# Patient Record
Sex: Female | Born: 1965 | Hispanic: No | Marital: Married | Smoking: Current every day smoker
Health system: Southern US, Community
[De-identification: ages and names within clinical notes are randomized; demographics above are authoritative.]

## PROBLEM LIST (undated history)

## (undated) DIAGNOSIS — D649 Anemia, unspecified: Secondary | ICD-10-CM

## (undated) DIAGNOSIS — N289 Disorder of kidney and ureter, unspecified: Secondary | ICD-10-CM

## (undated) DIAGNOSIS — Z87442 Personal history of urinary calculi: Secondary | ICD-10-CM

## (undated) DIAGNOSIS — R519 Headache, unspecified: Secondary | ICD-10-CM

## (undated) DIAGNOSIS — M797 Fibromyalgia: Secondary | ICD-10-CM

## (undated) DIAGNOSIS — T148XXA Other injury of unspecified body region, initial encounter: Secondary | ICD-10-CM

## (undated) DIAGNOSIS — Z8489 Family history of other specified conditions: Secondary | ICD-10-CM

## (undated) DIAGNOSIS — Z9889 Other specified postprocedural states: Secondary | ICD-10-CM

## (undated) DIAGNOSIS — E785 Hyperlipidemia, unspecified: Secondary | ICD-10-CM

## (undated) DIAGNOSIS — Z87440 Personal history of urinary (tract) infections: Secondary | ICD-10-CM

## (undated) DIAGNOSIS — J329 Chronic sinusitis, unspecified: Secondary | ICD-10-CM

## (undated) DIAGNOSIS — J189 Pneumonia, unspecified organism: Secondary | ICD-10-CM

## (undated) DIAGNOSIS — R51 Headache: Secondary | ICD-10-CM

## (undated) DIAGNOSIS — D499 Neoplasm of unspecified behavior of unspecified site: Secondary | ICD-10-CM

## (undated) DIAGNOSIS — G8929 Other chronic pain: Secondary | ICD-10-CM

## (undated) DIAGNOSIS — M549 Dorsalgia, unspecified: Secondary | ICD-10-CM

## (undated) DIAGNOSIS — K219 Gastro-esophageal reflux disease without esophagitis: Secondary | ICD-10-CM

## (undated) DIAGNOSIS — J45909 Unspecified asthma, uncomplicated: Secondary | ICD-10-CM

## (undated) DIAGNOSIS — F419 Anxiety disorder, unspecified: Secondary | ICD-10-CM

## (undated) DIAGNOSIS — K429 Umbilical hernia without obstruction or gangrene: Secondary | ICD-10-CM

## (undated) DIAGNOSIS — M1990 Unspecified osteoarthritis, unspecified site: Secondary | ICD-10-CM

## (undated) DIAGNOSIS — K589 Irritable bowel syndrome without diarrhea: Secondary | ICD-10-CM

## (undated) DIAGNOSIS — R112 Nausea with vomiting, unspecified: Secondary | ICD-10-CM

## (undated) DIAGNOSIS — R0600 Dyspnea, unspecified: Secondary | ICD-10-CM

## (undated) DIAGNOSIS — I10 Essential (primary) hypertension: Secondary | ICD-10-CM

## (undated) HISTORY — PX: CARPAL TUNNEL RELEASE: SHX101

## (undated) HISTORY — PX: CYSTECTOMY: SUR359

## (undated) HISTORY — PX: ABDOMINAL HYSTERECTOMY: SHX81

## (undated) HISTORY — PX: MULTIPLE TOOTH EXTRACTIONS: SHX2053

## (undated) HISTORY — PX: CHOLECYSTECTOMY: SHX55

## (undated) HISTORY — PX: LITHOTRIPSY: SUR834

## (undated) HISTORY — DX: Umbilical hernia without obstruction or gangrene: K42.9

## (undated) HISTORY — DX: Dorsalgia, unspecified: M54.9

---

## 1997-11-12 ENCOUNTER — Ambulatory Visit (HOSPITAL_COMMUNITY): Admission: RE | Admit: 1997-11-12 | Discharge: 1997-11-12 | Payer: Self-pay | Admitting: Urology

## 1999-02-04 ENCOUNTER — Emergency Department (HOSPITAL_COMMUNITY): Admission: EM | Admit: 1999-02-04 | Discharge: 1999-02-04 | Payer: Self-pay | Admitting: Emergency Medicine

## 1999-06-02 ENCOUNTER — Encounter: Admission: RE | Admit: 1999-06-02 | Discharge: 1999-08-31 | Payer: Self-pay | Admitting: Orthopedic Surgery

## 1999-09-03 ENCOUNTER — Emergency Department (HOSPITAL_COMMUNITY): Admission: EM | Admit: 1999-09-03 | Discharge: 1999-09-03 | Payer: Self-pay | Admitting: Emergency Medicine

## 1999-09-13 ENCOUNTER — Emergency Department (HOSPITAL_COMMUNITY): Admission: EM | Admit: 1999-09-13 | Discharge: 1999-09-13 | Payer: Self-pay | Admitting: Emergency Medicine

## 1999-09-13 ENCOUNTER — Encounter: Payer: Self-pay | Admitting: Emergency Medicine

## 1999-12-30 ENCOUNTER — Emergency Department (HOSPITAL_COMMUNITY): Admission: EM | Admit: 1999-12-30 | Discharge: 1999-12-30 | Payer: Self-pay

## 2000-07-07 ENCOUNTER — Emergency Department (HOSPITAL_COMMUNITY): Admission: EM | Admit: 2000-07-07 | Discharge: 2000-07-07 | Payer: Self-pay

## 2000-09-30 ENCOUNTER — Emergency Department (HOSPITAL_COMMUNITY): Admission: EM | Admit: 2000-09-30 | Discharge: 2000-09-30 | Payer: Self-pay | Admitting: *Deleted

## 2000-09-30 ENCOUNTER — Encounter: Payer: Self-pay | Admitting: Emergency Medicine

## 2001-01-23 ENCOUNTER — Emergency Department (HOSPITAL_COMMUNITY): Admission: EM | Admit: 2001-01-23 | Discharge: 2001-01-23 | Payer: Self-pay

## 2001-02-01 ENCOUNTER — Encounter: Payer: Self-pay | Admitting: Emergency Medicine

## 2001-02-01 ENCOUNTER — Emergency Department (HOSPITAL_COMMUNITY): Admission: EM | Admit: 2001-02-01 | Discharge: 2001-02-01 | Payer: Self-pay | Admitting: Emergency Medicine

## 2002-01-26 ENCOUNTER — Emergency Department (HOSPITAL_COMMUNITY): Admission: EM | Admit: 2002-01-26 | Discharge: 2002-01-26 | Payer: Self-pay | Admitting: *Deleted

## 2002-01-31 ENCOUNTER — Encounter: Admission: RE | Admit: 2002-01-31 | Discharge: 2002-01-31 | Payer: Self-pay | Admitting: *Deleted

## 2002-02-01 ENCOUNTER — Ambulatory Visit (HOSPITAL_COMMUNITY): Admission: RE | Admit: 2002-02-01 | Discharge: 2002-02-01 | Payer: Self-pay | Admitting: *Deleted

## 2002-02-03 ENCOUNTER — Ambulatory Visit (HOSPITAL_COMMUNITY): Admission: RE | Admit: 2002-02-03 | Discharge: 2002-02-03 | Payer: Self-pay

## 2002-02-03 ENCOUNTER — Encounter: Payer: Self-pay | Admitting: *Deleted

## 2002-02-21 ENCOUNTER — Encounter: Admission: RE | Admit: 2002-02-21 | Discharge: 2002-02-21 | Payer: Self-pay | Admitting: Internal Medicine

## 2002-02-27 ENCOUNTER — Encounter: Admission: RE | Admit: 2002-02-27 | Discharge: 2002-05-28 | Payer: Self-pay | Admitting: *Deleted

## 2002-03-01 ENCOUNTER — Encounter: Admission: RE | Admit: 2002-03-01 | Discharge: 2002-03-01 | Payer: Self-pay | Admitting: Internal Medicine

## 2002-03-10 ENCOUNTER — Observation Stay (HOSPITAL_COMMUNITY): Admission: RE | Admit: 2002-03-10 | Discharge: 2002-03-11 | Payer: Self-pay

## 2002-03-10 ENCOUNTER — Encounter (INDEPENDENT_AMBULATORY_CARE_PROVIDER_SITE_OTHER): Payer: Self-pay | Admitting: Specialist

## 2002-03-15 ENCOUNTER — Ambulatory Visit (HOSPITAL_COMMUNITY): Admission: RE | Admit: 2002-03-15 | Discharge: 2002-03-15 | Payer: Self-pay | Admitting: Internal Medicine

## 2002-03-15 ENCOUNTER — Encounter: Admission: RE | Admit: 2002-03-15 | Discharge: 2002-03-15 | Payer: Self-pay | Admitting: Internal Medicine

## 2002-03-22 ENCOUNTER — Emergency Department (HOSPITAL_COMMUNITY): Admission: EM | Admit: 2002-03-22 | Discharge: 2002-03-23 | Payer: Self-pay | Admitting: Emergency Medicine

## 2002-03-22 ENCOUNTER — Encounter: Payer: Self-pay | Admitting: Emergency Medicine

## 2002-03-23 ENCOUNTER — Encounter: Admission: RE | Admit: 2002-03-23 | Discharge: 2002-03-23 | Payer: Self-pay | Admitting: Internal Medicine

## 2002-03-26 ENCOUNTER — Emergency Department (HOSPITAL_COMMUNITY): Admission: EM | Admit: 2002-03-26 | Discharge: 2002-03-27 | Payer: Self-pay | Admitting: Emergency Medicine

## 2002-03-27 ENCOUNTER — Encounter: Payer: Self-pay | Admitting: Emergency Medicine

## 2002-04-22 ENCOUNTER — Emergency Department (HOSPITAL_COMMUNITY): Admission: EM | Admit: 2002-04-22 | Discharge: 2002-04-23 | Payer: Self-pay | Admitting: Emergency Medicine

## 2002-04-23 ENCOUNTER — Encounter: Payer: Self-pay | Admitting: Emergency Medicine

## 2002-04-24 ENCOUNTER — Emergency Department (HOSPITAL_COMMUNITY): Admission: EM | Admit: 2002-04-24 | Discharge: 2002-04-25 | Payer: Self-pay | Admitting: Emergency Medicine

## 2002-04-25 ENCOUNTER — Encounter: Payer: Self-pay | Admitting: Emergency Medicine

## 2002-04-26 ENCOUNTER — Inpatient Hospital Stay (HOSPITAL_COMMUNITY): Admission: AD | Admit: 2002-04-26 | Discharge: 2002-04-30 | Payer: Self-pay | Admitting: Internal Medicine

## 2002-04-26 ENCOUNTER — Ambulatory Visit (HOSPITAL_COMMUNITY): Admission: RE | Admit: 2002-04-26 | Discharge: 2002-04-26 | Payer: Self-pay | Admitting: *Deleted

## 2002-04-26 ENCOUNTER — Encounter: Admission: RE | Admit: 2002-04-26 | Discharge: 2002-04-26 | Payer: Self-pay | Admitting: Internal Medicine

## 2002-04-26 ENCOUNTER — Encounter: Payer: Self-pay | Admitting: *Deleted

## 2002-05-03 ENCOUNTER — Inpatient Hospital Stay (HOSPITAL_COMMUNITY): Admission: EM | Admit: 2002-05-03 | Discharge: 2002-05-06 | Payer: Self-pay | Admitting: Emergency Medicine

## 2002-05-03 ENCOUNTER — Encounter: Payer: Self-pay | Admitting: Internal Medicine

## 2002-05-05 ENCOUNTER — Encounter: Payer: Self-pay | Admitting: Internal Medicine

## 2002-05-11 ENCOUNTER — Encounter: Admission: RE | Admit: 2002-05-11 | Discharge: 2002-05-11 | Payer: Self-pay | Admitting: Internal Medicine

## 2002-06-08 ENCOUNTER — Encounter: Admission: RE | Admit: 2002-06-08 | Discharge: 2002-06-08 | Payer: Self-pay | Admitting: Internal Medicine

## 2002-06-12 ENCOUNTER — Encounter: Admission: RE | Admit: 2002-06-12 | Discharge: 2002-06-12 | Payer: Self-pay | Admitting: Internal Medicine

## 2002-06-12 ENCOUNTER — Inpatient Hospital Stay (HOSPITAL_COMMUNITY): Admission: AD | Admit: 2002-06-12 | Discharge: 2002-06-13 | Payer: Self-pay | Admitting: Internal Medicine

## 2002-06-12 ENCOUNTER — Ambulatory Visit (HOSPITAL_COMMUNITY): Admission: RE | Admit: 2002-06-12 | Discharge: 2002-06-12 | Payer: Self-pay | Admitting: Internal Medicine

## 2002-06-12 ENCOUNTER — Encounter: Payer: Self-pay | Admitting: Internal Medicine

## 2002-06-15 ENCOUNTER — Inpatient Hospital Stay (HOSPITAL_COMMUNITY): Admission: RE | Admit: 2002-06-15 | Discharge: 2002-06-15 | Payer: Self-pay | Admitting: Internal Medicine

## 2002-06-15 ENCOUNTER — Encounter: Admission: RE | Admit: 2002-06-15 | Discharge: 2002-06-15 | Payer: Self-pay | Admitting: Internal Medicine

## 2002-06-15 ENCOUNTER — Encounter: Payer: Self-pay | Admitting: Internal Medicine

## 2002-07-15 ENCOUNTER — Emergency Department (HOSPITAL_COMMUNITY): Admission: EM | Admit: 2002-07-15 | Discharge: 2002-07-15 | Payer: Self-pay | Admitting: Emergency Medicine

## 2002-07-16 ENCOUNTER — Encounter: Payer: Self-pay | Admitting: Emergency Medicine

## 2002-07-17 ENCOUNTER — Encounter: Payer: Self-pay | Admitting: Urology

## 2002-07-17 ENCOUNTER — Inpatient Hospital Stay (HOSPITAL_COMMUNITY): Admission: EM | Admit: 2002-07-17 | Discharge: 2002-07-20 | Payer: Self-pay | Admitting: Urology

## 2002-07-21 ENCOUNTER — Ambulatory Visit (HOSPITAL_COMMUNITY): Admission: RE | Admit: 2002-07-21 | Discharge: 2002-07-21 | Payer: Self-pay | Admitting: Internal Medicine

## 2002-07-21 ENCOUNTER — Inpatient Hospital Stay (HOSPITAL_COMMUNITY): Admission: EM | Admit: 2002-07-21 | Discharge: 2002-07-29 | Payer: Self-pay | Admitting: Internal Medicine

## 2002-07-21 ENCOUNTER — Encounter: Admission: RE | Admit: 2002-07-21 | Discharge: 2002-07-21 | Payer: Self-pay | Admitting: Internal Medicine

## 2002-07-23 ENCOUNTER — Encounter: Payer: Self-pay | Admitting: Internal Medicine

## 2002-07-26 ENCOUNTER — Encounter: Payer: Self-pay | Admitting: Internal Medicine

## 2002-07-28 ENCOUNTER — Encounter: Payer: Self-pay | Admitting: Internal Medicine

## 2002-07-29 ENCOUNTER — Encounter: Payer: Self-pay | Admitting: Internal Medicine

## 2002-08-10 DIAGNOSIS — R112 Nausea with vomiting, unspecified: Secondary | ICD-10-CM

## 2002-08-10 DIAGNOSIS — Z9889 Other specified postprocedural states: Secondary | ICD-10-CM

## 2002-08-10 HISTORY — DX: Other specified postprocedural states: Z98.890

## 2002-08-10 HISTORY — DX: Nausea with vomiting, unspecified: R11.2

## 2002-08-12 ENCOUNTER — Emergency Department (HOSPITAL_COMMUNITY): Admission: EM | Admit: 2002-08-12 | Discharge: 2002-08-13 | Payer: Self-pay | Admitting: Emergency Medicine

## 2002-08-13 ENCOUNTER — Encounter: Payer: Self-pay | Admitting: Emergency Medicine

## 2002-08-17 ENCOUNTER — Inpatient Hospital Stay (HOSPITAL_COMMUNITY): Admission: EM | Admit: 2002-08-17 | Discharge: 2002-08-21 | Payer: Self-pay | Admitting: Emergency Medicine

## 2002-08-22 ENCOUNTER — Encounter: Payer: Self-pay | Admitting: Emergency Medicine

## 2002-08-22 ENCOUNTER — Emergency Department (HOSPITAL_COMMUNITY): Admission: EM | Admit: 2002-08-22 | Discharge: 2002-08-23 | Payer: Self-pay | Admitting: Emergency Medicine

## 2002-08-25 ENCOUNTER — Encounter: Payer: Self-pay | Admitting: Emergency Medicine

## 2002-08-25 ENCOUNTER — Emergency Department (HOSPITAL_COMMUNITY): Admission: EM | Admit: 2002-08-25 | Discharge: 2002-08-25 | Payer: Self-pay | Admitting: Emergency Medicine

## 2002-09-10 ENCOUNTER — Emergency Department (HOSPITAL_COMMUNITY): Admission: EM | Admit: 2002-09-10 | Discharge: 2002-09-10 | Payer: Self-pay | Admitting: Emergency Medicine

## 2002-09-10 ENCOUNTER — Encounter: Payer: Self-pay | Admitting: Emergency Medicine

## 2002-09-12 ENCOUNTER — Encounter: Admission: RE | Admit: 2002-09-12 | Discharge: 2002-09-12 | Payer: Self-pay | Admitting: Obstetrics and Gynecology

## 2002-09-12 ENCOUNTER — Emergency Department (HOSPITAL_COMMUNITY): Admission: EM | Admit: 2002-09-12 | Discharge: 2002-09-13 | Payer: Self-pay | Admitting: Emergency Medicine

## 2002-10-04 ENCOUNTER — Encounter: Payer: Self-pay | Admitting: Obstetrics & Gynecology

## 2002-10-04 ENCOUNTER — Ambulatory Visit (HOSPITAL_COMMUNITY): Admission: RE | Admit: 2002-10-04 | Discharge: 2002-10-04 | Payer: Self-pay | Admitting: Obstetrics & Gynecology

## 2002-12-02 ENCOUNTER — Emergency Department (HOSPITAL_COMMUNITY): Admission: EM | Admit: 2002-12-02 | Discharge: 2002-12-02 | Payer: Self-pay | Admitting: Emergency Medicine

## 2002-12-12 ENCOUNTER — Inpatient Hospital Stay (HOSPITAL_COMMUNITY): Admission: AD | Admit: 2002-12-12 | Discharge: 2002-12-12 | Payer: Self-pay | Admitting: Obstetrics & Gynecology

## 2002-12-12 ENCOUNTER — Encounter: Payer: Self-pay | Admitting: Obstetrics & Gynecology

## 2002-12-13 ENCOUNTER — Encounter: Payer: Self-pay | Admitting: Emergency Medicine

## 2002-12-13 ENCOUNTER — Emergency Department (HOSPITAL_COMMUNITY): Admission: EM | Admit: 2002-12-13 | Discharge: 2002-12-13 | Payer: Self-pay | Admitting: Emergency Medicine

## 2002-12-14 ENCOUNTER — Emergency Department (HOSPITAL_COMMUNITY): Admission: EM | Admit: 2002-12-14 | Discharge: 2002-12-14 | Payer: Self-pay | Admitting: Emergency Medicine

## 2003-04-01 ENCOUNTER — Emergency Department (HOSPITAL_COMMUNITY): Admission: EM | Admit: 2003-04-01 | Discharge: 2003-04-01 | Payer: Self-pay | Admitting: Emergency Medicine

## 2003-05-09 ENCOUNTER — Emergency Department (HOSPITAL_COMMUNITY): Admission: EM | Admit: 2003-05-09 | Discharge: 2003-05-09 | Payer: Self-pay | Admitting: Emergency Medicine

## 2003-05-09 ENCOUNTER — Encounter: Payer: Self-pay | Admitting: Emergency Medicine

## 2003-05-15 ENCOUNTER — Encounter: Payer: Self-pay | Admitting: Obstetrics

## 2003-05-15 ENCOUNTER — Inpatient Hospital Stay (HOSPITAL_COMMUNITY): Admission: AD | Admit: 2003-05-15 | Discharge: 2003-05-17 | Payer: Self-pay | Admitting: Obstetrics

## 2003-05-18 ENCOUNTER — Emergency Department (HOSPITAL_COMMUNITY): Admission: EM | Admit: 2003-05-18 | Discharge: 2003-05-18 | Payer: Self-pay | Admitting: Emergency Medicine

## 2003-05-29 ENCOUNTER — Emergency Department (HOSPITAL_COMMUNITY): Admission: EM | Admit: 2003-05-29 | Discharge: 2003-05-30 | Payer: Self-pay | Admitting: Emergency Medicine

## 2003-05-31 ENCOUNTER — Encounter: Payer: Self-pay | Admitting: Emergency Medicine

## 2003-05-31 ENCOUNTER — Emergency Department (HOSPITAL_COMMUNITY): Admission: EM | Admit: 2003-05-31 | Discharge: 2003-05-31 | Payer: Self-pay | Admitting: Emergency Medicine

## 2003-06-01 ENCOUNTER — Emergency Department (HOSPITAL_COMMUNITY): Admission: EM | Admit: 2003-06-01 | Discharge: 2003-06-01 | Payer: Self-pay | Admitting: Emergency Medicine

## 2003-06-01 ENCOUNTER — Encounter: Payer: Self-pay | Admitting: Emergency Medicine

## 2003-06-03 ENCOUNTER — Emergency Department (HOSPITAL_COMMUNITY): Admission: EM | Admit: 2003-06-03 | Discharge: 2003-06-03 | Payer: Self-pay | Admitting: Emergency Medicine

## 2003-06-03 ENCOUNTER — Encounter: Payer: Self-pay | Admitting: Emergency Medicine

## 2003-06-05 ENCOUNTER — Inpatient Hospital Stay (HOSPITAL_COMMUNITY): Admission: EM | Admit: 2003-06-05 | Discharge: 2003-06-09 | Payer: Self-pay | Admitting: Emergency Medicine

## 2003-07-28 ENCOUNTER — Emergency Department (HOSPITAL_COMMUNITY): Admission: EM | Admit: 2003-07-28 | Discharge: 2003-07-28 | Payer: Self-pay | Admitting: Emergency Medicine

## 2003-09-15 ENCOUNTER — Emergency Department (HOSPITAL_COMMUNITY): Admission: EM | Admit: 2003-09-15 | Discharge: 2003-09-15 | Payer: Self-pay | Admitting: Emergency Medicine

## 2003-10-09 ENCOUNTER — Emergency Department (HOSPITAL_COMMUNITY): Admission: EM | Admit: 2003-10-09 | Discharge: 2003-10-09 | Payer: Self-pay | Admitting: Emergency Medicine

## 2003-10-10 ENCOUNTER — Emergency Department (HOSPITAL_COMMUNITY): Admission: EM | Admit: 2003-10-10 | Discharge: 2003-10-10 | Payer: Self-pay | Admitting: Emergency Medicine

## 2003-10-11 ENCOUNTER — Emergency Department (HOSPITAL_COMMUNITY): Admission: EM | Admit: 2003-10-11 | Discharge: 2003-10-11 | Payer: Self-pay | Admitting: Emergency Medicine

## 2003-10-12 ENCOUNTER — Emergency Department (HOSPITAL_COMMUNITY): Admission: EM | Admit: 2003-10-12 | Discharge: 2003-10-13 | Payer: Self-pay | Admitting: *Deleted

## 2003-10-13 ENCOUNTER — Emergency Department (HOSPITAL_COMMUNITY): Admission: EM | Admit: 2003-10-13 | Discharge: 2003-10-13 | Payer: Self-pay | Admitting: Emergency Medicine

## 2003-10-14 ENCOUNTER — Inpatient Hospital Stay (HOSPITAL_COMMUNITY): Admission: EM | Admit: 2003-10-14 | Discharge: 2003-10-18 | Payer: Self-pay | Admitting: Emergency Medicine

## 2003-10-23 ENCOUNTER — Emergency Department (HOSPITAL_COMMUNITY): Admission: EM | Admit: 2003-10-23 | Discharge: 2003-10-23 | Payer: Self-pay | Admitting: Emergency Medicine

## 2003-10-24 ENCOUNTER — Emergency Department (HOSPITAL_COMMUNITY): Admission: EM | Admit: 2003-10-24 | Discharge: 2003-10-25 | Payer: Self-pay | Admitting: Emergency Medicine

## 2003-11-24 ENCOUNTER — Emergency Department (HOSPITAL_COMMUNITY): Admission: EM | Admit: 2003-11-24 | Discharge: 2003-11-24 | Payer: Self-pay | Admitting: Emergency Medicine

## 2003-11-27 ENCOUNTER — Inpatient Hospital Stay (HOSPITAL_COMMUNITY): Admission: AD | Admit: 2003-11-27 | Discharge: 2003-11-27 | Payer: Self-pay | Admitting: Obstetrics & Gynecology

## 2003-12-17 ENCOUNTER — Encounter: Admission: RE | Admit: 2003-12-17 | Discharge: 2003-12-17 | Payer: Self-pay | Admitting: Nephrology

## 2004-04-28 ENCOUNTER — Emergency Department (HOSPITAL_COMMUNITY): Admission: EM | Admit: 2004-04-28 | Discharge: 2004-04-29 | Payer: Self-pay | Admitting: Emergency Medicine

## 2004-07-09 ENCOUNTER — Ambulatory Visit (HOSPITAL_COMMUNITY): Admission: RE | Admit: 2004-07-09 | Discharge: 2004-07-09 | Payer: Self-pay | Admitting: Obstetrics & Gynecology

## 2004-08-14 ENCOUNTER — Ambulatory Visit (HOSPITAL_COMMUNITY): Admission: RE | Admit: 2004-08-14 | Discharge: 2004-08-14 | Payer: Self-pay | Admitting: Obstetrics & Gynecology

## 2004-09-13 ENCOUNTER — Emergency Department (HOSPITAL_COMMUNITY): Admission: EM | Admit: 2004-09-13 | Discharge: 2004-09-13 | Payer: Self-pay | Admitting: Emergency Medicine

## 2004-10-20 ENCOUNTER — Emergency Department (HOSPITAL_COMMUNITY): Admission: EM | Admit: 2004-10-20 | Discharge: 2004-10-20 | Payer: Self-pay | Admitting: Emergency Medicine

## 2004-10-30 ENCOUNTER — Inpatient Hospital Stay (HOSPITAL_COMMUNITY): Admission: AD | Admit: 2004-10-30 | Discharge: 2004-11-03 | Payer: Self-pay | Admitting: Obstetrics & Gynecology

## 2004-10-30 ENCOUNTER — Encounter (INDEPENDENT_AMBULATORY_CARE_PROVIDER_SITE_OTHER): Payer: Self-pay | Admitting: *Deleted

## 2004-11-05 ENCOUNTER — Inpatient Hospital Stay (HOSPITAL_COMMUNITY): Admission: AD | Admit: 2004-11-05 | Discharge: 2004-11-20 | Payer: Self-pay | Admitting: Obstetrics & Gynecology

## 2004-11-25 ENCOUNTER — Inpatient Hospital Stay (HOSPITAL_COMMUNITY): Admission: AD | Admit: 2004-11-25 | Discharge: 2004-11-30 | Payer: Self-pay | Admitting: Obstetrics

## 2005-03-14 ENCOUNTER — Emergency Department (HOSPITAL_COMMUNITY): Admission: EM | Admit: 2005-03-14 | Discharge: 2005-03-14 | Payer: Self-pay | Admitting: Emergency Medicine

## 2005-03-15 ENCOUNTER — Emergency Department (HOSPITAL_COMMUNITY): Admission: EM | Admit: 2005-03-15 | Discharge: 2005-03-15 | Payer: Self-pay | Admitting: Emergency Medicine

## 2005-03-16 ENCOUNTER — Inpatient Hospital Stay (HOSPITAL_COMMUNITY): Admission: AD | Admit: 2005-03-16 | Discharge: 2005-03-23 | Payer: Self-pay | Admitting: Internal Medicine

## 2005-05-31 ENCOUNTER — Emergency Department (HOSPITAL_COMMUNITY): Admission: EM | Admit: 2005-05-31 | Discharge: 2005-05-31 | Payer: Self-pay | Admitting: Family Medicine

## 2005-10-03 ENCOUNTER — Emergency Department (HOSPITAL_COMMUNITY): Admission: EM | Admit: 2005-10-03 | Discharge: 2005-10-04 | Payer: Self-pay | Admitting: Emergency Medicine

## 2006-01-07 ENCOUNTER — Encounter: Admission: RE | Admit: 2006-01-07 | Discharge: 2006-01-07 | Payer: Self-pay | Admitting: Sports Medicine

## 2006-02-23 ENCOUNTER — Emergency Department (HOSPITAL_COMMUNITY): Admission: EM | Admit: 2006-02-23 | Discharge: 2006-02-23 | Payer: Self-pay | Admitting: Emergency Medicine

## 2006-05-27 ENCOUNTER — Emergency Department (HOSPITAL_COMMUNITY): Admission: EM | Admit: 2006-05-27 | Discharge: 2006-05-27 | Payer: Self-pay | Admitting: Emergency Medicine

## 2006-10-09 ENCOUNTER — Emergency Department (HOSPITAL_COMMUNITY): Admission: EM | Admit: 2006-10-09 | Discharge: 2006-10-09 | Payer: Self-pay | Admitting: Emergency Medicine

## 2006-10-12 ENCOUNTER — Inpatient Hospital Stay (HOSPITAL_COMMUNITY): Admission: EM | Admit: 2006-10-12 | Discharge: 2006-10-19 | Payer: Self-pay | Admitting: Emergency Medicine

## 2006-10-18 ENCOUNTER — Ambulatory Visit: Payer: Self-pay | Admitting: Vascular Surgery

## 2006-10-18 ENCOUNTER — Ambulatory Visit: Payer: Self-pay | Admitting: Infectious Diseases

## 2007-05-26 ENCOUNTER — Emergency Department (HOSPITAL_COMMUNITY): Admission: EM | Admit: 2007-05-26 | Discharge: 2007-05-26 | Payer: Self-pay | Admitting: Emergency Medicine

## 2007-08-11 ENCOUNTER — Emergency Department (HOSPITAL_COMMUNITY): Admission: EM | Admit: 2007-08-11 | Discharge: 2007-08-11 | Payer: Self-pay | Admitting: Emergency Medicine

## 2007-09-24 ENCOUNTER — Emergency Department (HOSPITAL_COMMUNITY): Admission: EM | Admit: 2007-09-24 | Discharge: 2007-09-24 | Payer: Self-pay | Admitting: Emergency Medicine

## 2008-01-04 ENCOUNTER — Inpatient Hospital Stay (HOSPITAL_COMMUNITY): Admission: EM | Admit: 2008-01-04 | Discharge: 2008-01-07 | Payer: Self-pay | Admitting: Emergency Medicine

## 2008-05-09 ENCOUNTER — Emergency Department (HOSPITAL_COMMUNITY): Admission: EM | Admit: 2008-05-09 | Discharge: 2008-05-09 | Payer: Self-pay | Admitting: Emergency Medicine

## 2008-05-21 ENCOUNTER — Emergency Department (HOSPITAL_COMMUNITY): Admission: EM | Admit: 2008-05-21 | Discharge: 2008-05-21 | Payer: Self-pay | Admitting: Emergency Medicine

## 2008-07-03 ENCOUNTER — Ambulatory Visit (HOSPITAL_COMMUNITY): Admission: RE | Admit: 2008-07-03 | Discharge: 2008-07-03 | Payer: Self-pay | Admitting: Specialist

## 2008-07-10 ENCOUNTER — Emergency Department (HOSPITAL_COMMUNITY): Admission: EM | Admit: 2008-07-10 | Discharge: 2008-07-10 | Payer: Self-pay | Admitting: Emergency Medicine

## 2008-09-20 ENCOUNTER — Ambulatory Visit (HOSPITAL_COMMUNITY): Admission: RE | Admit: 2008-09-20 | Discharge: 2008-09-20 | Payer: Self-pay | Admitting: Specialist

## 2009-04-08 ENCOUNTER — Encounter: Admission: RE | Admit: 2009-04-08 | Discharge: 2009-04-08 | Payer: Self-pay | Admitting: Specialist

## 2009-12-21 ENCOUNTER — Emergency Department (HOSPITAL_COMMUNITY): Admission: EM | Admit: 2009-12-21 | Discharge: 2009-12-21 | Payer: Self-pay | Admitting: Emergency Medicine

## 2010-08-31 ENCOUNTER — Encounter: Payer: Self-pay | Admitting: Specialist

## 2010-09-01 ENCOUNTER — Encounter: Payer: Self-pay | Admitting: Specialist

## 2010-10-28 LAB — DIFFERENTIAL
Basophils Relative: 1 % (ref 0–1)
Eosinophils Absolute: 1 10*3/uL — ABNORMAL HIGH (ref 0.0–0.7)
Lymphs Abs: 1.9 10*3/uL (ref 0.7–4.0)
Monocytes Relative: 4 % (ref 3–12)
Neutro Abs: 3.8 10*3/uL (ref 1.7–7.7)
Neutrophils Relative %: 54 % (ref 43–77)

## 2010-10-28 LAB — CBC
MCHC: 33 g/dL (ref 30.0–36.0)
MCV: 86.4 fL (ref 78.0–100.0)
Platelets: 272 10*3/uL (ref 150–400)
RBC: 3.73 MIL/uL — ABNORMAL LOW (ref 3.87–5.11)
WBC: 7.1 10*3/uL (ref 4.0–10.5)

## 2010-10-28 LAB — URINE MICROSCOPIC-ADD ON

## 2010-10-28 LAB — COMPREHENSIVE METABOLIC PANEL
ALT: 20 U/L (ref 0–35)
AST: 20 U/L (ref 0–37)
Albumin: 3 g/dL — ABNORMAL LOW (ref 3.5–5.2)
Alkaline Phosphatase: 70 U/L (ref 39–117)
Calcium: 8.4 mg/dL (ref 8.4–10.5)
GFR calc Af Amer: >60 mL/min (ref >60)
Glucose, Bld: 103 mg/dL — ABNORMAL HIGH (ref 70–99)
Potassium: 4.4 mEq/L (ref 3.5–5.1)
Sodium: 138 mEq/L (ref 135–145)
Total Protein: 6.2 g/dL (ref 6.0–8.3)

## 2010-10-28 LAB — URINALYSIS, ROUTINE W REFLEX MICROSCOPIC
Glucose, UA: NEGATIVE mg/dL
Hgb urine dipstick: NEGATIVE
Specific Gravity, Urine: 1.019 (ref 1.005–1.030)

## 2010-11-20 ENCOUNTER — Emergency Department (HOSPITAL_COMMUNITY): Payer: Medicare Other

## 2010-11-20 ENCOUNTER — Emergency Department (HOSPITAL_COMMUNITY)
Admission: EM | Admit: 2010-11-20 | Discharge: 2010-11-20 | Disposition: A | Discharge status: Home / Self Care | Payer: Medicare Other | Attending: Emergency Medicine | Admitting: Emergency Medicine

## 2010-11-20 DIAGNOSIS — Z87442 Personal history of urinary calculi: Secondary | ICD-10-CM | POA: Insufficient documentation

## 2010-11-20 DIAGNOSIS — R109 Unspecified abdominal pain: Secondary | ICD-10-CM | POA: Insufficient documentation

## 2010-11-20 DIAGNOSIS — R3129 Other microscopic hematuria: Secondary | ICD-10-CM | POA: Insufficient documentation

## 2010-11-20 DIAGNOSIS — K219 Gastro-esophageal reflux disease without esophagitis: Secondary | ICD-10-CM | POA: Insufficient documentation

## 2010-11-20 DIAGNOSIS — E119 Type 2 diabetes mellitus without complications: Secondary | ICD-10-CM | POA: Insufficient documentation

## 2010-11-20 DIAGNOSIS — I10 Essential (primary) hypertension: Secondary | ICD-10-CM | POA: Insufficient documentation

## 2010-11-20 DIAGNOSIS — N390 Urinary tract infection, site not specified: Secondary | ICD-10-CM | POA: Insufficient documentation

## 2010-11-20 LAB — URINALYSIS, ROUTINE W REFLEX MICROSCOPIC
Bilirubin Urine: NEGATIVE
Glucose, UA: NEGATIVE mg/dL
Ketones, ur: NEGATIVE mg/dL
Nitrite: NEGATIVE
Specific Gravity, Urine: 1.007 (ref 1.005–1.030)
pH: 6 (ref 5.0–8.0)

## 2010-11-20 LAB — BASIC METABOLIC PANEL
Chloride: 103 mEq/L (ref 96–112)
GFR calc non Af Amer: 57 mL/min — ABNORMAL LOW (ref >60)
Glucose, Bld: 120 mg/dL — ABNORMAL HIGH (ref 70–99)
Potassium: 4.2 mEq/L (ref 3.5–5.1)
Sodium: 139 mEq/L (ref 135–145)

## 2010-11-20 LAB — DIFFERENTIAL
Basophils Absolute: 0 10*3/uL (ref 0.0–0.1)
Eosinophils Relative: 12 % — ABNORMAL HIGH (ref 0–5)
Lymphocytes Relative: 13 % (ref 12–46)
Lymphs Abs: 0.7 10*3/uL (ref 0.7–4.0)
Neutro Abs: 3.9 10*3/uL (ref 1.7–7.7)
Neutrophils Relative %: 71 % (ref 43–77)

## 2010-11-20 LAB — URINE MICROSCOPIC-ADD ON

## 2010-11-20 LAB — CBC
HCT: 33.1 % — ABNORMAL LOW (ref 36.0–46.0)
MCV: 85.8 fL (ref 78.0–100.0)
RBC: 3.86 MIL/uL — ABNORMAL LOW (ref 3.87–5.11)
RDW: 14.8 % (ref 11.5–15.5)
WBC: 5.5 10*3/uL (ref 4.0–10.5)

## 2010-11-21 LAB — URINE CULTURE
Colony Count: 35000
Culture  Setup Time: 201204121414

## 2010-12-05 ENCOUNTER — Other Ambulatory Visit: Payer: Self-pay | Admitting: Orthopaedic Surgery

## 2010-12-05 DIAGNOSIS — M545 Low back pain, unspecified: Secondary | ICD-10-CM

## 2010-12-07 ENCOUNTER — Other Ambulatory Visit: Payer: Medicare Other

## 2010-12-19 ENCOUNTER — Emergency Department (HOSPITAL_COMMUNITY)
Admission: EM | Admit: 2010-12-19 | Discharge: 2010-12-19 | Disposition: A | Discharge status: Home / Self Care | Payer: Medicare Other | Attending: Emergency Medicine | Admitting: Emergency Medicine

## 2010-12-19 ENCOUNTER — Emergency Department (HOSPITAL_COMMUNITY): Payer: Medicare Other

## 2010-12-19 DIAGNOSIS — M545 Low back pain, unspecified: Secondary | ICD-10-CM | POA: Insufficient documentation

## 2010-12-19 DIAGNOSIS — M542 Cervicalgia: Secondary | ICD-10-CM | POA: Insufficient documentation

## 2010-12-19 DIAGNOSIS — Z87442 Personal history of urinary calculi: Secondary | ICD-10-CM | POA: Insufficient documentation

## 2010-12-19 DIAGNOSIS — E119 Type 2 diabetes mellitus without complications: Secondary | ICD-10-CM | POA: Insufficient documentation

## 2010-12-19 DIAGNOSIS — I10 Essential (primary) hypertension: Secondary | ICD-10-CM | POA: Insufficient documentation

## 2010-12-19 DIAGNOSIS — K219 Gastro-esophageal reflux disease without esophagitis: Secondary | ICD-10-CM | POA: Insufficient documentation

## 2010-12-19 DIAGNOSIS — Y9289 Other specified places as the place of occurrence of the external cause: Secondary | ICD-10-CM | POA: Insufficient documentation

## 2010-12-23 NOTE — H&P (Signed)
NAMEKYNDEL, EGGER NO.:  [id number]   MEDICAL RECORD NO.:  [id number]          PATIENT TYPE:  INP   LOCATION:  1519                         FACILITY:  [facility]   PHYSICIAN:  Lonia Blood, M.D.       DATE OF BIRTH:  1965/08/27   DATE OF ADMISSION:  01/04/2008  DATE OF DISCHARGE:                              HISTORY & PHYSICAL   PRIMARY CARE PHYSICIAN:  Dr. Ethlyn Gallery [facility].   CHIEF COMPLAINT:  Abdominal pain.   HISTORY OF PRESENT ILLNESS:  Mrs. Bubb is a 45 year old woman with  multiple medical problems, who came into the emergency room today after  about 48 hours of sharp left side abdominal pain and also some  suprapubic pain.  She reports that the pain is shooting at times.  She  says she has been having nausea, but no vomiting.  She denies any  diarrhea.  She denies any fever or chills.  She has had history of  nephrolithiasis, which has been quite recurrent and persistent, and also  she has had a history MRSA cellulitis of her eye.   PAST MEDICAL HISTORY:  1. Diabetes.  2. Asthma.  3. Gastroesophageal reflux disease.  4. Hyperlipidemia.  5. Hypertension.  6. Kidney stones.  7. Morbid obesity.  8. Hysterectomy.  9. MRSA septal cellulitis.  10.Cholecystectomy.   HOME MEDICATIONS:  1,  Actos 50 mg daily.  1. Lipitor 40 mg daily.  2. Lisinopril 40 mg daily.  3. Lyrica 75 mg at bedtime.  4. Metformin 1000 mg twice a day.  5. Lantus 30 units daily.   ALLERGIES:  1. DARVOCET.  2. PENICILLIN.   SOCIAL HISTORY:  The patient smokes half-pack cigarettes a day.  Lives  with spouse.  Does not drink alcohol.  Does not use any illicit drugs.  She is on disability second to her chronic back pain.   FAMILY HISTORY:  Positive for alcoholism.  Positive for coronary artery  disease, hypertension and CHF.   REVIEW OF SYSTEMS:  As per HPI.  All other systems reviewed and  negative.   PHYSICAL EXAMINATION:  VITAL SIGNS:   Upon admission, indicates a  temperature of 98.2, blood pressure 168/81, pulse 87, respirations 20,  saturation 98% on room air.  GENERAL APPEARANCE:  A morbidly obese woman in no acute distress,  sitting on a stretcher.  Alert and oriented to place, person and time.  HEENT:  Head normocephalic, atraumatic.  Eyes; pupils equal, round and  reactive to light and accommodation.  Extraocular movements intact.  Throat clear.  NECK:  Supple.  No JVD.  CHEST:  Clear to auscultation without wheezes, rhonchi or crackles.  HEART:  Regular rate and rhythm without murmurs, rubs or gallop.  ABDOMEN:  The patient's abdomen is obese.  There is some minimal  tenderness to the left flank.  There is also redness and erythema  surrounding the suprapubic area.  There is no fluid collection and no  clear pus draining.  LOWER EXTREMITIES:  Without edema.  NEUROLOGICAL:  Cranial nerves III through XII intact.  Strength 5/5 in  all four extremities.   LABORATORY DATA:  On admission, white blood cell count is 6.9,  hemoglobin is 11.7, platelet count 331, sodium 137, potassium 4.4,  chloride 102, bicarbonate 25, BUN 14, creatinine 0.7, albumin 3.4, AST  33, ALT 26, lipase 21.  Urinalysis is positive for leukocyte esterase,  red blood cells, a few epithelial cells.  CT scan of the abdomen and  pelvis indicates the presence of nephrolithiasis on the left,  nonobstructive, as well as subcutaneous inflammatory changes, consistent  with positive panniculitis.   ASSESSMENT/PLAN:  1. Cellulitis of the abdominal wall, which seems to be mild in nature,      but nevertheless, given this patient's history of methicillin      resistant Staphylococcus aureus, will proceed with admitting her to      the hospital and place her on intravenous vancomycin.  Further plan      of care based on the way she evolves.  She is currently not septic      and she should have a quick and good recovery and be retransitioned      to oral  antibiotics.  2. Urinary tract infection.  According to the patient, she has been      battling this for about a week now.  I will get a urine culture and      treat empirically with Rocephin for now.  3. Diabetes mellitus.  Will get a hemoglobin A1c to look for chronic      control and treat the patient with Lantus 30 units at bedtime and      sliding scale insulin.  I will hold her metformin because she got      intravenous contrast for the CT scan of the abdomen and pelvis.  4. Chronic back pain.  I am unsure how much this is the source of her      problem through radiating pain.  Her body habitus makes an MRI      quite difficult, so for now we will try to proceed with symptomatic      treatment to see if she gets better.  If she continues to have      significant pain, we will consider an open MRI of her lumbosacral      spine.  5. Deep venous thrombosis prophylaxis will be done using heparin      subcutaneously every 8 hours.      Lonia Blood, M.D.  Electronically Signed     SL/MEDQ  D:  01/04/2008  T:  01/04/2008  Job:  [id number]

## 2010-12-23 NOTE — Consult Note (Signed)
Gina [name], Gina [name] NO.:  [id number]   MEDICAL RECORD NO.:  [id number]          PATIENT TYPE:  EMS   LOCATION:  ED                           FACILITY:  [facility]   PHYSICIAN:  Peter M. [place]il[place], M.D.  DATE OF BIRTH:  10/29/1965   DATE OF CONSULTATION:  07/10/2008  DATE OF DISCHARGE:  07/10/2008                                 CONSULTATION   HISTORY OF PRESENT ILLNESS:  Gina [name] is a 45 year old white female I  was requested to see by Dr. Nicanor Alcon in the [facility]  for evaluation of chest pain.  The patient has a history of morbid  obesity, diabetes mellitus, hypercholesterolemia, hypertension.  She  presented to the emergency department today for evaluation of chest  pain.  She states she has had no prior history of chest pain or cardiac  disease.  Three days ago, she began experiencing chest pain localized to  her left anterior chest radiating through to her left shoulder blade.  She had no associated nausea, vomiting, diaphoresis or shortness of  breath.  Her pain is described as sharp, stabbing quality and  intermittent.  It is worse with certain positions, movement or bending.  She also noticed some increased pain with taking a deep breath.  She has  achieved some relief with Lortabs and Motrin at home.  She states she  got an acute pain when she rolled over in bed last night and so came to  the emergency room for evaluation.   PAST MEDICAL HISTORY:  1. The patient has a history of recurrent cellulitis with MRSA      involving her chin in 2006 and again of orbital cellulitis in March      2008.  2. She has a history of diabetes mellitus type 2, insulin requiring.  3. Hypertension.  4. Gastroesophageal reflux disease.  5. Hypercholesterolemia.  6. Nephrolithiasis.  7. Recurrent urinary tract infections.  8. History of ureteral stents for kidney stones.  9. History of lithotripsy.  10.Status post surgery for carpal tunnel syndrome.  11.Irritable bowel syndrome.  12.Previous cholecystectomy.  13.Previous C. section x2.  14.Previous hysterectomy.  15.Removal of a cyst on her left foot.  16.Chronic pelvic pain syndrome with history of bowel and omental      adhesions.   CURRENT MEDICATIONS:  1. Lisinopril 40 mg daily.  2. Actos 20 mg daily.  3. Lantus 40 units at bedtime.  4. Lipitor 40 mg per day.  5. Lyrica 100 mg twice a day.  6. Metformin 1000 mg twice a day.  7. Prilosec 20 mg daily.  8. Albuterol 2 puffs as needed.  9. Flexeril 10 mg p.r.n.  10.Klonopin 2 tablets daily.  11.Lortab 10/650 p.r.n.  12.The patient also reports she was treated with antibiotics for      urinary tract infection just 2 weeks ago.   ALLERGIES:  1. PENICILLIN.  2. DARVOCET.   SOCIAL HISTORY:  The patient is a smoker since age of 65, half pack per  day.  She denies any alcohol use.   FAMILY HISTORY:  Both parents were  alcoholics.  Father had a history of  heart problems and hypertension.  Mother had a history of cancer.   REVIEW OF SYSTEMS:  The patient reports being treated for an urinary  tract infection recently.  She also has undergone a CT scan of her  abdomen at [facility] for evaluation of an ovarian cyst.  She has  chronic abdominal and pelvic pain.  She reports a 100 pound weight loss  over the past year, currently weighing approximately 320 pounds.  She  has had no nausea or vomiting.  She denies any shortness of breath or  cough.  She has had no orthopnea, PND or increased edema.  There is no  history of TIA or stroke.  No history of bleeding disorder.  Bowel  movements have been normal.  All other systems were reviewed and are  negative.   PHYSICAL EXAMINATION:  GENERAL:  The patient is a morbidly obese white  female in no apparent distress.  VITAL SIGNS:  Blood pressure is 160/80, pulse is 90 and regular, sats  are 97% on room air, temperature is 97.7.  HEENT:  She is normocephalic, atraumatic.  Pupils  are equal, round and  reactive.  Sclerae are clear.  Oropharynx is clear.  NECK:  Without JVD, adenopathy, thyromegaly or bruits.  LUNGS:  Clear.  CARDIAC:  Reveals a regular rate and rhythm.  Normal S1-S2 without  gallop, murmur, rub or click.  Examination of the chest wall does  demonstrate tenderness to palpation  above the left pectoral region  which reproduces her pain.  ABDOMEN:  Morbidly obese, soft without acute tenderness.  Bowel sounds  are positive.  EXTREMITIES:  Without edema.  Pulses were palpable.  NEUROLOGIC:  The patient is alert and oriented x4.  Cranial nerves III-  XII are intact.  No gross motor or sensory deficit.   DIAGNOSTICS:  1. ECG shows normal sinus rhythm with normal ECG.  This is unchanged      from January 2009.  2. Her chest x-ray shows no active disease.   LABORATORY DATA:  White count 9000, hemoglobin 10.8, hematocrit 32.5,  platelets 230,000, sodium is 136, potassium 4.4, chloride 101, CO2 25,  BUN 17, creatinine 0.8, glucose 134.  LFTs and lipase are normal.  Urinalysis shows many epithelial cells and bacteria with 0-2 red cells  and 3-6 white cells per high-powered field.  D-dimer is 0.31, CK-MB was  7.5, troponin less than 0.05.   IMPRESSION:  1. Acute musculoskeletal chest pain confirmed by history and exam.  2. Diabetes mellitus type 2, insulin requiring.  3. Hypertension.  4. Morbid obesity.  5. Mild anemia.  6. History of dyslipidemia.   PLAN:  Point of care cardiac enzymes are pending.  If her second enzymes  are negative, would  recommend discharge to home and treatment with anti-  inflammatories such as Motrin for pain and rest.  The patient will  follow up with her regular MD.  I do not feel that this warrants further  cardiac evaluation at this time.  If she were to have refractory chest  pain after 2 weeks, then I could consider follow up stress testing as an  outpatient.           ______________________________  Peter M.  [place], M.D.     PMJ/MEDQ  D:  07/10/2008  T:  07/11/2008  Job:  [id number]   cc:   Mayford Knife, MD

## 2010-12-23 NOTE — Discharge Summary (Signed)
Gina [name], Gina [name] NO.:  [id number]   MEDICAL RECORD NO.:  [id number]          PATIENT TYPE:  INP   LOCATION:  1508                         FACILITY:  [facility]   PHYSICIAN:  Lonia Blood, M.D.       DATE OF BIRTH:  10-Feb-1966   DATE OF ADMISSION:  01/04/2008  DATE OF DISCHARGE:  01/07/2008                               DISCHARGE SUMMARY   PRIMARY CARE PHYSICIAN:  Ethlyn Gallery, M.D. with the [facility], phone number [phone].   DISCHARGE DIAGNOSES:  1. Abdominal wall cellulitis.  2. Candida intertrigo.  3. Urinary tract infection with low chronic left nephrolithiasis.  4. Morbid obesity.  5. Asthma.  6. Gastroesophageal reflux disease.  7. Hyperlipidemia.  8. Hypertension.  9. Diabetes.  10.Status post cholecystectomy.   DISCHARGE MEDICATIONS:  1. Actos 15 mg daily.  2. Lipitor 40 mg daily.  3. Lisinopril 40 mg daily.  4. Lyrica 75 mg at bedtime.  5. Metformin 1000 mg twice a day.  6. Lantus 30 units daily.  7. Septra double strength 1 tablet twice a day for 4 days.  8. Diflucan 100 mg daily for 5 days.  9. Percocet as needed for pain.   CONDITION ON DISCHARGE:  [name] was discharged in good condition.  She was instructed to follow up with her primary care physician within 1  week.   PROCEDURES DURING THIS ADMISSION:  On Jan 04, 2008 the patient underwent  a CT scan of abdomen and pelvis which was negative for acute  appendicitis, positive for a left nonobstructive staghorn type calculus  in the lower pole of the kidney.   CONSULTATIONS THIS ADMISSION:  No consultations obtained.   HISTORY AND PHYSICAL:  For the admission history and physical, refer to  the dictated H&P which was done by Dr. Lavera Guise on Jan 04, 2008.   HOSPITAL COURSE:  1. Acute cellulitis:  [name] was admitted with some abdominal wall      pain which was felt to be secondary to an episode of acute      cellulitis.  Upon treating this with intravenous  vancomycin and      intravenous fluconazole, the patient's cellulitis did improve      significantly.  She was transitioned to oral fluconazole and oral      Septra at the time of discharge and she is to complete 1 week of      antibiotics.  The patient was instructed to follow up with her      primary care physician for further treatments in regards to this      abdominal wall cellulitis.  [name] underwent a CT scan of her      abdomen which did not indicate the presence of any abscess in the      abdominal wall.  2. 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  Unfortunately, the urine      culture ordered upon admission was never collected.  [name]  received empiric Rocephin for 3 days and she was switched to 4 more      days of Septra at the time of discharge to treat this urinary tract      infection.  3. Diabetes mellitus:  We have measured hemoglobin A1c on [name]      and it came back at 8.1.  I have encouraged the patient to continue      her Lantus and metformin and Actos and observe a good low      carbohydrate diet.  4. Chronic pain:  [name] had been continued on Lyrica at bedtime      and we have also added Percocet as needed until the patient sees      her primary care physician back.      Lonia Blood, M.D.  Electronically Signed     SL/MEDQ  D:  01/07/2008  T:  01/07/2008  Job:  [id number]   cc:   Quentin Mulling, MD  Fax: [phone]

## 2010-12-26 NOTE — Discharge Summary (Signed)
NAME:  DEREON, WILLIAMSEN                        ACCOUNT NO.:  [id number]   MEDICAL RECORD NO.:  [id number]                   PATIENT TYPE:  INP   LOCATION:  5738                                 FACILITY:  [facility]   PHYSICIAN:  Talmage Coin, M.D.                  DATE OF BIRTH:  [birth year]   DATE OF ADMISSION:  04/26/2002  DATE OF DISCHARGE:  04/30/2002                                 DISCHARGE SUMMARY   CONSULTATIONS:  1. Sandria Bales. Ezzard Standing, M.D., general surgeon  2. Zigmund Daniel, M.D., surgeon.  3. Wilhemina Bonito. Marina Goodell, M.D., gastroenterologist.   DISCHARGE DIAGNOSES:  1. Nausea, vomiting, abdominal pain likely secondary to gastritis or peptic     ulcer disease.  2. Nephrolithiasis.  3. Diabetes.  4. Hypertension.  5. Hyperlipidemia.  6. History of migraine.  7. History of tobacco abuse.  8. Anemia likely secondary to gastritis.   DISCHARGE MEDICATIONS:  1. Glucophage 500 mg 1 p.o. q.a.m., 500 mg 2 p.o. q.h.s.  2. Lipitor 10 mg 1 p.o. q.d.  3. Lisinopril 10 mg 1 p.o. q.d.  4. Phenergan 25 mg 1 p.o. q.4-6h. p.r.n. nausea.  5. Helidac 1 p.o. q.i.d. x 2 weeks.  6. Protonix 40 mg 1 p.o. b.i.d.  7. Vicodin 5/500 one p.o. q.4-6h. p.r.n.  8. Reglan 10 mg 1 p.o. q.d.  9. Colace 100 mg 1 p.o. b.i.d.   DISPOSITION AND FOLLOW UP:  We will make a followup appointment for the  patient within the next two weeks with Dr. Viviann Spare and will call her with  that information.  If the patient continues to have symptoms at that time,  she will be scheduled for EGD, or if she has symptoms that continue to prior  to that appointment,she is to call, and will investigate with an EGD.   HISTORY OF PRESENT ILLNESS:  The patient is a 45 year old female status post  cholecystectomy 03/02/2002 who since that time has had multiple visits to the  outpatient clinic and the ER with nausea, vomiting, and abdominal pain.  Workup including CT most recently on 04/25/2002 have been negative.  The  patient says that  she gets something for the nausea and vomiting and  something for the pain and then gets sent home.  She usually ends up feeling  poorly again in one to two days.  Recently she has not been able to keep  anything down for the last several days.  She was unable to keep her  followup appointment with the surgeon, Dr. Orson Slick, due to attending to her  father at [facility]  She came in to the outpatient clinic on 04/26/2002.  There she threw up green bile and was unable to get comfortable.  She  reports low-grade fevers, no diarrhea, and has had good bowel movements.  The abdominal pain is epigastric and radiates down into the stomach and  around to the back.  PHYSICAL EXAMINATION:  Significant only for diffusely tender abdominal exam,  particularly in the epigastric region.   ADMISSION LABORATORY DATA:  White blood count 9.0, hemoglobin 11.7,  hematocrit 34.3, platelets 254.  Sodium134, potassium 3.7, chloride 101,  bicarb 24, BUN 7, creatinine 0.6. Calcium 8.8, bilirubin 0.5, alkaline  phosphatase 71, SGOT 23, SGPT 34, protein 6.7, albumin 3.4, lipase 15.   HOSPITAL COURSE:  1. NAUSEA, VOMITING, ABDOMINAL PAIN:  Over the past month, the patient has     undergone three CTs negative for any acute disease.  Ultrasound performed     on 04/26/2002 revealed only fatty liver and a normal common bile duct.     Lipase was within normal limits.  We consulted with gastroenterology.     They felt, due to her use of BC powders and NSAIDs, either gastritis or     peptic ulcer disease would most likely explain her symptoms.  They     further felt that treatment course would be the same regardless of     whether or not EGD was obtained and advised we treat her empirically.  If     symptoms continue, then obtain EGD.  She did have a Helicobacter pylori     antibody test which came back positive.  The patient was, therefore,     discharged on triple therapy Phenergan and Protonix and told to follow up      after the course of Helidac in order to see if her symptoms have     resolved.   1. ANEMIA:  The patient has a normocytic anemia that has been progressive     since her surgery for cholecystectomy on 03/10/2002.  Gastroenterology felt     this could also be explained by the diagnosis of gastritis or peptic     ulcer disease and can evaluate post treatments.   1. DIABETES:  Diagnosis 02/21/2002.  On admission, she had a hemoglobin A1C     of 10, known to have microalbuminuria. Cholesterol 348.  Her CBGs ranged     from 104 to 135 while hospitalized.  She will be discharged on     Glucophage.   1. HYPERTENSION:  The patient's blood pressure remained within normal limits     during hospital stay.  She was discharged on lisinopril.   1. HYPERLIPIDEMIA:  The patient was discharged on Lipitor.  Her last     cholesterol panel on 03/01/2002 was elevated with cholesterol 348,     triglycerides 663, and we did not investigate this any further.   1. PROTEINURIA.  The patient had a 24-hour urine protein of 722.  She     reports this having had this in the past and told this was due to the     chronic kidney stones that she has on the left side.   1. SMOKING:  The patient reports that she, in the past 10 months, started     smoking again due to the various stresses in her life.  She had quit     before that.  Will discuss smoking cessation on her followup appointment.   DISCHARGE LABORATORY DATA:  White blood count 6.5, hemoglobin 10.2,  hematocrit 30.3, platelets 205. Sodium 138, potassium 3.8, chloride 107,  bicarb 26, BUN 5, creatinine 0.6, glucose 116, calcium 8.1.  She also had a  TSH on 04/27/2002 of 1.308.  Her stool guaiacs were negative.      Glenice Bow, MS4  Talmage Coin, M.D.    KG/MEDQ  D:  04/30/2002  T:  05/03/2002  Job:  [id number]   cc:   Dr. Godfrey Pick, M.D. [address] [place], [place] [address]  Fax: [phone]   Sandria Bales. Ezzard Standing, M.D.   Fax: [phone]   Skeet Simmer., M.D.  Fax: [phone]   Wilhemina Bonito. Eda Keys., M.D. [facility]

## 2010-12-26 NOTE — H&P (Signed)
NAMERAJANAE, MANTIA NO.:  [id number]   MEDICAL RECORD NO.:  [id number]          PATIENT TYPE:  EMS   LOCATION:  ED                           FACILITY:  [facility]   PHYSICIAN:  Michaelyn Barter, M.D. DATE OF BIRTH:  September 10, 1965   DATE OF ADMISSION:  10/11/2006  DATE OF DISCHARGE:                              HISTORY & PHYSICAL   PRIMARY CARE PHYSICIAN:  Unassigned.  She sees Dr. Esperanza Richters at the  [facility] on [facility].   CHIEF COMPLAINT:  Right eye pain.   HISTORY OF PRESENT ILLNESS:  Ms. Stenberg is a 45 year old female with a  past medical history of hypertension, diabetes mellitus, and  hyperlipidemia who states that she developed a small bump near the  corner of her right lower eye lid near the nasal area.  This occurred  Thursday, which is 5 days ago.  The very next day, Friday, the bump  enlarged and by 2:30 a.m. the next day she was awakened with a severe  amount of pain coming from her right eye.  She came to [facility] ER to be evaluated.  A CAT scan was completed at that  time.  She was started on IV antibiotics, provided with pain  medications, and subsequently discharged home after treatment in the ER.   DISCHARGE MEDICATIONS:  1. Percocet.  2. Doxycycline.  3. Avelox.   Since leaving the ER, her eye has become even more swollen and painful.  The pain travels from her eye toward the frontoparietal region of her  head and down the maxillary region of her face to involve the lower  region of her face/jaw.  She had at least six episodes of emesis  yesterday.  She has also had some subjective fevers accompanied by  chills and nausea.  She denies having any prior eye trauma.  She stated  that she has never had anything like this before.  She denies having any  trauma to her face prior to this.   ALLERGIES:  PENICILLIN produces hives and DARVOCET produces hives.   PAST MEDICAL HISTORY:  1. Cellulitis  secondary to MRSA involving her chin in 2006.  2. Diabetes mellitus type 2.  3. Hypertension.  4. Reflux.  5. Hypercholesterolemia.  6. Nephrolithiasis.  7. Urinary tract infection secondary to Escherichia coli.  8. Adenomyosis.  9. Urology stents placed in the past secondary to kidney stones.  10.Lithotripsy.  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  12.Irritable bowel syndrome.   PAST SURGICAL HISTORY:  1. Cholecystectomy.  2. Cesarean sections x2.  3. Hysterectomy.  The patient still has her left ovary present.  This      was secondary to heavy menstrual periods and pain.  4. Left foot cyst removal.  5. Carpal tunnel syndrome surgery.  6. Cystoscopy and bilateral retrograde pyelograms with interpretation      completed by Maretta Bees. Vonita Moss, M.D. on July 19, 2002.  7. Chronic pelvic pain syndrome, bilateral hydrosalpinges, bowel and      omental adhesions.  The patient underwent a total abdominal  hysterectomy and bilateral salpingectomies with lysis of adhesions      done by Roseanna Rainbow, M.D. on October 20, 2004.   CURRENT MEDICATIONS:  1. Lipitor 80 mg daily.  2. Lisinopril 40 mg daily.  3. Lantus Insulin 100 units q.h.s.  4. Protonix 40 mg daily.  5. Actos, the patient does not remember the dose.   FAMILY HISTORY:  Mother and father were both alcoholics.  Mother had a  history of cancer.  There is also a family history of heart attacks and  strokes.  Father had history of hypertension, heart problems,  Legionaries' disease.   SOCIAL HISTORY:  Cigarettes; positive.  The patient started smoking at  the age of 21.  She currently smokes 1/2 pack a day.  Alcohol; the  patient denies.   REVIEW OF SYSTEMS:  No BM changes, no shortness of breath.   PHYSICAL EXAMINATION:  GENERAL:  The patient is awake, she is  cooperative.  VITAL SIGNS:  Temperature is 98.6, heart rate 108, blood pressure  133/76, respirations 20, and O2 saturation is 96% on  room air.  HEENT:  The right eye is significantly swollen.  This involves both the  upper and the lower eye lid.  Her eye appears to be very erythematous in  color.  Normocephalic and atraumatic.  Right conjunctivae is very  erythematous.  The entire right side of the patient's face is slightly  erythematous in color.  There is a small macular area below the right  eye with a small amount of discharge.  The oral mucosa is pink, no  thrush, no exudates.  NECK:  Supple.  Some anterior cervical/submandibular tenderness to the  right side of the patient's neck.  HEART:  S1 and S2 present, regular rate and rhythm.  LUNGS:  Clear.  ABDOMEN:  Soft, nontender, nondistended.  Positive bowel sounds.  No  masses are palpated.  EXTREMITIES:  Trace bilateral pitting edema.  NEUROLOGY:  The patient is alert and oriented x3.  MUSCULOSKELETAL:  5/5 upper and lower extremity strength.   LABORATORY DATA:  White blood cell count is 14.3, hemoglobin 11.5,  hematocrit 34.2, platelets 368.  Sodium 135, potassium 3.6, chloride 99,  CO2 27, BUN 9, creatinine 0.70, glucose 228, calcium 9.0.   The patient had a CAT scan completed on March 1, of her right orbit.  It  revealed preseptal right orbital cellulitis.  The intraconal fat  appeared to be spared.   ASSESSMENT:  1. Preseptal right orbital cellulitis.  The patient has failed      outpatient therapy.  Therefore we will initiate empiric IV      antibiotics, we will provide p.r.n. pain medications, we will      consider an ophthalmology consult and we will also attempt to      culture any discharge from the patient's eye.  2. Leukocytosis.  This is most likely associated with #1.  3. Hypertension.      Michaelyn Barter, M.D.  Electronically Signed     OR/MEDQ  D:  10/11/2006  T:  10/12/2006  Job:  [id number]

## 2010-12-26 NOTE — Discharge Summary (Signed)
NAME:  Gina [name], Gina [name]                        ACCOUNT NO.:  [id number]   MEDICAL RECORD NO.:  [id number]                   PATIENT TYPE:  INP   LOCATION:  0466                                 FACILITY:  [facility]   PHYSICIAN:  Fleet Contras, M.D.                 DATE OF BIRTH:  1966-05-07   DATE OF ADMISSION:  10/14/2003  DATE OF DISCHARGE:  10/18/2003                                 DISCHARGE SUMMARY   ADMITTING PHYSICIAN:  Fleet Contras, M.D.   PRESENTATION:  Ms. [name] is a 45 year old Caucasian lady admitted  via the emergency room at [facility] by Dr. Jeri Cos who  was on call for my service. She presented with five to six days history of  progressively worsening nausea, vomiting, diarrhea, and abdominal pain. She  has had multiple episodes of similar pain in the past requiring multiple  hospitalizations. Due to her persistent symptoms and inability to keep her  food and drink down, she was therefore hospitalized for further evaluation  and treatment.   HOSPITAL COURSE:  On admission the patient was started on intravenous  fluids, intravenous analgesics for some relief. She also received Phenergan  intravenously for nausea and vomiting. Her symptoms improved rapidly. Her  blood pressure was poorly controlled initially as well as her diabetes. She  received sliding scale Novolog for __________ protocol. She also had  episodes of low potassium which were repleted both intravenously and orally.  She was eventually able to tolerate oral intake. At the time of discharge  she had no further nausea, vomiting, or abdominal pain.   DISCHARGE DIAGNOSES:  1. Hypopotassemia due to persistent vomiting.  2. Chronic abdominal pain most likely due to irritable bowel syndrome.  3. Uncontrolled type 2 diabetes mellitus.  4. Uncontrolled hypertension.  5. Morbid obesity.   DISCHARGE MEDICATIONS:  1. Phenergan 25 mg one q.6h. p.r.n.  2. Vicodin 5/500 one q.6h. p.r.n.  3. Protonix 40 mg once daily.  4. Lisinopril 10 mg daily.  5. Amaryl 4 mg daily.  6. Lantus insulin 10 units q.h.s.  7. She was advised to stop using Glucophage which may cause her abdominal     cramping and diarrhea.   DISPOSITION:  Home.   She is to continue a 2-gm sodium, 1800 kilocalorie, lactose-free diet.   She is to follow up with Dr. Fleet Contras in two weeks and Dr. Juanda Chance,  gastroenterologist, on October 23, 2003.                                               Fleet Contras, M.D.    EA/MEDQ  D:  10/27/2003  T:  10/28/2003  Job:  [id number]

## 2010-12-26 NOTE — Discharge Summary (Signed)
NAME:  Gina [name], Gina [name]                        ACCOUNT NO.:  [id number]   MEDICAL RECORD NO.:  [id number]                   PATIENT TYPE:  INP   LOCATION:  3034                                 FACILITY:  [facility]   PHYSICIAN:  Gina Coin, MD                    DATE OF BIRTH:  [birth year]   DATE OF ADMISSION:  05/03/2002  DATE OF DISCHARGE:  05/06/2002                                 DISCHARGE SUMMARY   DISCHARGE DIAGNOSES:  1. Gastroesophageal reflux disease.  2. Nephrolithiasis.  3. Diabetes type 2.  4. Hypertension.  5. Hyperlipidemia.  6. History of migraines.  7. Tobacco abuse.  8. Anemia, likely secondary to gastritis.   DISCHARGE MEDICATIONS:  1. Protonix 40 mg p.o. q.d.  2. Lipitor 10 mg p.o. q.d.  3. Lisinopril 10 mg p.o. q.d.  4. Glipizide 5 mg p.o. q.d.  5. Vicodin 5/500 one to two tablets p.o. q.6h. p.r.n. pain.   DISPOSITION/FOLLOWUP:  1. The patient is to see Dr. Oretha [name] on 10/2 at 2:45 p.m. at [facility] outpatient clinic for followup. At that point, we will address how     she is going to keep getting her Protonix.  2. The use of Glipizide instead of Glucophage for the control of her     diabetes mellitus.  3. Beginning antidepressant medication.   CONSULTANTS:  Dr. Juanda Chance of [facility].   PROCEDURE:  1. Upper endoscopy 05/05/02 showed normal mucosa. No hiatal hernia or reflex     esophagitis. Normal gastric and duodenal outlet.  2. Gastric emptying study on 05/05/02.   HOSPITAL COURSE:  The patient is a 45 year old female who was recently  discharged from the hospital for a month history of nausea and vomiting. She  was sent home with Helidac for positive h. pylori and on Protonix; however,  she was unable to afford her medications and returned to the hospital on  Wednesday, 9/24, with nausea and vomiting and doubled over in pain. She was  readmitted to the hospital. She underwent a normal gastric emptying study.  She also underwent a negative  EGD as mentioned above. She was discharged  home in stable condition with Protonix samples.   Problem 1. Nausea and vomiting and abdominal pain. There was still no clear  etiology for this. Perhaps it is GERD. We are going to treat her with  Protonix 40 mg p.o. q.d. I will discuss with GI whether we will still treat  her positive h. pylori infection even though she has no observable ulcer  disease, in which case we will begin her on the Helidac when she follows up  in the clinic on Thursday. There is also a possibility that this is largely  psychogenic. We are going to discuss the benefits of medications on her next  clinic visit. There is also the possibility that this might be some more  isopteric etiology such as heavy metal intoxication. If her symptoms have  not been controlled with Protonix between now and the clinic visit, we will  continue her workup with a 24-hour urine screen looking for heavy metals. Of  note, her Glucophage was also discontinued because of the known emetic  affect of this medicine at times and because of its temporal relation.   Problem 2. Diabetes. She was started on Glipizide 5 mg p.o. q.d. Glucophage  has been discontinued. We will continue to manage her diabetes in the  outpatient setting.   Problem 3. The patient has anemia and iron labs consistent with anemia of  chronic disease. She has no observable GI source for her bleeding. This  might be related to her diabetes. We will continue to work this up as an  outpatient.   Problem 4. Hyperlipidemia. The patient is to continue on her Lipitor.   Problem 5. 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 We will continue lisinopril 10  mg p.o. q.d.   Problem 6. Chronic nephrolithiasis. The patient has been followed for her  nephrolithiasis for greater than 15 years now. It is unclear whether she has  had an actual workup of the stone composition. We will treat this in the  outpatient setting with the hope of  relieving her condition. This will  include a workup of PTH, hypercalciuria, normal calcaemia syndrome, and  other causes of nephrolithiasis.   Problem 7. Smoking. The patient has been encouraged to seize smoking as it  might aggravate her medication conditions, and we will continue to do so in  the outpatient setting.   DISCHARGE LABORATORY DATA:  Of note, discharge laboratories hemoglobin 10.6,  hematocrit 31.7, platelets 220, white blood cells 5.8. Sodium 139, potassium  3.5, chloride 106, bicarb 26, glucose 152, BUN 5, creatinine 0.7, calcium  8.4.   CONDITION ON DISCHARGE:  She was discharged home in stable condition and  will follow up with Dr. Viviann Spare in [facility] outpatient clinic 10/2.     Gina [name], M.D.                      Gina Coin, MD    BT/MEDQ  D:  05/06/2002  T:  05/09/2002  Job:  [id number]   cc:   Outpatient clinic   Hedwig Morton. Juanda Chance, M.D. [facility]

## 2010-12-26 NOTE — Op Note (Signed)
Gina [name], SWARTZENTRUBER                       ACCOUNT NO.:  [id number]   MEDICAL RECORD NO.:  [id number]                   PATIENT TYPE:  OBV   LOCATION:  0472                                 FACILITY:  [facility]   PHYSICIAN:  Skeet Simmer., M.D.         DATE OF BIRTH:  03-01-1966   DATE OF PROCEDURE:  03/10/2002  DATE OF DISCHARGE:  03/11/2002                                 OPERATIVE REPORT   PREOPERATIVE DIAGNOSIS:  Symptomatic gallstones.   POSTOPERATIVE DIAGNOSIS:  Symptomatic gallstones.   PROCEDURE:  Laparoscopic cholecystectomy.   SURGEON:  Zigmund Daniel, M.D.   ASSISTANT:  Currie Paris, M.D.   ANESTHESIA:  General.   DESCRIPTION OF PROCEDURE:  After the patient was monitored and anesthetized  and had routine preparation and draping of the abdomen, I liberally infused  a local anesthetic just below the umbilicus, made about a 3 cm transverse  incision, dissected down to the midline fascia, and opened it longitudinally  for about 1.5 cm.  I then bluntly entered the abdominal cavity with my  finger.  I swept it around to eliminate adhesions in the region.  After  placement of a 0 Vicryl pursestring suture and securing a Hasson cannula, I  inflated the abdomen with CO2 and put in the scope.  Visualization of the  gallbladder was somewhat difficult because of a large amount of fat in the  omentum and mesentery.  I therefore obtained an angled viewing scope, and  that produced good visualization of the gallbladder at the infundibulum  area. I then anesthetized a spot in the epigastrium for a 10 mm port and two  spots in the right midabdomen for 5 mm ports and placed them under direct  vision.  I grasped the fundus of the gallbladder and elevated it toward the  right shoulder and then pulled the infundibulum laterally.  With the patient  head-up, foot-down, and tilted to the left, I dissected the hepatoduodenal  ligament, identifying the cystic duct and the  cystic artery.  I clipped the  cystic duct with one clip.  Because it was quite large and I was not  absolutely sure of the anatomy, I made a small hole in it, put in a  cholangiogram catheter, and performed a fluoroscopic cholangiogram showing  normal anatomy and showed that the clip was on the cystic duct and with  common hepatic duct and common bile duct clearly separate.  I then clipped  the distal cystic duct with three clips after removing the cholangiogram  catheter, and I divided it.  I similarly clipped and divided the cystic  artery and another small arterial branch.  I dissected the gallbladder from  the liver utilizing the hook and spatula cautery devices and gained  hemostasis with the cautery.  I copiously irrigated the gallbladder fossa  and removed the irrigant, being sure I had cauterized all the vessels.  After detaching the gallbladder from the  liver, I removed it through the  umbilical incision and tied the pursestring suture, then again checked for  hemostasis and found it to be good.  I removed the lateral ports under  direct vision, then allowed the CO2 to escape and removed the epigastric  port.  I then closed all skin incisions with intracuticular 4-0 Vicryl and  Steri-Strips.  The patient was stable through the procedure.                                                Skeet Simmer., M.D.   Elvis Coil  D:  03/10/2002  T:  03/16/2002  Job:  [id number]

## 2010-12-26 NOTE — H&P (Signed)
NAME:  Gina [name], Gina [name]                        ACCOUNT NO.:  [id number]   MEDICAL RECORD NO.:  [id number]                   PATIENT TYPE:  INP   LOCATION:  0378                                 FACILITY:  [facility]   PHYSICIAN:  Maretta Bees. Vonita Moss, M.D.             DATE OF BIRTH:  1966/03/08   DATE OF ADMISSION:  07/17/2002  DATE OF DISCHARGE:                                HISTORY & PHYSICAL   HISTORY:  This 45 year old white female presented herself to the office  today with severe left flank pain associated with nausea and vomiting. She  required Marcaine, Demerol and Phenergan and still she did not have good  relief of pain. She was seen in the emergency room on July 15, 2002 at  [facility] and her CT scan showed no change in her two large left renal  calculi and no change in the dilated calix associated with one of them.  There was some bilateral renal scarring but no hydronephrosis and no  ureteral stones. No other abnormalities on the CT scan were noted. She had  no gross hematuria or fever.   She has a past history of bouts of flank pain due to stones and/or  pyelonephritis in the past. She is admitted now for further diagnostic and  therapeutic measures. A cath urinalysis will be obtained for culture and lab  work will be performed.   PAST MEDICAL HISTORY:  1. Diabetes.  2. Hypertension.  3. Hypercholesterolemia.   PAST SURGICAL HISTORY:  1. Right oophorectomy.  2. Two cesarean sections.  3. Cholecystectomy earlier this year.  4. Carpal tunnel in the right wrist.  5. Endoscopic stone removal over the last several years several times.   MEDICATIONS:  1. Glucophage 500 mg b.i.d.  2. Protonix 40 mg q.d.  3. Lipitor 10 mg q.d.  4. Lisinopril 10 mg q.d.   ALLERGIES:  PENICILLIN and DARVOCET. She gets hives from the penicillin and  GI symptoms from the Darvocet.   SOCIAL HISTORY:  She does not drink alcohol. She does smoke a 1/2 pack of  cigarettes a day.   FAMILY  HISTORY:  Noncontributory.   REVIEW OF SYMPTOMS:  Done on health history form.   PHYSICAL EXAMINATION:  VITAL SIGNS:  Reveal blood pressure 132/80, pulse 84,  temperature 97.2.  GENERAL:  She is a very obese, white female in acute distress but she is  alert and oriented.  SKIN:  Warm and dry.  NECK:  Supple.  CHEST:  Clear.  ABDOMEN:  Very obese but no significant guarding or tenderness.  EXTREMITIES:  No edema.   IMPRESSION:  1. Left flank pain of uncertain etiology and rule out pyelonephritis.  2. Left renal calculi.  3. Diabetes.  4. Hypertension.  5. Hypercholesterolemia.  6. Obesity.   PLAN:  Obtain urine for culture by cath specimen and start Levaquin  intravenously along with parenteral analgesics and plan for cystoscopy and  further workup during this hospitalization.                                               Maretta Bees. Vonita Moss, M.D.    LJP/MEDQ  D:  07/18/2002  T:  07/18/2002  Job:  [id number]

## 2010-12-26 NOTE — Discharge Summary (Signed)
NAME:  Gina [name], Gina [name]                        ACCOUNT NO.:  [id number]   MEDICAL RECORD NO.:  [id number]                   PATIENT TYPE:  INP   LOCATION:  0378                                 FACILITY:  [facility]   PHYSICIAN:  Maretta Bees. Vonita Moss, M.D.             DATE OF BIRTH:  [birth year]   DATE OF ADMISSION:  07/17/2002  DATE OF DISCHARGE:  07/20/2002                                 DISCHARGE SUMMARY   FINAL DIAGNOSES:  1. Left flank pain of uncertain etiology.  2. History of pyelonephritis.  3. Left renal calculi.  4. Diabetes.  5. Hypertension.  6. Hypercholesterolemia.  7. Obesity.   PROCEDURES:  Cystoscopy and bilateral retrograde pyelograms with  interpretation on July 19, 2002.   HISTORY OF PRESENT ILLNESS:  This 45 year old white female with a long  history of recurrent stone disease and UTIs was admitted with severe left  flank pain associated with nausea and vomiting.  She had been seen in the  emergency room on July 15, 2002, at [facility], and a CT scan showed no  change in her two large left renal calculi, and no change in the dilated  calix.  No other abnormalities were noted on the CT scan.  Because of her  severe pain and discomfort she was admitted to the hospital for further  treatment and evaluation.   She is a diabetic and has hypertension and hypercholesterolemia.   PHYSICAL EXAMINATION:  GENERAL:  Obese white female in acute distress.  Alert and oriented.  VITAL SIGNS:  Afebrile, normal vital signs.  ABDOMEN:  No abdominal tenderness.  The rest of the exam was noncontributory.   LABORATORY DATA:  A CBC was obtained which showed a white count of 9100 and  a hemoglobin of 11.7.  Her complete metabolic panel just showed a low  albumin borderline at 3.4, and serum creatinine was normal, as were the  other values.  A catheterized urinalysis was negative for blood, and she had  0-2 white cells per high-power field.  Unfortunately, a urine culture  ordered was not completed.  But, in view of her negative urine and afebrile  condition, it was felt that infection was not a high risk at this point.   HOSPITAL COURSE:  After admission she underwent therapy with IV fluids and  morphine PCA pump and Phenergan and IV Levaquin.  Her pain did decrease day  by day.  She remained afebrile.  No stone was ever passed.  Cystoscopy and  retrograde pyelograms were obtained on July 19, 2002, and no obstruction  was seen.   On the morning of July 20, 2002, she was having much-diminished pain,  feeling better, afebrile, and ready for discharge.   DISCHARGE MEDICATIONS:  She will go home on her usual medications of:  1. Glucophage 500 mg b.i.d.  2. Protonix 40 mg q.d.  3. Lipitor 10 mg q.d.  4. Lisinopril 10 mg q.d.  5. She also is going home on:     A. Septra DS b.i.d.     B. Tylox for pain.     C. Phenergan 25 tablets for nausea.   FOLLOW-UP:  She will return to the office in follow-up pending her clinical  course.   CONDITION ON DISCHARGE:  She was sent home in improved condition.   DIET:  She will resume a diabetic diet.                                               Maretta Bees. Vonita Moss, M.D.    LJP/MEDQ  D:  07/20/2002  T:  07/20/2002  Job:  [id number]

## 2010-12-26 NOTE — Consult Note (Signed)
NAME:  Gina [name], PANOZZO                        ACCOUNT NO.:  [id number]   MEDICAL RECORD NO.:  [id number]                   PATIENT TYPE:  INP   LOCATION:  5733                                 FACILITY:  [facility]   PHYSICIAN:  Sandria Bales. Ezzard Standing, M.D.               DATE OF BIRTH:  1966/07/02   DATE OF CONSULTATION:  04/26/2002  DATE OF DISCHARGE:                                   CONSULTATION   REASON FOR CONSULTATION:  Persistent nausea and vomiting status post  laparoscopic cholecystectomy.   HISTORY OF PRESENT ILLNESS:  This is a 45 year old, morbidly obese white  female who is a patient of medicine teaching service who was admitted today  for persistent nausea, vomiting, and abdominal pain.  She had a laparoscopic  cholecystectomy by Dr. Marcy Panning, the chart says 03/02/2002; the patient  says it was 03/10/2002.  She actually was having abdominal pain for a month or  two before the laparoscopic cholecystectomy, had nausea and vomiting since  before then also.  Her right upper quadrant pain is better, but the  remainder of her symptoms are pretty much unchanged. She apparently has  shown up in the emergency room at least seven or eight times in the  intervening six weeks since her surgery, but she has not seen Dr. Orson Slick  back in our office for followup care.   There is at least three CT scans in the computer.  The first was 03/15/2002;  the second was 03/27/2002, and the most recent is 04/25/2002, all of which  were basically negative for any acute surgical problem.   ALLERGIES:  She is allergic to PENICILLIN which leads to hives.  DARVOCET  makes her vomit.   REVIEW OF SYSTEMS:  1. PULMONARY: She smokes about one-half pack of cigarettes a day.  2. CARDIAC:  She is on Lipitor for hypercholesterolemia and Lisinopril for     blood pressure.  3. GASTROINTESTINAL:  She has had this persistent nausea, vomiting, and     abdominal pain has been fairly diffuse and nonspecific.  She has had  no     documented fever that I can find in the chart.  She thinks she has lost     about 10 or 15 pounds, though her weight is still at 278 pounds.  4. UROLOGIC:  The is some question of whether or not she has kidney stones,     but never documented by CT scan.  She has two living children and are actually trying to adopt a child right  now.  1. ENDOCRINE:  She again has an elevated cholesterol, last one documented     was 348.  She is diabetic.  She has know this for about three or four     months.  Hemoglobin A1C is 10.   PHYSICAL EXAMINATION:  VITAL SIGNS:  Temperature 99, pulse 80, blood  pressure 182/83.  GENERAL:  She  is a morbidly obese white female.  Again, her weight is 276.  Her height is 5 feet 4 inches which makes her BMI about 47.  LUNGS:  Clear to auscultation.  HEART:  Regular rate and rhythm without murmur or rub.  ABDOMEN:  Soft.  She has a little bit of localized tenderness or guarding.  All her wounds look good.  She is so big, I really cannot feel any abdominal  mass or lesion.  EXTREMITIES:  Good strength in all four extremities.  NEUROLOGIC:  Grossly intact.   LABORATORY DATA:  Labs that I have access to show a white blood count of  9000, hemoglobin 11.7, hematocrit 34.3.  Sodium was 137 two days ago,  potassium 3.7, chloride 104, CO2 26, glucose 152.  Liver functions are all  normal except that her albumin is mildly depressed at 3.3, and her lipase  was 16.  Urinalysis was unremarkable.   CT scans done multiple times have been negative.   IMPRESSION:  1. Persistent nausea and vomiting without clear etiology.  Current plan is     anti-nausea medicines and proton pump inhibitor.  Will consider GI     consult with upper endoscopy.  There certainly is no clear surgical cause     for this nausea and vomiting.  2. Poorly controlled diabetes with elevated hemoglobin A1C.  3. Hypertensive.  4. Hyperlipidemia.  5. Morbidly obese.                                                Sandria Bales. Ezzard Standing, M.D.    DHN/MEDQ  D:  04/26/2002  T:  04/26/2002  Job:  [id number]   cc:   Talmage Coin, M.D.  [address]  [place], [place] [address]
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
## 2010-12-26 NOTE — Discharge Summary (Signed)
Gina [name], Gina [name]              ACCOUNT NO.:  [id number]   MEDICAL RECORD NO.:  [id number]          PATIENT TYPE:  INP   LOCATION:  9309                          [facility]   PHYSICIAN:  Charles A. Clearance Coots, M.D.DATE OF BIRTH:  22-May-1966   DATE OF ADMISSION:  11/25/2004  DATE OF DISCHARGE:  11/30/2004                                 DISCHARGE SUMMARY   ADMISSION DIAGNOSIS:  Postoperative nausea and vomiting.   DISCHARGE DIAGNOSIS:  Postoperative nausea and vomiting.  Resolved after  supportive management.  Discharged home in good condition.   REASON FOR ADMISSION:  A 45 year old white female who presents with nausea  and vomiting, status post total abdominal hysterectomy on October 30, 2004.  The patient has a history of gastroesophageal reflux disease and irritable  bowel syndrome along with diabetes and hypertension.  She developed a  postoperative ileus after surgery which required further management.  She  was discharged home on November 20, 2004.  She was seen in the office on November 24, 2004, and appeared to be doing well.  On the day of admission, she noted  the onset of severe nausea and vomiting and abdominal pain.  The patient  states that she has had bowel gas and bowel movements at home.   PAST SURGICAL HISTORY:  Total abdominal hysterectomy, cesarean section x2,  kidney stones.   PAST MEDICAL HISTORY:  Diabetes, irritable bowel syndrome, gastroesophageal  reflux disease, hypertension.   MEDICATIONS:  1.  Insulin.  2.  Lipitor.  3.  Blood pressure medicine.  4.  Protonix.  5.  P.o. medication for diabetes.   ALLERGIES:  PENICILLIN causes hives and nausea and vomiting.  She has a side  effect of nausea and vomiting with Darvocet-N 100.   PHYSICAL EXAMINATION:  GENERAL:  Obese female in no acute distress.  VITAL SIGNS:  Temperature 99.3, pulse 90, respiratory rate 24, blood  pressure 179/70.  LUNGS:  Clear to auscultation bilaterally.  HEART:  Regular rate and  rhythm.  ABDOMEN:  Soft, nontender.  Incision healing well.  Positive bowel sounds.  PELVIC:  Omitted.   IMPRESSION:  Postoperative nausea and vomiting.   PLAN:  Admit for supportive management.   LABORATORY DATA:  Hemoglobin 12.6, hematocrit 38.3, white blood cell count  7000, platelets 365,000.  Potassium 3.3, glucose 124, sodium 139, BUN 5,  creatinine 0.8.   Flat plate and upright of the abdomen showed no evidence of obstruction.  There were some mildly dilated loops of small bowel right mid abdomen.   HOSPITAL COURSE:  The patient was admitted and started on supportive  management.  She was much improved by hospital day #3.  By hospital day #4,  she was started on regular diet and tolerated that quite well and was  therefore discharged home on hospital day #5 in good condition able to  tolerate diet without any nausea and vomiting.  She received GI consultation  while during this admission by Petra Kuba, M.D. and was cleared for  discharge from that standpoint and was given instructions for follow-up.   DISCHARGE MEDICATIONS:  Continue  medications taken at home.  1.  Protonix 40 mg p.o. daily.  2.  Reglan 10 mg p.o. three times a day.   DISCHARGE INSTRUCTIONS:  The patient was given instructions for follow-up  with Dr. Ewing Schlein and with Roseanna Rainbow, M.D.      CAH/MEDQ  D:  11/30/2004  T:  11/30/2004  Job:  [id number]   cc:   Petra Kuba, M.D.  [address], [address]  [place]  [place] [address]  Fax: [phone]

## 2010-12-26 NOTE — H&P (Signed)
NAME:  Gina [name], Gina [name]              ACCOUNT NO.:  [id number]   MEDICAL RECORD NO.:  [id number]          PATIENT TYPE:  INP   LOCATION:  NA                            [facility]   PHYSICIAN:  Roseanna Rainbow, M.D.DATE OF BIRTH:  23-Feb-1966   DATE OF ADMISSION:  DATE OF DISCHARGE:                                HISTORY & PHYSICAL   CHIEF COMPLAINT:  The patient is a 45 year old Caucasian female with a long  history of chronic pelvic pain syndrome who presents for a total abdominal  hysterectomy and possible left salpingo-oophorectomy.   HISTORY OF PRESENT ILLNESS:  The patient has a long history of bilateral dull and sharp pain across her  lower abdomen. She also has associated nausea and vomiting. Workup to date  has included multiple CTs of her abdomen and pelvis. She has also had  multiple pelvic ultrasounds. On pelvic ultrasound the uterus is essentially  normal. There are bilateral dilated tubular structures in the adnexa  consistent with bilateral hydrosalpinges. Her CTs of her abdomen have been  remarkable for nephrolithiasis for which she is followed by urology. She has  had episodes of abnormal uterine bleeding with endometrial biopsies  demonstrating benign proliferative endometrium. She has been treated in the  past medically for irritable bowel syndrome. She has also been treated for  interstitial cystitis. She has a history of ovarian cysts and she is status  post a right oophorectomy in 1997. She is also status post bilateral tubal  ligation in 1986 and two previous cesarean deliveries. She was also  evaluated at the [facility] chronic pelvic pain clinic. She has failed attempts at  medical management with Depo-Provera and a mini pill. Most recently attempts  were made to manage her pelvic pain with a Mirena IUD. She has also seen a  physical therapist.   PAST OBSTETRICAL AND GYNECOLOGICAL HISTORY:  Please see the above. She  denies any history of sexually-transmitted  diseases.   PAST MEDICAL HISTORY:  Please see the above. Also remarkable for diabetes  mellitus, hypertension, hypercholesterolemia, gastroesophageal reflux  disease.   PAST SURGICAL HISTORY:  Please see the above. She is also status post  cholecystectomy.   ALLERGIES:  DARVOCET and PENICILLIN.   MEDICATIONS:  Include:  1.  Protonix 40 mg one p.o. daily.  2.  Albuterol MDI two puffs q.i.d. p.r.n.  3.  Lipitor 40 mg one p.o. daily.  4.  Lisinopril 20 mg one p.o. daily.  5.  Amaryl 4 mg one p.o. daily.  6.  Lantus insulin 30 units q.h.s.  7.  Vicodin.  8.  Amitriptyline.  9.  Phenergan.   FAMILY HISTORY:  Ovarian cancer, hypertension, diabetes mellitus.   SOCIAL HISTORY:  One-half pack per day tobacco use. She denies any alcohol  or drug use.   REVIEW OF SYSTEMS:  GU:  Please see history of present illness. GI:  Please  see history of present illness.   PHYSICAL EXAMINATION:  VITAL SIGNS:  Temperature 97.9, pulse 96, blood  pressure 162/79, weight 288 pounds, height 5 feet 4 inches.  GENERAL:  Obese Caucasian female in  no apparent distress.  LUNGS:  Clear to auscultation bilaterally.  HEART:  Regular rate and rhythm.  ABDOMEN:  Soft, nontender, no organomegaly, obese.  PELVIC:  The external female genitalia are atrophic appearing. With speculum  exam the vagina is clean. The bimanual exam is limited by the patient's body  habitus. However, the uterus is anteverted in position. The adnexa are  nonpalpable, nontender bilaterally.   ASSESSMENT:  Chronic pelvic pain syndrome refractory to multiple attempts at  medical management and other interventions requiring low-potency narcotics  for pain control in a patient with multiple medical problems. The patient  has been evaluated preoperatively by her primary care physician, Dr. Fleet Contras. Please see his preoperative clearance note. The risks, benefits,  and alternative forms of management were reviewed with the patient  and  informed consent was obtained. She was counseled that her pain may persist  after the surgery if the female organs are not the source of her pain.   PLAN:  The planned procedure is a total abdominal hysterectomy, possible  left salpingo-oophorectomy.      LAJ/MEDQ  D:  10/28/2004  T:  10/28/2004  Job:  [id number]

## 2010-12-26 NOTE — Discharge Summary (Signed)
NAME:  Gina [name], Gina [name]                        ACCOUNT NO.:  [id number]   MEDICAL RECORD NO.:  [id number]                   PATIENT TYPE:  INP   LOCATION:  5735                                 FACILITY:  [facility]   PHYSICIAN:  Laurell Roof, M.D.                DATE OF BIRTH:  04/01/1966   DATE OF ADMISSION:  07/22/2002  DATE OF DISCHARGE:  07/29/2002                                 DISCHARGE SUMMARY   DIAGNOSES:  1. Abdominal pain secondary to adhesions.  2. Hypertension.  3. Diabetes.   DISCHARGE MEDICATIONS:  1. Phenergan 25 mg p.o. q.4h. p.r.n. nausea and vomiting.  2. Tramadol 50 mg p.o. every day p.r.n. abdominal pain.  3. Librax 10 mg p.o. every day.  4. Glucophage 500 mg p.o. b.i.d.  5. Lisinopril 10 mg p.o. every day.  6. Lipitor 10 mg p.o. q.h.s.  7. Protonix 40 mg p.o. every day.  8. Colace 100 mg p.o. every day.  9. Senokot 1-2 pills every day as needed for constipation.   FOLLOWUP APPOINTMENTS:  The patient has an appointment with Dr. Janee Morn to  be scheduled for a few weeks at [phone] or sooner if there is no  improvement in her abdominal pain and nausea.  At that time we need to check  a BMP.   BRIEF HISTORY AND PHYSICAL:  This is a female patient of Dr. Janee Morn, who  presented to the outpatient clinic with a history of three months of nausea,  vomiting, abdominal pain, which was intermittent with multiple admissions to  both [facility] and [facility].  The patient had been on the regimen of  Glucophage, Protonix, Lipitor and lisinopril and she again developed  abdominal pain, nausea and vomiting and went to [facility] on 07/18/02 where  she had a cystoscopy as mentioned and she had stable renal calculi.  A CT  scan of the abdomen was negative for any disease.  The patient was  discharged on the following day.  The patient mentioned that she continued  to have nausea and vomiting and she had emesis of bile.  However, she  mentioned she was able to tolerate  p.o.   LABORATORY DATA:  On admission the patient had the following labs.  UA was  negative.  She had an ESR of 81.  She had hemoglobin 13.0, white blood count  9.6, platelets 272.  LFTs were within normal limits.  Her sodium was 136,  potassium 3.5, iron 102, bicarbonate 25, BUN 5, creatinine 0.6 and her sugar  at that point, glucose 174.  The patient continued to complain.   CONSULTATIONS:  Dr. Ewing Schlein, gastroenterology.   ADMISSION STUDIES:  1. Barium enema which was negative.  2. She also had a small bowel follow through study which questioned     gastroparesis.  3. Patient had a 24 hour urine collection for heavy metals which was     negative.  She also had four _____ which was negative.   HOSPITAL COURSE BY PROBLEMS:  1. Abdominal pain, nausea and vomiting.  Although the patient continued to     complain of nausea and vomiting, she was able to tolerate p.o., regular     diet and the patient only had two witnessed episodes of emesis.  One of     which she just excreted bile.  Second one was liquid, clear secretions.     The abdominal pain was controlled with morphine injection.  The patient     continued to request Phenergan.  Finally, she was placed on a colamine     patch which mentioned controlled her vomiting.  However, not her nausea.     The patient in the past year had six negative CTs for this same     complaint.  A GI consultation was requested.  Dr. Ewing Schlein suggested that     the patients abdominal pain may be due to spasm secondary to adhesions,     since the patient has a history of multiple surgeries including     gallbladder removal, C-section and apparently, she had an appendectomy.     The patient was started with Librax to see if that would help with     resolution of her symptoms.  The patient did have an ESR elevated of 81,     on repeat, it came down to 40 which led [place] to believe that she may have     possible area of inflammatory bowel disease.  However, a  colonoscopy was     not to be done as an inpatient.  It is to be scheduled as an outpatient,     but a barium enema and a small bowel follow through were both negative.     Because this had been going on now for such a long period of time, heavy     metal was looked at as one possibility; however, the urine was negative.  The patient also has depression in knowing that, first for perforins which  were all within normal limits.  At this point, it was felt that the patient  had an irritable bowel syndrome.  However, she has been on antidepressants  in the past and she mentioned she achieved no resolution of her symptoms in  the past either.  The patient was started on Librax as mentioned previously  to see if it helps with her complaints.  1. Hypertension.  The patient's hypertension remained stable during this     hospitalization.  2. Diabetes.  The patient had diabetes, well-controlled with Glucophage and     diet only.   DISCHARGE CONDITION:  On the day of discharge, the patient had improved  subjectively as mentioned.  She was always able to tolerate p.o.  She was  consulted on her symptoms and she was recommended to follow up with her  primary care physician, Dr. Janee Morn.   On the day of discharge, her labs were:  Sodium 134, potassium 3.4, chloride  98, glucose 106, BUN less than 2 and creatinine 0.6.  ANA was negative.  P-  ANCA was negative.  Urine culture was positive for blood, however, remember  the patient had just undergone a cystoscopy on 12/9.  Laurell Roof, M.D.    LC/MEDQ  D:  08/06/2002  T:  08/06/2002  Job:  [id number]   cc:   Oretha Ellis, M.D.  [address] [place], [place] [address]  Fax: [phone]
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
## 2010-12-26 NOTE — Discharge Summary (Signed)
NAME:  Gina [name], Gina [name]                        ACCOUNT NO.:  [id number]   MEDICAL RECORD NO.:  [id number]                   PATIENT TYPE:  INP   LOCATION:  9303                                 FACILITY:  [facility]   PHYSICIAN:  Roseanna Rainbow, M.D.         DATE OF BIRTH:  [birth year]   DATE OF ADMISSION:  05/15/2003  DATE OF DISCHARGE:  05/17/2003                                 DISCHARGE SUMMARY   CHIEF COMPLAINT:  The patient is a 45 year old gravida 3, para 2,  complaining of abdominal pain and vomiting.   HISTORY OF PRESENT ILLNESS:  The patient reports cramping for 3-4 days with  vaginal spotting with vomiting, starting one day prior to presentation.  She  also complains of bilateral lower abdominal pain and back pain.   MEDICATIONS:  Glucophage, Lipitor, Protonix.   ALLERGIES:  1. PENICILLIN.  2. DARVOCET.   PAST OBSTETRICAL AND GYNECOLOGICAL HISTORY:  She is status post two cesarean  deliveries.   PAST MEDICAL HISTORY:  1. Irritable bowel syndrome.  2. Asthma.  3. Diabetes mellitus.  4. Hypercholesterolemia.  5. GERD.  6. Nephrolithiasis.   SURGERY:  1. Laparoscopic cholecystectomy.  2. See above.  3. Carpal tunnel.  4. Right oophorectomy.   SOCIAL HISTORY:  One half pack per day tobacco use.  Denies ethanol or  substance abuse.   PHYSICAL EXAMINATION:  VITAL SIGNS:  Temperature 98.2, pulse 102,  respiratory rate 20, blood pressure 174/89.  GENERAL APPEARANCE:  Obese white female.  ABDOMEN:  Obese, soft.  PELVIC:  Deferred.   OTHER WORKUP:  Ultrasound:  Tubular fluid filled structure on the left  consistent with a hydrosalpinx calculus in the lower pole of the left kidney  with mild dilatation of the renal pelvis.   CBG 230.   ASSESSMENT:  Abdominal pain, back pain with nausea and vomiting rule out  gastroparesis, gastritis.   PLAN:  Admission, bowel rest and antacids.   HOSPITAL COURSE:  The patient was admitted, given a parenteral antiemetic,  narcotic analgesics, Carafate, Reglan and Protonix.  Her CBG remained in  range.  Her diet was slowly advanced, and she was discharged to home on  hospital day #2 with marked improvement in her symptoms and tolerating a  regular diet.   DISCHARGE DIAGNOSIS:  Gastritis, possible gastroparesis.   CONDITION:  Stable.   DIET:  Regular.   ACTIVITY:  Ad lib.   MEDICATIONS:  Resume home medications.   DISPOSITION:  The patient was to follow up with Dr. Lina Sar and to keep  her previous appointment scheduled at the [facility].                                               Roseanna Rainbow, M.D.    Judee Clara  D:  06/20/2003  T:  06/20/2003  Job:  [id number]

## 2010-12-26 NOTE — Discharge Summary (Signed)
[name], [name]              ACCOUNT NO.:  [id number]   MEDICAL RECORD NO.:  [id number]          PATIENT TYPE:  INP   LOCATION:                                FACILITY:  [facility]   PHYSICIAN:  Roseanna Rainbow, M.D.DATE OF BIRTH:  May 28, 1966   DATE OF ADMISSION:  10/30/2004  DATE OF DISCHARGE:  11/03/2004                                 DISCHARGE SUMMARY   CHIEF COMPLAINT:  The patient is a 45 year old Caucasian female with a long  history of chronic pelvic pain syndrome who presents for a total abdominal  hysterectomy and possible left salpingo-oophorectomy.  Please see dictated  history and physical for further details.   HOSPITAL COURSE:  The patient was admitted and underwent a total abdominal  hysterectomy with bilateral salpingo-oophorectomy and lysis of adhesions.  Please see the dictated operative summary for further details.  Her  postoperative course was remarkable for a slow return of bowel function;  however, she was discharged to home on postoperative day #4, tolerating a  regular diet.  She was also followed by Dr. Concepcion Elk and she remained  euglycemic and was given Lasix.   DISCHARGE DIAGNOSES:  1.  Adenomyosis.  2.  Adhesions.   PROCEDURE:  Total abdominal hysterectomy, bilateral salpingo-oophorectomy,  and lysis of adhesions.   CONDITION ON DISCHARGE:  Stable.   DIET:  ADA diet, regular.   MEDICATIONS:  Resume home medications.  Percocet 1-2 tablets every six hours  as needed.   ACTIVITY:  No strenuous activity.  Pelvic rest.   DISPOSITION:  The patient was to followup in the office in one week.      LAJ/MEDQ  D:  01/01/2005  T:  01/01/2005  Job:  [id number]

## 2010-12-26 NOTE — Op Note (Signed)
   NAME:  Gina [name], Gina [name]                        ACCOUNT NO.:  [id number]   MEDICAL RECORD NO.:  [id number]                   PATIENT TYPE:  INP   LOCATION:  0378                                 FACILITY:  [facility]   PHYSICIAN:  Maretta Bees. Vonita Moss, M.D.             DATE OF BIRTH:  1966/02/04   DATE OF PROCEDURE:  07/19/2002  DATE OF DISCHARGE:                                 OPERATIVE REPORT   PREOPERATIVE DIAGNOSIS:  Bilateral flank pain, rule out obstruction with  stones.   POSTOPERATIVE DIAGNOSIS:  Bilateral flank pain, rule out obstruction with  stones.   PROCEDURE:  Cystoscopy and bilateral retrograde pyelograms with  interpretation.   SURGEON:  Maretta Bees. Vonita Moss, M.D.   ASSISTANT:  Melvyn Novas, M.D.   ANESTHESIA:  General.   INDICATIONS:  This 45 year old white female has had a past history of stones  and UTI's and was admitted because of severe left flank pain with an element  of right flank pain.  She has been afebrile.  Urine cultures pending.  She  is on antibiotics.  A CT scan last week and when she presented to the  emergency room showed no ureteral stones or hydronephrosis or ureteral  obstruction, but she did have a stable stone in the lower pole of the left  kidney.  There was unexplained pain.  She is brought to the OR today for  further evaluation, for cystoscopy, retrograde pyelograms, and possibly  ureteroscopy.   DESCRIPTION OF PROCEDURE:  The patient was brought to operating room and  placed in lithotomy position.  The external genitalia were prepped and  draped in the usual fashion.  She was cystoscoped, and the bladder was  unremarkable.  Bilateral retrograde pyelograms were obtained, and both  ureters were delicate and drained completely.  The right pyelocalyceal  system was very delicate and unobstructed-appearing.  The left pyelocalyceal  system was a little bit larger in size but had no evidence of acute  obstruction.  The infundibulum was narrow,  draining the lower pole where her  two stones are located, and I do not believe they will very likely move out  of the lower pole.  We saw no lesions or reason to perform ureteroscopy and  therefore, at this point, the bladder was emptied and the scope removed and  the patient sent to the recovery room in good condition.                                               Maretta Bees. Vonita Moss, M.D.    LJP/MEDQ  D:  07/19/2002  T:  07/19/2002  Job:  [id number]

## 2010-12-26 NOTE — Consult Note (Signed)
Gina [name], Gina [name] NO.:  [id number]   MEDICAL RECORD NO.:  [id number]          PATIENT TYPE:  INP   LOCATION:  9309                          FACILITY:  [facility]   PHYSICIAN:  Graylin Shiver, M.D.   DATE OF BIRTH:  09-Oct-1965   DATE OF CONSULTATION:  11/28/2004  DATE OF DISCHARGE:                                   CONSULTATION   REASON FOR CONSULTATION:  The patient is a 45 year old white female status  post total abdominal hysterectomy on October 30, 2004.  She was discharged  several days after the hysterectomy but was readmitted a few days later with  nausea and vomiting.  She was diagnosed with ileus.  The patient was treated  in the hospital for a couple of weeks and improved.  She went home and was  seen in follow-up in the office and apparently was doing well at that time;  however, she was readmitted on the day of admission with recurrence of  abdominal pains, which were generalized nausea and vomiting.  She denied  hematemesis.   White blood cell count was 7000 yesterday, H&H 12.6 and 38.3.  Electrolytes:  Sodium 139, potassium 3.2, chloride 95, CO2 29.  Abdominal x-ray on April 18  showed small amount of dilated bowel loops that were persistent; however, it  was improved from previous studies in which she was diagnosed with the  ileus.  CT of the abdomen on November 12, 2004, showed an ileus and a right  renal calculus.   PAST HISTORY:  1.  Diabetes mellitus, on insulin.  2.  Irritable bowel syndrome.  3.  Interstitial cystitis.  4.  Chronic pelvic pain.   PRIOR SURGERIES:  1.  Cholecystectomy.  2.  Total abdominal hysterectomy with lysis of adhesions.   ALLERGIES:  DARVOCET and PENICILLIN.   REVIEW OF SYSTEMS:  No chest pain, shortness of breath, cough or sputum  production.   PHYSICAL EXAMINATION:  VITAL SIGNS:  Stable.  GENERAL:  She does not appear in any acute distress.  She is nonicteric.  CARDIAC:  Regular rhythm, no murmurs.  CHEST:  Lungs  are clear.  ABDOMEN:  Bowel sounds are diminished.  It is soft.  It is not overly  distended.  There is mild discomfort to palpation diffusely but nothing  serious.   IMPRESSION:  1.  Ileus.  2.  Hypokalemia.  3.  Multiple medical problems as mentioned above.   PLAN:  I would recommend putting this patient on IV Reglan 10 mg q.6h. and  also supplementing her potassium.  It is possible that the ileus is being  aggravated by the hypokalemia.  Hopefully, this will improve with a  combination of IV Reglan and potassium supplementation.  I will repeat labs  tomorrow and also a follow-up abdominal x-ray.      SFG/MEDQ  D:  11/28/2004  T:  11/28/2004  Job:  [id number]   cc:   Bing Neighbors. Clearance Coots, M.D.   Petra Kuba, M.D.  [address]  [place]  [place] [address]  Fax: [phone]

## 2010-12-26 NOTE — Discharge Summary (Signed)
[name], [name]              ACCOUNT NO.:  [id number]   MEDICAL RECORD NO.:  [id number]          PATIENT TYPE:  INP   LOCATION:  1519                         [facility]AC[facility]   PHYSICIAN:  Hettie Holstein, D.O.    DATE OF BIRTH:  [birth year]   DATE OF ADMISSION:  10/11/2006  DATE OF DISCHARGE:  10/19/2006                               DISCHARGE SUMMARY   PRIMARY CARE PHYSICIAN:  Unassigned.   FINAL DIAGNOSIS:  Preseptal cellulitis.   ADDITIONAL DIAGNOSES:  1. History of Methicillin resistant Staphylococcus aureus soft tissue      infection in the past.  2. History of diabetes mellitus poorly controlled.  3. History of morbid obesity.  4. History of penicillin allergy.  5. Intertrigo.  6. Poorly controlled diabetes mellitus as evidence by hemoglobin A1c      of 8.9 on presentation.   MEDICATIONS ON DISCHARGE:  1. Doxycycline 100 mg p.o. twice daily.  She was dispensed #42.  2. Lantus 68 units subcu q.h.s.  3. Ciloxan ophthalmic ointment as directed by Dr. Kathyrn Sheriff.  4. Nystatin powder as directed.  5. Dilaudid 2 mg 1 to 2 every 4 hours as needed for pain dispensed      #30.  6. Lisinopril 40 mg daily.  7. Lipitor 80 mg daily.  8. Actos 15 mg daily.  9. Glimepiride 4 mg daily.   DISPOSITION:  [name] was felt to be stable and improved condition.  She was discharge for followup with Dr. Maris Berger.  She is to  follow directions and an appointment on Friday at 10:30 a.m. on  10/22/2006.  She was instructed to followup with her primary care  physician  as well.  She was ordered compresses to her affected eye by  Dr. Charlotte Sanes.   CONSULTATIONS THIS ADMISSION:  Infectious disease with Dr. Orvan Falconer and  ophthalmologist described above by Dr. Charlotte Sanes.   PROCEDURES:  Mrs. Dignan had undergone several bedside expression with  drainage by Dr. Charlotte Sanes.  She initially was placed on vancomycin during  her course as well as continuing her home medications.  She had received  Rocephin as well and she was evaluated by Dr. Charlotte Sanes.  Her wound was  cultured and she underwent blood cultures as well.  This wound however  did not reveal growth.  She was seen by Dr. Orvan Falconer of infectious  disease and she was ultimately transitioned to oral doxycycline which  she tolerated quite well.  She had noticed some right lower extremity  swelling and Doppler studies were performed and verbally reported as  negative from ultrasound lab.  She  had some erosions that appeared like impetigo occurring on her nasal  bridge as well as her thumb.  These appeared to be clearing however  towards the end of the hospitalization.  She was felt to be medically  stable for followup with her primary physician as described.      Hettie Holstein, D.O.  Electronically Signed     ESS/MEDQ  D:  11/18/2006  T:  11/18/2006  Job:  [id number]

## 2010-12-26 NOTE — H&P (Signed)
NAMEKAMEELA, LEIPOLD              ACCOUNT NO.:  [id number]   MEDICAL RECORD NO.:  [id number]          PATIENT TYPE:  INP   LOCATION:                               FACILITY:  [facility]   PHYSICIAN:  Fleet Contras, M.D.    DATE OF BIRTH:  [birth year]   DATE OF ADMISSION:  03/16/2005  DATE OF DISCHARGE:                                HISTORY & PHYSICAL   PRESENTING COMPLAINT:  Pain and swelling of the chin.   HISTORY OF PRESENTING COMPLAINT:  Ms. Minshall is a 45 year old Caucasian lady  with past medical history significant for type 2 diabetes mellitus.  She  presented to the office [facility] this morning with few days of  progressive swelling and pain in the chin.  She had attended the emergency  room three days ago and again last night for this problem.  This started off  like a pimple and progressively gotten bigger.  She had an incision and  drainage performed at the emergency room last night but she continued to  have pain and the swelling has not decreased in size.  She has had no fever  or chills.  Patient has a headache.  She has no nausea, vomiting.  On  evaluation in the office she was noted to being acute, painful distress.  The chin was swollen, erythematous, and very tender.  Due to the progressive  nature of her symptoms, I decided to admit her to the hospital for  intravenous antibiotics and analgesia.   PAST MEDICAL HISTORY:  1.  Type 2 diabetes mellitus.  2.  Hypertension.  3.  Dyslipidemia.  4.  Gastroesophageal reflux disease.  5.  History of endometriosis status post hysterectomy.  6.  History of chronic low back pain.   MEDICATIONS:  1.  Protonix 40 mg once a day.  2.  Albuterol MDI two puffs q.i.d.  3.  Lipitor 40 mg once a day.  4.  Lisinopril 20 mg once a day.  5.  Amaryl 4 mg once a day.  6.  Lantus insulin 30 units q.h.s.  7.  Amitriptyline 25 mg q.h.s.   ALLERGIES:  PENICILLIN gives her hives and DARVOCET causes nausea and  vomiting.   PAST  SURGICAL HISTORY:  She has had multiple cesarean sections, lithotripsy,  laparoscopic cholecystectomy, right-sided carpal tunnel release, and  recently total abdominal hysterectomy.   FAMILY HISTORY AND SOCIAL HISTORY:  She is married.  Lives with her husband.  Both of her parents are deceased.  She has a brother in good health.  Two  children.  One of them has hypertension and they both have asthma.  She  smokes about 10 cigarettes a day.  She denies any use of alcohol or NSAID  drugs.   REVIEW OF SYSTEMS:  Noncontributory.   PHYSICAL EXAMINATION:  GENERAL:  She is sitting on the examination table in  acute, painful distress.  She is alert and oriented x3.  She is morbidly  obese.  VITAL SIGNS:  Blood pressure 160/90, heart rate of 88, temperature 98.8,  respiratory rate 16.  HEENT:  She is  not icteric or cyanotic.  She is not pale.  The chin mainly  to the left side shows a large edematous, erythematous, and tender swelling.  There is an incision on the lower end with gauze applied and this is  draining bloody discharge.  The oral cavity looks essentially normal.  There  is no cervical lymphadenopathy.  CHEST:  Clear to auscultation.  HEART:  1 and 2 are heard with no murmur.  No S3 gallop.  ABDOMEN:  Obese, soft.  Laparotomy scar is healed.  EXTREMITIES:  No edema.  No calf tenderness.  CNS:  Alert and oriented x3 with no focal neurological deficits.   LABORATORY DATA:  CBG is 244 in the office.   ASSESSMENT:  Ms. Kepple is a 45 year old Caucasian lady with cellulitis  involving the chin which is not responding to oral antibiotics in the  outpatient.  She is therefore being hospitalized for intravenous antibiotics  and analgesia management.   ADMITTING DIAGNOSES:  1.  Cellulitis of the chin.  2.  Type 2 diabetes mellitus, uncontrolled.   PLAN OF CARE:  She will be admitted to a medical bed.  She will be placed on  a 2 g sodium, carbohydrate modified diet.  CBGs will be  checked q.a.c. and  h.s.  Further laboratory data will include CBC, CMET, and urinalysis.  She  will be started on intravenous Avelox 400 mg once a day, intravenous half  normal saline at 25 mL/hour, intravenous morphine 2-4 mg q.4h. p.r.n., p.o.  Percocet 5/325 one to two tablets q.6h. p.r.n., percutaneous Lovenox for DVT  prophylaxis.  Her home medication will be continued as above.  This plan of  care has been explained to her and all her questions have been answered.      Fleet Contras, M.D.  Electronically Signed     EA/MEDQ  D:  03/16/2005  T:  03/16/2005  Job:  [id number]

## 2010-12-26 NOTE — Discharge Summary (Signed)
   NAME:  JAYLAH, GOODLOW                        ACCOUNT NO.:  [id number]   MEDICAL RECORD NO.:  [id number]                   PATIENT TYPE:  INP   LOCATION:  5735                                 FACILITY:  [facility]   PHYSICIAN:  Madaline Guthrie, M.D.                 DATE OF BIRTH:  1966/05/28   DATE OF ADMISSION:  07/21/2002  DATE OF DISCHARGE:  07/29/2002                                 DISCHARGE SUMMARY   DISCHARGE DIAGNOSES:  1. Abdominal pain secondary to adhesions.  2. Diabetes.  3. Hypertension.   DISCHARGE MEDICATIONS:  1. Phenergan 1 p.o. every four hours as needed for pain.  2. Tramadol 50 mg p.o. q.d. for abdominal pain.  3. Librax 2 pills each morning.   Dictation ended at this point.     Laurell Roof, M.D.                      Madaline Guthrie, M.D.    LC/MEDQ  D:  08/06/2002  T:  08/06/2002  Job:  [id number]   cc:   Oretha Ellis, M.D.  [address] [place], [place] [address]  Fax: [phone]

## 2010-12-26 NOTE — Op Note (Signed)
NAMEDEEDRA, PRO              ACCOUNT NO.:  [id number]   MEDICAL RECORD NO.:  [id number]          PATIENT TYPE:  INP   LOCATION:  9313                          [facility]   PHYSICIAN:  Roseanna Rainbow, M.D.DATE OF BIRTH:  12/13/1965   DATE OF PROCEDURE:  10/30/2004  DATE OF DISCHARGE:  11/03/2004                                 OPERATIVE REPORT   ADDENDUM:   PREOPERATIVE DIAGNOSIS:  Morbid obesity with the BMI of greater than 50.      LAJ/MEDQ  D:  11/04/2004  T:  11/04/2004  Job:  [id number]

## 2010-12-26 NOTE — Op Note (Signed)
Gina [name], Gina [name]              ACCOUNT NO.:  [id number]   MEDICAL RECORD NO.:  [id number]          PATIENT TYPE:  INP   LOCATION:  9373                          [facility]ILITY[facility]   PHYSICIAN:  Roseanna Rainbow, M.D.DATE OF BIRTH:  07/06/1966   DATE OF PROCEDURE:  10/30/2004  DATE OF DISCHARGE:                                 OPERATIVE REPORT   PREOPERATIVE DIAGNOSIS:  Chronic pelvic pain syndrome, bilateral  hydrosalpinges.   POSTOPERATIVE DIAGNOSIS:  Chronic pelvic pain syndrome, bilateral  hydrosalpinges. Bowel and omental adhesions.   PROCEDURE:  Total abdominal hysterectomy and bilateral salpingectomies with  lysis of adhesions.   SURGEON:  Anne Fu, M.D.   ANESTHESIA:  General tracheal.   COMPLICATIONS:  None.   ESTIMATED BLOOD LOSS:  200 mL.   FLUIDS AND URINE OUTPUT:  As per anesthesiology.   OPERATIVE FINDINGS:  There were filmy adhesions involving the sigmoid colon  to the posterior uterine wall. There also adhesions involving the omentum to  the parietoperitoneum anteriorly. The uterus appeared normal. There were  some adhesions involving the anterior cul-de-sac. The right ovary was  surgically absent. There was a dilated portion of fallopian tube on the  left. There was also a dilated distal portion of the fallopian tube on the  right.  The left ovary appeared normal.   DESCRIPTION OF PROCEDURE:  The risks, benefits, indications and alternatives  of the procedure were reviewed with the patient and informed consent was  obtained. The patient was taken to the operating room with an IV running.  The patient was placed in the dorsal lithotomy position, given general  anesthesia and prepped and draped in usual sterile fashion. The midline  incision was then made with the scalpel and extended sharply to the rectus  fascia. The fascia was then incised along the length of the incision with  the scalpel. The muscles of the anterior abdominal wall  were separated in  the midline. The parietoperitoneum was tented up and entered sharply. This  incision was then extended superiorly and inferiorly. The pelvis was  examined with the findings noted above. The adhesions noted above were  divided. An attempt was made to place the O'Connor-O'Sullivan retractor into  the incision and the bowel was packed away. However, exposure was limited.  An attempt was made to setup the Bookwalter retractor;  however, these  attempts were aborted. Ultimately a Balfour retractor was placed into the  incision with an extender apparatus placed. Again the bowel was packed away  with moistened laparotomy sponges. Two long Kelly clamps were placed on the  cornu and used for retraction. The round ligaments on both sides were  divided with cautery. The anterior leaf of the broad ligaments were incised  along the bladder reflection to the midline from both sides. The bladder was  dissected off the lower uterine segment and cervix sharply. The distal  portion of the fallopian tube on the right was clamped and excised. Two free  ligatures of #0 Vicryl then placed for hemostasis. The mesosalpinx on the  left was clamped with Kelly clamps and excised. The  mesosalpinx was then  oversewn with a running suture of 2-0 Vicryl. The uteroovarian ligament was  then doubly clamped, transected and both a free ligature and a suture  ligature was placed using #0 Vicryl. Hemostasis was visualized. The uterine  arteries were skeletonized bilaterally, clamped with parametrial clamps,  transected and suture ligated with #0 Vicryl. Again hemostasis was assured.  The uterosacral ligaments were clamped on both sides, transected and suture  ligated in a similar fashion. The cervix and uterus were amputated with  scissors. The vaginal cuff angles were closed with figure-of-eight sutures  of #0 Vicryl. The remainder of the vaginal cuff was closed with a series of  interrupted #0Vicryl  figure-of-eight sutures. Hemostasis was assured. The  pelvis was then copiously irrigated with warm normal saline.  All laparotomy  sponges and instruments were removed from the abdomen. The fascia was closed  using a Smead-Jones technique using #1 PDS. The subcuticular layer was  reapproximated with #0 plain in a running fashion. The skin was closed with  staples. Sponge, lap, needle and instrument counts were correct x2. The  patient was taken to the PACU awake and in stable condition.      LAJ/MEDQ  D:  10/30/2004  T:  10/30/2004  Job:  [id number]

## 2010-12-26 NOTE — Discharge Summary (Signed)
Gina [name], GEISEL              ACCOUNT NO.:  [id number]   MEDICAL RECORD NO.:  [id number]          PATIENT TYPE:  INP   LOCATION:  5032                         FACILITY:  [facility]   PHYSICIAN:  Fleet Contras, M.D.    DATE OF BIRTH:  [birth year]   DATE OF ADMISSION:  03/16/2005  DATE OF DISCHARGE:  03/23/2005                                 DISCHARGE SUMMARY   PRESENTATION:  Please see admission H&P for full details of presentation.   Ms. Asman is a 45 year old Caucasian lady with a past medical history  significant for type 2 diabetes mellitus, hypertension, gastroesophageal  reflux disease.  She was admitted from the office with complaints of a few  days of progressive swelling and pain of the chin.  She had been seen in the  emergency room and treated with oral antibiotics, but the swelling continued  to progress and did get bigger, more painful.  She even had an incision and  drainage performed in the emergency room the night before presentation, but  she continued to have pain and swelling, which have not resolved.  She did  not have any fevers or chills.  She did have a headache but no nausea or  vomiting.  In the office, she was noted to be in acute painful distress.  Her chin was swollen, erythematous, and very tender.  She was therefore  admitted to the hospital for IV antibiotics and suspicion of possible MRSA  infection.   HOSPITAL COURSE:  The patient was placed in a medical bed.  She was started  on intravenous antibiotics of Avelox 400 mg once daily, as well as  intravenous fluids.  She did have an elevated glucose of 244 and was  therefore on sliding scale with NovoLog insulin.  Pain management was with  intravenous morphine 2-4 mg q.4 hours and Percocet 5/325 one to two tablets  q.6 hours.  Culture of the chin did grow MRSA.  Also a urine culture grew  Escherichia coli.  She was therefore started on intravenous vancomycin and  Avelox was discontinued.  A PICC line was  placed for home vancomycin  therapy.  The patient's symptoms improved.  CT scan of the chin was  performed.  This did not show any evidence of abscess collection.  Her  symptoms improved significantly and pain was better controlled.  Swelling  had subsided significantly.  On March 23, 2005, she was feeling much better  and was ready to go home.  Her vital signs were stable.   DISCHARGE DIAGNOSES:  1.  Methicillin-resistant Staphylococcus aureus cellulitis of the chin.  2.  Urinary tract infection due to Escherichia coli.  3.  Type 2 diabetes mellitus, poorly controlled.   CONDITION ON DISCHARGE:  Stable.   DISPOSITION:  Home.   DISCHARGE MEDICATIONS:  1.  Intravenous vancomycin 1.75 g q.12 hours for 14 days.  2.  Lipitor 40 mg once a day.  3.  Lisinopril 20 mg once a day.  4.  Amaryl 4 mg once a day.  5.  Amitriptyline 25 mg nightly.  6.  Lantus Insulin 50 units  nightly.  7.  Albuterol MDI two puffs q.6 hours.  8.  Percocet 5/325 one p.o. q.6 hours.  9.  Protonix 40 mg once a day.   FOLLOWUP:  With Fleet Contras, M.D. in 1 week.  She is to have vancomycin  level monitoring by the home health agency.      Fleet Contras, M.D.  Electronically Signed     EA/MEDQ  D:  06/11/2005  T:  06/11/2005  Job:  [id number]

## 2010-12-26 NOTE — Discharge Summary (Signed)
NAMEEDNA, REDE              ACCOUNT NO.:  [id number]   MEDICAL RECORD NO.:  [id number]          PATIENT TYPE:  INP   LOCATION:  9317                          [facility]   PHYSICIAN:  Roseanna Rainbow, M.D.DATE OF BIRTH:  [birth year]   DATE OF ADMISSION:  11/05/2004  DATE OF DISCHARGE:  11/20/2004                                 DISCHARGE SUMMARY   CHIEF COMPLAINT:  The patient is a 45 year old para 2 status post a total  abdominal hysterectomy on October 30, 2004 complaining of nausea and vomiting  and abdominal pain.   HISTORY OF PRESENT ILLNESS:  Please see the above. The patient over the past  12 hours had noted nausea and vomiting and abdominal pain.  Her last bowel  movement was 1 day prior to presentation and was normal.   MEDICATIONS:  Protonix, albuterol, Lipitor, lisinopril, Lantus, and  amitriptyline.   ALLERGIES:  PENICILLIN, DARVOCET.   PAST OBSTETRICAL HISTORY:  She is status post two cesarean deliveries.   PAST GYNECOLOGICAL HISTORY:  Remarkable for dysfunctional uterine bleeding,  chronic pelvic pain syndrome. She has had a bilateral tubal ligation and a  right oophorectomy.   PAST MEDICAL HISTORY:  Diabetes mellitus, hypertension,  hypercholesterolemia, GERD.   SURGERIES:  Please see the above.   SOCIAL HISTORY:  One-half pack per day tobacco use. She denies any ethanol  use.   PHYSICAL EXAMINATION:  VITAL SIGNS:  Temperature 98.3, pulse 96, respiratory  rate 22, blood pressure 158/81.  GENERAL APPEARANCE:  Uncomfortable.  HEENT:  Normocephalic, atraumatic.  ABDOMEN:  Obese. Tender at the incision site. Normal active bowel sounds in  all four quadrants. Incision clean, dry, and intact.  BACK:  No costovertebral angle tenderness.  EXTREMITIES:  No clubbing, cyanosis, or edema.   ASSESSMENT:  A 45 year old Caucasian female status post total abdominal  hysterectomy with abdominal pain, nausea, and vomiting.   PLAN:  Admission, flat plate and  upright abdominal x-ray, CBC and a complete  metabolic profile, IV hydration, glycemic control.   HOSPITAL COURSE:  The patient was admitted. The abdominal x-ray was  consistent with an ileus versus an early partial small-bowel obstruction.  She also complained of increased serous drainage from the incision. She  remained euglycemic. Her initial electrolytes were normal. Her diet was then  gradually advanced. On April 3, the staples were removed and there was a  superficial wound separation noted. The wound was packed with gauze. On  April 4, she reported increasing abdominal pain. A repeat abdominal x-ray  was without change. An NG tube was placed. The CT scan was consistent with  an ileus and there was no evidence of any abscess. The NG tube was then  clamped. She reported a bowel movement. She was started on TPN because of  her poor nutritional status. The NG tube was discontinued on April 10. Her  nutritional status was improving. Her diet was slowly advanced. She was  discharged to home on April 13 tolerating a regular diet.   DISCHARGE DIAGNOSES:  1. Postoperative ileus.  2. Superficial wound separation.     CONDITION:  Stable.   DIET:  ADA diet.   MEDICATIONS:  Resume home medications.   DISPOSITION:  An [facility] referral was made for b.i.d. dressing  changes. The patient was to follow up in the office in several days.   ACTIVITY:  No strenuous activity.      LAJ/MEDQ  D:  01/01/2005  T:  01/01/2005  Job:  [id number]

## 2010-12-26 NOTE — Discharge Summary (Signed)
NAME:  Gina [name], Gina [name]                        ACCOUNT NO.:  [id number]   MEDICAL RECORD NO.:  [id number]                   PATIENT TYPE:  INP   LOCATION:  1610                                 FACILITY:  [facility]   PHYSICIAN:  Fleet Contras, M.D.                 DATE OF BIRTH:  [birth year]   DATE OF ADMISSION:  06/05/2003  DATE OF DISCHARGE:  06/09/2003                                 DISCHARGE SUMMARY   PRESENTING COMPLAINTS:  Severe abdominal pain, nausea, vomiting of a four  week duration.  There was no hematemesis, melena, nocturia.   ADMITTING LABORATORY:  Evaluation was essentially within normal limits.   ADMITTING DIAGNOSIS:  Nonspecific abdominal pain.   HOSPITAL COURSE:  She was started on Protonix with ________ and antiemetics.  A GI consult was requested and patient was seen by Jonita Albee, M.D.  The  patient was thought to have possible esophagitis/gastritis.  A pelvic  ultrasound scan was performed.  This was negative for any acute lesions.  The patient was to be seen in the outpatient department of  Lina Sar,  M.D. [facility] for possible repeat endoscopy.  On June 09, 2003 patient had no  further nausea, vomiting, abdominal pain.  Vital signs were stable.  Abdomen  was soft, nontender.  She was therefore considered stable for discharge.   DISPOSITION:  Is going home.  Follow-up is to be with Fleet Contras, M.D.  one week as well as Lina Sar, M.D. [facility].   DISCHARGE MEDICATIONS:  1. Lisinopril 10 mg daily.  2. Protonix 20 mg b.i.d.  3. Reglan 5 mg t.i.d.  4. Percocet 5/325 q.6h.  5. Albuterol 5 q.h.s.  6. Glipizide 5 mg daily.  7. Fentanyl 25 q.6h. p.r.n.                                               Fleet Contras, M.D.    EA/MEDQ  D:  07/09/2003  T:  07/09/2003  Job:  [id number]

## 2010-12-26 NOTE — Consult Note (Signed)
NAME:  Gina [name], Gina [name]                        ACCOUNT NO.:  [id number]   MEDICAL RECORD NO.:  [id number]                   PATIENT TYPE:  INP   LOCATION:  5735                                 FACILITY:  [facility]   PHYSICIAN:  Petra Kuba, M.D.                 DATE OF BIRTH:  03/05/1966   DATE OF CONSULTATION:  07/24/2002  DATE OF DISCHARGE:                                   CONSULTATION   REASON FOR CONSULTATION:  The patient is seen at the request of Dr. Wyonia Hough  for persistent nausea, vomiting, and abdominal pain.  She had been evaluated  by Dr. Juanda Chance in the past, and supposedly had a non-diagnostic endoscopy.  I  do not have that report.  She has also been in the ER multiple times and  admitted multiple times with multiple CT scans, ultrasounds, even a brain  workup for central causes.  She did have a cholecystectomy in the fall by  Dr. Remus Loffler for gallstones.  Based on the report, I believe an intraoperative  cholangiogram was done which was negative.  However, there was no x-ray  report on the computer.  Supposedly, she has not had previous GI problems  since these episodes started this summer.  She denies any lower abdominal  complaints.  Cannot say what causes the pain, sometimes it was in the right  upper quadrant mid-epigastric, and it seemed to be lower now that she has  had her surgery.  Food sometimes makes it worse.  Moving her bowels might  sometimes help.  Currently, she is denying any urinary complaints, although  has been evaluated by Dr. Vonita Moss, has had kidney stones in the past.   PAST MEDICAL HISTORY:  1. Type 2 diabetes.  2. Hypertension.  3. Increased cholesterol.  4. Kidney stones.  5. Right ovary removed.  6. Cholecystectomy.   FAMILY HISTORY:  Negative for any GI problems.   SOCIAL HISTORY:  Smokes, but does not drink, and denies drug.  Minimizes  over-the-counter medication use.   REVIEW OF SYMPTOMS:  Pertinent for maybe a 15 pound weight loss since  this  summer.   PHYSICAL EXAMINATION:  GENERAL:  No acute distress, laying comfortably in  the bed.  Currently doing well.  VITAL SIGNS:  Stable, afebrile.  HEENT:  Sclerae not icteric.  ABDOMEN:  Soft, nontender, good bowel movement.  EXTREMITIES:  Good peripheral pulses, no pedal edema.   LABORATORY DATA:  Pertinent for a normal CBC on admission with normal lipase  and liver tests.  Albumin 3.3.  Multiple CT scans, ultrasounds, brain workup  have been negative.  Ferratin was okay with a slight decrease percent  saturation.  Sed rate is 71.   ASSESSMENT:  Abdominal pain, nausea and vomiting, questionable etiology, in  a patient status post cholecystectomy.   PLAN:  Would probably complete her GI workup with a small-bowel follow-  through to rule  out adhesions, and then a barium enema just to be complete  and make sure there is no colon pathology.  I do not think she has any  indication for a colonoscopy.  She might also need an MRCP just to rule out  residual common bile duct stones, although it looks like the IAC is negative  based on the operative note.  If the problem is coming from adhesions, might  want to try an anti-spasmodic, particularly if you felt it was due to  anxiety, might was to chose Librax.  Let me know if I can help, and  otherwise await on above mentioned workup.                                               Petra Kuba, M.D.    MEM/MEDQ  D:  07/24/2002  T:  07/25/2002  Job:  [id number]   cc:   Madaline Guthrie, M.D.  [address] [place]  [place] [address]  Fax: [phone]   Maretta Bees. Vonita Moss, M.D.  [address], [address]  [place]  [place] [address]  Fax: [phone]

## 2011-02-16 ENCOUNTER — Emergency Department (HOSPITAL_COMMUNITY)
Admission: EM | Admit: 2011-02-16 | Discharge: 2011-02-16 | Disposition: A | Discharge status: Home / Self Care | Payer: Medicare Other | Attending: Emergency Medicine | Admitting: Emergency Medicine

## 2011-02-16 ENCOUNTER — Emergency Department (HOSPITAL_COMMUNITY): Payer: Medicare Other

## 2011-02-16 DIAGNOSIS — K219 Gastro-esophageal reflux disease without esophagitis: Secondary | ICD-10-CM | POA: Insufficient documentation

## 2011-02-16 DIAGNOSIS — E119 Type 2 diabetes mellitus without complications: Secondary | ICD-10-CM | POA: Insufficient documentation

## 2011-02-16 DIAGNOSIS — N390 Urinary tract infection, site not specified: Secondary | ICD-10-CM | POA: Insufficient documentation

## 2011-02-16 DIAGNOSIS — H53149 Visual discomfort, unspecified: Secondary | ICD-10-CM | POA: Insufficient documentation

## 2011-02-16 DIAGNOSIS — R112 Nausea with vomiting, unspecified: Secondary | ICD-10-CM | POA: Insufficient documentation

## 2011-02-16 DIAGNOSIS — I10 Essential (primary) hypertension: Secondary | ICD-10-CM | POA: Insufficient documentation

## 2011-02-16 DIAGNOSIS — R51 Headache: Secondary | ICD-10-CM | POA: Insufficient documentation

## 2011-02-16 DIAGNOSIS — R109 Unspecified abdominal pain: Secondary | ICD-10-CM | POA: Insufficient documentation

## 2011-02-16 LAB — URINE MICROSCOPIC-ADD ON

## 2011-02-16 LAB — CBC
MCH: 25.4 pg — ABNORMAL LOW (ref 26.0–34.0)
MCHC: 31.3 g/dL (ref 30.0–36.0)
Platelets: 232 10*3/uL (ref 150–400)

## 2011-02-16 LAB — DIFFERENTIAL
Basophils Absolute: 0 10*3/uL (ref 0.0–0.1)
Basophils Relative: 0 % (ref 0–1)
Eosinophils Absolute: 1.7 10*3/uL — ABNORMAL HIGH (ref 0.0–0.7)
Monocytes Absolute: 0.3 10*3/uL (ref 0.1–1.0)
Monocytes Relative: 3 % (ref 3–12)
Neutrophils Relative %: 53 % (ref 43–77)

## 2011-02-16 LAB — COMPREHENSIVE METABOLIC PANEL
ALT: 16 U/L (ref 0–35)
AST: 20 U/L (ref 0–37)
Albumin: 3.1 g/dL — ABNORMAL LOW (ref 3.5–5.2)
Calcium: 9.6 mg/dL (ref 8.4–10.5)
GFR calc Af Amer: >60 mL/min (ref >60)
Sodium: 137 mEq/L (ref 135–145)
Total Protein: 6.5 g/dL (ref 6.0–8.3)

## 2011-02-16 LAB — URINALYSIS, ROUTINE W REFLEX MICROSCOPIC
Glucose, UA: NEGATIVE mg/dL
Hgb urine dipstick: NEGATIVE
Specific Gravity, Urine: 1.011 (ref 1.005–1.030)
Urobilinogen, UA: 0.2 mg/dL (ref 0.0–1.0)
pH: 5.5 (ref 5.0–8.0)

## 2011-04-30 LAB — BASIC METABOLIC PANEL
Calcium: 9.8
Creatinine, Ser: 0.67
GFR calc Af Amer: >60
GFR calc non Af Amer: >60
Sodium: 136

## 2011-04-30 LAB — URINALYSIS, ROUTINE W REFLEX MICROSCOPIC
Protein, ur: 100 — AB
Urobilinogen, UA: 0.2

## 2011-04-30 LAB — URINE MICROSCOPIC-ADD ON

## 2011-05-01 LAB — URINALYSIS, ROUTINE W REFLEX MICROSCOPIC
Bilirubin Urine: NEGATIVE
Ketones, ur: NEGATIVE
Specific Gravity, Urine: 1.026

## 2011-05-01 LAB — URINE CULTURE: Colony Count: 60000

## 2011-05-01 LAB — URINE MICROSCOPIC-ADD ON

## 2011-05-06 LAB — LIPASE, BLOOD: Lipase: 21

## 2011-05-06 LAB — URINE CULTURE
Colony Count: NO GROWTH
Culture: NO GROWTH
Special Requests: NEGATIVE

## 2011-05-06 LAB — URINALYSIS, ROUTINE W REFLEX MICROSCOPIC
Glucose, UA: NEGATIVE
Glucose, UA: NEGATIVE
Ketones, ur: NEGATIVE
Ketones, ur: NEGATIVE
Protein, ur: 100 — AB
Protein, ur: 100 — AB
Urobilinogen, UA: 0.2
pH: 6

## 2011-05-06 LAB — CBC
HCT: 31 — ABNORMAL LOW
Hemoglobin: 10.7 — ABNORMAL LOW
Hemoglobin: 11.7 — ABNORMAL LOW
MCHC: 34.6
Platelets: 214
RBC: 3.96
RDW: 14.9
WBC: 6.7
WBC: 6.9

## 2011-05-06 LAB — BASIC METABOLIC PANEL
Calcium: 9
GFR calc non Af Amer: >60
Glucose, Bld: 154 — ABNORMAL HIGH
Potassium: 4.2
Sodium: 136

## 2011-05-06 LAB — GC/CHLAMYDIA PROBE AMP, GENITAL: GC Probe Amp, Genital: NEGATIVE

## 2011-05-06 LAB — COMPREHENSIVE METABOLIC PANEL
ALT: 26
AST: 33
Alkaline Phosphatase: 78
CO2: 25
Calcium: 9.6
Chloride: 102
GFR calc non Af Amer: >60
Glucose, Bld: 184 — ABNORMAL HIGH
Potassium: 4.4
Sodium: 137
Total Bilirubin: 0.6

## 2011-05-06 LAB — URINE MICROSCOPIC-ADD ON

## 2011-05-06 LAB — DIFFERENTIAL
Basophils Absolute: 0
Basophils Relative: 0
Eosinophils Absolute: 0.3
Eosinophils Relative: 5
Neutrophils Relative %: 61

## 2011-05-06 LAB — WET PREP, GENITAL: WBC, Wet Prep HPF POC: NONE SEEN

## 2011-05-06 LAB — HEMOGLOBIN A1C
Hgb A1c MFr Bld: 8.1 — ABNORMAL HIGH
Mean Plasma Glucose: 211

## 2011-05-11 LAB — URINE MICROSCOPIC-ADD ON

## 2011-05-11 LAB — URINALYSIS, ROUTINE W REFLEX MICROSCOPIC
Glucose, UA: NEGATIVE
Leukocytes, UA: NEGATIVE
pH: 6

## 2011-05-11 LAB — DIFFERENTIAL
Basophils Relative: 1
Eosinophils Relative: 9 — ABNORMAL HIGH
Lymphocytes Relative: 27
Monocytes Absolute: 0.4
Monocytes Relative: 5
Neutrophils Relative %: 58

## 2011-05-11 LAB — CBC
Hemoglobin: 12.2
MCHC: 33.2
RBC: 4.17
WBC: 8.9

## 2011-05-11 LAB — POCT I-STAT, CHEM 8
BUN: 22
Potassium: 4.4
Sodium: 134 — ABNORMAL LOW
TCO2: 26

## 2011-05-15 LAB — POCT CARDIAC MARKERS
CKMB, poc: 7.5 ng/mL (ref 1.0–8.0)
Troponin i, poc: <0.05 ng/mL (ref 0.00–0.09)

## 2011-05-15 LAB — DIFFERENTIAL
Eosinophils Relative: 10 % — ABNORMAL HIGH (ref 0–5)
Lymphocytes Relative: 23 % (ref 12–46)
Lymphs Abs: 2.1 10*3/uL (ref 0.7–4.0)
Monocytes Absolute: 0.3 10*3/uL (ref 0.1–1.0)

## 2011-05-15 LAB — CBC
MCHC: 33.2 g/dL (ref 30.0–36.0)
MCV: 87.7 fL (ref 78.0–100.0)
Platelets: 230 10*3/uL (ref 150–400)

## 2011-05-15 LAB — COMPREHENSIVE METABOLIC PANEL
AST: 32 U/L (ref 0–37)
Albumin: 3.1 g/dL — ABNORMAL LOW (ref 3.5–5.2)
Calcium: 8.8 mg/dL (ref 8.4–10.5)
Creatinine, Ser: 0.8 mg/dL (ref 0.4–1.2)
GFR calc Af Amer: >60 mL/min (ref >60)
Total Protein: 6.1 g/dL (ref 6.0–8.3)

## 2011-05-15 LAB — GLUCOSE, CAPILLARY: Glucose-Capillary: 149 mg/dL — ABNORMAL HIGH (ref 70–99)

## 2011-05-15 LAB — URINALYSIS, ROUTINE W REFLEX MICROSCOPIC
Bilirubin Urine: NEGATIVE
Glucose, UA: NEGATIVE mg/dL
Nitrite: POSITIVE — AB
Specific Gravity, Urine: 1.013 (ref 1.005–1.030)
pH: 6 (ref 5.0–8.0)

## 2011-05-15 LAB — URINE MICROSCOPIC-ADD ON

## 2011-05-15 LAB — LIPASE, BLOOD: Lipase: 14 U/L (ref 11–59)

## 2011-05-15 LAB — TROPONIN I: Troponin I: 0.01 ng/mL (ref 0.00–0.06)

## 2011-05-20 LAB — URINE MICROSCOPIC-ADD ON

## 2011-05-20 LAB — COMPREHENSIVE METABOLIC PANEL WITH GFR
BUN: 12
CO2: 24
Calcium: 8.8
Chloride: 100
Creatinine, Ser: 0.61
GFR calc non Af Amer: >60
Glucose, Bld: 175 — ABNORMAL HIGH
Total Bilirubin: 0.8

## 2011-05-20 LAB — DIFFERENTIAL
Basophils Absolute: 0
Basophils Relative: 1
Eosinophils Absolute: 0.1
Eosinophils Relative: 2
Lymphocytes Relative: 26
Lymphs Abs: 1.9
Monocytes Absolute: 0.2
Monocytes Relative: 3
Neutro Abs: 4.9
Neutrophils Relative %: 68

## 2011-05-20 LAB — CBC
HCT: 35 — ABNORMAL LOW
Hemoglobin: 12.2
MCHC: 34.9
MCV: 85.5
Platelets: 227
RBC: 4.09
RDW: 14.9 — ABNORMAL HIGH
WBC: 7.1

## 2011-05-20 LAB — COMPREHENSIVE METABOLIC PANEL
ALT: 26
AST: 22
Albumin: 2.8 — ABNORMAL LOW
Alkaline Phosphatase: 71
GFR calc Af Amer: >60
Potassium: 3.9
Sodium: 134 — ABNORMAL LOW
Total Protein: 5.7 — ABNORMAL LOW

## 2011-05-20 LAB — URINALYSIS, ROUTINE W REFLEX MICROSCOPIC
Bilirubin Urine: NEGATIVE
Glucose, UA: NEGATIVE
Hgb urine dipstick: NEGATIVE
Ketones, ur: NEGATIVE
Leukocytes, UA: NEGATIVE
Nitrite: NEGATIVE
Protein, ur: 100 — AB
Specific Gravity, Urine: 1.023
Urobilinogen, UA: 1
pH: 6

## 2011-05-20 LAB — LIPASE, BLOOD: Lipase: 12

## 2012-01-13 ENCOUNTER — Emergency Department (HOSPITAL_COMMUNITY): Payer: Medicare Other

## 2012-01-13 ENCOUNTER — Emergency Department (HOSPITAL_COMMUNITY)
Admission: EM | Admit: 2012-01-13 | Discharge: 2012-01-14 | Disposition: A | Discharge status: Home / Self Care | Payer: Medicare Other | Attending: Emergency Medicine | Admitting: Emergency Medicine

## 2012-01-13 ENCOUNTER — Encounter (HOSPITAL_COMMUNITY): Payer: Self-pay

## 2012-01-13 DIAGNOSIS — N200 Calculus of kidney: Secondary | ICD-10-CM | POA: Insufficient documentation

## 2012-01-13 DIAGNOSIS — R509 Fever, unspecified: Secondary | ICD-10-CM | POA: Insufficient documentation

## 2012-01-13 DIAGNOSIS — R599 Enlarged lymph nodes, unspecified: Secondary | ICD-10-CM | POA: Insufficient documentation

## 2012-01-13 DIAGNOSIS — E119 Type 2 diabetes mellitus without complications: Secondary | ICD-10-CM | POA: Insufficient documentation

## 2012-01-13 DIAGNOSIS — Z90710 Acquired absence of both cervix and uterus: Secondary | ICD-10-CM | POA: Insufficient documentation

## 2012-01-13 DIAGNOSIS — L03311 Cellulitis of abdominal wall: Secondary | ICD-10-CM

## 2012-01-13 DIAGNOSIS — I10 Essential (primary) hypertension: Secondary | ICD-10-CM | POA: Insufficient documentation

## 2012-01-13 DIAGNOSIS — J020 Streptococcal pharyngitis: Secondary | ICD-10-CM | POA: Insufficient documentation

## 2012-01-13 DIAGNOSIS — L02219 Cutaneous abscess of trunk, unspecified: Secondary | ICD-10-CM | POA: Insufficient documentation

## 2012-01-13 DIAGNOSIS — Z9089 Acquired absence of other organs: Secondary | ICD-10-CM | POA: Insufficient documentation

## 2012-01-13 DIAGNOSIS — R1031 Right lower quadrant pain: Secondary | ICD-10-CM | POA: Insufficient documentation

## 2012-01-13 DIAGNOSIS — Z794 Long term (current) use of insulin: Secondary | ICD-10-CM | POA: Insufficient documentation

## 2012-01-13 DIAGNOSIS — J0300 Acute streptococcal tonsillitis, unspecified: Secondary | ICD-10-CM

## 2012-01-13 HISTORY — DX: Unspecified osteoarthritis, unspecified site: M19.90

## 2012-01-13 HISTORY — DX: Chronic sinusitis, unspecified: J32.9

## 2012-01-13 HISTORY — DX: Essential (primary) hypertension: I10

## 2012-01-13 HISTORY — DX: Unspecified asthma, uncomplicated: J45.909

## 2012-01-13 HISTORY — DX: Morbid (severe) obesity due to excess calories: E66.01

## 2012-01-13 HISTORY — DX: Irritable bowel syndrome, unspecified: K58.9

## 2012-01-13 HISTORY — DX: Hyperlipidemia, unspecified: E78.5

## 2012-01-13 HISTORY — DX: Fibromyalgia: M79.7

## 2012-01-13 HISTORY — DX: Other chronic pain: G89.29

## 2012-01-13 LAB — DIFFERENTIAL
Basophils Absolute: 0 10*3/uL (ref 0.0–0.1)
Eosinophils Absolute: 0.2 10*3/uL (ref 0.0–0.7)
Eosinophils Relative: 1 % (ref 0–5)
Lymphs Abs: 1.1 10*3/uL (ref 0.7–4.0)
Monocytes Absolute: 0.7 10*3/uL (ref 0.1–1.0)

## 2012-01-13 LAB — COMPREHENSIVE METABOLIC PANEL
ALT: 21 U/L (ref 0–35)
CO2: 26 mEq/L (ref 19–32)
Calcium: 9.2 mg/dL (ref 8.4–10.5)
Creatinine, Ser: 0.85 mg/dL (ref 0.50–1.10)
GFR calc Af Amer: >90 mL/min (ref >90)
GFR calc non Af Amer: 80 mL/min — ABNORMAL LOW (ref >90)
Glucose, Bld: 155 mg/dL — ABNORMAL HIGH (ref 70–99)
Sodium: 136 mEq/L (ref 135–145)
Total Protein: 6.6 g/dL (ref 6.0–8.3)

## 2012-01-13 LAB — URINALYSIS, ROUTINE W REFLEX MICROSCOPIC
Hgb urine dipstick: NEGATIVE
Nitrite: NEGATIVE
Protein, ur: NEGATIVE mg/dL
Specific Gravity, Urine: 1.013 (ref 1.005–1.030)
Urobilinogen, UA: 1 mg/dL (ref 0.0–1.0)

## 2012-01-13 LAB — URINE MICROSCOPIC-ADD ON

## 2012-01-13 LAB — CBC
HCT: 31.7 % — ABNORMAL LOW (ref 36.0–46.0)
MCH: 28.2 pg (ref 26.0–34.0)
MCV: 90.3 fL (ref 78.0–100.0)
Platelets: 196 10*3/uL (ref 150–400)
RDW: 14.6 % (ref 11.5–15.5)

## 2012-01-13 MED ORDER — MORPHINE SULFATE 4 MG/ML IJ SOLN
4.0000 mg | Freq: Once | INTRAMUSCULAR | Status: AC
  Administered 2012-01-13: 4 mg via INTRAVENOUS
  Filled 2012-01-13: qty 1

## 2012-01-13 MED ORDER — SODIUM CHLORIDE 0.9 % IV BOLUS (SEPSIS)
1000.0000 mL | Freq: Once | INTRAVENOUS | Status: AC
  Administered 2012-01-13: 1000 mL via INTRAVENOUS

## 2012-01-13 MED ORDER — IOHEXOL 300 MG/ML  SOLN
100.0000 mL | Freq: Once | INTRAMUSCULAR | Status: AC | PRN
  Administered 2012-01-13: 100 mL via INTRAVENOUS

## 2012-01-13 MED ORDER — OXYCODONE-ACETAMINOPHEN 5-325 MG PO TABS: 1.0000 | ORAL_TABLET | ORAL | Status: AC | PRN

## 2012-01-13 MED ORDER — HYDROMORPHONE HCL PF 1 MG/ML IJ SOLN
1.0000 mg | Freq: Once | INTRAMUSCULAR | Status: AC
  Administered 2012-01-13: 1 mg via INTRAVENOUS
  Filled 2012-01-13: qty 1

## 2012-01-13 MED ORDER — CLINDAMYCIN HCL 300 MG PO CAPS: 300.0000 mg | ORAL_CAPSULE | Freq: Four times a day (QID) | ORAL | Status: AC

## 2012-01-13 MED ORDER — VANCOMYCIN HCL IN DEXTROSE 1-5 GM/200ML-% IV SOLN
1000.0000 mg | Freq: Once | INTRAVENOUS | Status: AC
  Administered 2012-01-13: 1000 mg via INTRAVENOUS
  Filled 2012-01-13: qty 200

## 2012-01-13 MED ORDER — ONDANSETRON HCL 4 MG/2ML IJ SOLN
4.0000 mg | Freq: Once | INTRAMUSCULAR | Status: AC
Start: 2012-01-13 — End: 2012-01-13
  Administered 2012-01-13: 4 mg via INTRAVENOUS
  Filled 2012-01-13: qty 2

## 2012-01-13 NOTE — ED Provider Notes (Signed)
History     CSN: [id number]  Arrival date & time 01/13/12  1541   First MD Initiated Contact with Patient 01/13/12 1634      Chief Complaint  Patient presents with  . Abdominal Pain  . Emesis    (Consider location/radiation/quality/duration/timing/severity/associated sxs/prior treatment) HPI Pt reports 2 days of RLQ and periumbilical pain associated with nausea and vomiting. Decreased urination. No diarrhea. Pt has has multiple prev renal stones but states that this pain feels differently. Pt woke this morning with sore throat and fever and anterior cervical lymphadenopathy. Son with recent diagnosis of strep throat.  Past Medical History  Diagnosis Date  . Diabetes mellitus   . Kidney stone   . Hypertension   . Chronic pain   . Fibromyalgia   . Arthritis   . IBS (irritable bowel syndrome)   . Hyperlipidemia   . Obesities, morbid   . Sinusitis   . Asthma     Past Surgical History  Procedure Date  . Abdominal hysterectomy   . Cholecystectomy   . Lithotripsy   . Carpal tunnel release   . Cesarean section   . Cystectomy     Family History  Problem Relation Age of Onset  . Cancer Mother   . Heart failure Father   . Diabetes Father   . Hypertension Father     History  Substance Use Topics  . Smoking status: Former Games developer  . Smokeless tobacco: Never Used  . Alcohol Use: No    OB History    Grav Para Term Preterm Abortions TAB SAB Ect Mult Living                  Review of Systems  Constitutional: Positive for fever and chills.  HENT: Positive for sore throat. Negative for drooling, trouble swallowing and neck stiffness.   Respiratory: Negative for shortness of breath.   Cardiovascular: Negative for chest pain.  Gastrointestinal: Positive for nausea, vomiting and abdominal pain. Negative for diarrhea and constipation.  Genitourinary: Positive for difficulty urinating. Negative for dysuria, hematuria and flank pain.  Neurological: Negative for dizziness,  weakness, numbness and headaches.    Allergies  Darvocet and Penicillins  Home Medications   Current Outpatient Rx  Name Route Sig Dispense Refill  . BUTALBITAL-APAP-CAFFEINE 50-500-40 MG PO CAPS Oral Take 1 tablet by mouth 4 (four) times daily as needed. PAIN    . ALBUTEROL SULFATE HFA 108 (90 BASE) MCG/ACT IN AERS Inhalation Inhale 2 puffs into the lungs every 6 (six) hours as needed. SOB    . ALPRAZOLAM 1 MG PO TABS Oral Take 1 mg by mouth at bedtime as needed.    . ATORVASTATIN CALCIUM 20 MG PO TABS Oral Take 20 mg by mouth daily.    . FEBUXOSTAT 40 MG PO TABS Oral Take 80 mg by mouth daily.    Marland Kitchen HYDROCODONE-ACETAMINOPHEN 10-650 MG PO TABS Oral Take 1 tablet by mouth every 6 (six) hours as needed. Pain    . INSULIN REGULAR HUMAN 100 UNIT/ML IJ SOLN Subcutaneous Inject 200 Units into the skin 2 (two) times daily before a meal.    . LISDEXAMFETAMINE DIMESYLATE 20 MG PO CAPS Oral Take 20 mg by mouth every morning.    Marland Kitchen METFORMIN HCL 500 MG PO TABS Oral Take 500 mg by mouth 2 (two) times daily with a meal.    . OMEPRAZOLE 20 MG PO CPDR Oral Take 20 mg by mouth daily.    Marland Kitchen PREGABALIN 200 MG PO CAPS  Oral Take 200 mg by mouth 2 (two) times daily.      BP 151/90  Pulse 130  Temp(Src) 99.9 F (37.7 C) (Oral)  Resp 20  SpO2 94%  Physical Exam  Nursing note and vitals reviewed. Constitutional: She is oriented to person, place, and time. She appears well-developed and well-nourished. No distress.  HENT:  Head: Normocephalic and atraumatic.  Mouth/Throat: Oropharyngeal exudate (no masses or uvular deviation) present.  Eyes: EOM are normal. Pupils are equal, round, and reactive to light.  Neck: Normal range of motion. Neck supple.  Cardiovascular: Normal rate and regular rhythm.   Pulmonary/Chest: Effort normal and breath sounds normal. No respiratory distress. She has no wheezes. She has no rales.  Abdominal: Soft. Bowel sounds are normal. There is tenderness (TTP RLQ and near  umbilicus, No rebound or guarding). There is no rebound and no guarding.  Musculoskeletal: Normal range of motion. She exhibits no edema and no tenderness.  Lymphadenopathy:    She has cervical adenopathy.  Neurological: She is alert and oriented to person, place, and time.  Skin: Skin is warm and dry. No rash noted. No erythema.  Psychiatric: She has a normal mood and affect. Her behavior is normal.    ED Course  Procedures (including critical care time)  Labs Reviewed  CBC - Abnormal; Notable for the following:    WBC 11.9 (*)    RBC 3.51 (*)    Hemoglobin 9.9 (*)    HCT 31.7 (*)    All other components within normal limits  DIFFERENTIAL - Abnormal; Notable for the following:    Neutrophils Relative 83 (*)    Neutro Abs 9.9 (*)    Lymphocytes Relative 10 (*)    All other components within normal limits  COMPREHENSIVE METABOLIC PANEL - Abnormal; Notable for the following:    Glucose, Bld 155 (*)    Albumin 3.0 (*)    GFR calc non Af Amer 80 (*)    All other components within normal limits  URINALYSIS, ROUTINE W REFLEX MICROSCOPIC - Abnormal; Notable for the following:    Leukocytes, UA TRACE (*)    All other components within normal limits  RAPID STREP SCREEN - Abnormal; Notable for the following:    Streptococcus, Group A Screen (Direct) POSITIVE (*)    All other components within normal limits  URINE MICROSCOPIC-ADD ON - Abnormal; Notable for the following:    Squamous Epithelial / LPF FEW (*)    Bacteria, UA FEW (*)    All other components within normal limits  LIPASE, BLOOD   Ct Abdomen Pelvis W Contrast  01/13/2012  *RADIOLOGY REPORT*  Clinical Data: Right lower quadrant pain.  Fever.  CT ABDOMEN AND PELVIS WITH CONTRAST  Technique:  Multidetector CT imaging of the abdomen and pelvis was performed following the standard protocol during bolus administration of intravenous contrast.  Contrast: OMNIPAQUE IOHEXOL 300 MG/ML  SOLN  Comparison: CT of abdomen and pelvis  11/20/2010.  Findings:  Lung Bases: There is a small hiatal hernia.  Abdomen/Pelvis:  The patient is status post cholecystectomy.  There is diffusely decreased attenuation throughout the hepatic parenchyma, suggestive of hepatic steatosis.  No definite focal cystic or solid hepatic lesions are identified.  The enhanced appearance of the pancreas, spleen, and bilateral adrenal glands is unremarkable.  There is some parenchymal thinning in the upper pole of the right kidney, likely represent post infectious scarring.  In addition, there are large nonobstructive calculi in the lower pole collecting system  of the left kidney, the largest of which measures 13 mm in diameter.  No definite calculi are noted along the course of either ureter or within the lumen of the urinary bladder, and there is no evidence of hydroureteronephrosis or perinephric stranding at this time to suggest urinary tract obstruction.  No ascites or pneumoperitoneum and no pathologic distension of bowel.  No definite pathologic lymphadenopathy identified within the abdomen or pelvis.  The appendix is normal.  The patient is status post total abdominal hysterectomy and bilateral salpingo- oophorectomy.  The patient has a large pannus and there is diffuse laxity of the inferior anterior abdominal wall musculature.  A small umbilical hernia is noted, containing only omental fat.  Musculoskeletal: There are no aggressive appearing lytic or blastic lesions noted in the visualized portions of the skeleton.  The patient has a very large pannus, and there is diffuse subcutaneous stranding throughout the inferior aspect of the subcutaneous tissues of the pannus, which could suggest cellulitis and/or edema.  IMPRESSION: 1.  No acute findings in the abdomen or pelvis to account for the patient's symptoms.  Specifically, the appendix is normal in appearance. 2.  However, however, there is stranding throughout the fat of the inferior anterior abdominal wall,  which could suggest edema and/or cellulitis.  Clinical correlation is recommended. 3.  Diffuse lower anterior abdominal wall laxity with a small umbilical hernia containing only omental fat. 4.  Status post cholecystectomy, total abdominal hysterectomy and bilateral salpingo-oophorectomy. 5.  Nonobstructive calculi in the lower pole collecting system of the left kidney, largest which measures 13 mm. 6.  Scarring in the upper pole of the right kidney, likely post infectious. 7.  Probable mild hepatic steatosis.  Original Report Authenticated By: Florencia Reasons, M.D.     1. Strep tonsillitis   2. Abdominal wall cellulitis       MDM   Pt well appearing. D/c home with clindamycin for Strep throat and questionable abdominal wall cellulitis. F/u with PMD and return for worsening of symptoms or any concerns       Loren Racer, MD 01/13/12 2315

## 2012-01-13 NOTE — ED Notes (Signed)
Patient reports that she has had sharp shooting pain from umbilical area to right side. Patient also c/o fever(104.0), N/v, sore throat, headache, and low back pain. Patient reports that her son was positive for strep throat 3 days ago.

## 2012-01-13 NOTE — Discharge Instructions (Signed)
Strep Throat Strep throat is an infection of the throat caused by a bacteria named Streptococcus pyogenes. Your caregiver may call the infection streptococcal "tonsillitis" or "pharyngitis" depending on whether there are signs of inflammation in the tonsils or back of the throat. Strep throat is most common in children from 86 to 48 years old during the cold months of the year, but it can occur in people of any age during any season. This infection is spread from person to person (contagious) through coughing, sneezing, or other close contact. SYMPTOMS   Fever or chills.   Painful, swollen, red tonsils or throat.   Pain or difficulty when swallowing.   White or yellow spots on the tonsils or throat.   Swollen, tender lymph nodes or "glands" of the neck or under the jaw.   Red rash all over the body (rare).  DIAGNOSIS  Many different infections can cause the same symptoms. A test must be done to confirm the diagnosis so the right treatment can be given. A "rapid strep test" can help your caregiver make the diagnosis in a few minutes. If this test is not available, a light swab of the infected area can be used for a throat culture test. If a throat culture test is done, results are usually available in a day or two. TREATMENT  Strep throat is treated with antibiotic medicine. HOME CARE INSTRUCTIONS   Gargle with 1 tsp of salt in 1 cup of warm water, 3 to 4 times per day or as needed for comfort.   Family members who also have a sore throat or fever should be tested for strep throat and treated with antibiotics if they have the strep infection.   Make sure everyone in your household washes their hands well.   Do not share food, drinking cups, or personal items that could cause the infection to spread to others.   You may need to eat a soft food diet until your sore throat gets better.   Drink enough water and fluids to keep your urine clear or pale yellow. This will help prevent  dehydration.   Get plenty of rest.   Stay home from school, daycare, or work until you have been on antibiotics for 24 hours.   Only take over-the-counter or prescription medicines for pain, discomfort, or fever as directed by your caregiver.   If antibiotics are prescribed, take them as directed. Finish them even if you start to feel better.  SEEK MEDICAL CARE IF:   The glands in your neck continue to enlarge.   You develop a rash, cough, or earache.   You cough up green, yellow-brown, or bloody sputum.   You have pain or discomfort not controlled by medicines.   Your problems seem to be getting worse rather than better.  SEEK IMMEDIATE MEDICAL CARE IF:   You develop any new symptoms such as vomiting, severe headache, stiff or painful neck, chest pain, shortness of breath, or trouble swallowing.   You develop severe throat pain, drooling, or changes in your voice.   You develop swelling of the neck, or the skin on the neck becomes red and tender.   You have a fever.   You develop signs of dehydration, such as fatigue, dry mouth, and decreased urination.   You become increasingly sleepy, or you cannot wake up completely.  Document Released: 07/24/2000 Document Revised: 07/16/2011 Document Reviewed: 09/25/2010 [facility] Patient Information 2012 [place], [place].  Cellulitis Cellulitis is an infection of the skin and the  tissue beneath it. The area is typically red and tender. It is caused by germs (bacteria) (usually staph or strep) that enter the body through cuts or sores. Cellulitis most commonly occurs in the arms or lower legs.  HOME CARE INSTRUCTIONS   If you are given a prescription for medications which kill germs (antibiotics), take as directed until finished.   If the infection is on the arm or leg, keep the limb elevated as able.   Use a warm cloth several times per day to relieve pain and encourage healing.   See your caregiver for recheck of the infected site  as directed if problems arise.   Only take over-the-counter or prescription medicines for pain, discomfort, or fever as directed by your caregiver.  SEEK MEDICAL CARE IF:   The area of redness (inflammation) is spreading, there are red streaks coming from the infected site, or if a part of the infection begins to turn dark in color.   The joint or bone underneath the infected skin becomes painful after the skin has healed.   The infection returns in the same or another area after it seems to have gone away.   A boil or bump swells up. This may be an abscess.   New, unexplained problems such as pain or fever develop.  SEEK IMMEDIATE MEDICAL CARE IF:   You have a fever.   You or your child feels drowsy or lethargic.   There is vomiting, diarrhea, or lasting discomfort or feeling ill (malaise) with muscle aches and pains.  MAKE SURE YOU:   Understand these instructions.   Will watch your condition.   Will get help right away if you are not doing well or get worse.  Document Released: 05/06/2005 Document Revised: 07/16/2011 Document Reviewed: 03/14/2008 [facility] Patient Information 2012 [place], [place].

## 2012-01-14 NOTE — ED Notes (Signed)
Patient discharge via ambulatory with a steady gait. Respirations equal and unlabored. Skin warm and dry. No acute distress noted. 

## 2012-07-22 ENCOUNTER — Other Ambulatory Visit (HOSPITAL_COMMUNITY): Payer: Self-pay | Admitting: Internal Medicine

## 2012-07-22 DIAGNOSIS — E119 Type 2 diabetes mellitus without complications: Secondary | ICD-10-CM

## 2012-07-22 DIAGNOSIS — Z01810 Encounter for preprocedural cardiovascular examination: Secondary | ICD-10-CM

## 2012-08-04 ENCOUNTER — Encounter (HOSPITAL_COMMUNITY): Payer: Medicare Other

## 2012-08-05 ENCOUNTER — Inpatient Hospital Stay (HOSPITAL_COMMUNITY): Admission: RE | Admit: 2012-08-05 | Payer: Medicare Other | Source: Ambulatory Visit

## 2012-08-24 ENCOUNTER — Ambulatory Visit (HOSPITAL_COMMUNITY)
Admission: RE | Admit: 2012-08-24 | Discharge: 2012-08-24 | Disposition: A | Discharge status: Home / Self Care | Payer: Medicare Other | Source: Ambulatory Visit | Attending: Internal Medicine | Admitting: Internal Medicine

## 2012-08-24 DIAGNOSIS — I10 Essential (primary) hypertension: Secondary | ICD-10-CM | POA: Insufficient documentation

## 2012-08-24 DIAGNOSIS — R0602 Shortness of breath: Secondary | ICD-10-CM | POA: Insufficient documentation

## 2012-08-24 DIAGNOSIS — R51 Headache: Secondary | ICD-10-CM | POA: Insufficient documentation

## 2012-08-24 DIAGNOSIS — R42 Dizziness and giddiness: Secondary | ICD-10-CM | POA: Insufficient documentation

## 2012-08-24 DIAGNOSIS — Z01810 Encounter for preprocedural cardiovascular examination: Secondary | ICD-10-CM

## 2012-08-24 DIAGNOSIS — M542 Cervicalgia: Secondary | ICD-10-CM | POA: Insufficient documentation

## 2012-08-24 DIAGNOSIS — Z01818 Encounter for other preprocedural examination: Secondary | ICD-10-CM | POA: Insufficient documentation

## 2012-08-24 DIAGNOSIS — E119 Type 2 diabetes mellitus without complications: Secondary | ICD-10-CM | POA: Insufficient documentation

## 2012-08-24 MED ORDER — REGADENOSON 0.4 MG/5ML IV SOLN
0.4000 mg | Freq: Once | INTRAVENOUS | Status: AC
  Administered 2012-08-24: 0.4 mg via INTRAVENOUS

## 2012-08-24 MED ORDER — TECHNETIUM TC 99M SESTAMIBI GENERIC - CARDIOLITE
32.0000 | Freq: Once | INTRAVENOUS | Status: AC | PRN
  Administered 2012-08-24: 32 via INTRAVENOUS

## 2012-08-24 MED ORDER — AMINOPHYLLINE 25 MG/ML IV SOLN
125.0000 mg | Freq: Once | INTRAVENOUS | Status: AC
  Administered 2012-08-24: 125 mg via INTRAVENOUS

## 2012-08-24 NOTE — Procedures (Addendum)
[facility] [address] [place] [address] [phone]  Cardiology Nuclear Med Study  Gina [name] is a 47 y.o. female     MRN : [id number]     DOB: 14-Apr-1966  Procedure Date: 08/24/2012  Nuclear Med Background Indication for Stress Test:  Surgical Clearance History:  np prior cardiac history Cardiac Risk Factors: Family History - CAD, History of Smoking, Hypertension, IDDM Type 2, Lipids and Obesity  Symptoms:  Dizziness and DOE   Nuclear Pre-Procedure Caffeine/Decaff Intake:  7:00pm NPO After: 5:00am   IV Site: R Antecubital  IV 0.9% NS with Angio Cath:  22g  Chest Size (in):  n/a IV Started by: Koren Shiver, CNMT  Height: 5\' 4"  (1.626 m)  Cup Size: C  BMI:  Body mass index is 55.44 kg/(m^2). Weight:  323 lb (146.512 kg)   Tech Comments:  n/a    Nuclear Med Study 1 or 2 day study: 2 day  Stress Test Type:  Lexiscan  Order Authorizing Provider:  Zoila Shutter, MD   Resting Radionuclide: Technetium 56m Sestamibi  Resting Radionuclide Dose: 31.6 mCi   Stress Radionuclide:  Technetium 4m Sestamibi  Stress Radionuclide Dose: 32.0 mCi           Stress Protocol Rest HR: 78 Stress HR: 87  Rest BP: 139/68 Stress BP: 148/68  Exercise Time (min): n/a METS: n/a   Predicted Max HR: 172 bpm % Max HR: 45.35 bpm Rate Pressure Product: 52841   Dose of Adenosine (mg):  n/a Dose of Lexiscan: 0.4 mg  Dose of Atropine (mg): n/a Dose of Dobutamine: n/a mcg/kg/min (at max HR)  Stress Test Technologist: Esperanza Sheets, CCT Nuclear Technologist: Koren Shiver, CNMT   Rest Procedure:  Myocardial perfusion imaging was performed at rest 45 minutes following the intravenous administration of Technetium 21m Sestamibi. Stress Procedure:  The patient received IV Lexiscan 0.4 mg over 15-seconds.  Technetium 61m Sestamibi injected at 30-seconds.  The patient experienced SOB, dizziness,h ead and neck pain; 75 mg of IV  Aminophylline was administered with resolutions of symptoms.   There were no significant changes with Lexiscan.  Quantitative spect images were obtained after a 45 minute delay.  Transient Ischemic Dilatation (Normal <1.22):  0.88 Lung/Heart Ratio (Normal <0.45):  0.24 QGS EDV:  136 ml QGS ESV:  67 ml LV Ejection Fraction: 51%     Rest ECG: NSR, minor IVCD  Stress ECG: No significant change from baseline ECG  QPS Raw Data Images:  Normal; no motion artifact; normal heart/lung ratio. Stress Images:  Normal homogeneous uptake in all areas of the myocardium. Rest Images:  Normal homogeneous uptake in all areas of the myocardium. Subtraction (SDS):  No evidence of ischemia.  Impression Exercise Capacity:  Lexiscan with no exercise. BP Response:  Normal blood pressure response. Clinical Symptoms:  No significant symptoms noted. ECG Impression:  No significant ST segment change suggestive of ischemia. Comparison with Prior Nuclear Study: No previous nuclear study performed  Overall Impression:  Normal stress nuclear study.  LV Wall Motion:  NL LV Function; NL Wall Motion   Donie Moulton, MD  08/25/2012 1:35 PM

## 2012-08-25 ENCOUNTER — Ambulatory Visit (HOSPITAL_COMMUNITY)
Admission: RE | Admit: 2012-08-25 | Discharge: 2012-08-25 | Disposition: A | Discharge status: Home / Self Care | Payer: Medicare Other | Source: Ambulatory Visit | Attending: Internal Medicine | Admitting: Internal Medicine

## 2012-08-25 DIAGNOSIS — Z01810 Encounter for preprocedural cardiovascular examination: Secondary | ICD-10-CM

## 2012-08-25 DIAGNOSIS — E119 Type 2 diabetes mellitus without complications: Secondary | ICD-10-CM | POA: Insufficient documentation

## 2012-08-25 MED ORDER — TECHNETIUM TC 99M SESTAMIBI GENERIC - CARDIOLITE
31.6000 | Freq: Once | INTRAVENOUS | Status: AC | PRN
  Administered 2012-08-25: 32 via INTRAVENOUS

## 2012-12-12 ENCOUNTER — Encounter: Payer: Self-pay | Admitting: Internal Medicine

## 2014-04-11 ENCOUNTER — Telehealth: Payer: Self-pay | Admitting: Internal Medicine

## 2014-04-11 NOTE — Telephone Encounter (Signed)
C/D 04/11/14 for appt. 04/23/14

## 2014-04-11 NOTE — Telephone Encounter (Signed)
S/W PATIENT AND GAVE NP APPT FOR 09/14 @ 11 W/DR. MOHAMED.  [place]

## 2014-04-21 ENCOUNTER — Other Ambulatory Visit: Payer: Self-pay | Admitting: Internal Medicine

## 2014-04-21 DIAGNOSIS — D721 Eosinophilia: Principal | ICD-10-CM

## 2014-04-21 DIAGNOSIS — D72110 Idiopathic hypereosinophilic syndrome (ihes): Secondary | ICD-10-CM | POA: Insufficient documentation

## 2014-04-23 ENCOUNTER — Encounter (INDEPENDENT_AMBULATORY_CARE_PROVIDER_SITE_OTHER): Payer: Self-pay

## 2014-04-23 ENCOUNTER — Ambulatory Visit (HOSPITAL_BASED_OUTPATIENT_CLINIC_OR_DEPARTMENT_OTHER): Payer: Medicare Other | Admitting: Internal Medicine

## 2014-04-23 ENCOUNTER — Ambulatory Visit: Payer: Medicare Other

## 2014-04-23 ENCOUNTER — Encounter: Payer: Self-pay | Admitting: Internal Medicine

## 2014-04-23 ENCOUNTER — Other Ambulatory Visit (HOSPITAL_BASED_OUTPATIENT_CLINIC_OR_DEPARTMENT_OTHER): Payer: Medicare Other

## 2014-04-23 VITALS — BP 117/55 | HR 69 | Temp 98.0°F | Resp 20 | Ht 64.0 in | Wt 298.1 lb

## 2014-04-23 DIAGNOSIS — D721 Eosinophilia, unspecified: Secondary | ICD-10-CM

## 2014-04-23 DIAGNOSIS — D72110 Idiopathic hypereosinophilic syndrome (ihes): Secondary | ICD-10-CM

## 2014-04-23 HISTORY — DX: Eosinophilia, unspecified: D72.10

## 2014-04-23 LAB — CBC WITH DIFFERENTIAL/PLATELET
BASO%: 0.5 % (ref 0.0–2.0)
Basophils Absolute: 0 10*3/uL (ref 0.0–0.1)
EOS%: 7 % (ref 0.0–7.0)
Eosinophils Absolute: 0.5 10*3/uL (ref 0.0–0.5)
HEMATOCRIT: 38.8 % (ref 34.8–46.6)
HGB: 12.5 g/dL (ref 11.6–15.9)
LYMPH#: 2.2 10*3/uL (ref 0.9–3.3)
LYMPH%: 30.2 % (ref 14.0–49.7)
MCH: 29.3 pg (ref 25.1–34.0)
MCHC: 32.2 g/dL (ref 31.5–36.0)
MCV: 91.2 fL (ref 79.5–101.0)
MONO#: 0.3 10*3/uL (ref 0.1–0.9)
MONO%: 4.1 % (ref 0.0–14.0)
NEUT#: 4.3 10*3/uL (ref 1.5–6.5)
NEUT%: 58.2 % (ref 38.4–76.8)
Platelets: 205 10*3/uL (ref 145–400)
RBC: 4.26 10*6/uL (ref 3.70–5.45)
RDW: 16.5 % — ABNORMAL HIGH (ref 11.2–14.5)
WBC: 7.3 10*3/uL (ref 3.9–10.3)

## 2014-04-23 LAB — COMPREHENSIVE METABOLIC PANEL (CC13)
ALT: 21 U/L (ref 0–55)
AST: 20 U/L (ref 5–34)
Albumin: 3.5 g/dL (ref 3.5–5.0)
Alkaline Phosphatase: 89 U/L (ref 40–150)
Anion Gap: 9 mEq/L (ref 3–11)
BILIRUBIN TOTAL: 0.29 mg/dL (ref 0.20–1.20)
BUN: 15.8 mg/dL (ref 7.0–26.0)
CHLORIDE: 106 meq/L (ref 98–109)
CO2: 26 mEq/L (ref 22–29)
CREATININE: 1 mg/dL (ref 0.6–1.1)
Calcium: 9.7 mg/dL (ref 8.4–10.4)
Glucose: 73 mg/dl (ref 70–140)
Potassium: 4.3 mEq/L (ref 3.5–5.1)
SODIUM: 141 meq/L (ref 136–145)
TOTAL PROTEIN: 7.1 g/dL (ref 6.4–8.3)

## 2014-04-23 LAB — LACTATE DEHYDROGENASE (CC13): LDH: 249 U/L — ABNORMAL HIGH (ref 125–245)

## 2014-04-23 NOTE — Progress Notes (Signed)
Checked in new pt with no financial concerns at this time.  Pt has 2 insurance so she shouldn't need any financial assistance but I gave her Raquel's card for any questions or concerns.

## 2014-04-23 NOTE — Progress Notes (Signed)
[place] [phone]) [phone]   [phone]  CONSULT NOTE  REFERRING PHYSICIAN: Dr. York Ram.  REASON FOR CONSULTATION:  48 years old white female with eosinophilia.  HPI ECE Gina [name] is a 48 y.o. female was past medical history significant for multiple medical problems including history of diabetes mellitus, chronic pain syndrome, moderate obesity, fibromyalgia, degenerative disc disease, history of kidney stone, irritable bowel syndrome as well as hypertension and migraine headache. The patient was seen recently by her primary care physician Dr. Jimmye Norman for evaluation of persistent fatigue and weakness for the last 6 months. She was also complaining of mild nausea and chills but no significant fever. She was recently treated for sinus infection which is very recurrent. The patient was also recently treated for iron deficiency anemia with over-the-counter iron tablets once daily. She is also vitamin B12 injection monthly. She received a course of antibiotics for urinary tract infection last month. During her evaluation and the patient had CBC performed on 02/22/2014 and showed normal white blood count was normal hemoglobin and hematocrit as well as platelets count but she had elevated absolute eosinophil count of 1200. She was referred to me today for evaluation of this condition. Similar finding of elevated eosinophil count in the past but this was not done on followup bloodwork. The patient denied having any recent travel history outside the country. She denied having any diarrhea or rash. She has allergies to cats and pollen in addition to some medications including penicillin, Darvocet and morphine. She denied having any significant weight loss or night sweats. She has no chest pain, shortness breath, cough or hemoptysis. Family history significant for a mother with history of liver cirrhosis secondary to alcohol as well as female genital. Her father died  secondary to pneumonia.  The patient is married and has 2 children. She is currently disabled and used to work as a Secretary/administrator. She has a history of smoking one pack per day for around 3 years and unfortunately she continues to smoke. No history of alcohol or drug abuse. HPI  Past Medical History  Diagnosis Date  . Diabetes mellitus   . Kidney stone   . Hypertension   . Chronic pain   . Fibromyalgia   . Arthritis   . IBS (irritable bowel syndrome)   . Hyperlipidemia   . Obesities, morbid   . Sinusitis   . Asthma     Past Surgical History  Procedure Laterality Date  . Abdominal hysterectomy    . Cholecystectomy    . Lithotripsy    . Carpal tunnel release    . Cesarean section    . Cystectomy      Family History  Problem Relation Age of Onset  . Cancer Mother   . Heart failure Father   . Diabetes Father   . Hypertension Father     Social History History  Substance Use Topics  . Smoking status: Former Research scientist (life sciences)  . Smokeless tobacco: Never Used  . Alcohol Use: No    Allergies  Allergen Reactions  . Darvocet [Propoxyphene N-Acetaminophen] Nausea And Vomiting  . Penicillins Hives    Current Outpatient Prescriptions  Medication Sig Dispense Refill  . ACETAMINOPHEN-CAFF-BUTALBITAL 50-500-40 MG CAPS Take 1 tablet by mouth 4 (four) times daily as needed. PAIN      . albuterol (PROVENTIL HFA;VENTOLIN HFA) 108 (90 BASE) MCG/ACT inhaler Inhale 2 puffs into the lungs every 6 (six) hours as needed. SOB      . allopurinol (  ZYLOPRIM) 300 MG tablet Take 300 mg by mouth daily.      Marland Kitchen ALPRAZolam (XANAX) 1 MG tablet Take 1 mg by mouth 3 (three) times daily as needed.       Marland Kitchen atorvastatin (LIPITOR) 20 MG tablet Take 20 mg by mouth daily.      . ferrous sulfate 325 (65 FE) MG tablet Take 325 mg by mouth daily with breakfast.      . HYDROcodone-acetaminophen (NORCO) 10-325 MG per tablet Take 1 tablet by mouth 2 (two) times daily as needed.      . insulin glargine (LANTUS) 100  UNIT/ML injection Inject 200 Units into the skin 2 (two) times daily.      . insulin regular (NOVOLIN R,HUMULIN R) 100 units/mL injection Inject 200 Units into the skin 2 (two) times daily before a meal.      . metFORMIN (GLUCOPHAGE) 500 MG tablet Take 500 mg by mouth 2 (two) times daily with a meal.      . omeprazole (PRILOSEC) 20 MG capsule Take 20 mg by mouth daily.      . OXYCONTIN 30 MG T12A Take 30 mg by mouth 2 (two) times daily.      . pregabalin (LYRICA) 200 MG capsule Take 200 mg by mouth 2 (two) times daily.      Marland Kitchen triamcinolone (NASACORT) 55 MCG/ACT AERO nasal inhaler Place 1 spray into the nose daily.        No current facility-administered medications for this visit.    Review of Systems  Constitutional: positive for fatigue Eyes: negative Ears, nose, mouth, throat, and face: negative Respiratory: negative Cardiovascular: negative Gastrointestinal: negative Genitourinary:negative Integument/breast: negative Hematologic/lymphatic: negative Musculoskeletal:positive for back pain Neurological: negative Behavioral/Psych: negative Endocrine: negative Allergic/Immunologic: negative  Physical Exam  XFG:HWEXH, healthy, no distress, well nourished and well developed SKIN: skin color, texture, turgor are normal, no rashes or significant lesions HEAD: Normocephalic, No masses, lesions, tenderness or abnormalities EYES: normal, PERRLA EARS: External ears normal, Canals clear OROPHARYNX:no exudate, no erythema and lips, buccal mucosa, and tongue normal  NECK: supple, no adenopathy, no JVD LYMPH:  no palpable lymphadenopathy, no hepatosplenomegaly BREAST:not examined LUNGS: clear to auscultation , and palpation HEART: regular rate & rhythm, no murmurs and no gallops ABDOMEN:abdomen soft, non-tender, obese, normal bowel sounds and no masses or organomegaly BACK: Back symmetric, no curvature., No CVA tenderness EXTREMITIES:no joint deformities, effusion, or inflammation, no  edema, no skin discoloration, no clubbing  NEURO: alert & oriented x 3 with fluent speech, no focal motor/sensory deficits  PERFORMANCE STATUS: ECOG 1  LABORATORY DATA: Lab Results  Component Value Date   WBC 7.3 04/23/2014   HGB 12.5 04/23/2014   HCT 38.8 04/23/2014   MCV 91.2 04/23/2014   PLT 205 04/23/2014      Chemistry      Component Value Date/Time   NA 141 04/23/2014 1104   NA 136 01/13/2012 1646   K 4.3 04/23/2014 1104   K 4.2 01/13/2012 1646   CL 100 01/13/2012 1646   CO2 26 04/23/2014 1104   CO2 26 01/13/2012 1646   BUN 15.8 04/23/2014 1104   BUN 10 01/13/2012 1646   CREATININE 1.0 04/23/2014 1104   CREATININE 0.85 01/13/2012 1646      Component Value Date/Time   CALCIUM 9.7 04/23/2014 1104   CALCIUM 9.2 01/13/2012 1646   ALKPHOS 89 04/23/2014 1104   ALKPHOS 93 01/13/2012 1646   AST 20 04/23/2014 1104   AST 20 01/13/2012 1646   ALT  21 04/23/2014 1104   ALT 21 01/13/2012 1646   BILITOT 0.29 04/23/2014 1104   BILITOT 0.5 01/13/2012 1646       RADIOGRAPHIC STUDIES: No results found.  ASSESSMENT: This is a very pleasant 49 years old white female with multiple medical condition presented for evaluation of eosinophilia that was seen recently by her primary care physician. This is most likely reactive in nature these are secondary to chronic sinus infection as well as allergy. It is completely resolved at this point. This is unlikely to be malignant in nature.   PLAN: I had a lengthy discussion with the patient and her husband today about her current condition. Repeat CBC today showed no evidence for eosinophilia. I did not see a need for further investigation of her condition. I recommended for her to continue on observation with routine followup visit with her primary care physician. I would be happy to see her in the future if she has any other concerning findings. The patient voices understanding of current disease status and treatment options and is in agreement with the current care  plan.  All questions were answered. The patient knows to call the clinic with any problems, questions or concerns. We can certainly see the patient much sooner if necessary.  Thank you so much for allowing me to participate in the care of Gina [name]. I will continue to follow up the patient with you and assist in her care.  I spent 35 minutes counseling the patient face to face. The total time spent in the appointment was 50 minutes.  Disclaimer: This note was dictated with voice recognition software. Similar sounding words can inadvertently be transcribed and may not be corrected upon review.   Iolanda Folson K. 04/23/2014, 11:55 AM

## 2014-08-15 ENCOUNTER — Encounter: Payer: Self-pay | Admitting: Internal Medicine

## 2015-03-06 ENCOUNTER — Encounter: Payer: Self-pay | Admitting: *Deleted

## 2015-04-08 ENCOUNTER — Encounter: Payer: Self-pay | Admitting: Internal Medicine

## 2016-06-10 DIAGNOSIS — J189 Pneumonia, unspecified organism: Secondary | ICD-10-CM

## 2016-06-10 HISTORY — DX: Pneumonia, unspecified organism: J18.9

## 2016-06-25 ENCOUNTER — Encounter (HOSPITAL_COMMUNITY): Payer: Self-pay | Admitting: Emergency Medicine

## 2016-06-25 ENCOUNTER — Emergency Department (HOSPITAL_COMMUNITY): Payer: Medicare Other

## 2016-06-25 ENCOUNTER — Inpatient Hospital Stay (HOSPITAL_COMMUNITY)
Admission: EM | Admit: 2016-06-25 | Discharge: 2016-06-29 | DRG: 196 | Disposition: A | Discharge status: Home / Self Care | Payer: Medicare Other | Attending: Internal Medicine | Admitting: Internal Medicine

## 2016-06-25 DIAGNOSIS — E118 Type 2 diabetes mellitus with unspecified complications: Secondary | ICD-10-CM | POA: Diagnosis not present

## 2016-06-25 DIAGNOSIS — G43909 Migraine, unspecified, not intractable, without status migrainosus: Secondary | ICD-10-CM | POA: Diagnosis present

## 2016-06-25 DIAGNOSIS — J189 Pneumonia, unspecified organism: Secondary | ICD-10-CM | POA: Diagnosis present

## 2016-06-25 DIAGNOSIS — Z833 Family history of diabetes mellitus: Secondary | ICD-10-CM

## 2016-06-25 DIAGNOSIS — E1140 Type 2 diabetes mellitus with diabetic neuropathy, unspecified: Secondary | ICD-10-CM

## 2016-06-25 DIAGNOSIS — R0602 Shortness of breath: Secondary | ICD-10-CM | POA: Diagnosis not present

## 2016-06-25 DIAGNOSIS — Z794 Long term (current) use of insulin: Secondary | ICD-10-CM

## 2016-06-25 DIAGNOSIS — J849 Interstitial pulmonary disease, unspecified: Secondary | ICD-10-CM | POA: Diagnosis not present

## 2016-06-25 DIAGNOSIS — Z87891 Personal history of nicotine dependence: Secondary | ICD-10-CM

## 2016-06-25 DIAGNOSIS — Z79899 Other long term (current) drug therapy: Secondary | ICD-10-CM

## 2016-06-25 DIAGNOSIS — E1165 Type 2 diabetes mellitus with hyperglycemia: Secondary | ICD-10-CM | POA: Diagnosis present

## 2016-06-25 DIAGNOSIS — J9601 Acute respiratory failure with hypoxia: Secondary | ICD-10-CM | POA: Diagnosis present

## 2016-06-25 DIAGNOSIS — Z23 Encounter for immunization: Secondary | ICD-10-CM

## 2016-06-25 DIAGNOSIS — Z79891 Long term (current) use of opiate analgesic: Secondary | ICD-10-CM

## 2016-06-25 DIAGNOSIS — J4521 Mild intermittent asthma with (acute) exacerbation: Secondary | ICD-10-CM

## 2016-06-25 DIAGNOSIS — I10 Essential (primary) hypertension: Secondary | ICD-10-CM | POA: Diagnosis present

## 2016-06-25 DIAGNOSIS — Z8249 Family history of ischemic heart disease and other diseases of the circulatory system: Secondary | ICD-10-CM

## 2016-06-25 DIAGNOSIS — E662 Morbid (severe) obesity with alveolar hypoventilation: Secondary | ICD-10-CM | POA: Diagnosis present

## 2016-06-25 DIAGNOSIS — Z87440 Personal history of urinary (tract) infections: Secondary | ICD-10-CM | POA: Diagnosis present

## 2016-06-25 DIAGNOSIS — N3000 Acute cystitis without hematuria: Secondary | ICD-10-CM

## 2016-06-25 DIAGNOSIS — M797 Fibromyalgia: Secondary | ICD-10-CM | POA: Diagnosis present

## 2016-06-25 DIAGNOSIS — R319 Hematuria, unspecified: Secondary | ICD-10-CM

## 2016-06-25 DIAGNOSIS — J45909 Unspecified asthma, uncomplicated: Secondary | ICD-10-CM | POA: Diagnosis present

## 2016-06-25 DIAGNOSIS — K589 Irritable bowel syndrome without diarrhea: Secondary | ICD-10-CM | POA: Diagnosis present

## 2016-06-25 DIAGNOSIS — B9620 Unspecified Escherichia coli [E. coli] as the cause of diseases classified elsewhere: Secondary | ICD-10-CM | POA: Diagnosis present

## 2016-06-25 DIAGNOSIS — J45901 Unspecified asthma with (acute) exacerbation: Secondary | ICD-10-CM | POA: Diagnosis present

## 2016-06-25 DIAGNOSIS — Z6841 Body Mass Index (BMI) 40.0 and over, adult: Secondary | ICD-10-CM

## 2016-06-25 DIAGNOSIS — N390 Urinary tract infection, site not specified: Secondary | ICD-10-CM | POA: Diagnosis present

## 2016-06-25 DIAGNOSIS — E785 Hyperlipidemia, unspecified: Secondary | ICD-10-CM | POA: Diagnosis present

## 2016-06-25 DIAGNOSIS — Z87442 Personal history of urinary calculi: Secondary | ICD-10-CM

## 2016-06-25 LAB — URINE MICROSCOPIC-ADD ON

## 2016-06-25 LAB — CBC WITH DIFFERENTIAL/PLATELET
BASOS PCT: 0 %
Basophils Absolute: 0 10*3/uL (ref 0.0–0.1)
EOS ABS: 0.2 10*3/uL (ref 0.0–0.7)
EOS PCT: 4 %
HCT: 39.8 % (ref 36.0–46.0)
HEMOGLOBIN: 12.6 g/dL (ref 12.0–15.0)
LYMPHS ABS: 1.6 10*3/uL (ref 0.7–4.0)
Lymphocytes Relative: 33 %
MCH: 28.8 pg (ref 26.0–34.0)
MCHC: 31.7 g/dL (ref 30.0–36.0)
MCV: 90.9 fL (ref 78.0–100.0)
MONO ABS: 0.3 10*3/uL (ref 0.1–1.0)
MONOS PCT: 5 %
Neutro Abs: 2.8 10*3/uL (ref 1.7–7.7)
Neutrophils Relative %: 58 %
PLATELETS: 177 10*3/uL (ref 150–400)
RBC: 4.38 MIL/uL (ref 3.87–5.11)
RDW: 15.8 % — AB (ref 11.5–15.5)
WBC: 4.8 10*3/uL (ref 4.0–10.5)

## 2016-06-25 LAB — MRSA PCR SCREENING: MRSA BY PCR: NEGATIVE

## 2016-06-25 LAB — URINALYSIS, ROUTINE W REFLEX MICROSCOPIC
BILIRUBIN URINE: NEGATIVE
Glucose, UA: NEGATIVE mg/dL
KETONES UR: NEGATIVE mg/dL
NITRITE: POSITIVE — AB
Protein, ur: 100 mg/dL — AB
SPECIFIC GRAVITY, URINE: 1.01 (ref 1.005–1.030)
pH: 6 (ref 5.0–8.0)

## 2016-06-25 LAB — BASIC METABOLIC PANEL
Anion gap: 9 (ref 5–15)
BUN: 15 mg/dL (ref 6–20)
CALCIUM: 9.5 mg/dL (ref 8.9–10.3)
CHLORIDE: 106 mmol/L (ref 101–111)
CO2: 28 mmol/L (ref 22–32)
CREATININE: 0.77 mg/dL (ref 0.44–1.00)
GFR calc Af Amer: >60 mL/min (ref >60)
GFR calc non Af Amer: >60 mL/min (ref >60)
Glucose, Bld: 69 mg/dL (ref 65–99)
Potassium: 4.1 mmol/L (ref 3.5–5.1)
Sodium: 143 mmol/L (ref 135–145)

## 2016-06-25 LAB — GLUCOSE, CAPILLARY
Glucose-Capillary: 173 mg/dL — ABNORMAL HIGH (ref 65–99)
Glucose-Capillary: 246 mg/dL — ABNORMAL HIGH (ref 65–99)

## 2016-06-25 MED ORDER — INSULIN ASPART 100 UNIT/ML ~~LOC~~ SOLN
0.0000 [IU] | Freq: Every day | SUBCUTANEOUS | Status: DC
  Administered 2016-06-25 – 2016-06-26 (×2): 2 [IU] via SUBCUTANEOUS
  Administered 2016-06-27: 4 [IU] via SUBCUTANEOUS

## 2016-06-25 MED ORDER — INFLUENZA VAC SPLIT QUAD 0.5 ML IM SUSY
0.5000 mL | PREFILLED_SYRINGE | INTRAMUSCULAR | Status: AC
  Administered 2016-06-29: 0.5 mL via INTRAMUSCULAR
  Filled 2016-06-25 (×2): qty 0.5

## 2016-06-25 MED ORDER — INSULIN GLARGINE 100 UNIT/ML ~~LOC~~ SOLN
20.0000 [IU] | Freq: Every day | SUBCUTANEOUS | Status: DC
  Administered 2016-06-25 – 2016-06-28 (×4): 20 [IU] via SUBCUTANEOUS
  Filled 2016-06-25 (×5): qty 0.2

## 2016-06-25 MED ORDER — METHYLPREDNISOLONE SODIUM SUCC 125 MG IJ SOLR
125.0000 mg | Freq: Once | INTRAMUSCULAR | Status: AC
  Administered 2016-06-25: 125 mg via INTRAVENOUS
  Filled 2016-06-25: qty 2

## 2016-06-25 MED ORDER — LISINOPRIL 20 MG PO TABS
20.0000 mg | ORAL_TABLET | Freq: Every day | ORAL | Status: DC
  Administered 2016-06-26 – 2016-06-29 (×4): 20 mg via ORAL
  Filled 2016-06-25 (×4): qty 1

## 2016-06-25 MED ORDER — DEXTROSE 5 % IV SOLN
500.0000 mg | INTRAVENOUS | Status: DC
  Administered 2016-06-26: 500 mg via INTRAVENOUS
  Filled 2016-06-25 (×2): qty 500

## 2016-06-25 MED ORDER — ALBUTEROL SULFATE (2.5 MG/3ML) 0.083% IN NEBU
2.5000 mg | INHALATION_SOLUTION | RESPIRATORY_TRACT | Status: DC | PRN
  Administered 2016-06-26: 2.5 mg via RESPIRATORY_TRACT
  Filled 2016-06-25: qty 3

## 2016-06-25 MED ORDER — LISINOPRIL-HYDROCHLOROTHIAZIDE 20-25 MG PO TABS: 1.0000 | ORAL_TABLET | Freq: Every day | ORAL | Status: DC

## 2016-06-25 MED ORDER — SODIUM CHLORIDE 0.9 % IV SOLN: 250.0000 mL | INTRAVENOUS | Status: DC | PRN

## 2016-06-25 MED ORDER — DEXTROSE 5 % IV SOLN
500.0000 mg | Freq: Once | INTRAVENOUS | Status: AC
  Administered 2016-06-25: 500 mg via INTRAVENOUS
  Filled 2016-06-25: qty 500

## 2016-06-25 MED ORDER — IPRATROPIUM-ALBUTEROL 0.5-2.5 (3) MG/3ML IN SOLN
3.0000 mL | Freq: Four times a day (QID) | RESPIRATORY_TRACT | Status: DC
  Administered 2016-06-25 – 2016-06-29 (×17): 3 mL via RESPIRATORY_TRACT
  Filled 2016-06-25 (×16): qty 3

## 2016-06-25 MED ORDER — INSULIN ASPART PROT & ASPART (70-30 MIX) 100 UNIT/ML ~~LOC~~ SUSP
30.0000 [IU] | Freq: Three times a day (TID) | SUBCUTANEOUS | Status: DC
  Administered 2016-06-25 – 2016-06-29 (×12): 30 [IU] via SUBCUTANEOUS
  Filled 2016-06-25: qty 10

## 2016-06-25 MED ORDER — ATORVASTATIN CALCIUM 10 MG PO TABS
20.0000 mg | ORAL_TABLET | Freq: Every day | ORAL | Status: DC
  Administered 2016-06-26 – 2016-06-29 (×4): 20 mg via ORAL
  Filled 2016-06-25 (×4): qty 2

## 2016-06-25 MED ORDER — FERROUS SULFATE 325 (65 FE) MG PO TABS
325.0000 mg | ORAL_TABLET | Freq: Every day | ORAL | Status: DC
  Administered 2016-06-26 – 2016-06-29 (×4): 325 mg via ORAL
  Filled 2016-06-25 (×4): qty 1

## 2016-06-25 MED ORDER — ALLOPURINOL 300 MG PO TABS
300.0000 mg | ORAL_TABLET | Freq: Every day | ORAL | Status: DC
  Administered 2016-06-25 – 2016-06-29 (×5): 300 mg via ORAL
  Filled 2016-06-25 (×5): qty 1

## 2016-06-25 MED ORDER — OXYCODONE-ACETAMINOPHEN 10-325 MG PO TABS: 1.0000 | ORAL_TABLET | Freq: Three times a day (TID) | ORAL | Status: DC | PRN

## 2016-06-25 MED ORDER — PANTOPRAZOLE SODIUM 40 MG PO TBEC
40.0000 mg | DELAYED_RELEASE_TABLET | Freq: Every day | ORAL | Status: DC
  Administered 2016-06-26 – 2016-06-29 (×4): 40 mg via ORAL
  Filled 2016-06-25 (×4): qty 1

## 2016-06-25 MED ORDER — INSULIN ASPART 100 UNIT/ML ~~LOC~~ SOLN
0.0000 [IU] | Freq: Three times a day (TID) | SUBCUTANEOUS | Status: DC
  Administered 2016-06-25: 3 [IU] via SUBCUTANEOUS
  Administered 2016-06-26: 8 [IU] via SUBCUTANEOUS
  Administered 2016-06-26: 5 [IU] via SUBCUTANEOUS

## 2016-06-25 MED ORDER — KETOROLAC TROMETHAMINE 30 MG/ML IJ SOLN
30.0000 mg | Freq: Once | INTRAMUSCULAR | Status: DC
  Filled 2016-06-25: qty 1

## 2016-06-25 MED ORDER — SODIUM CHLORIDE 0.9% FLUSH: 3.0000 mL | INTRAVENOUS | Status: DC | PRN

## 2016-06-25 MED ORDER — ADULT MULTIVITAMIN W/MINERALS CH
1.0000 | ORAL_TABLET | Freq: Every day | ORAL | Status: DC
  Administered 2016-06-26 – 2016-06-29 (×4): 1 via ORAL
  Filled 2016-06-25 (×4): qty 1

## 2016-06-25 MED ORDER — PNEUMOCOCCAL VAC POLYVALENT 25 MCG/0.5ML IJ INJ
0.5000 mL | INJECTION | INTRAMUSCULAR | Status: AC
  Administered 2016-06-29: 0.5 mL via INTRAMUSCULAR
  Filled 2016-06-25 (×2): qty 0.5

## 2016-06-25 MED ORDER — METFORMIN HCL 500 MG PO TABS
500.0000 mg | ORAL_TABLET | Freq: Two times a day (BID) | ORAL | Status: DC
  Administered 2016-06-25 – 2016-06-26 (×2): 500 mg via ORAL
  Filled 2016-06-25 (×2): qty 1

## 2016-06-25 MED ORDER — ALBUTEROL (5 MG/ML) CONTINUOUS INHALATION SOLN
10.0000 mg/h | INHALATION_SOLUTION | Freq: Once | RESPIRATORY_TRACT | Status: AC
  Administered 2016-06-25: 10 mg/h via RESPIRATORY_TRACT
  Filled 2016-06-25: qty 20

## 2016-06-25 MED ORDER — OXYCODONE-ACETAMINOPHEN 5-325 MG PO TABS
1.0000 | ORAL_TABLET | Freq: Three times a day (TID) | ORAL | Status: DC | PRN
Start: 2016-06-25 — End: 2016-06-27
  Administered 2016-06-25 – 2016-06-27 (×5): 1 via ORAL
  Filled 2016-06-25 (×5): qty 1

## 2016-06-25 MED ORDER — SODIUM CHLORIDE 0.9% FLUSH
3.0000 mL | Freq: Two times a day (BID) | INTRAVENOUS | Status: DC
  Administered 2016-06-25 – 2016-06-28 (×7): 3 mL via INTRAVENOUS

## 2016-06-25 MED ORDER — DEXTROSE 5 % IV SOLN
1.0000 g | Freq: Once | INTRAVENOUS | Status: AC
  Administered 2016-06-25: 1 g via INTRAVENOUS
  Filled 2016-06-25: qty 10

## 2016-06-25 MED ORDER — DEXTROSE 5 % IV SOLN
1.0000 g | INTRAVENOUS | Status: DC
  Administered 2016-06-26 – 2016-06-29 (×4): 1 g via INTRAVENOUS
  Filled 2016-06-25 (×4): qty 10

## 2016-06-25 MED ORDER — OXYCODONE HCL 5 MG PO TABS
5.0000 mg | ORAL_TABLET | Freq: Three times a day (TID) | ORAL | Status: DC | PRN
  Administered 2016-06-25 – 2016-06-27 (×5): 5 mg via ORAL
  Filled 2016-06-25 (×5): qty 1

## 2016-06-25 MED ORDER — SODIUM CHLORIDE 0.9 % IV BOLUS (SEPSIS)
1000.0000 mL | Freq: Once | INTRAVENOUS | Status: AC
  Administered 2016-06-25: 1000 mL via INTRAVENOUS

## 2016-06-25 MED ORDER — FLUTICASONE PROPIONATE 50 MCG/ACT NA SUSP
1.0000 | Freq: Every day | NASAL | Status: DC
  Administered 2016-06-26 – 2016-06-29 (×4): 1 via NASAL
  Filled 2016-06-25: qty 16

## 2016-06-25 MED ORDER — HYDROCHLOROTHIAZIDE 25 MG PO TABS
25.0000 mg | ORAL_TABLET | Freq: Every day | ORAL | Status: DC
  Administered 2016-06-26 – 2016-06-29 (×4): 25 mg via ORAL
  Filled 2016-06-25 (×4): qty 1

## 2016-06-25 MED ORDER — METHYLPREDNISOLONE SODIUM SUCC 125 MG IJ SOLR
60.0000 mg | Freq: Two times a day (BID) | INTRAMUSCULAR | Status: DC
  Administered 2016-06-25 – 2016-06-28 (×6): 60 mg via INTRAVENOUS
  Filled 2016-06-25 (×6): qty 2

## 2016-06-25 MED ORDER — PREGABALIN 75 MG PO CAPS
200.0000 mg | ORAL_CAPSULE | Freq: Two times a day (BID) | ORAL | Status: DC
  Administered 2016-06-25 – 2016-06-29 (×8): 200 mg via ORAL
  Filled 2016-06-25 (×8): qty 2

## 2016-06-25 MED ORDER — IBUPROFEN 200 MG PO TABS
600.0000 mg | ORAL_TABLET | Freq: Four times a day (QID) | ORAL | Status: DC | PRN
  Administered 2016-06-25 – 2016-06-29 (×4): 600 mg via ORAL
  Filled 2016-06-25 (×4): qty 3

## 2016-06-25 MED ORDER — ALPRAZOLAM 1 MG PO TABS: 1.0000 mg | ORAL_TABLET | Freq: Two times a day (BID) | ORAL | Status: DC | PRN

## 2016-06-25 MED ORDER — ONDANSETRON HCL 4 MG/2ML IJ SOLN
4.0000 mg | Freq: Four times a day (QID) | INTRAMUSCULAR | Status: DC | PRN
  Administered 2016-06-25 – 2016-06-29 (×7): 4 mg via INTRAVENOUS
  Filled 2016-06-25 (×7): qty 2

## 2016-06-25 MED ORDER — METOCLOPRAMIDE HCL 5 MG/ML IJ SOLN
10.0000 mg | Freq: Once | INTRAMUSCULAR | Status: AC
  Administered 2016-06-25: 10 mg via INTRAVENOUS
  Filled 2016-06-25: qty 2

## 2016-06-25 MED ORDER — ENOXAPARIN SODIUM 40 MG/0.4ML ~~LOC~~ SOLN
40.0000 mg | SUBCUTANEOUS | Status: DC
  Administered 2016-06-25 – 2016-06-28 (×4): 40 mg via SUBCUTANEOUS
  Filled 2016-06-25 (×4): qty 0.4

## 2016-06-25 MED ORDER — BUTALBITAL-APAP-CAFFEINE 50-325-40 MG PO TABS
1.0000 | ORAL_TABLET | Freq: Four times a day (QID) | ORAL | Status: DC | PRN
  Administered 2016-06-25 – 2016-06-29 (×8): 1 via ORAL
  Filled 2016-06-25 (×8): qty 1

## 2016-06-25 NOTE — Progress Notes (Signed)
Entered in d/c instructions Please use the resources provided to you in emergency room by case manager to assist you're your choice of doctor for follow up     As a Medicaid client you MUST contact DSS/SSI each time you change address, move to another [place] or another state to keep your address updated  [place] to Dr appts if you are have full Medicaid: [phone], [phone]   Next Steps: Schedule an appointment as soon as possible for a visit  Instructions: [place]: [id number] [place], [place] [address] [email or web address] Use this website to assist with finding a Medicaid doctor, understanding your coverage & to renew application

## 2016-06-25 NOTE — ED Triage Notes (Signed)
Per pt, states increased asthma for 3 or more days-headache, cough and wheezing-inhalers not working

## 2016-06-25 NOTE — H&P (Signed)
History and Physical:    RAYNA NICOLLS   [id number] DOB: 01-26-1966 DOA: 06/25/2016  Referring MD/provider: Ozella Almond Ward, PA-C PCP: Harvie Junior, MD   Patient coming from: Home  Chief Complaint: Wheezing and shortness of breath  History of Present Illness:   Gina [name] is an 50 y.o. female with a PMH of asthma, HTN, HLD, DM, IBS, fibromyalgia who presents To the ED complaining of a 2 day history of worsening shortness of breath associated with a mostly nonproductive cough, but did report occasional green-gray sputum and significant wheezing. She also has had migraine headaches and nausea with a few episodes of vomiting. She used her albuterol inhaler and took 3 nebulizer treatments at home with no significant relief. Although she had quit smoking, the patient does endorse that she is now smoking a half a pack of cigarettes daily. She also endorses a history of urinary tract infections and has had some pressure in the bladder area and a foul smell to her urine.  ED Course:  The patient had a chest x-ray which showed chronic bronchitic changes and increased interstitial markings concerning for acute bronchitis or early interstitial pneumonia. Urinalysis was positive for nitrites and moderate leukocytes. WBC was within normal limits. The patient was given a dose of Rocephin and azithromycin as well as 125 mg methylprednisolone and albuterol 10 mg without significant relief. She was noted to drop her oxygen saturations when off oxygen and therefore was referred for admission.  ROS:   Review of Systems  Constitutional: Positive for chills, fever and malaise/fatigue.  HENT: Positive for congestion and sore throat.   Eyes: Negative.   Respiratory: Positive for cough, sputum production, shortness of breath and wheezing.   Cardiovascular: Positive for chest pain and leg swelling.  Gastrointestinal: Positive for abdominal pain, diarrhea, nausea and vomiting. Negative  for blood in stool, heartburn and melena.  Genitourinary: Positive for dysuria.       Foul smelling urine  Musculoskeletal: Positive for back pain, joint pain, myalgias and neck pain.  Neurological: Positive for dizziness and headaches.  Endo/Heme/Allergies: Positive for environmental allergies.  Psychiatric/Behavioral: Negative for depression. The patient is not nervous/anxious.     All other systems were reviewed are are negative. Past Medical History:   Past Medical History:  Diagnosis Date  . Arthritis   . Asthma   . Chronic pain   . Diabetes mellitus   . Fibromyalgia   . Hyperlipidemia   . Hypertension   . IBS (irritable bowel syndrome)   . Kidney stone   . Obesities, morbid ([place])   . Sinusitis     Past Surgical History:   Past Surgical History:  Procedure Laterality Date  . ABDOMINAL HYSTERECTOMY    . CARPAL TUNNEL RELEASE    . CESAREAN SECTION    . CHOLECYSTECTOMY    . CYSTECTOMY    . LITHOTRIPSY      Social History:   Social History   Social History  . Marital status: Married    Spouse name: Jenny Reichmann  . Number of children: 3  . Years of education: 32   Occupational History  . Disabled    Social History Main Topics  . Smoking status: Current Every Day Smoker    Packs/day: 0.50    Types: Cigarettes  . Smokeless tobacco: Never Used  . Alcohol use No  . Drug use: No  . Sexual activity: Yes    Birth control/ protection: None   Other Topics Concern  .  Not on file   Social History Narrative   Smokes 1/2 ppd. Lives with husband and son.  Unemployed/disabled.  Independent of ADLs.    Allergies   Penicillins; Darvocet [propoxyphene n-acetaminophen]; and Morphine and related  Family history:   Family History  Problem Relation Age of Onset  . Cancer Mother   . Heart failure Father   . Diabetes Father   . Hypertension Father     Current Medications:   Prior to Admission medications   Medication Sig Start Date End Date Taking? Authorizing  Provider  albuterol (PROVENTIL HFA;VENTOLIN HFA) 108 (90 BASE) MCG/ACT inhaler Inhale 2 puffs into the lungs every 6 (six) hours as needed for wheezing or shortness of breath.    Yes Historical Provider, MD  albuterol (PROVENTIL) (2.5 MG/3ML) 0.083% nebulizer solution Take 2.5 mg by nebulization every 6 (six) hours as needed for wheezing or shortness of breath.   Yes Historical Provider, MD  allopurinol (ZYLOPRIM) 300 MG tablet Take 300 mg by mouth daily. 01/20/14  Yes Historical Provider, MD  ALPRAZolam Duanne Moron) 1 MG tablet Take 1 mg by mouth 2 (two) times daily as needed for anxiety.    Yes Historical Provider, MD  Aspirin-Salicylamide-Caffeine (BC HEADACHE POWDER PO) Take 1 packet by mouth every 4 (four) hours as needed (for pain).   Yes Historical Provider, MD  atorvastatin (LIPITOR) 20 MG tablet Take 20 mg by mouth daily.   Yes Historical Provider, MD  butalbital-acetaminophen-caffeine (FIORICET, ESGIC) 50-325-40 MG tablet Take 1 tablet by mouth daily as needed for headache. 05/22/16  Yes Historical Provider, MD  cyclobenzaprine (FLEXERIL) 10 MG tablet Take 10 mg by mouth 3 (three) times daily as needed for muscle spasms.   Yes Historical Provider, MD  ferrous sulfate 325 (65 FE) MG tablet Take 325 mg by mouth daily with breakfast.   Yes Historical Provider, MD  fluticasone (FLONASE) 50 MCG/ACT nasal spray Place 1 spray into both nostrils daily. 06/18/16  Yes Historical Provider, MD  ibuprofen (ADVIL,MOTRIN) 200 MG tablet Take 600 mg by mouth every 6 (six) hours as needed for fever, headache, mild pain, moderate pain or cramping.   Yes Historical Provider, MD  insulin glargine (LANTUS) 100 UNIT/ML injection Inject 20 Units into the skin at bedtime.    Yes Historical Provider, MD  lisinopril-hydrochlorothiazide (PRINZIDE,ZESTORETIC) 20-25 MG tablet Take 1 tablet by mouth daily. 05/18/16  Yes Historical Provider, MD  metFORMIN (GLUCOPHAGE) 500 MG tablet Take 500 mg by mouth 2 (two) times daily with a  meal.   Yes Historical Provider, MD  Multiple Vitamin (MULTIVITAMIN WITH MINERALS) TABS tablet Take 1 tablet by mouth daily.   Yes Historical Provider, MD  NOVOLOG MIX 70/30 (70-30) 100 UNIT/ML injection Inject 30 Units into the skin 3 (three) times daily with meals. 06/05/16  Yes Historical Provider, MD  omeprazole (PRILOSEC) 20 MG capsule Take 20 mg by mouth daily with breakfast.    Yes Historical Provider, MD  oxyCODONE-acetaminophen (PERCOCET) 10-325 MG tablet Take 1 tablet by mouth 3 (three) times daily. 05/02/16  Yes Historical Provider, MD  pregabalin (LYRICA) 200 MG capsule Take 200 mg by mouth 2 (two) times daily.   Yes Historical Provider, MD  promethazine (PHENERGAN) 25 MG tablet Take 1 tablet by mouth 2 (two) times daily as needed for nausea/vomiting. 05/22/16  Yes Historical Provider, MD    Physical Exam:   Vitals:   06/25/16 1004 06/25/16 1148 06/25/16 1353  BP: (!) 218/89 148/77 160/75  Pulse: 97 100 96  Resp: 22  19 18  Temp:   98.1 F (36.7 C)  TempSrc: Oral  Oral  SpO2: 93% 100% 97%     Physical Exam: Blood pressure 160/75, pulse 96, temperature 98.1 F (36.7 C), temperature source Oral, resp. rate 18, SpO2 97 %. Gen: Moderate respiratory distress. Head: Normocephalic, atraumatic. Eyes: Pupils equal, round and reactive to light. Extraocular movements intact.  Sclerae nonicteric. No lid lag. Mouth: Oropharynx reveals dry mucous membranes. Dentition is poor with missing teeth and dental caries. Neck: Supple, no thyromegaly, no lymphadenopathy, no jugular venous distention. Chest: Lungs are diminished with diffuse inspiratory and expiratory wheezing. Increased work of breathing noted. CV: Heart sounds are regular with an S1, S2. Mildly tachycardic. No murmurs, rubs, clicks, or gallops.  Abdomen: Soft, nontender, nondistended with normal active bowel sounds. No hepatosplenomegaly or palpable masses. Extremities: Extremities are without clubbing, 1 + edema, no cyanosis.  Pedal pulses 2+.  Skin: Warm and dry. No rashes, lesions or wounds. Neuro: Alert and oriented times 3; grossly nonfocal.  Psych: Insight is good and judgment is appropriate. Mood and affect normal.   Data Review:    Labs: Basic Metabolic Panel:  Recent Labs Lab 06/25/16 1137  NA 143  K 4.1  CL 106  CO2 28  GLUCOSE 69  BUN 15  CREATININE 0.77  CALCIUM 9.5   Liver Function Tests: No results for input(s): AST, ALT, ALKPHOS, BILITOT, PROT, ALBUMIN in the last 168 hours. No results for input(s): LIPASE, AMYLASE in the last 168 hours. No results for input(s): AMMONIA in the last 168 hours. CBC:  Recent Labs Lab 06/25/16 1137  WBC 4.8  NEUTROABS 2.8  HGB 12.6  HCT 39.8  MCV 90.9  PLT 177   Cardiac Enzymes: No results for input(s): CKTOTAL, CKMB, CKMBINDEX, TROPONINI in the last 168 hours.  BNP (last 3 results) No results for input(s): PROBNP in the last 8760 hours. CBG: No results for input(s): GLUCAP in the last 168 hours.  Urinalysis    Component Value Date/Time   COLORURINE YELLOW 06/25/2016 1201   APPEARANCEUR CLOUDY (A) 06/25/2016 1201   LABSPEC 1.010 06/25/2016 1201   PHURINE 6.0 06/25/2016 1201   GLUCOSEU NEGATIVE 06/25/2016 1201   HGBUR MODERATE (A) 06/25/2016 1201   BILIRUBINUR NEGATIVE 06/25/2016 1201   KETONESUR NEGATIVE 06/25/2016 1201   PROTEINUR 100 (A) 06/25/2016 1201   UROBILINOGEN 1.0 01/13/2012 1703   NITRITE POSITIVE (A) 06/25/2016 1201   LEUKOCYTESUR MODERATE (A) 06/25/2016 1201      Radiographic Studies: Dg Chest 2 View  Result Date: 06/25/2016 CLINICAL DATA:  Two days of shortness of breath, cough, and chest congestion. The patient has an asthma an uses an inhaler at home. Former smoker. EXAM: CHEST  2 VIEW COMPARISON:  Chest x-ray of July 10, 2008 FINDINGS: The lungs are adequately inflated. The interstitial markings are increased bilaterally. There is no alveolar infiltrate. There is no pleural effusion. The heart and  pulmonary vascularity are normal. The mediastinum is normal in width. The bony thorax exhibits no acute abnormality. There is mild multilevel degenerative disc disease of the thoracic spine. IMPRESSION: Chronic bronchitic changes. Increased interstitial markings over baseline may reflect acute bronchitis or early interstitial pneumonia. No CHF. Followup PA and lateral chest X-ray is recommended in 3-4 weeks following trial of antibiotic therapy to ensure resolution and exclude underlying malignancy. Electronically Signed   By: David  Martinique M.D.   On: 06/25/2016 12:10    EKG: None ordered.   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 We will gently hydrate her and if her nausea/vomiting resolved, she can be transitioned to oral therapy and possibly discharged home tomorrow. We'll also start her on Solu-Medrol 60 mg IV every 12 hours, DuoNeb's every 6 hours with albuterol every 2 hours as needed for wheezing. We'll check a respiratory virus panel, sputum cultures, blood cultures, strep pneumonia antigen and HIV. Will treat with empiric Rocephin/azithromycin. Will need follow-up chest x-ray 3-4 weeks to exclude underlying pathology per radiology report.  Active Problems:   UTI (urinary tract infection) Rocephin will cover. Follow-up urine cultures.    Hyperlipidemia Continue statin.    Diabetes mellitus ([place]) Continue outpatient insulin regimen. Will add sliding scale insulin given treatment with steroids as this will likely exacerbate hyperglycemia.    Hypertension Continue lisinopril/HCTZ.    Tobacco abuse Counseled. Will have nurse reinforce with tobacco cessation counseling.    Polypharmacy Patient is on multiple sedating medications. Monitor closely for respiratory depression.   Other information:   DVT prophylaxis: Lovenox  ordered. Code Status: Full code. Family Communication: No family at the bedside.  Disposition Plan: Home in 24-48 hours. Consults called: None. Admission status: Observation.  The medical decision making on this patient was of high complexity and the patient is at high risk for clinical deterioration, therefore this is a level 3 visit.  The medical decision making is of moderate complexity, therefore this is a level 2 visit.  Kiyanna Biegler [facility] Pager [phone] Cell: [phone]   If 7PM-7AM, please contact night-coverage [email or web address] Password [facility] 06/25/2016, 2:37 PM

## 2016-06-25 NOTE — Progress Notes (Signed)
Pt informed Cm York Ram was her pcp but the office closed and she now does not have an assigned medicaid pcp CM discussed the need for her to review the available medicaid [place] pcps from a list provided to her by Cm, [employer] or a list from [place] to preferrably choose another pcp vs being assigned one Provided also DSS address, # and medicaid transportation #  Cm encouraged to to contact DSS for assistance prior to dc to get assist to share with discharging MD  Pt agreed she would do so Pt given resources in her hand and was reviewing them as CM left her room

## 2016-06-25 NOTE — ED Provider Notes (Signed)
[facility] DEPT Provider Note   CSN: [id number] Arrival date & time: 06/25/16  [id number]     History   Chief Complaint Chief Complaint  Patient presents with  . Asthma    HPI Gina [name] is a 50 y.o. female.  The history is provided by the patient and medical records. No language interpreter was used.    Gina [name] is a 50 y.o. female  with a PMH of asthma, HTN, HLD, DM, IBS, fibromyalgia who presents to the Emergency Department complaining of Worsening shortness of breath 2 days associated with cough productive of green sputum and wheezing. She also endorses a headache consistent with her typical migraine headaches and nausea during this timeframe as well. She has tried her albuterol inhaler and nebulizer at home with little relief. She states she has had one prior hospitalization for her asthma which is approximately 20 years ago. Denies fever, chest pain, abdominal pain.   States she has a hx of UTI's. She has no dysuria, urgency, frequency but does endorse a foul-smelling odor of the urine.   Past Medical History:  Diagnosis Date  . Arthritis   . Asthma   . Chronic pain   . Diabetes mellitus   . Fibromyalgia   . Hyperlipidemia   . Hypertension   . IBS (irritable bowel syndrome)   . Kidney stone   . Obesities, morbid ([place])   . Sinusitis     Patient Active Problem List   Diagnosis Date Noted  . Shortness of breath 06/25/2016  . Eosinophilia 04/23/2014    Past Surgical History:  Procedure Laterality Date  . ABDOMINAL HYSTERECTOMY    . CARPAL TUNNEL RELEASE    . CESAREAN SECTION    . CHOLECYSTECTOMY    . CYSTECTOMY    . LITHOTRIPSY      OB History    No data available       Home Medications    Prior to Admission medications   Medication Sig Start Date End Date Taking? Authorizing Provider  albuterol (PROVENTIL HFA;VENTOLIN HFA) 108 (90 BASE) MCG/ACT inhaler Inhale 2 puffs into the lungs every 6 (six) hours as needed for wheezing or  shortness of breath.    Yes Historical Provider, MD  albuterol (PROVENTIL) (2.5 MG/3ML) 0.083% nebulizer solution Take 2.5 mg by nebulization every 6 (six) hours as needed for wheezing or shortness of breath.   Yes Historical Provider, MD  allopurinol (ZYLOPRIM) 300 MG tablet Take 300 mg by mouth daily. 01/20/14  Yes Historical Provider, MD  ALPRAZolam Duanne Moron) 1 MG tablet Take 1 mg by mouth 2 (two) times daily as needed for anxiety.    Yes Historical Provider, MD  Aspirin-Salicylamide-Caffeine (BC HEADACHE POWDER PO) Take 1 packet by mouth every 4 (four) hours as needed (for pain).   Yes Historical Provider, MD  atorvastatin (LIPITOR) 20 MG tablet Take 20 mg by mouth daily.   Yes Historical Provider, MD  butalbital-acetaminophen-caffeine (FIORICET, ESGIC) 50-325-40 MG tablet Take 1 tablet by mouth daily as needed for headache. 05/22/16  Yes Historical Provider, MD  cyclobenzaprine (FLEXERIL) 10 MG tablet Take 10 mg by mouth 3 (three) times daily as needed for muscle spasms.   Yes Historical Provider, MD  ferrous sulfate 325 (65 FE) MG tablet Take 325 mg by mouth daily with breakfast.   Yes Historical Provider, MD  fluticasone (FLONASE) 50 MCG/ACT nasal spray Place 1 spray into both nostrils daily. 06/18/16  Yes Historical Provider, MD  ibuprofen (ADVIL,MOTRIN) 200 MG tablet Take 600  mg by mouth every 6 (six) hours as needed for fever, headache, mild pain, moderate pain or cramping.   Yes Historical Provider, MD  insulin glargine (LANTUS) 100 UNIT/ML injection Inject 20 Units into the skin at bedtime.    Yes Historical Provider, MD  lisinopril-hydrochlorothiazide (PRINZIDE,ZESTORETIC) 20-25 MG tablet Take 1 tablet by mouth daily. 05/18/16  Yes Historical Provider, MD  metFORMIN (GLUCOPHAGE) 500 MG tablet Take 500 mg by mouth 2 (two) times daily with a meal.   Yes Historical Provider, MD  Multiple Vitamin (MULTIVITAMIN WITH MINERALS) TABS tablet Take 1 tablet by mouth daily.   Yes Historical Provider, MD    NOVOLOG MIX 70/30 (70-30) 100 UNIT/ML injection Inject 30 Units into the skin 3 (three) times daily with meals. 06/05/16  Yes Historical Provider, MD  omeprazole (PRILOSEC) 20 MG capsule Take 20 mg by mouth daily with breakfast.    Yes Historical Provider, MD  oxyCODONE-acetaminophen (PERCOCET) 10-325 MG tablet Take 1 tablet by mouth 3 (three) times daily. 05/02/16  Yes Historical Provider, MD  pregabalin (LYRICA) 200 MG capsule Take 200 mg by mouth 2 (two) times daily.   Yes Historical Provider, MD  promethazine (PHENERGAN) 25 MG tablet Take 1 tablet by mouth 2 (two) times daily as needed for nausea/vomiting. 05/22/16  Yes Historical Provider, MD    Family History Family History  Problem Relation Age of Onset  . Cancer Mother   . Heart failure Father   . Diabetes Father   . Hypertension Father     Social History Social History  Substance Use Topics  . Smoking status: Former Research scientist (life sciences)  . Smokeless tobacco: Never Used  . Alcohol use No     Allergies   Penicillins; Darvocet [propoxyphene n-acetaminophen]; and Morphine and related   Review of Systems Review of Systems  Constitutional: Negative for chills and fever.  HENT: Positive for congestion.   Eyes: Negative for visual disturbance.  Respiratory: Positive for cough, shortness of breath and wheezing.   Cardiovascular: Negative.   Gastrointestinal: Positive for nausea. Negative for abdominal pain and vomiting.  Genitourinary: Negative for dysuria, frequency and urgency.  Musculoskeletal: Negative for neck pain.  Skin: Negative for color change.  Neurological: Positive for headaches.     Physical Exam Updated Vital Signs BP 148/77 (BP Location: Left Wrist)   Pulse 100   Resp 19   SpO2 100%   Physical Exam  Constitutional: She is oriented to person, place, and time.  WDWN obese female.   HENT:  Head: Normocephalic and atraumatic.  Neck: Normal range of motion. Neck supple.  Cardiovascular: Normal rate, regular  rhythm and normal heart sounds.   No murmur heard. Pulmonary/Chest:  Increased effort in breathing, continuous neb in place. Expiratory wheezing in all lung fields.   Abdominal: Soft. She exhibits no distension. There is no tenderness.  Lymphadenopathy:    She has no cervical adenopathy.  Neurological: She is alert and oriented to person, place, and time.  Skin: Skin is warm and dry. Capillary refill takes 2 to 3 seconds.  Nursing note and vitals reviewed.    ED Treatments / Results  Labs (all labs ordered are listed, but only abnormal results are displayed) Labs Reviewed  CBC WITH DIFFERENTIAL/PLATELET - Abnormal; Notable for the following:       Result Value   RDW 15.8 (*)    All other components within normal limits  URINALYSIS, ROUTINE W REFLEX MICROSCOPIC (NOT AT [facility]) - Abnormal; Notable for the following:    APPearance CLOUDY (*)  Hgb urine dipstick MODERATE (*)    Protein, ur 100 (*)    Nitrite POSITIVE (*)    Leukocytes, UA MODERATE (*)    All other components within normal limits  URINE MICROSCOPIC-ADD ON - Abnormal; Notable for the following:    Squamous Epithelial / LPF 0-5 (*)    Bacteria, UA MANY (*)    All other components within normal limits  URINE CULTURE  BASIC METABOLIC PANEL    EKG  EKG Interpretation None       Radiology Dg Chest 2 View  Result Date: 06/25/2016 CLINICAL DATA:  Two days of shortness of breath, cough, and chest congestion. The patient has an asthma an uses an inhaler at home. Former smoker. EXAM: CHEST  2 VIEW COMPARISON:  Chest x-ray of July 10, 2008 FINDINGS: The lungs are adequately inflated. The interstitial markings are increased bilaterally. There is no alveolar infiltrate. There is no pleural effusion. The heart and pulmonary vascularity are normal. The mediastinum is normal in width. The bony thorax exhibits no acute abnormality. There is mild multilevel degenerative disc disease of the thoracic spine. IMPRESSION:  Chronic bronchitic changes. Increased interstitial markings over baseline may reflect acute bronchitis or early interstitial pneumonia. No CHF. Followup PA and lateral chest X-ray is recommended in 3-4 weeks following trial of antibiotic therapy to ensure resolution and exclude underlying malignancy. Electronically Signed   By: David  Martinique M.D.   On: 06/25/2016 12:10    Procedures Procedures (including critical care time)  Medications Ordered in ED Medications  cefTRIAXone (ROCEPHIN) 1 g in dextrose 5 % 50 mL IVPB (not administered)  azithromycin (ZITHROMAX) 500 mg in dextrose 5 % 250 mL IVPB (not administered)  albuterol (PROVENTIL,VENTOLIN) solution continuous neb (10 mg/hr Nebulization Given 06/25/16 1040)  sodium chloride 0.9 % bolus 1,000 mL (0 mLs Intravenous Stopped 06/25/16 1247)  metoCLOPramide (REGLAN) injection 10 mg (10 mg Intravenous Given 06/25/16 1143)  methylPREDNISolone sodium succinate (SOLU-MEDROL) 125 mg/2 mL injection 125 mg (125 mg Intravenous Given 06/25/16 1147)     Initial Impression / Assessment and Plan / ED Course  I have reviewed the triage vital signs and the nursing notes.  Pertinent labs & imaging results that were available during my care of the patient were reviewed by me and considered in my medical decision making (see chart for details).  Clinical Course    Gina [name] is a 50 y.o. female who presents to ED for worsening wheezing and shortness of breath x 2 days. Not on oxygen at home and no hospitalization for asthma in approx. 20 years. On exam, patient with increased effort of breathing and expiratory wheezing bilaterally. She was placed on a continuous neb treatment.   Labs reviewed and reassuring.   12:49 PM - Patient re-evaluated following continuous neb. Not on O2 at home and drops to 90% while sitting at rest on RA.   CXR with increased interstitial markings bilaterally.? Acute bronchitis versus early interstitial pneumonia. UA with  nitrite-positive urine and TNTC white cells.  Patient started on azithromycin and Rocephin to cover for UTI and possible PNA. Urine sent for culture.  Consult to hospitalist who will admit.   Patient seen by and discussed with Dr. Regenia Skeeter who agrees with treatment plan.   Final Clinical Impressions(s) / ED Diagnoses   Final diagnoses:  None    New Prescriptions New Prescriptions   No medications on file     [facility] Tyller Bowlby, PA-C 06/25/16 1346    Sherwood Gambler, MD  06/29/16 1640  

## 2016-06-25 NOTE — ED Notes (Signed)
Patient transported to X-ray 

## 2016-06-26 DIAGNOSIS — G43909 Migraine, unspecified, not intractable, without status migrainosus: Secondary | ICD-10-CM | POA: Diagnosis present

## 2016-06-26 DIAGNOSIS — Z87440 Personal history of urinary (tract) infections: Secondary | ICD-10-CM | POA: Diagnosis not present

## 2016-06-26 DIAGNOSIS — Z79899 Other long term (current) drug therapy: Secondary | ICD-10-CM | POA: Diagnosis not present

## 2016-06-26 DIAGNOSIS — Z8249 Family history of ischemic heart disease and other diseases of the circulatory system: Secondary | ICD-10-CM | POA: Diagnosis not present

## 2016-06-26 DIAGNOSIS — K589 Irritable bowel syndrome without diarrhea: Secondary | ICD-10-CM | POA: Diagnosis present

## 2016-06-26 DIAGNOSIS — B9620 Unspecified Escherichia coli [E. coli] as the cause of diseases classified elsewhere: Secondary | ICD-10-CM | POA: Diagnosis present

## 2016-06-26 DIAGNOSIS — J45901 Unspecified asthma with (acute) exacerbation: Secondary | ICD-10-CM | POA: Diagnosis present

## 2016-06-26 DIAGNOSIS — N390 Urinary tract infection, site not specified: Secondary | ICD-10-CM | POA: Diagnosis present

## 2016-06-26 DIAGNOSIS — J849 Interstitial pulmonary disease, unspecified: Secondary | ICD-10-CM | POA: Diagnosis present

## 2016-06-26 DIAGNOSIS — R0602 Shortness of breath: Secondary | ICD-10-CM | POA: Diagnosis present

## 2016-06-26 DIAGNOSIS — Z79891 Long term (current) use of opiate analgesic: Secondary | ICD-10-CM | POA: Diagnosis not present

## 2016-06-26 DIAGNOSIS — J9601 Acute respiratory failure with hypoxia: Secondary | ICD-10-CM | POA: Diagnosis present

## 2016-06-26 DIAGNOSIS — Z87442 Personal history of urinary calculi: Secondary | ICD-10-CM | POA: Diagnosis not present

## 2016-06-26 DIAGNOSIS — E1165 Type 2 diabetes mellitus with hyperglycemia: Secondary | ICD-10-CM | POA: Diagnosis present

## 2016-06-26 DIAGNOSIS — I10 Essential (primary) hypertension: Secondary | ICD-10-CM | POA: Diagnosis present

## 2016-06-26 DIAGNOSIS — Z23 Encounter for immunization: Secondary | ICD-10-CM | POA: Diagnosis present

## 2016-06-26 DIAGNOSIS — E118 Type 2 diabetes mellitus with unspecified complications: Secondary | ICD-10-CM | POA: Diagnosis not present

## 2016-06-26 DIAGNOSIS — Z833 Family history of diabetes mellitus: Secondary | ICD-10-CM | POA: Diagnosis not present

## 2016-06-26 DIAGNOSIS — Z6841 Body Mass Index (BMI) 40.0 and over, adult: Secondary | ICD-10-CM | POA: Diagnosis not present

## 2016-06-26 DIAGNOSIS — J4521 Mild intermittent asthma with (acute) exacerbation: Secondary | ICD-10-CM | POA: Diagnosis not present

## 2016-06-26 DIAGNOSIS — Z87891 Personal history of nicotine dependence: Secondary | ICD-10-CM | POA: Diagnosis not present

## 2016-06-26 DIAGNOSIS — Z794 Long term (current) use of insulin: Secondary | ICD-10-CM | POA: Diagnosis not present

## 2016-06-26 DIAGNOSIS — E662 Morbid (severe) obesity with alveolar hypoventilation: Secondary | ICD-10-CM | POA: Diagnosis present

## 2016-06-26 DIAGNOSIS — M797 Fibromyalgia: Secondary | ICD-10-CM | POA: Diagnosis present

## 2016-06-26 DIAGNOSIS — E785 Hyperlipidemia, unspecified: Secondary | ICD-10-CM | POA: Diagnosis present

## 2016-06-26 LAB — GLUCOSE, CAPILLARY
GLUCOSE-CAPILLARY: 256 mg/dL — AB (ref 65–99)
GLUCOSE-CAPILLARY: 202 mg/dL — AB (ref 65–99)
Glucose-Capillary: 247 mg/dL — ABNORMAL HIGH (ref 65–99)
Glucose-Capillary: 276 mg/dL — ABNORMAL HIGH (ref 65–99)

## 2016-06-26 LAB — RESPIRATORY PANEL BY PCR
ADENOVIRUS-RVPPCR: NOT DETECTED
Bordetella pertussis: NOT DETECTED
CORONAVIRUS NL63-RVPPCR: NOT DETECTED
CORONAVIRUS OC43-RVPPCR: NOT DETECTED
Chlamydophila pneumoniae: NOT DETECTED
Coronavirus 229E: NOT DETECTED
Coronavirus HKU1: NOT DETECTED
INFLUENZA A-RVPPCR: NOT DETECTED
INFLUENZA B-RVPPCR: NOT DETECTED
METAPNEUMOVIRUS-RVPPCR: NOT DETECTED
Mycoplasma pneumoniae: NOT DETECTED
PARAINFLUENZA VIRUS 1-RVPPCR: NOT DETECTED
PARAINFLUENZA VIRUS 2-RVPPCR: NOT DETECTED
PARAINFLUENZA VIRUS 4-RVPPCR: NOT DETECTED
Parainfluenza Virus 3: NOT DETECTED
RESPIRATORY SYNCYTIAL VIRUS-RVPPCR: NOT DETECTED
Rhinovirus / Enterovirus: NOT DETECTED

## 2016-06-26 LAB — STREP PNEUMONIAE URINARY ANTIGEN: Strep Pneumo Urinary Antigen: NEGATIVE

## 2016-06-26 LAB — EXPECTORATED SPUTUM ASSESSMENT W REFEX TO RESP CULTURE

## 2016-06-26 LAB — HIV ANTIBODY (ROUTINE TESTING W REFLEX): HIV Screen 4th Generation wRfx: NONREACTIVE

## 2016-06-26 LAB — EXPECTORATED SPUTUM ASSESSMENT W GRAM STAIN, RFLX TO RESP C

## 2016-06-26 MED ORDER — PROMETHAZINE HCL 25 MG/ML IJ SOLN
12.5000 mg | Freq: Four times a day (QID) | INTRAMUSCULAR | Status: DC | PRN
  Administered 2016-06-26 – 2016-06-27 (×4): 12.5 mg via INTRAVENOUS
  Filled 2016-06-26 (×4): qty 1

## 2016-06-26 MED ORDER — INSULIN ASPART 100 UNIT/ML ~~LOC~~ SOLN
0.0000 [IU] | Freq: Three times a day (TID) | SUBCUTANEOUS | Status: DC
  Administered 2016-06-26: 11 [IU] via SUBCUTANEOUS
  Administered 2016-06-27: 4 [IU] via SUBCUTANEOUS
  Administered 2016-06-27: 7 [IU] via SUBCUTANEOUS
  Administered 2016-06-27: 11 [IU] via SUBCUTANEOUS
  Administered 2016-06-28: 4 [IU] via SUBCUTANEOUS
  Administered 2016-06-28: 11 [IU] via SUBCUTANEOUS
  Administered 2016-06-28 – 2016-06-29 (×2): 4 [IU] via SUBCUTANEOUS

## 2016-06-26 MED ORDER — PROMETHAZINE HCL 25 MG/ML IJ SOLN
12.5000 mg | Freq: Once | INTRAMUSCULAR | Status: AC
  Administered 2016-06-26: 12.5 mg via INTRAVENOUS
  Filled 2016-06-26: qty 1

## 2016-06-26 MED ORDER — INSULIN ASPART 100 UNIT/ML ~~LOC~~ SOLN
3.0000 [IU] | Freq: Three times a day (TID) | SUBCUTANEOUS | Status: DC
  Administered 2016-06-26 – 2016-06-29 (×8): 3 [IU] via SUBCUTANEOUS

## 2016-06-26 MED ORDER — DM-GUAIFENESIN ER 30-600 MG PO TB12
1.0000 | ORAL_TABLET | Freq: Two times a day (BID) | ORAL | Status: DC
  Administered 2016-06-26 – 2016-06-29 (×6): 1 via ORAL
  Filled 2016-06-26 (×6): qty 1

## 2016-06-26 NOTE — Progress Notes (Signed)
PROGRESS NOTE    Gina [name]  [id number] DOB: Jan 10, 1966 DOA: 06/25/2016 PCP: Harvie Junior, MD   Brief Narrative: Gina [name] is an 50 y.o. female with a PMH of asthma, HTN, HLD, DM, IBS, fibromyalgia whopresents To the ED complaining of a 2 day history of worsening shortness of breath associated with a mostly nonproductive cough, but did report occasional green-gray sputum and significant wheezing. CXR shows interstitial pneumonia. She was started on IV antibiotics and IV steroids.   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  Currently requiring 2 lit of [place] OXYGEN. And she was started on IV rocephin and IV zithromax along with IV steroids.  Breathing improved.  Sputum cultures ordered and pending.    UTI:  Cultures are pending, grew 1,00,000 gram negative rods.  Resume IV antibiotics.    diabetes mellitus: CBG (last 3)   Recent Labs  06/25/16 2146 06/26/16 0737 06/26/16 1147  GLUCAP 246* 247* 256*    hgba1c pending.  Add premeal coverage and increase SSI to resistant scale.   Hypertensin  Well controlled.       DVT prophylaxis: (Lovenox) Code Status: full code.  Family Communication: none at bedside.  Disposition Plan: 1 to 2 days.   Consultants:  None   Procedures: none    Antimicrobials:rocephin and ZITHROMAX on 11/16   Subjective: Still having difficulty breathing, but better than yesterday.   Objective: Vitals:   06/26/16 0835 06/26/16 1400 06/26/16 1412 06/26/16 1416  BP: 136/61 (!) 128/56    Pulse: 99 92    Resp: 18 18    Temp: 98.7 F (37.1 C) 97.8 F (36.6 C)    TempSrc: Oral Oral    SpO2: 95% 97% 97% 97%    Weight:      Height:        Intake/Output Summary (Last 24 hours) at 06/26/16 1520 Last data filed at 06/26/16 1320  Gross per 24 hour  Intake              960 ml  Output                0 ml  Net              960 ml   Filed Weights   06/25/16 1528  Weight: (!) 145 kg (319 lb 10.7 oz)    Examination:  General exam: Appears calm and comfortable  On 2lit of [place] oxygen.  Respiratory system:  Air entry poor, scattered wheezing, increased work of breathing Cardiovascular system: S1 & S2 heard, RRR. No JVD, murmurs, rubs, gallops or clicks. No pedal edema. Gastrointestinal system: Abdomen is nondistended, soft and nontender. No organomegaly or masses felt. Normal bowel sounds heard. Central nervous system: Alert and oriented. No focal neurological deficits. Extremities: Symmetric 5 x 5 power. Skin: No rashes, lesions or ulcers Psychiatry: Judgement and insight appear normal. Mood & affect appropriate.     Data Reviewed: I have personally reviewed following labs and imaging studies  CBC:  Recent Labs Lab 06/25/16 1137  WBC 4.8  NEUTROABS 2.8  HGB 12.6  HCT 39.8  MCV 90.9  PLT 123XX123   Basic Metabolic Panel:  Recent Labs Lab  06/25/16 1137  NA 143  K 4.1  CL 106  CO2 28  GLUCOSE 69  BUN 15  CREATININE 0.77  CALCIUM 9.5   GFR: Estimated Creatinine Clearance: 116.1 mL/min (by C-G formula based on SCr of 0.77 mg/dL). Liver Function Tests: No results for input(s): AST, ALT, ALKPHOS, BILITOT, PROT, ALBUMIN in the last 168 hours. No results for input(s): LIPASE, AMYLASE in the last 168 hours. No results for input(s): AMMONIA in the last 168 hours. Coagulation Profile: No results for input(s): INR, PROTIME in the last 168 hours. Cardiac Enzymes: No results for input(s): CKTOTAL, CKMB, CKMBINDEX, TROPONINI in the last 168 hours. BNP (last 3 results) No results for input(s): PROBNP in the last 8760 hours. HbA1C: No results for input(s): HGBA1C in the last 72  hours. CBG:  Recent Labs Lab 06/25/16 1642 06/25/16 2146 06/26/16 0737 06/26/16 1147  GLUCAP 173* 246* 247* 256*   Lipid Profile: No results for input(s): CHOL, HDL, LDLCALC, TRIG, CHOLHDL, LDLDIRECT in the last 72 hours. Thyroid Function Tests: No results for input(s): TSH, T4TOTAL, FREET4, T3FREE, THYROIDAB in the last 72 hours. Anemia Panel: No results for input(s): VITAMINB12, FOLATE, FERRITIN, TIBC, IRON, RETICCTPCT in the last 72 hours. Sepsis Labs: No results for input(s): PROCALCITON, LATICACIDVEN in the last 168 hours.  Recent Results (from the past 240 hour(s))  Urine culture     Status: Abnormal (Preliminary result)   Collection Time: 06/25/16 12:01 PM  Result Value Ref Range Status   Specimen Description URINE, CLEAN CATCH  Final   Special Requests NONE  Final   Culture >=100,000 COLONIES/mL GRAM NEGATIVE RODS (A)  Final   Report Status PENDING  Incomplete  Respiratory Panel by PCR     Status: None   Collection Time: 06/25/16  4:00 PM  Result Value Ref Range Status   Adenovirus NOT DETECTED NOT DETECTED Final   Coronavirus 229E NOT DETECTED NOT DETECTED Final   Coronavirus HKU1 NOT DETECTED NOT DETECTED Final   Coronavirus NL63 NOT DETECTED NOT DETECTED Final   Coronavirus OC43 NOT DETECTED NOT DETECTED Final   Metapneumovirus NOT DETECTED NOT DETECTED Final   Rhinovirus / Enterovirus NOT DETECTED NOT DETECTED Final   Influenza A NOT DETECTED NOT DETECTED Final   Influenza B NOT DETECTED NOT DETECTED Final   Parainfluenza Virus 1 NOT DETECTED NOT DETECTED Final   Parainfluenza Virus 2 NOT DETECTED NOT DETECTED Final   Parainfluenza Virus 3 NOT DETECTED NOT DETECTED Final   Parainfluenza Virus 4 NOT DETECTED NOT DETECTED Final   Respiratory Syncytial Virus NOT DETECTED NOT DETECTED Final   Bordetella pertussis NOT DETECTED NOT DETECTED Final   Chlamydophila pneumoniae NOT DETECTED NOT DETECTED Final   Mycoplasma pneumoniae NOT DETECTED NOT DETECTED Final     Comment: Performed at [facility]  MRSA PCR Screening     Status: None   Collection Time: 06/25/16  4:02 PM  Result Value Ref Range Status   MRSA by PCR NEGATIVE NEGATIVE Final    Comment:        The GeneXpert MRSA Assay (FDA approved for NASAL specimens only), is one component of a comprehensive MRSA colonization surveillance program. It is not intended to diagnose MRSA infection nor to guide or monitor treatment for MRSA infections.   Culture, sputum-assessment     Status: None   Collection Time: 06/26/16 12:37 PM  Result Value Ref Range Status   Specimen Description SPUTUM  Final   Special Requests NONE  Final   Sputum evaluation  Final    THIS SPECIMEN IS ACCEPTABLE. RESPIRATORY CULTURE REPORT TO FOLLOW.   Report Status 06/26/2016 FINAL  Final         Radiology Studies: Dg Chest 2 View  Result Date: 06/25/2016 CLINICAL DATA:  Two days of shortness of breath, cough, and chest congestion. The patient has an asthma an uses an inhaler at home. Former smoker. EXAM: CHEST  2 VIEW COMPARISON:  Chest x-ray of July 10, 2008 FINDINGS: The lungs are adequately inflated. The interstitial markings are increased bilaterally. There is no alveolar infiltrate. There is no pleural effusion. The heart and pulmonary vascularity are normal. The mediastinum is normal in width. The bony thorax exhibits no acute abnormality. There is mild multilevel degenerative disc disease of the thoracic spine. IMPRESSION: Chronic bronchitic changes. Increased interstitial markings over baseline may reflect acute bronchitis or early interstitial pneumonia. No CHF. Followup PA and lateral chest X-ray is recommended in 3-4 weeks following trial of antibiotic therapy to ensure resolution and exclude underlying malignancy. Electronically Signed   By: David  Martinique M.D.   On: 06/25/2016 12:10        Scheduled Meds: . allopurinol  300 mg Oral Daily  . atorvastatin  20 mg Oral Daily  . azithromycin   500 mg Intravenous Q24H  . cefTRIAXone (ROCEPHIN)  IV  1 g Intravenous Q24H  . enoxaparin (LOVENOX) injection  40 mg Subcutaneous Q24H  . ferrous sulfate  325 mg Oral Q breakfast  . fluticasone  1 spray Each Nare Daily  . lisinopril  20 mg Oral Daily   And  . hydrochlorothiazide  25 mg Oral Daily  . Influenza vac split quadrivalent PF  0.5 mL Intramuscular Tomorrow-1000  . insulin aspart  0-15 Units Subcutaneous TID WC  . insulin aspart  0-5 Units Subcutaneous QHS  . insulin aspart protamine- aspart  30 Units Subcutaneous TID WC  . insulin glargine  20 Units Subcutaneous QHS  . ipratropium-albuterol  3 mL Nebulization Q6H  . methylPREDNISolone (SOLU-MEDROL) injection  60 mg Intravenous Q12H  . multivitamin with minerals  1 tablet Oral Daily  . pantoprazole  40 mg Oral Daily  . pneumococcal 23 valent vaccine  0.5 mL Intramuscular Tomorrow-1000  . pregabalin  200 mg Oral BID  . sodium chloride flush  3 mL Intravenous Q12H   Continuous Infusions:   LOS: 0 days    Time spent: 30 minutes.     Hosie Poisson, MD [facility] Pager [phone]  If 7PM-7AM, please contact night-coverage [email or web address] Password [email or web address] 06/26/2016, 3:20 PM

## 2016-06-27 LAB — URINE CULTURE

## 2016-06-27 LAB — GLUCOSE, CAPILLARY
GLUCOSE-CAPILLARY: 287 mg/dL — AB (ref 65–99)
GLUCOSE-CAPILLARY: 244 mg/dL — AB (ref 65–99)
GLUCOSE-CAPILLARY: 175 mg/dL — AB (ref 65–99)

## 2016-06-27 MED ORDER — OXYCODONE-ACETAMINOPHEN 5-325 MG PO TABS
1.0000 | ORAL_TABLET | ORAL | Status: DC | PRN
  Administered 2016-06-27 – 2016-06-29 (×10): 1 via ORAL
  Filled 2016-06-27 (×10): qty 1

## 2016-06-27 MED ORDER — OXYCODONE HCL 5 MG PO TABS
5.0000 mg | ORAL_TABLET | Freq: Three times a day (TID) | ORAL | Status: DC | PRN
  Administered 2016-06-27 – 2016-06-29 (×6): 5 mg via ORAL
  Filled 2016-06-27 (×6): qty 1

## 2016-06-27 MED ORDER — PROMETHAZINE HCL 25 MG/ML IJ SOLN
12.5000 mg | INTRAMUSCULAR | Status: DC | PRN
  Administered 2016-06-27 – 2016-06-28 (×4): 12.5 mg via INTRAVENOUS
  Filled 2016-06-27 (×4): qty 1

## 2016-06-27 MED ORDER — AZITHROMYCIN 250 MG PO TABS
500.0000 mg | ORAL_TABLET | Freq: Every day | ORAL | Status: DC
  Administered 2016-06-27 – 2016-06-29 (×3): 500 mg via ORAL
  Filled 2016-06-27 (×3): qty 2

## 2016-06-27 NOTE — Progress Notes (Signed)
PHARMACIST - PHYSICIAN COMMUNICATION DR:   Karleen Hampshire CONCERNING: Antibiotic IV to Oral Route Change Policy  RECOMMENDATION: This patient is receiving Zithromax by the intravenous route.  Based on criteria approved by the Pharmacy and Therapeutics Committee, the antibiotic(s) is/are being converted to the equivalent oral dose form(s).   DESCRIPTION: These criteria include:  Patient being treated for a respiratory tract infection, urinary tract infection, cellulitis or clostridium difficile associated diarrhea if on metronidazole  The patient is not neutropenic and does not exhibit a GI malabsorption state  The patient is eating (either orally or via tube) and/or has been taking other orally administered medications for a least 24 hours  The patient is improving clinically and has a Tmax < 100.5  If you have questions about this conversion, please contact the Pharmacy Department  []   [phone] )  [facility] []   [phone] )  [facility] []   [phone] )  [facility] []   [phone] )  [facility] [x]   [phone] )  [place], PharmD, BCPS Pager: [phone] 06/27/2016@9 :42 AM

## 2016-06-28 LAB — GLUCOSE, CAPILLARY
GLUCOSE-CAPILLARY: 256 mg/dL — AB (ref 65–99)
GLUCOSE-CAPILLARY: 156 mg/dL — AB (ref 65–99)
Glucose-Capillary: 195 mg/dL — ABNORMAL HIGH (ref 65–99)
Glucose-Capillary: 139 mg/dL — ABNORMAL HIGH (ref 65–99)

## 2016-06-28 LAB — CULTURE, RESPIRATORY: CULTURE: NORMAL

## 2016-06-28 LAB — HEMOGLOBIN A1C
Hgb A1c MFr Bld: 6.3 % — ABNORMAL HIGH (ref 4.8–5.6)
Mean Plasma Glucose: 134 mg/dL

## 2016-06-28 MED ORDER — PROMETHAZINE HCL 25 MG PO TABS
25.0000 mg | ORAL_TABLET | Freq: Four times a day (QID) | ORAL | Status: DC | PRN
  Administered 2016-06-28 – 2016-06-29 (×5): 25 mg via ORAL
  Filled 2016-06-28 (×5): qty 1

## 2016-06-28 MED ORDER — PREDNISONE 50 MG PO TABS
60.0000 mg | ORAL_TABLET | Freq: Every day | ORAL | Status: DC
  Administered 2016-06-29: 60 mg via ORAL
  Filled 2016-06-28: qty 1

## 2016-06-28 NOTE — Progress Notes (Signed)
PROGRESS NOTE    MLISSA ZUMMO  [id number] DOB: May 16, 1966 DOA: 06/25/2016 PCP: Harvie Junior, MD   Brief Narrative: Gina [name] is an 50 y.o. female with a PMH of asthma, HTN, HLD, DM, IBS, fibromyalgia whopresents To the ED complaining of a 2 day history of worsening shortness of breath associated with a mostly nonproductive cough, but did report occasional green-gray sputum and significant wheezing. CXR shows interstitial pneumonia. She was started on IV antibiotics and IV steroids.   Assessment & Plan:   Principal Problem:   Interstitial pneumonia ([place]) Active Problems:   Shortness of breath   UTI (urinary tract infection)   Hyperlipidemia   Diabetes mellitus ([place])   Hypertension   Asthma exacerbation   Polypharmacy  Acute respiratory failure with hypoxia from  Atypical interstitial pneumonia and acute asthma exacerbation.  Currently requiring 2 lit of [place] OXYGEN. And she was started on IV rocephin and IV zithromax along with IV steroids.  Breathing improved, wheezing has improved.  Tapered the IV steroids to po prednisone.  Weaned her to 1 lit of [place] oxygen.  Sputum cultures ordered and pending.    UTI:  Cultures are pending, grew 1,00,000  Ecoli sensitive to rocephin.  Resume IV antibiotics.    diabetes mellitus: CBG (last 3)   Recent Labs  06/28/16 0742 06/28/16 1239 06/28/16 1708  GLUCAP 195* 256* 156*    hgba1c 6.3  Added  premeal coverage and increase SSI to resistant scale.   Hypertensin  Well controlled.       DVT prophylaxis: (Lovenox) Code Status: full code.  Family Communication: none at bedside.  Disposition Plan: 1 to 2 days.   Consultants:  None   Procedures: none    Antimicrobials:rocephin and ZITHROMAX on 11/16   Subjective: Pain well controlled.   Objective: Vitals:   06/28/16 1047 06/28/16 1326 06/28/16 1513 06/28/16 1630  BP:  (!) 157/74    Pulse:  78    Resp:  16    Temp:  97.4 F (36.3 C)      TempSrc:  Oral    SpO2: 96% 92% 94% 98%  Weight:      Height:        Intake/Output Summary (Last 24 hours) at 06/28/16 1825 Last data filed at 06/28/16 0532  Gross per 24 hour  Intake              463 ml  Output                0 ml  Net              463 ml   Filed Weights   06/25/16 1528  Weight: (!) 145 kg (319 lb 10.7 oz)    Examination:  General exam: Appears calm and comfortable  On 1 lit of [place] oxygen.  Respiratory system:  Air entry poor, scattered wheezing, increased work of breathing Cardiovascular system: S1 & S2 heard, RRR. No JVD, murmurs, rubs, gallops or clicks. No pedal edema. Gastrointestinal system: Abdomen is nondistended, soft and nontender. No organomegaly or masses felt. Normal bowel sounds heard. Central nervous system: Alert and oriented. No focal neurological deficits. Extremities: Symmetric 5 x 5 power. Skin: No rashes, lesions or ulcers Psychiatry: Judgement and insight appear normal. Mood & affect appropriate.     Data Reviewed: I have personally reviewed following labs and imaging studies  CBC:  Recent Labs Lab 06/25/16 1137  WBC 4.8  NEUTROABS 2.8  HGB 12.6  HCT  39.8  MCV 90.9  PLT 123XX123   Basic Metabolic Panel:  Recent Labs Lab 06/25/16 1137  NA 143  K 4.1  CL 106  CO2 28  GLUCOSE 69  BUN 15  CREATININE 0.77  CALCIUM 9.5   GFR: Estimated Creatinine Clearance: 116.1 mL/min (by C-G formula based on SCr of 0.77 mg/dL). Liver Function Tests: No results for input(s): AST, ALT, ALKPHOS, BILITOT, PROT, ALBUMIN in the last 168 hours. No results for input(s): LIPASE, AMYLASE in the last 168 hours. No results for input(s): AMMONIA in the last 168 hours. Coagulation Profile: No results for input(s): INR, PROTIME in the last 168 hours. Cardiac Enzymes: No results for input(s): CKTOTAL, CKMB, CKMBINDEX, TROPONINI in the last 168 hours. BNP (last 3 results) No results for input(s): PROBNP in the last 8760 hours. HbA1C:  Recent  Labs  06/27/16 0518  HGBA1C 6.3*   CBG:  Recent Labs Lab 06/27/16 1258 06/27/16 1725 06/28/16 0742 06/28/16 1239 06/28/16 1708  GLUCAP 244* 175* 195* 256* 156*   Lipid Profile: No results for input(s): CHOL, HDL, LDLCALC, TRIG, CHOLHDL, LDLDIRECT in the last 72 hours. Thyroid Function Tests: No results for input(s): TSH, T4TOTAL, FREET4, T3FREE, THYROIDAB in the last 72 hours. Anemia Panel: No results for input(s): VITAMINB12, FOLATE, FERRITIN, TIBC, IRON, RETICCTPCT in the last 72 hours. Sepsis Labs: No results for input(s): PROCALCITON, LATICACIDVEN in the last 168 hours.  Recent Results (from the past 240 hour(s))  Urine culture     Status: Abnormal   Collection Time: 06/25/16 12:01 PM  Result Value Ref Range Status   Specimen Description URINE, CLEAN CATCH  Final   Special Requests NONE  Final   Culture >=100,000 COLONIES/mL ESCHERICHIA COLI (A)  Final   Report Status 06/27/2016 FINAL  Final   Organism ID, Bacteria ESCHERICHIA COLI (A)  Final      Susceptibility   Escherichia coli - MIC*    AMPICILLIN 4 SENSITIVE Sensitive     CEFAZOLIN <=4 SENSITIVE Sensitive     CEFTRIAXONE <=1 SENSITIVE Sensitive     CIPROFLOXACIN <=0.25 SENSITIVE Sensitive     GENTAMICIN <=1 SENSITIVE Sensitive     IMIPENEM <=0.25 SENSITIVE Sensitive     NITROFURANTOIN <=16 SENSITIVE Sensitive     TRIMETH/SULFA <=20 SENSITIVE Sensitive     AMPICILLIN/SULBACTAM <=2 SENSITIVE Sensitive     PIP/TAZO <=4 SENSITIVE Sensitive     Extended ESBL NEGATIVE Sensitive     * >=100,000 COLONIES/mL ESCHERICHIA COLI  Culture, blood (routine x 2) Call MD if unable to obtain prior to antibiotics being given     Status: None (Preliminary result)   Collection Time: 06/25/16  3:26 PM  Result Value Ref Range Status   Specimen Description BLOOD LEFT HAND  Final   Special Requests BOTTLES DRAWN AEROBIC AND ANAEROBIC 10CC  Final   Culture   Final    NO GROWTH 3 DAYS Performed at [facility]    Report  Status PENDING  Incomplete  Culture, blood (routine x 2) Call MD if unable to obtain prior to antibiotics being given     Status: None (Preliminary result)   Collection Time: 06/25/16  3:26 PM  Result Value Ref Range Status   Specimen Description BLOOD RIGHT HAND  Final   Special Requests BOTTLES DRAWN AEROBIC AND ANAEROBIC 8CC  Final   Culture   Final    NO GROWTH 3 DAYS Performed at [facility]    Report Status PENDING  Incomplete  Respiratory Panel by  PCR     Status: None   Collection Time: 06/25/16  4:00 PM  Result Value Ref Range Status   Adenovirus NOT DETECTED NOT DETECTED Final   Coronavirus 229E NOT DETECTED NOT DETECTED Final   Coronavirus HKU1 NOT DETECTED NOT DETECTED Final   Coronavirus NL63 NOT DETECTED NOT DETECTED Final   Coronavirus OC43 NOT DETECTED NOT DETECTED Final   Metapneumovirus NOT DETECTED NOT DETECTED Final   Rhinovirus / Enterovirus NOT DETECTED NOT DETECTED Final   Influenza A NOT DETECTED NOT DETECTED Final   Influenza B NOT DETECTED NOT DETECTED Final   Parainfluenza Virus 1 NOT DETECTED NOT DETECTED Final   Parainfluenza Virus 2 NOT DETECTED NOT DETECTED Final   Parainfluenza Virus 3 NOT DETECTED NOT DETECTED Final   Parainfluenza Virus 4 NOT DETECTED NOT DETECTED Final   Respiratory Syncytial Virus NOT DETECTED NOT DETECTED Final   Bordetella pertussis NOT DETECTED NOT DETECTED Final   Chlamydophila pneumoniae NOT DETECTED NOT DETECTED Final   Mycoplasma pneumoniae NOT DETECTED NOT DETECTED Final    Comment: Performed at [facility]  MRSA PCR Screening     Status: None   Collection Time: 06/25/16  4:02 PM  Result Value Ref Range Status   MRSA by PCR NEGATIVE NEGATIVE Final    Comment:        The GeneXpert MRSA Assay (FDA approved for NASAL specimens only), is one component of a comprehensive MRSA colonization surveillance program. It is not intended to diagnose MRSA infection nor to guide or monitor treatment for MRSA  infections.   Culture, sputum-assessment     Status: None   Collection Time: 06/26/16 12:37 PM  Result Value Ref Range Status   Specimen Description SPUTUM  Final   Special Requests NONE  Final   Sputum evaluation   Final    THIS SPECIMEN IS ACCEPTABLE. RESPIRATORY CULTURE REPORT TO FOLLOW.   Report Status 06/26/2016 FINAL  Final  Culture, respiratory (NON-Expectorated)     Status: None   Collection Time: 06/26/16 12:37 PM  Result Value Ref Range Status   Specimen Description SPUTUM  Final   Special Requests NONE  Final   Gram Stain   Final    ABUNDANT WBC PRESENT, PREDOMINANTLY PMN MODERATE GRAM POSITIVE COCCI IN PAIRS IN CLUSTERS FEW GRAM NEGATIVE RODS RARE GRAM POSITIVE RODS RARE GRAM VARIABLE ROD FEW GRAM NEGATIVE COCCOBACILLI    Culture   Final    Consistent with normal respiratory flora. Performed at [facility]    Report Status 06/28/2016 FINAL  Final         Radiology Studies: No results found.      Scheduled Meds: . allopurinol  300 mg Oral Daily  . atorvastatin  20 mg Oral Daily  . azithromycin  500 mg Oral Daily  . cefTRIAXone (ROCEPHIN)  IV  1 g Intravenous Q24H  . dextromethorphan-guaiFENesin  1 tablet Oral BID  . enoxaparin (LOVENOX) injection  40 mg Subcutaneous Q24H  . ferrous sulfate  325 mg Oral Q breakfast  . fluticasone  1 spray Each Nare Daily  . lisinopril  20 mg Oral Daily   And  . hydrochlorothiazide  25 mg Oral Daily  . Influenza vac split quadrivalent PF  0.5 mL Intramuscular Tomorrow-1000  . insulin aspart  0-20 Units Subcutaneous TID WC  . insulin aspart  0-5 Units Subcutaneous QHS  . insulin aspart  3 Units Subcutaneous TID WC  . insulin aspart protamine- aspart  30 Units Subcutaneous TID WC  .  insulin glargine  20 Units Subcutaneous QHS  . ipratropium-albuterol  3 mL Nebulization Q6H  . multivitamin with minerals  1 tablet Oral Daily  . pantoprazole  40 mg Oral Daily  . pneumococcal 23 valent vaccine  0.5 mL  Intramuscular Tomorrow-1000  . [START ON 06/29/2016] predniSONE  60 mg Oral QAC breakfast  . pregabalin  200 mg Oral BID  . sodium chloride flush  3 mL Intravenous Q12H   Continuous Infusions:   LOS: 2 days    Time spent: 30 minutes.     Hosie Poisson, MD [facility] Pager [phone]  If 7PM-7AM, please contact night-coverage [email or web address] Password [facility] 06/28/2016, 6:25 PM

## 2016-06-28 NOTE — Progress Notes (Signed)
Patient ambulated in hallway on room air.  Sats 86-90%, patient short of breath upon returning to room.  Weaned to 1L [place].  Reported to physician.

## 2016-06-28 NOTE — Progress Notes (Signed)
PROGRESS NOTE    KERI DUEL  [id number] DOB: 05-29-1966 DOA: 06/25/2016 PCP: Harvie Junior, MD   Brief Narrative: Gina [name] is an 50 y.o. female with a PMH of asthma, HTN, HLD, DM, IBS, fibromyalgia whopresents To the ED complaining of a 2 day history of worsening shortness of breath associated with a mostly nonproductive cough, but did report occasional green-gray sputum and significant wheezing. CXR shows interstitial pneumonia. She was started on IV antibiotics and IV steroids.   Assessment & Plan:   Principal Problem:   Interstitial pneumonia ([place]) Active Problems:   Shortness of breath   UTI (urinary tract infection)   Hyperlipidemia   Diabetes mellitus ([place])   Hypertension   Asthma exacerbation   Polypharmacy  Acute respiratory failure with hypoxia from  Atypical interstitial pneumonia and acute asthma exacerbation.  Currently requiring 2 lit of [place] OXYGEN. And she was started on IV rocephin and IV zithromax along with IV steroids.  Breathing improved,but she has persistent wheezing.  Sputum cultures ordered and pending.    UTI:  Cultures are pending, grew 1,00,000  Ecoli.  Resume IV antibiotics.    diabetes mellitus: CBG (last 3)   Recent Labs  06/28/16 0742 06/28/16 1239 06/28/16 1708  GLUCAP 195* 256* 156*    hgba1c 6.3  Added  premeal coverage and increase SSI to resistant scale.   Hypertensin  Well controlled.       DVT prophylaxis: (Lovenox) Code Status: full code.  Family Communication: none at bedside.  Disposition Plan: 1 to 2 days.   Consultants:  None   Procedures: none    Antimicrobials:rocephin and ZITHROMAX on 11/16   Subjective: No new complaints.   Objective: Vitals:   06/28/16 1047 06/28/16 1326 06/28/16 1513 06/28/16 1630  BP:  (!) 157/74    Pulse:  78    Resp:  16    Temp:  97.4 F (36.3 C)    TempSrc:  Oral    SpO2: 96% 92% 94% 98%  Weight:      Height:        Intake/Output Summary  (Last 24 hours) at 06/28/16 1822 Last data filed at 06/28/16 0532  Gross per 24 hour  Intake              463 ml  Output                0 ml  Net              463 ml   Filed Weights   06/25/16 1528  Weight: (!) 145 kg (319 lb 10.7 oz)    Examination:  General exam: Appears calm and comfortable  On 2lit of  oxygen.  Respiratory system:  Air entry poor, scattered wheezing, increased work of breathing Cardiovascular system: S1 & S2 heard, RRR. No JVD, murmurs, rubs, gallops or clicks. No pedal edema. Gastrointestinal system: Abdomen is nondistended, soft and nontender. No organomegaly or masses felt. Normal bowel sounds heard. Central nervous system: Alert and oriented. No focal neurological deficits. Extremities: Symmetric 5 x 5 power. Skin: No rashes, lesions or ulcers Psychiatry: Judgement and insight appear normal. Mood & affect appropriate.     Data Reviewed: I have personally reviewed following labs and imaging studies  CBC:  Recent Labs Lab 06/25/16 1137  WBC 4.8  NEUTROABS 2.8  HGB 12.6  HCT 39.8  MCV 90.9  PLT 123XX123   Basic Metabolic Panel:  Recent Labs Lab 06/25/16 1137  NA 143  K 4.1  CL 106  CO2 28  GLUCOSE 69  BUN 15  CREATININE 0.77  CALCIUM 9.5   GFR: Estimated Creatinine Clearance: 116.1 mL/min (by C-G formula based on SCr of 0.77 mg/dL). Liver Function Tests: No results for input(s): AST, ALT, ALKPHOS, BILITOT, PROT, ALBUMIN in the last 168 hours. No results for input(s): LIPASE, AMYLASE in the last 168 hours. No results for input(s): AMMONIA in the last 168 hours. Coagulation Profile: No results for input(s): INR, PROTIME in the last 168 hours. Cardiac Enzymes: No results for input(s): CKTOTAL, CKMB, CKMBINDEX, TROPONINI in the last 168 hours. BNP (last 3 results) No results for input(s): PROBNP in the last 8760 hours. HbA1C:  Recent Labs  06/27/16 0518  HGBA1C 6.3*   CBG:  Recent Labs Lab 06/27/16 1258 06/27/16 1725  06/28/16 0742 06/28/16 1239 06/28/16 1708  GLUCAP 244* 175* 195* 256* 156*   Lipid Profile: No results for input(s): CHOL, HDL, LDLCALC, TRIG, CHOLHDL, LDLDIRECT in the last 72 hours. Thyroid Function Tests: No results for input(s): TSH, T4TOTAL, FREET4, T3FREE, THYROIDAB in the last 72 hours. Anemia Panel: No results for input(s): VITAMINB12, FOLATE, FERRITIN, TIBC, IRON, RETICCTPCT in the last 72 hours. Sepsis Labs: No results for input(s): PROCALCITON, LATICACIDVEN in the last 168 hours.  Recent Results (from the past 240 hour(s))  Urine culture     Status: Abnormal   Collection Time: 06/25/16 12:01 PM  Result Value Ref Range Status   Specimen Description URINE, CLEAN CATCH  Final   Special Requests NONE  Final   Culture >=100,000 COLONIES/mL ESCHERICHIA COLI (A)  Final   Report Status 06/27/2016 FINAL  Final   Organism ID, Bacteria ESCHERICHIA COLI (A)  Final      Susceptibility   Escherichia coli - MIC*    AMPICILLIN 4 SENSITIVE Sensitive     CEFAZOLIN <=4 SENSITIVE Sensitive     CEFTRIAXONE <=1 SENSITIVE Sensitive     CIPROFLOXACIN <=0.25 SENSITIVE Sensitive     GENTAMICIN <=1 SENSITIVE Sensitive     IMIPENEM <=0.25 SENSITIVE Sensitive     NITROFURANTOIN <=16 SENSITIVE Sensitive     TRIMETH/SULFA <=20 SENSITIVE Sensitive     AMPICILLIN/SULBACTAM <=2 SENSITIVE Sensitive     PIP/TAZO <=4 SENSITIVE Sensitive     Extended ESBL NEGATIVE Sensitive     * >=100,000 COLONIES/mL ESCHERICHIA COLI  Culture, blood (routine x 2) Call MD if unable to obtain prior to antibiotics being given     Status: None (Preliminary result)   Collection Time: 06/25/16  3:26 PM  Result Value Ref Range Status   Specimen Description BLOOD LEFT HAND  Final   Special Requests BOTTLES DRAWN AEROBIC AND ANAEROBIC 10CC  Final   Culture   Final    NO GROWTH 3 DAYS Performed at [facility]    Report Status PENDING  Incomplete  Culture, blood (routine x 2) Call MD if unable to obtain prior to  antibiotics being given     Status: None (Preliminary result)   Collection Time: 06/25/16  3:26 PM  Result Value Ref Range Status   Specimen Description BLOOD RIGHT HAND  Final   Special Requests BOTTLES DRAWN AEROBIC AND ANAEROBIC [place]  Final   Culture   Final    NO GROWTH 3 DAYS Performed at [facility]    Report Status PENDING  Incomplete  Respiratory Panel by PCR     Status: None   Collection Time: 06/25/16  4:00 PM  Result Value Ref Range Status  Adenovirus NOT DETECTED NOT DETECTED Final   Coronavirus 229E NOT DETECTED NOT DETECTED Final   Coronavirus HKU1 NOT DETECTED NOT DETECTED Final   Coronavirus NL63 NOT DETECTED NOT DETECTED Final   Coronavirus OC43 NOT DETECTED NOT DETECTED Final   Metapneumovirus NOT DETECTED NOT DETECTED Final   Rhinovirus / Enterovirus NOT DETECTED NOT DETECTED Final   Influenza A NOT DETECTED NOT DETECTED Final   Influenza B NOT DETECTED NOT DETECTED Final   Parainfluenza Virus 1 NOT DETECTED NOT DETECTED Final   Parainfluenza Virus 2 NOT DETECTED NOT DETECTED Final   Parainfluenza Virus 3 NOT DETECTED NOT DETECTED Final   Parainfluenza Virus 4 NOT DETECTED NOT DETECTED Final   Respiratory Syncytial Virus NOT DETECTED NOT DETECTED Final   Bordetella pertussis NOT DETECTED NOT DETECTED Final   Chlamydophila pneumoniae NOT DETECTED NOT DETECTED Final   Mycoplasma pneumoniae NOT DETECTED NOT DETECTED Final    Comment: Performed at [facility]  MRSA PCR Screening     Status: None   Collection Time: 06/25/16  4:02 PM  Result Value Ref Range Status   MRSA by PCR NEGATIVE NEGATIVE Final    Comment:        The GeneXpert MRSA Assay (FDA approved for NASAL specimens only), is one component of a comprehensive MRSA colonization surveillance program. It is not intended to diagnose MRSA infection nor to guide or monitor treatment for MRSA infections.   Culture, sputum-assessment     Status: None   Collection Time: 06/26/16 12:37 PM    Result Value Ref Range Status   Specimen Description SPUTUM  Final   Special Requests NONE  Final   Sputum evaluation   Final    THIS SPECIMEN IS ACCEPTABLE. RESPIRATORY CULTURE REPORT TO FOLLOW.   Report Status 06/26/2016 FINAL  Final  Culture, respiratory (NON-Expectorated)     Status: None   Collection Time: 06/26/16 12:37 PM  Result Value Ref Range Status   Specimen Description SPUTUM  Final   Special Requests NONE  Final   Gram Stain   Final    ABUNDANT WBC PRESENT, PREDOMINANTLY PMN MODERATE GRAM POSITIVE COCCI IN PAIRS IN CLUSTERS FEW GRAM NEGATIVE RODS RARE GRAM POSITIVE RODS RARE GRAM VARIABLE ROD FEW GRAM NEGATIVE COCCOBACILLI    Culture   Final    Consistent with normal respiratory flora. Performed at [facility]    Report Status 06/28/2016 FINAL  Final         Radiology Studies: No results found.      Scheduled Meds: . allopurinol  300 mg Oral Daily  . atorvastatin  20 mg Oral Daily  . azithromycin  500 mg Oral Daily  . cefTRIAXone (ROCEPHIN)  IV  1 g Intravenous Q24H  . dextromethorphan-guaiFENesin  1 tablet Oral BID  . enoxaparin (LOVENOX) injection  40 mg Subcutaneous Q24H  . ferrous sulfate  325 mg Oral Q breakfast  . fluticasone  1 spray Each Nare Daily  . lisinopril  20 mg Oral Daily   And  . hydrochlorothiazide  25 mg Oral Daily  . Influenza vac split quadrivalent PF  0.5 mL Intramuscular Tomorrow-1000  . insulin aspart  0-20 Units Subcutaneous TID WC  . insulin aspart  0-5 Units Subcutaneous QHS  . insulin aspart  3 Units Subcutaneous TID WC  . insulin aspart protamine- aspart  30 Units Subcutaneous TID WC  . insulin glargine  20 Units Subcutaneous QHS  . ipratropium-albuterol  3 mL Nebulization Q6H  . multivitamin with minerals  1 tablet Oral Daily  . pantoprazole  40 mg Oral Daily  . pneumococcal 23 valent vaccine  0.5 mL Intramuscular Tomorrow-1000  . [START ON 06/29/2016] predniSONE  60 mg Oral QAC breakfast  . pregabalin   200 mg Oral BID  . sodium chloride flush  3 mL Intravenous Q12H   Continuous Infusions:   LOS: 2 days    Time spent: 30 minutes.     Hosie Poisson, MD [facility] Pager [phone]  If 7PM-7AM, please contact night-coverage [email or web address] Password [facility] 06/28/2016, 6:22 PM

## 2016-06-29 LAB — GLUCOSE, CAPILLARY
GLUCOSE-CAPILLARY: 166 mg/dL — AB (ref 65–99)
Glucose-Capillary: 316 mg/dL — ABNORMAL HIGH (ref 65–99)
Glucose-Capillary: 70 mg/dL (ref 65–99)

## 2016-06-29 MED ORDER — LEVOFLOXACIN 500 MG PO TABS: 500.0000 mg | ORAL_TABLET | Freq: Every day | ORAL | 0 refills | Status: DC

## 2016-06-29 MED ORDER — DM-GUAIFENESIN ER 30-600 MG PO TB12: 1.0000 | ORAL_TABLET | Freq: Two times a day (BID) | ORAL | 0 refills | Status: DC

## 2016-06-29 MED ORDER — PROMETHAZINE HCL 25 MG PO TABS: 25.0000 mg | ORAL_TABLET | Freq: Three times a day (TID) | ORAL | 0 refills | Status: DC | PRN

## 2016-06-29 MED ORDER — IPRATROPIUM-ALBUTEROL 0.5-2.5 (3) MG/3ML IN SOLN: 3.0000 mL | Freq: Four times a day (QID) | RESPIRATORY_TRACT | 2 refills | Status: DC | PRN

## 2016-06-29 MED ORDER — PREDNISONE 20 MG PO TABS: ORAL_TABLET | ORAL | 0 refills | Status: DC

## 2016-06-29 NOTE — Progress Notes (Signed)
Date: June 29, 2016 Discharge orders checked for needs. o2 at [employer] via [place] ordered from [facility] via telephone call at [address] [place], [place], [place]   [phone]

## 2016-06-29 NOTE — Progress Notes (Signed)
Inpatient Diabetes Program Recommendations  AACE/ADA: New Consensus Statement on Inpatient Glycemic Control (2015)  Target Ranges:  Prepandial:   less than 140 mg/dL      Peak postprandial:   less than 180 mg/dL (1-2 hours)      Critically ill patients:  140 - 180 mg/dL   Results for KHARISMA, FIERMAN (MRN [id number]) as of 06/29/2016 09:19  Ref. Range 06/28/2016 07:42 06/28/2016 12:39 06/28/2016 17:08 06/28/2016 19:53  Glucose-Capillary Latest Ref Range: 65 - 99 mg/dL 195 (H) 256 (H) 156 (H) 139 (H)   Results for KEELIN, ZITTEL (MRN [id number]) as of 06/29/2016 09:19  Ref. Range 06/29/2016 07:44  Glucose-Capillary Latest Ref Range: 65 - 99 mg/dL 70    Home DM Meds: Lantus 20 units QHS       70/30 Insulin- 30 units TIDWC       Metformin 500 mg BID  Current Insulin Orders: Lantus 20 units QHS                 70/30 Insulin- 30 units TIDWC                 Novolog Resistant Correction Scale/ SSI (0-20 units) TID AC + HS                 Novolog 3 units TIDWC       MD- Novolog Meal Coverage not recommended to be given simultaneously with 70/30 Insulin.  Novolog 70/30 Mix Insulin has a built-in dose of Novolog (meal coverage) within it.  Novolog Correction scale/ SSI is OK to give with 70/30 Insulin.  Note patient had borderline low CBG this AM (70 mg/dl).  Note last dose Solumedrol given yesterday at 5am and now patient getting Prednisone 60 mg daily.  Please consider the following in-hospital insulin adjustments:  1. Reduce Lantus to 17 units QHS  2. Discontinue Novolog 3 units TIDWC     --Will follow patient during hospitalization--  Wyn Quaker RN, MSN, CDE Diabetes Coordinator Inpatient Glycemic Control Team Team Pager: [phone] (8a-5p)

## 2016-06-29 NOTE — Progress Notes (Signed)
Patient verbalized understanding of discharge paperwork. Paper prescriptions given to patient along with discharge AVS. IV removed. Belongings gathered. Husband to room to pick up patient. Taken down to main entrance via wheelchair by NT.

## 2016-06-29 NOTE — Progress Notes (Signed)
RT placed PT on RA post neb tx- Spo2 pre neb tx on 1 LPM [place]= 98% (no usage at home). RT asked RN Tech to check PT spo2 on RA- RN Tech agreed.

## 2016-06-29 NOTE — Discharge Summary (Signed)
Physician Discharge Summary  Gina [name] [id number] DOB: 04/09/1966 DOA: 06/25/2016  PCP: Gina Junior, MD  Admit date: 06/25/2016 Discharge date: 06/29/2016  Admitted From: Home.  Disposition:  Home.   Recommendations for Outpatient Follow-up:  1. Follow up with PCP in 1-2 weeks 2. Please obtain BMP/CBC in one week 3. Please follow up with pulmonology in 1 to 2 weeks.  4. Please get a gastric emptying study as outpatient    Equipment/Devices:1 lit of oxygen.  Discharge Condition:stable.  CODE STATUS:full code.  Diet recommendation: Heart Healthy / Carb Modified   Brief/Interim Summary: Gina Lozoya Hicksis an 50 y.o.femalewith a PMH of asthma, HTN, HLD, DM, IBS, fibromyalgia whopresents To the ED complaining of a 2 day history of worsening shortness of breath associated with a mostly nonproductive cough, but did report occasional green-gray sputum and significant wheezing. CXR shows interstitial pneumonia. She was started on IV antibiotics and IV steroids.   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  Currently requiring 1 lit of [name] OXYGEN. And she was started on IV rocephin and IV zithromax along with IV steroids.  Breathing improved, wheezing has improved.  Tapered the IV steroids to po prednisone.  Sputum cultures ordered , showed normal oro pharyngeal flora.  She will be discharged on tapering prednisone and oral antibiotic to complete the course.  Recommend outpatient follow up with pulmonology. She appears to have a component of obesity hypoventilation syndrome.    Nausea: no vomiting or abdominal pain> get outpatient gastric emptying study for  further evaluation.    E coli UTI - resume levaquin to complete the course.    Diabetes mellitus:  CBG (last 3)   Recent Labs (last 2 labs)    Recent Labs  06/28/16 0742 06/28/16 1239 06/28/16 1708  GLUCAP 195* 256* 156*      hgba1c 6.3  Resume home medications.   Hypertensin  Well controlled.   Discharge Instructions  Discharge Instructions    Diet - low sodium heart healthy    Complete by:  As directed    Diet Carb Modified    Complete by:  As directed    Discharge instructions    Complete by:  As directed    You will need a referral to a pulmonologist for further evaluation.  Please follow up with PCP ino ne week. Please see a pulmonologist in one to two weeks. You will need a gastric emptying study to be done as outpatient.       Medication List    STOP taking these medications   BC HEADACHE POWDER PO   ibuprofen 200 MG tablet Commonly known as:  ADVIL,MOTRIN     TAKE these medications   albuterol (2.5 MG/3ML) 0.083% nebulizer solution Commonly known as:  PROVENTIL Take 2.5 mg by nebulization every 6 (six) hours as needed for wheezing or shortness of breath.   albuterol 108 (90 Base) MCG/ACT inhaler Commonly known as:  PROVENTIL HFA;VENTOLIN HFA Inhale 2 puffs into the lungs every 6 (six) hours as needed for wheezing or shortness of breath.   allopurinol 300 MG tablet Commonly known as:  ZYLOPRIM Take 300 mg by mouth daily.   ALPRAZolam 1 MG tablet Commonly known  as:  XANAX Take 1 mg by mouth 2 (two) times daily as needed for anxiety.   atorvastatin 20 MG tablet Commonly known as:  LIPITOR Take 20 mg by mouth daily.   butalbital-acetaminophen-caffeine 50-325-40 MG tablet Commonly known as:  FIORICET, ESGIC Take 1 tablet by mouth daily as needed for headache.   cyclobenzaprine 10 MG tablet Commonly known as:  FLEXERIL Take 10 mg by mouth 3 (three) times daily as needed for muscle spasms.   dextromethorphan-guaiFENesin 30-600  MG 12hr tablet Commonly known as:  MUCINEX DM Take 1 tablet by mouth 2 (two) times daily.   ferrous sulfate 325 (65 FE) MG tablet Take 325 mg by mouth daily with breakfast.   fluticasone 50 MCG/ACT nasal spray Commonly known as:  FLONASE Place 1 spray into both nostrils daily.   insulin glargine 100 UNIT/ML injection Commonly known as:  LANTUS Inject 20 Units into the skin at bedtime.   ipratropium-albuterol 0.5-2.5 (3) MG/3ML Soln Commonly known as:  DUONEB Take 3 mLs by nebulization every 6 (six) hours as needed.   levofloxacin 500 MG tablet Commonly known as:  LEVAQUIN Take 1 tablet (500 mg total) by mouth daily. Start taking on:  06/30/2016   lisinopril-hydrochlorothiazide 20-25 MG tablet Commonly known as:  PRINZIDE,ZESTORETIC Take 1 tablet by mouth daily.   metFORMIN 500 MG tablet Commonly known as:  GLUCOPHAGE Take 500 mg by mouth 2 (two) times daily with a meal.   multivitamin with minerals Tabs tablet Take 1 tablet by mouth daily.   NOVOLOG MIX 70/30 (70-30) 100 UNIT/ML injection Generic drug:  insulin aspart protamine- aspart Inject 30 Units into the skin 3 (three) times daily with meals.   omeprazole 20 MG capsule Commonly known as:  PRILOSEC Take 20 mg by mouth daily with breakfast.   oxyCODONE-acetaminophen 10-325 MG tablet Commonly known as:  PERCOCET Take 1 tablet by mouth 3 (three) times daily.   predniSONE 20 MG tablet Commonly known as:  DELTASONE Prednisone 60 mg daily for 2 days followed by Prednisone 40 mg daily for 3 days followed by  Prednisone 20 mg daily for 3 days and stop.   pregabalin 200 MG capsule Commonly known as:  LYRICA Take 200 mg by mouth 2 (two) times daily.   promethazine 25 MG tablet Commonly known as:  PHENERGAN Take 1 tablet (25 mg total) by mouth every 8 (eight) hours as needed. What changed:  when to take this  reasons to take this      Follow-up Information    Please use the resources provided to you in  emergency room by case manager to assist you're your choice of doctor for follow up. Schedule an appointment as soon as possible for a visit.   WhyKathleen Argue [phone] [address] [place], [place] [address] [email or web address] Use this website to assist with finding a Medicaid doctor, understanding your coverage & to renew application Contact information: As a Medicaid client you MUST contact DSS/SSI each time you change address, move to another [place] or another state to keep your address updated  [place] Medicaid Transportation to Dr appts if you are have full Medicaid: [phone], [phone]...       Gina Junior, MD. Schedule an appointment as soon as possible for a visit in 1 week(s).   Specialty:  Family Medicine Contact information: [place] [address] [phone]        [facility]. Schedule an appointment as soon as possible for  a visit in 1 week(s).   Specialty:  Pulmonology Contact information: [place] [phone]         Allergies  Allergen Reactions  . Penicillins Hives and Swelling    Swelling all over body  Has patient had a PCN reaction causing immediate rash, facial/tongue/throat swelling, SOB or lightheadedness with hypotension: Yes Has patient had a PCN reaction causing severe rash involving mucus membranes or skin necrosis: Yes Has patient had a PCN reaction that required hospitalization Unknown Has patient had a PCN reaction occurring within the last 10 years: No  If all of the above answers are "NO", then may proceed with Cephalosporin use.   Carlton Adam [Propoxyphene N-Acetaminophen] Nausea And Vomiting  . Morphine And Related Itching    Consultations:  None.   Procedures/Studies: Dg Chest 2 View  Result Date: 06/25/2016 CLINICAL DATA:  Two days of shortness of breath, cough, and chest congestion. The patient has an asthma an uses an inhaler at  home. Former smoker. EXAM: CHEST  2 VIEW COMPARISON:  Chest x-ray of July 10, 2008 FINDINGS: The lungs are adequately inflated. The interstitial markings are increased bilaterally. There is no alveolar infiltrate. There is no pleural effusion. The heart and pulmonary vascularity are normal. The mediastinum is normal in width. The bony thorax exhibits no acute abnormality. There is mild multilevel degenerative disc disease of the thoracic spine. IMPRESSION: Chronic bronchitic changes. Increased interstitial markings over baseline may reflect acute bronchitis or early interstitial pneumonia. No CHF. Followup PA and lateral chest X-ray is recommended in 3-4 weeks following trial of antibiotic therapy to ensure resolution and exclude underlying malignancy. Electronically Signed   By: David  Martinique M.D.   On: 06/25/2016 12:10       Subjective: No new complaints.   Discharge Exam: Vitals:   06/28/16 2150 06/29/16 0519  BP: 124/62 138/71  Pulse: 68 70  Resp:  (!) 22  Temp:  97.5 F (36.4 C)   Vitals:   06/29/16 0105 06/29/16 0519 06/29/16 0800 06/29/16 0854  BP:  138/71    Pulse:  70    Resp:  (!) 22    Temp:  97.5 F (36.4 C)    TempSrc:  Oral    SpO2: 96% 98% 98% 90%  Weight:      Height:        General: Pt is alert, awake, not in acute distress Cardiovascular: RRR, S1/S2 +, no rubs, no gallops Respiratory: CTA bilaterally, no wheezing, no rhonchi Abdominal: Soft, NT, ND, bowel sounds + Extremities: no edema, no cyanosis    The results of significant diagnostics from this hospitalization (including imaging, microbiology, ancillary and laboratory) are listed below for reference.     Microbiology: Recent Results (from the past 240 hour(s))  Urine culture     Status: Abnormal   Collection Time: 06/25/16 12:01 PM  Result Value Ref Range Status   Specimen Description URINE, CLEAN CATCH  Final   Special Requests NONE  Final   Culture >=100,000 COLONIES/mL ESCHERICHIA COLI (A)   Final   Report Status 06/27/2016 FINAL  Final   Organism ID, Bacteria ESCHERICHIA COLI (A)  Final      Susceptibility   Escherichia coli - MIC*    AMPICILLIN 4 SENSITIVE Sensitive     CEFAZOLIN <=4 SENSITIVE Sensitive     CEFTRIAXONE <=1 SENSITIVE Sensitive     CIPROFLOXACIN <=0.25 SENSITIVE Sensitive     GENTAMICIN <=1 SENSITIVE Sensitive  IMIPENEM <=0.25 SENSITIVE Sensitive     NITROFURANTOIN <=16 SENSITIVE Sensitive     TRIMETH/SULFA <=20 SENSITIVE Sensitive     AMPICILLIN/SULBACTAM <=2 SENSITIVE Sensitive     PIP/TAZO <=4 SENSITIVE Sensitive     Extended ESBL NEGATIVE Sensitive     * >=100,000 COLONIES/mL ESCHERICHIA COLI  Culture, blood (routine x 2) Call MD if unable to obtain prior to antibiotics being given     Status: None (Preliminary result)   Collection Time: 06/25/16  3:26 PM  Result Value Ref Range Status   Specimen Description BLOOD LEFT HAND  Final   Special Requests BOTTLES DRAWN AEROBIC AND ANAEROBIC 10CC  Final   Culture   Final    NO GROWTH 4 DAYS Performed at [facility]    Report Status PENDING  Incomplete  Culture, blood (routine x 2) Call MD if unable to obtain prior to antibiotics being given     Status: None (Preliminary result)   Collection Time: 06/25/16  3:26 PM  Result Value Ref Range Status   Specimen Description BLOOD RIGHT HAND  Final   Special Requests BOTTLES DRAWN AEROBIC AND ANAEROBIC [place]  Final   Culture   Final    NO GROWTH 4 DAYS Performed at [facility]    Report Status PENDING  Incomplete  Respiratory Panel by PCR     Status: None   Collection Time: 06/25/16  4:00 PM  Result Value Ref Range Status   Adenovirus NOT DETECTED NOT DETECTED Final   Coronavirus 229E NOT DETECTED NOT DETECTED Final   Coronavirus HKU1 NOT DETECTED NOT DETECTED Final   Coronavirus NL63 NOT DETECTED NOT DETECTED Final   Coronavirus OC43 NOT DETECTED NOT DETECTED Final   Metapneumovirus NOT DETECTED NOT DETECTED Final   Rhinovirus /  Enterovirus NOT DETECTED NOT DETECTED Final   Influenza A NOT DETECTED NOT DETECTED Final   Influenza B NOT DETECTED NOT DETECTED Final   Parainfluenza Virus 1 NOT DETECTED NOT DETECTED Final   Parainfluenza Virus 2 NOT DETECTED NOT DETECTED Final   Parainfluenza Virus 3 NOT DETECTED NOT DETECTED Final   Parainfluenza Virus 4 NOT DETECTED NOT DETECTED Final   Respiratory Syncytial Virus NOT DETECTED NOT DETECTED Final   Bordetella pertussis NOT DETECTED NOT DETECTED Final   Chlamydophila pneumoniae NOT DETECTED NOT DETECTED Final   Mycoplasma pneumoniae NOT DETECTED NOT DETECTED Final    Comment: Performed at [facility]  MRSA PCR Screening     Status: None   Collection Time: 06/25/16  4:02 PM  Result Value Ref Range Status   MRSA by PCR NEGATIVE NEGATIVE Final    Comment:        The GeneXpert MRSA Assay (FDA approved for NASAL specimens only), is one component of a comprehensive MRSA colonization surveillance program. It is not intended to diagnose MRSA infection nor to guide or monitor treatment for MRSA infections.   Culture, sputum-assessment     Status: None   Collection Time: 06/26/16 12:37 PM  Result Value Ref Range Status   Specimen Description SPUTUM  Final   Special Requests NONE  Final   Sputum evaluation   Final    THIS SPECIMEN IS ACCEPTABLE. RESPIRATORY CULTURE REPORT TO FOLLOW.   Report Status 06/26/2016 FINAL  Final  Culture, respiratory (NON-Expectorated)     Status: None   Collection Time: 06/26/16 12:37 PM  Result Value Ref Range Status   Specimen Description SPUTUM  Final   Special Requests NONE  Final  Gram Stain   Final    ABUNDANT WBC PRESENT, PREDOMINANTLY PMN MODERATE GRAM POSITIVE COCCI IN PAIRS IN CLUSTERS FEW GRAM NEGATIVE RODS RARE GRAM POSITIVE RODS RARE GRAM VARIABLE ROD FEW GRAM NEGATIVE COCCOBACILLI    Culture   Final    Consistent with normal respiratory flora. Performed at [facility]    Report Status 06/28/2016  FINAL  Final     Labs: BNP (last 3 results) No results for input(s): BNP in the last 8760 hours. Basic Metabolic Panel:  Recent Labs Lab 06/25/16 1137  NA 143  K 4.1  CL 106  CO2 28  GLUCOSE 69  BUN 15  CREATININE 0.77  CALCIUM 9.5   Liver Function Tests: No results for input(s): AST, ALT, ALKPHOS, BILITOT, PROT, ALBUMIN in the last 168 hours. No results for input(s): LIPASE, AMYLASE in the last 168 hours. No results for input(s): AMMONIA in the last 168 hours. CBC:  Recent Labs Lab 06/25/16 1137  WBC 4.8  NEUTROABS 2.8  HGB 12.6  HCT 39.8  MCV 90.9  PLT 177   Cardiac Enzymes: No results for input(s): CKTOTAL, CKMB, CKMBINDEX, TROPONINI in the last 168 hours. BNP: Invalid input(s): POCBNP CBG:  Recent Labs Lab 06/28/16 1239 06/28/16 1708 06/28/16 1953 06/29/16 0744 06/29/16 1207  GLUCAP 256* 156* 139* 70 166*   D-Dimer No results for input(s): DDIMER in the last 72 hours. Hgb A1c  Recent Labs  06/27/16 0518  HGBA1C 6.3*   Lipid Profile No results for input(s): CHOL, HDL, LDLCALC, TRIG, CHOLHDL, LDLDIRECT in the last 72 hours. Thyroid function studies No results for input(s): TSH, T4TOTAL, T3FREE, THYROIDAB in the last 72 hours.  Invalid input(s): FREET3 Anemia work up No results for input(s): VITAMINB12, FOLATE, FERRITIN, TIBC, IRON, RETICCTPCT in the last 72 hours. Urinalysis    Component Value Date/Time   COLORURINE YELLOW 06/25/2016 1201   APPEARANCEUR CLOUDY (A) 06/25/2016 1201   LABSPEC 1.010 06/25/2016 1201   PHURINE 6.0 06/25/2016 1201   GLUCOSEU NEGATIVE 06/25/2016 1201   HGBUR MODERATE (A) 06/25/2016 1201   BILIRUBINUR NEGATIVE 06/25/2016 1201   KETONESUR NEGATIVE 06/25/2016 1201   PROTEINUR 100 (A) 06/25/2016 1201   UROBILINOGEN 1.0 01/13/2012 1703   NITRITE POSITIVE (A) 06/25/2016 1201   LEUKOCYTESUR MODERATE (A) 06/25/2016 1201   Sepsis Labs Invalid input(s): PROCALCITONIN,  WBC,  LACTICIDVEN Microbiology Recent  Results (from the past 240 hour(s))  Urine culture     Status: Abnormal   Collection Time: 06/25/16 12:01 PM  Result Value Ref Range Status   Specimen Description URINE, CLEAN CATCH  Final   Special Requests NONE  Final   Culture >=100,000 COLONIES/mL ESCHERICHIA COLI (A)  Final   Report Status 06/27/2016 FINAL  Final   Organism ID, Bacteria ESCHERICHIA COLI (A)  Final      Susceptibility   Escherichia coli - MIC*    AMPICILLIN 4 SENSITIVE Sensitive     CEFAZOLIN <=4 SENSITIVE Sensitive     CEFTRIAXONE <=1 SENSITIVE Sensitive     CIPROFLOXACIN <=0.25 SENSITIVE Sensitive     GENTAMICIN <=1 SENSITIVE Sensitive     IMIPENEM <=0.25 SENSITIVE Sensitive     NITROFURANTOIN <=16 SENSITIVE Sensitive     TRIMETH/SULFA <=20 SENSITIVE Sensitive     AMPICILLIN/SULBACTAM <=2 SENSITIVE Sensitive     PIP/TAZO <=4 SENSITIVE Sensitive     Extended ESBL NEGATIVE Sensitive     * >=100,000 COLONIES/mL ESCHERICHIA COLI  Culture, blood (routine x 2) Call MD if unable to obtain prior  to antibiotics being given     Status: None (Preliminary result)   Collection Time: 06/25/16  3:26 PM  Result Value Ref Range Status   Specimen Description BLOOD LEFT HAND  Final   Special Requests BOTTLES DRAWN AEROBIC AND ANAEROBIC 10CC  Final   Culture   Final    NO GROWTH 4 DAYS Performed at [facility]    Report Status PENDING  Incomplete  Culture, blood (routine x 2) Call MD if unable to obtain prior to antibiotics being given     Status: None (Preliminary result)   Collection Time: 06/25/16  3:26 PM  Result Value Ref Range Status   Specimen Description BLOOD RIGHT HAND  Final   Special Requests BOTTLES DRAWN AEROBIC AND ANAEROBIC [place]  Final   Culture   Final    NO GROWTH 4 DAYS Performed at [facility]    Report Status PENDING  Incomplete  Respiratory Panel by PCR     Status: None   Collection Time: 06/25/16  4:00 PM  Result Value Ref Range Status   Adenovirus NOT DETECTED NOT DETECTED Final    Coronavirus 229E NOT DETECTED NOT DETECTED Final   Coronavirus HKU1 NOT DETECTED NOT DETECTED Final   Coronavirus NL63 NOT DETECTED NOT DETECTED Final   Coronavirus OC43 NOT DETECTED NOT DETECTED Final   Metapneumovirus NOT DETECTED NOT DETECTED Final   Rhinovirus / Enterovirus NOT DETECTED NOT DETECTED Final   Influenza A NOT DETECTED NOT DETECTED Final   Influenza B NOT DETECTED NOT DETECTED Final   Parainfluenza Virus 1 NOT DETECTED NOT DETECTED Final   Parainfluenza Virus 2 NOT DETECTED NOT DETECTED Final   Parainfluenza Virus 3 NOT DETECTED NOT DETECTED Final   Parainfluenza Virus 4 NOT DETECTED NOT DETECTED Final   Respiratory Syncytial Virus NOT DETECTED NOT DETECTED Final   Bordetella pertussis NOT DETECTED NOT DETECTED Final   Chlamydophila pneumoniae NOT DETECTED NOT DETECTED Final   Mycoplasma pneumoniae NOT DETECTED NOT DETECTED Final    Comment: Performed at [facility]  MRSA PCR Screening     Status: None   Collection Time: 06/25/16  4:02 PM  Result Value Ref Range Status   MRSA by PCR NEGATIVE NEGATIVE Final    Comment:        The GeneXpert MRSA Assay (FDA approved for NASAL specimens only), is one component of a comprehensive MRSA colonization surveillance program. It is not intended to diagnose MRSA infection nor to guide or monitor treatment for MRSA infections.   Culture, sputum-assessment     Status: None   Collection Time: 06/26/16 12:37 PM  Result Value Ref Range Status   Specimen Description SPUTUM  Final   Special Requests NONE  Final   Sputum evaluation   Final    THIS SPECIMEN IS ACCEPTABLE. RESPIRATORY CULTURE REPORT TO FOLLOW.   Report Status 06/26/2016 FINAL  Final  Culture, respiratory (NON-Expectorated)     Status: None   Collection Time: 06/26/16 12:37 PM  Result Value Ref Range Status   Specimen Description SPUTUM  Final   Special Requests NONE  Final   Gram Stain   Final    ABUNDANT WBC PRESENT, PREDOMINANTLY PMN MODERATE  GRAM POSITIVE COCCI IN PAIRS IN CLUSTERS FEW GRAM NEGATIVE RODS RARE GRAM POSITIVE RODS RARE GRAM VARIABLE ROD FEW GRAM NEGATIVE COCCOBACILLI    Culture   Final    Consistent with normal respiratory flora. Performed at [facility]    Report Status 06/28/2016 FINAL  Final     Time coordinating discharge: Over 30 minutes  SIGNED:   Hosie Poisson, MD  [facility] 06/29/2016, 12:48 PM Pager   If 7PM-7AM, please contact night-coverage [email or web address] Password [email or web address]

## 2016-06-29 NOTE — Progress Notes (Signed)
SATURATION QUALIFICATIONS: (This note is used to comply with regulatory documentation for home oxygen)  Patient Saturations on Room Air at Rest = 90%  Patient Saturations on Room Air while Ambulating = 88%  Patient Saturations on 1 Liters of oxygen while Ambulating = 94%  Please briefly explain why patient needs home oxygen: Decreased oxygen saturation while ambulating.

## 2016-06-30 LAB — CULTURE, BLOOD (ROUTINE X 2)
CULTURE: NO GROWTH
Culture: NO GROWTH

## 2016-07-06 ENCOUNTER — Encounter: Payer: Self-pay | Admitting: Gastroenterology

## 2016-07-14 ENCOUNTER — Encounter: Payer: Self-pay | Admitting: Gastroenterology

## 2016-07-14 ENCOUNTER — Ambulatory Visit (INDEPENDENT_AMBULATORY_CARE_PROVIDER_SITE_OTHER): Payer: Medicare Other | Admitting: Gastroenterology

## 2016-07-14 VITALS — BP 130/68 | HR 90 | Ht 64.0 in | Wt 313.0 lb

## 2016-07-14 DIAGNOSIS — R110 Nausea: Secondary | ICD-10-CM

## 2016-07-14 DIAGNOSIS — R1031 Right lower quadrant pain: Secondary | ICD-10-CM

## 2016-07-14 DIAGNOSIS — K5901 Slow transit constipation: Secondary | ICD-10-CM

## 2016-07-14 DIAGNOSIS — K582 Mixed irritable bowel syndrome: Secondary | ICD-10-CM

## 2016-07-14 NOTE — Progress Notes (Signed)
[facility] Consult Note:  History: Gina [name] 07/14/2016  Referring physician: Pcp Not In System  Reason for consult/chief complaint: Constipation (pt reports intermittent constipation that causes her stomach to feel swollen; she also reports nausea with occasional vomiting)   Subjective  HPI:  This is the initial office consult for a 50 year old woman referred by primary care for a constellation of GI symptoms. She reports years of intermittent crampy abdominal pain and bloating where she has right-sided abdominal distention that seems to get better after she takes laxatives. [place] between constipation and diarrhea and so she carries a diagnosis of IBS. She also feels all this seemed to start after a hysterectomy, and she is concerned that her bowels might have become "twisted" as a result of that. She has fibromyalgia and chronic headaches, has lately been off Percocet but is still on Fioricet for headaches. She has a chronic anxiety disorder as well. She is been taking BC powders because she has not been able to get into the pain clinic. She also says she was worked up at [facility] for possible bariatric surgery in 2013, but they were concerned "because there was some tumor on my bowels". She then said she was supposed to be referred to [facility], "but that fell through". Lastly, she complains for years that when she has a bowel movement she will have a pain in the back of the head travels all the way down to her lower spine and then she can feel her heart beating. There is intermittent nausea but no vomiting or weight loss.   ROS:  Review of Systems  Constitutional: Negative for appetite change and unexpected weight change.  HENT: Negative for mouth sores and voice change.   Eyes: Negative for pain and redness.  Respiratory: Negative for cough and shortness of breath.   Cardiovascular: Negative for chest pain and palpitations.  Genitourinary: Negative for dysuria and  hematuria.  Musculoskeletal: Positive for arthralgias, back pain and myalgias.  Skin: Negative for pallor and rash.  Neurological: Negative for weakness and headaches.  Hematological: Negative for adenopathy.  Psychiatric/Behavioral: The patient is nervous/anxious.      Past Medical History: Past Medical History:  Diagnosis Date  . Arthritis   . Asthma   . Chronic pain   . Diabetes mellitus   . Fibromyalgia   . Hyperlipidemia   . Hypertension   . IBS (irritable bowel syndrome)   . Kidney stone   . Obesities, morbid ([place])   . Sinusitis      Past Surgical History: Past Surgical History:  Procedure Laterality Date  . ABDOMINAL HYSTERECTOMY    . CARPAL TUNNEL RELEASE    . CESAREAN SECTION    . CHOLECYSTECTOMY    . CYSTECTOMY    . LITHOTRIPSY       Family History: Family History  Problem Relation Age of Onset  . Cancer Mother   . Heart failure Father   . Diabetes Father   . Hypertension Father     Social History: Social History   Social History  . Marital status: Married    Spouse name: Jenny Reichmann  . Number of children: 3  . Years of education: 24   Occupational History  . Disabled    Social History Main Topics  . Smoking status: Current Every Day Smoker    Packs/day: 0.50    Types: Cigarettes  . Smokeless tobacco: Never Used  . Alcohol use No  . Drug use: No  . Sexual activity: Yes  Birth control/ protection: None   Other Topics Concern  . None   Social History Narrative   Smokes 1/2 ppd. Lives with husband and son.  Unemployed/disabled.  Independent of ADLs.    Allergies: Allergies  Allergen Reactions  . Penicillins Hives and Swelling    Swelling all over body  Has patient had a PCN reaction causing immediate rash, facial/tongue/throat swelling, SOB or lightheadedness with hypotension: Yes Has patient had a PCN reaction causing severe rash involving mucus membranes or skin necrosis: Yes Has patient had a PCN reaction that required  hospitalization Unknown Has patient had a PCN reaction occurring within the last 10 years: No  If all of the above answers are "NO", then may proceed with Cephalosporin use.   Carlton Adam [Propoxyphene N-Acetaminophen] Nausea And Vomiting  . Morphine And Related Itching    Outpatient Meds: Current Outpatient Prescriptions  Medication Sig Dispense Refill  . albuterol (PROVENTIL HFA;VENTOLIN HFA) 108 (90 BASE) MCG/ACT inhaler Inhale 2 puffs into the lungs every 6 (six) hours as needed for wheezing or shortness of breath.     Marland Kitchen albuterol (PROVENTIL) (2.5 MG/3ML) 0.083% nebulizer solution Take 2.5 mg by nebulization every 6 (six) hours as needed for wheezing or shortness of breath.    . allopurinol (ZYLOPRIM) 300 MG tablet Take 300 mg by mouth daily.    Marland Kitchen ALPRAZolam (XANAX) 1 MG tablet Take 1 mg by mouth 2 (two) times daily as needed for anxiety.     Marland Kitchen atorvastatin (LIPITOR) 20 MG tablet Take 20 mg by mouth daily.    . butalbital-acetaminophen-caffeine (FIORICET, ESGIC) 50-325-40 MG tablet Take 1 tablet by mouth daily as needed for headache.    . cyclobenzaprine (FLEXERIL) 10 MG tablet Take 10 mg by mouth 3 (three) times daily as needed for muscle spasms.    . ferrous sulfate 325 (65 FE) MG tablet Take 325 mg by mouth daily with breakfast.    . fluticasone (FLONASE) 50 MCG/ACT nasal spray Place 1 spray into both nostrils daily.    . insulin glargine (LANTUS) 100 UNIT/ML injection Inject 20 Units into the skin at bedtime.     Marland Kitchen ipratropium-albuterol (DUONEB) 0.5-2.5 (3) MG/3ML SOLN Take 3 mLs by nebulization every 6 (six) hours as needed. 360 mL 2  . levofloxacin (LEVAQUIN) 500 MG tablet Take 1 tablet (500 mg total) by mouth daily. 3 tablet 0  . lisinopril-hydrochlorothiazide (PRINZIDE,ZESTORETIC) 20-25 MG tablet Take 1 tablet by mouth daily.    . metFORMIN (GLUCOPHAGE) 500 MG tablet Take 500 mg by mouth 2 (two) times daily with a meal.    . Multiple Vitamin (MULTIVITAMIN WITH MINERALS) TABS  tablet Take 1 tablet by mouth daily.    Marland Kitchen NOVOLOG MIX 70/30 (70-30) 100 UNIT/ML injection Inject 30 Units into the skin 3 (three) times daily with meals.    Marland Kitchen omeprazole (PRILOSEC) 20 MG capsule Take 20 mg by mouth daily with breakfast.     . pregabalin (LYRICA) 200 MG capsule Take 200 mg by mouth 2 (two) times daily.    . promethazine (PHENERGAN) 25 MG tablet Take 1 tablet (25 mg total) by mouth every 8 (eight) hours as needed. 30 tablet 0   No current facility-administered medications for this visit.       ___________________________________________________________________ Objective   Exam:  BP 130/68   Pulse 90   Ht 5\' 4"  (1.626 m)   Wt (!) 313 lb (142 kg)   BMI 53.73 kg/m    General: this is a(n) Morbidly obese  middle-aged woman   Eyes: sclera anicteric, no redness  ENT: oral mucosa moist without lesions, no cervical or supraclavicular lymphadenopathy, good dentition  CV: RRR without murmur, S1/S2, no JVD, no peripheral edema  Resp: clear to auscultation bilaterally, normal RR and effort noted  GI: soft, scattered abdominal wall tenderness, typically at sites of insulin injection were bruising is evident. tenderness, with active bowel sounds. No guarding or palpable organomegaly noted. Exam limited by body habitus  Skin; warm and dry, no rash or jaundice noted  Neuro: awake, alert and oriented x 3. Normal gross motor function and fluent speech  Labs:   CBC Latest Ref Rng & Units 06/25/2016 04/23/2014 01/13/2012  WBC 4.0 - 10.5 K/uL 4.8 7.3 11.9(H)  Hemoglobin 12.0 - 15.0 g/dL 12.6 12.5 9.9(L)  Hematocrit 36.0 - 46.0 % 39.8 38.8 31.7(L)  Platelets 150 - 400 K/uL 177 205 196   CMP Latest Ref Rng & Units 06/25/2016 04/23/2014 01/13/2012  Glucose 65 - 99 mg/dL 69 73 155(H)  BUN 6 - 20 mg/dL 15 15.8 10  Creatinine 0.44 - 1.00 mg/dL 0.77 1.0 0.85  Sodium 135 - 145 mmol/L 143 141 136  Potassium 3.5 - 5.1 mmol/L 4.1 4.3 4.2  Chloride 101 - 111 mmol/L 106 - 100  CO2 22 -  32 mmol/L 28 26 26   Calcium 8.9 - 10.3 mg/dL 9.5 9.7 9.2  Total Protein 6.4 - 8.3 g/dL - 7.1 6.6  Total Bilirubin 0.20 - 1.20 mg/dL - 0.29 0.5  Alkaline Phos 40 - 150 U/L - 89 93  AST 5 - 34 U/L - 20 20  ALT 0 - 55 U/L - 21 21   Hgb A1C = 6.3 two weeks ago Radiologic Studies:  She has had at least 10 CT scans of the abdomen and pelvis over the last decade. No mention of a mass or obstruction is seen on any of them.  Assessment: Encounter Diagnoses  Name Primary?  . Slow transit constipation Yes  . Irritable bowel syndrome with both constipation and diarrhea   . RLQ abdominal pain   . Nausea without vomiting   Symptoms somewhat difficult to characterize, probably largely functional with a contribution of medication side effect less likely something obstructive. Seems unlikely to be gastroparesis with normal hemoglobin A1c  Plan: EGD and colonoscopy, must be done in the hospital endoscopy lab due to her BMI. Meanwhile, recommended as needed use of MiraLAX when constipated.  Thank you for the courtesy of this consult.  Please call me with any questions or concerns.  [name]  CC: Pcp Not In System

## 2016-07-14 NOTE — Patient Instructions (Addendum)
Miralax 1 capful daily if no BM for 2 days  We will contact you to schedule your upper endoscopy and colonoscopy.  If you are age 52 or older, your body mass index should be between 23-30. Your Body mass index is 53.73 kg/m. If this is out of the aforementioned range listed, please consider follow up with your Primary Care Provider.  If you are age 33 or younger, your body mass index should be between 19-25. Your Body mass index is 53.73 kg/m. If this is out of the aformentioned range listed, please consider follow up with your Primary Care Provider.   Thank you for choosing [facility]  Dr Wilfrid Lund III

## 2016-10-06 ENCOUNTER — Telehealth: Payer: Self-pay | Admitting: Gastroenterology

## 2016-10-06 NOTE — Telephone Encounter (Signed)
Patient still needs EGD/colonoscopy (see 07/2016 office visit)  Must be done at hospital b/c BMI  I can do it during my April hospital week (16th -20th).  Would be best to do it at [facility] lab since I will be on service there. [facility] if need be.  Must be early AM case, i.e. 7:30 or 8, Tues,Wed,Thurs Suprep

## 2016-10-23 ENCOUNTER — Other Ambulatory Visit: Payer: Self-pay

## 2016-10-23 DIAGNOSIS — K5900 Constipation, unspecified: Secondary | ICD-10-CM

## 2016-10-23 DIAGNOSIS — R1031 Right lower quadrant pain: Secondary | ICD-10-CM

## 2016-10-23 DIAGNOSIS — R112 Nausea with vomiting, unspecified: Secondary | ICD-10-CM

## 2016-10-23 DIAGNOSIS — K582 Mixed irritable bowel syndrome: Secondary | ICD-10-CM

## 2016-10-23 NOTE — Telephone Encounter (Signed)
Pt has been scheduled at [facility] on 12-03-2016 at 730 am. Pt will come to the office to pick up and go over her instructions.

## 2016-11-24 ENCOUNTER — Other Ambulatory Visit: Payer: Self-pay

## 2016-11-24 DIAGNOSIS — K581 Irritable bowel syndrome with constipation: Secondary | ICD-10-CM

## 2016-11-24 DIAGNOSIS — R112 Nausea with vomiting, unspecified: Secondary | ICD-10-CM

## 2016-11-24 MED ORDER — NA SULFATE-K SULFATE-MG SULF 17.5-3.13-1.6 GM/177ML PO SOLN: 1.0000 | ORAL | 0 refills | Status: DC

## 2016-11-26 ENCOUNTER — Other Ambulatory Visit (HOSPITAL_COMMUNITY)
Admission: RE | Admit: 2016-11-26 | Discharge: 2016-11-26 | Disposition: A | Discharge status: Home / Self Care | Payer: Medicare Other | Source: Other Acute Inpatient Hospital | Attending: Internal Medicine | Admitting: Internal Medicine

## 2016-11-26 ENCOUNTER — Encounter (HOSPITAL_BASED_OUTPATIENT_CLINIC_OR_DEPARTMENT_OTHER): Payer: Self-pay

## 2016-11-26 ENCOUNTER — Encounter (HOSPITAL_BASED_OUTPATIENT_CLINIC_OR_DEPARTMENT_OTHER): Payer: Medicare Other | Attending: Internal Medicine

## 2016-11-26 DIAGNOSIS — E11621 Type 2 diabetes mellitus with foot ulcer: Secondary | ICD-10-CM | POA: Diagnosis present

## 2016-11-26 DIAGNOSIS — L97819 Non-pressure chronic ulcer of other part of right lower leg with unspecified severity: Secondary | ICD-10-CM | POA: Insufficient documentation

## 2016-11-26 DIAGNOSIS — I10 Essential (primary) hypertension: Secondary | ICD-10-CM | POA: Diagnosis not present

## 2016-11-26 DIAGNOSIS — F17210 Nicotine dependence, cigarettes, uncomplicated: Secondary | ICD-10-CM | POA: Diagnosis not present

## 2016-11-26 DIAGNOSIS — E1140 Type 2 diabetes mellitus with diabetic neuropathy, unspecified: Secondary | ICD-10-CM | POA: Diagnosis not present

## 2016-11-26 DIAGNOSIS — E11622 Type 2 diabetes mellitus with other skin ulcer: Secondary | ICD-10-CM | POA: Insufficient documentation

## 2016-11-26 DIAGNOSIS — Z6841 Body Mass Index (BMI) 40.0 and over, adult: Secondary | ICD-10-CM | POA: Diagnosis not present

## 2016-11-29 LAB — AEROBIC CULTURE  (SUPERFICIAL SPECIMEN): CULTURE: NORMAL

## 2016-11-29 LAB — AEROBIC CULTURE W GRAM STAIN (SUPERFICIAL SPECIMEN)

## 2016-12-02 ENCOUNTER — Encounter (HOSPITAL_COMMUNITY): Payer: Self-pay | Admitting: *Deleted

## 2016-12-03 ENCOUNTER — Ambulatory Visit (HOSPITAL_COMMUNITY)
Admission: RE | Admit: 2016-12-03 | Discharge: 2016-12-03 | Disposition: A | Discharge status: Home / Self Care | Payer: Medicare Other | Source: Ambulatory Visit | Attending: Gastroenterology | Admitting: Gastroenterology

## 2016-12-03 ENCOUNTER — Ambulatory Visit (HOSPITAL_COMMUNITY): Payer: Medicare Other | Admitting: Anesthesiology

## 2016-12-03 ENCOUNTER — Encounter (HOSPITAL_COMMUNITY): Payer: Self-pay | Admitting: *Deleted

## 2016-12-03 ENCOUNTER — Encounter (HOSPITAL_COMMUNITY)
Admission: RE | Disposition: A | Discharge status: Home / Self Care | Payer: Self-pay | Source: Ambulatory Visit | Attending: Gastroenterology

## 2016-12-03 DIAGNOSIS — Z79899 Other long term (current) drug therapy: Secondary | ICD-10-CM | POA: Insufficient documentation

## 2016-12-03 DIAGNOSIS — Z7984 Long term (current) use of oral hypoglycemic drugs: Secondary | ICD-10-CM | POA: Diagnosis not present

## 2016-12-03 DIAGNOSIS — E785 Hyperlipidemia, unspecified: Secondary | ICD-10-CM | POA: Diagnosis not present

## 2016-12-03 DIAGNOSIS — R194 Change in bowel habit: Secondary | ICD-10-CM | POA: Insufficient documentation

## 2016-12-03 DIAGNOSIS — R112 Nausea with vomiting, unspecified: Secondary | ICD-10-CM

## 2016-12-03 DIAGNOSIS — Z8719 Personal history of other diseases of the digestive system: Secondary | ICD-10-CM | POA: Insufficient documentation

## 2016-12-03 DIAGNOSIS — Z9049 Acquired absence of other specified parts of digestive tract: Secondary | ICD-10-CM | POA: Insufficient documentation

## 2016-12-03 DIAGNOSIS — R1031 Right lower quadrant pain: Secondary | ICD-10-CM

## 2016-12-03 DIAGNOSIS — M797 Fibromyalgia: Secondary | ICD-10-CM | POA: Insufficient documentation

## 2016-12-03 DIAGNOSIS — E119 Type 2 diabetes mellitus without complications: Secondary | ICD-10-CM | POA: Insufficient documentation

## 2016-12-03 DIAGNOSIS — D128 Benign neoplasm of rectum: Secondary | ICD-10-CM | POA: Diagnosis not present

## 2016-12-03 DIAGNOSIS — K582 Mixed irritable bowel syndrome: Secondary | ICD-10-CM

## 2016-12-03 DIAGNOSIS — F419 Anxiety disorder, unspecified: Secondary | ICD-10-CM | POA: Diagnosis not present

## 2016-12-03 DIAGNOSIS — Z90710 Acquired absence of both cervix and uterus: Secondary | ICD-10-CM | POA: Diagnosis not present

## 2016-12-03 DIAGNOSIS — I10 Essential (primary) hypertension: Secondary | ICD-10-CM | POA: Diagnosis not present

## 2016-12-03 DIAGNOSIS — K219 Gastro-esophageal reflux disease without esophagitis: Secondary | ICD-10-CM | POA: Diagnosis not present

## 2016-12-03 DIAGNOSIS — Z7951 Long term (current) use of inhaled steroids: Secondary | ICD-10-CM | POA: Diagnosis not present

## 2016-12-03 DIAGNOSIS — R1084 Generalized abdominal pain: Secondary | ICD-10-CM | POA: Diagnosis present

## 2016-12-03 DIAGNOSIS — Z885 Allergy status to narcotic agent status: Secondary | ICD-10-CM | POA: Diagnosis not present

## 2016-12-03 DIAGNOSIS — Z888 Allergy status to other drugs, medicaments and biological substances status: Secondary | ICD-10-CM | POA: Diagnosis not present

## 2016-12-03 DIAGNOSIS — Z6841 Body Mass Index (BMI) 40.0 and over, adult: Secondary | ICD-10-CM | POA: Insufficient documentation

## 2016-12-03 DIAGNOSIS — Z880 Allergy status to penicillin: Secondary | ICD-10-CM | POA: Diagnosis not present

## 2016-12-03 DIAGNOSIS — K5900 Constipation, unspecified: Secondary | ICD-10-CM | POA: Diagnosis not present

## 2016-12-03 HISTORY — DX: Personal history of urinary calculi: Z87.442

## 2016-12-03 HISTORY — DX: Personal history of urinary (tract) infections: Z87.440

## 2016-12-03 HISTORY — DX: Other injury of unspecified body region, initial encounter: T14.8XXA

## 2016-12-03 HISTORY — DX: Headache: R51

## 2016-12-03 HISTORY — DX: Headache, unspecified: R51.9

## 2016-12-03 HISTORY — PX: COLONOSCOPY WITH PROPOFOL: SHX5780

## 2016-12-03 HISTORY — DX: Anxiety disorder, unspecified: F41.9

## 2016-12-03 HISTORY — DX: Family history of other specified conditions: Z84.89

## 2016-12-03 HISTORY — PX: ESOPHAGOGASTRODUODENOSCOPY (EGD) WITH PROPOFOL: SHX5813

## 2016-12-03 HISTORY — DX: Gastro-esophageal reflux disease without esophagitis: K21.9

## 2016-12-03 HISTORY — DX: Other specified postprocedural states: Z98.890

## 2016-12-03 HISTORY — DX: Nausea with vomiting, unspecified: R11.2

## 2016-12-03 HISTORY — DX: Anemia, unspecified: D64.9

## 2016-12-03 HISTORY — DX: Neoplasm of unspecified behavior of unspecified site: D49.9

## 2016-12-03 HISTORY — DX: Pneumonia, unspecified organism: J18.9

## 2016-12-03 LAB — GLUCOSE, CAPILLARY: Glucose-Capillary: 116 mg/dL — ABNORMAL HIGH (ref 65–99)

## 2016-12-03 SURGERY — ESOPHAGOGASTRODUODENOSCOPY (EGD) WITH PROPOFOL
Anesthesia: General

## 2016-12-03 MED ORDER — ONDANSETRON HCL 4 MG/2ML IJ SOLN
INTRAMUSCULAR | Status: DC | PRN
  Administered 2016-12-03: 4 mg via INTRAVENOUS

## 2016-12-03 MED ORDER — SODIUM CHLORIDE 0.9 % IV SOLN: INTRAVENOUS | Status: DC

## 2016-12-03 MED ORDER — PROPOFOL 10 MG/ML IV BOLUS
INTRAVENOUS | Status: DC | PRN
  Administered 2016-12-03: 20 mg via INTRAVENOUS

## 2016-12-03 MED ORDER — PROPOFOL 10 MG/ML IV BOLUS
INTRAVENOUS | Status: AC
  Filled 2016-12-03: qty 60

## 2016-12-03 MED ORDER — MIDAZOLAM HCL 2 MG/2ML IJ SOLN
INTRAMUSCULAR | Status: DC | PRN
  Administered 2016-12-03: 2 mg via INTRAVENOUS

## 2016-12-03 MED ORDER — ONDANSETRON HCL 4 MG/2ML IJ SOLN
INTRAMUSCULAR | Status: AC
  Filled 2016-12-03: qty 2

## 2016-12-03 MED ORDER — LIDOCAINE 2% (20 MG/ML) 5 ML SYRINGE
INTRAMUSCULAR | Status: AC
  Filled 2016-12-03: qty 5

## 2016-12-03 MED ORDER — PROPOFOL 500 MG/50ML IV EMUL
INTRAVENOUS | Status: DC | PRN
  Administered 2016-12-03: 140 ug/kg/min via INTRAVENOUS

## 2016-12-03 MED ORDER — LIDOCAINE 2% (20 MG/ML) 5 ML SYRINGE
INTRAMUSCULAR | Status: DC | PRN
  Administered 2016-12-03: 100 mg via INTRAVENOUS

## 2016-12-03 MED ORDER — LACTATED RINGERS IV SOLN
INTRAVENOUS | Status: DC
  Administered 2016-12-03: 1000 mL via INTRAVENOUS

## 2016-12-03 MED ORDER — MIDAZOLAM HCL 2 MG/2ML IJ SOLN
INTRAMUSCULAR | Status: AC
  Filled 2016-12-03: qty 2

## 2016-12-03 SURGICAL SUPPLY — 25 items

## 2016-12-03 NOTE — Op Note (Signed)
[facility] Patient Name: Gina [name] Procedure Date: 12/03/2016 MRN: [id number] Attending MD: Estill Cotta. Loletha Carrow , MD Date of Birth: [birth year] CSN: [id number] Age: 51 Admit Type: Outpatient Procedure:                Colonoscopy Indications:              Generalized abdominal pain, Constipation Providers:                Mallie Mussel L. Loletha Carrow, MD, Cherylynn Ridges, Technician Referring MD:              Medicines:                Monitored Anesthesia Care Complications:            No immediate complications. Estimated Blood Loss:     Estimated blood loss: none. Procedure:                Pre-Anesthesia Assessment:                           - Prior to the procedure, a History and Physical                            was performed, and patient medications and                            allergies were reviewed. The patient's tolerance of                            previous anesthesia was also reviewed. The risks                            and benefits of the procedure and the sedation                            options and risks were discussed with the patient.                            All questions were answered, and informed consent                            was obtained. Prior Anticoagulants: The patient has                            taken no previous anticoagulant or antiplatelet                            agents. ASA Grade Assessment: III - A patient with                            severe systemic disease. After reviewing the risks                            and benefits, the patient was deemed in  satisfactory condition to undergo the procedure.                           After obtaining informed consent, the colonoscope                            was passed under direct vision. Throughout the                            procedure, the patient's blood pressure, pulse, and                            oxygen saturations were monitored continuously. The                            EC-[id number] ([id number]) scope was introduced through                            the anus and advanced to the the cecum, identified                            by appendiceal orifice and ileocecal valve. The                            colonoscopy was performed with moderate difficulty                            due to significant looping and the patient's body                            habitus. Successful completion of the procedure was                            aided by using manual pressure. The patient                            tolerated the procedure well. The quality of the                            bowel preparation was excellent. The ileocecal                            valve, appendiceal orifice, and rectum were                            photographed. The quality of the bowel preparation                            was evaluated using the BBPS [facility] Bowel                            Preparation Scale) with scores of: Right Colon = 3,  Transverse Colon = 3 and Left Colon = 3 (entire                            mucosa seen well with no residual staining, small                            fragments of stool or opaque liquid). The total                            BBPS score equals 9. The bowel preparation used was                            SUPREP. Findings:      The perianal and digital rectal examinations were normal.      A 4 mm polyp was found in the rectum. The polyp was sessile. The polyp       was removed with a cold snare. Resection and retrieval were complete.      The exam was otherwise without abnormality on direct and retroflexion       views. Impression:               - One 4 mm polyp in the rectum, removed with a cold                            snare. Resected and retrieved.                           - The examination was otherwise normal on direct                            and retroflexion views.                            Overall clinical scenario most consistent with IBS                            and possibly side effect of one/more medicines as                            well as diabetes. Moderate Sedation:      N/A- Per Anesthesia Care Recommendation:           - Patient has a contact number available for                            emergencies. The signs and symptoms of potential                            delayed complications were discussed with the                            patient. Return to normal activities tomorrow.                            Written discharge instructions were provided  to the                            patient.                           - Resume previous diet.                           - No aspirin, ibuprofen, naproxen, or other                            non-steroidal anti-inflammatory drugs for 7 days                            after polyp removal.                           - Await pathology results.                           - Repeat colonoscopy is recommended for                            surveillance. The colonoscopy date will be                            determined after pathology results from today's                            exam become available for review. Procedure Code(s):        --- Professional ---                           [phone], Colonoscopy, flexible; with removal of                            tumor(s), polyp(s), or other lesion(s) by snare                            technique Diagnosis Code(s):        --- Professional ---                           K62.1, Rectal polyp                           R10.84, Generalized abdominal pain                           K59.00, Constipation, unspecified CPT copyright 2016 American Medical Association. All rights reserved. The codes documented in this report are preliminary and upon coder review may  be revised to meet current compliance requirements. Emon Miggins L. Loletha Carrow, MD 12/03/2016 8:11:58 AM This report has been signed  electronically. Number of Addenda: 0

## 2016-12-03 NOTE — Transfer of Care (Signed)
Immediate Anesthesia Transfer of Care Note  Patient: Gina [name]  Procedure(s) Performed: Procedure(s): ESOPHAGOGASTRODUODENOSCOPY (EGD) WITH PROPOFOL (N/A) COLONOSCOPY WITH PROPOFOL (N/A)  Patient Location: Endoscopy Unit  Anesthesia Type:MAC  Level of Consciousness: awake  Airway & Oxygen Therapy: Patient Spontanous Breathing and Patient connected to nasal cannula oxygen  Post-op Assessment: Report given to RN and Post -op Vital signs reviewed and stable  Post vital signs: Reviewed and stable  Last Vitals:  Vitals:   12/03/16 0644 12/03/16 0813  BP: (!) 141/60   Pulse: 81 77  Resp: 20 15  Temp: 36.6 C 36.7 C    Last Pain:  Vitals:   12/03/16 0813  TempSrc: Oral         Complications: No apparent anesthesia complications

## 2016-12-03 NOTE — Anesthesia Preprocedure Evaluation (Addendum)
Anesthesia Evaluation  Patient identified by MRN, date of birth, ID band Patient awake    Reviewed: Allergy & Precautions, NPO status , Patient's Chart, lab work & pertinent test results  History of Anesthesia Complications (+) PONV, Family history of anesthesia reaction and history of anesthetic complications  Airway Mallampati: III  TM Distance: >3 FB Neck ROM: Full    Dental  (+) Poor Dentition, Missing, Chipped   Pulmonary shortness of breath and with exertion, asthma , pneumonia, resolved, Current Smoker,    Pulmonary exam normal breath sounds clear to auscultation       Cardiovascular hypertension, Normal cardiovascular exam Rhythm:Regular Rate:Normal     Neuro/Psych  Headaches, Anxiety negative neurological ROS  negative psych ROS   GI/Hepatic Neg liver ROS, GERD  Medicated and Controlled,IBS RLQ pain N/V   Endo/Other  negative endocrine ROSdiabetes, Well Controlled, Type 2, Oral Hypoglycemic Agents, Insulin DependentMorbid obesity  Renal/GU negative Renal ROSHx/o renal calculi  negative genitourinary   Musculoskeletal negative musculoskeletal ROS (+) Arthritis , Osteoarthritis,  Fibromyalgia -  Abdominal (+) + obese,   Peds negative pediatric ROS (+)  Hematology negative hematology ROS (+) anemia , Hx/o eosinophilia   Anesthesia Other Findings   Reproductive/Obstetrics negative OB ROS                            Anesthesia Physical Anesthesia Plan  ASA: III  Anesthesia Plan: MAC   Post-op Pain Management:    Induction: Intravenous  Airway Management Planned: Nasal Cannula  Additional Equipment:   Intra-op Plan:   Post-operative Plan:   Informed Consent: I have reviewed the patients History and Physical, chart, labs and discussed the procedure including the risks, benefits and alternatives for the proposed anesthesia with the patient or authorized representative who  has indicated his/her understanding and acceptance.   Dental advisory given  Plan Discussed with: Anesthesiologist, CRNA and Surgeon  Anesthesia Plan Comments:        Anesthesia Quick Evaluation

## 2016-12-03 NOTE — H&P (Signed)
History:  This patient presents for endoscopic testing for abdominal pain and altered bowel habits.  Gina [name] Referring physician: Pcp Not In System  Past Medical History: Past Medical History:  Diagnosis Date  . Anemia   . Anxiety   . Arthritis   . Asthma   . Chronic pain   . Diabetes mellitus    type 2  . Family history of adverse reaction to anesthesia    youngest daughter hard to wake up both daughters post op n/v  . Fibromyalgia   . GERD (gastroesophageal reflux disease)   . Headache    migraines  . History of kidney stones    2 big stones in left kidney now  . History of recurrent UTIs    last uti 3 weeks ago  . Hyperlipidemia   . Hypertension   . IBS (irritable bowel syndrome)   . Obesities, morbid ([place])   . Pneumonia 06/2016  . PONV (postoperative nausea and vomiting) 2004   came back after hystectomy bowels locked up on life support for 1 month   . Sinusitis   . 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 Wound discharge    open wound right leg going to wound center changes dressing qday with santyl cream quarter size wound     Past Surgical History: Past Surgical History:  Procedure Laterality Date  . ABDOMINAL HYSTERECTOMY     1 ovary left  . CARPAL TUNNEL RELEASE Right   . CESAREAN SECTION     x 2  . CHOLECYSTECTOMY    . CYSTECTOMY     left foot  . LITHOTRIPSY    . MULTIPLE TOOTH EXTRACTIONS      Allergies: Allergies  Allergen Reactions  . Penicillins Hives and Swelling    Swelling all over body  Has patient had a PCN reaction causing immediate rash, facial/tongue/throat swelling, SOB or lightheadedness with hypotension: Yes Has patient had a PCN reaction causing severe rash involving mucus membranes or skin necrosis: Yes Has patient had a PCN reaction that required hospitalization Unknown Has patient had a PCN reaction occurring within the last 10 years: No  If all of the above answers are "NO", then may proceed with  Cephalosporin use.   Carlton Adam [Propoxyphene N-Acetaminophen] Nausea And Vomiting  . Morphine And Related Itching    Outpatient Meds: Current Facility-Administered Medications  Medication Dose Route Frequency Provider Last Rate Last Dose  . 0.9 %  sodium chloride infusion   Intravenous Continuous [name], MD      . lactated ringers infusion   Intravenous Continuous [name], MD 10 mL/hr at 12/03/16 0714 1,000 mL at 12/03/16 0714      ___________________________________________________________________ Objective   Exam:  BP (!) 141/60   Pulse 81   Temp 97.8 F (36.6 C) (Oral)   Resp 20   Ht 5\' 4"  (1.626 m)   Wt (!) 313 lb (142 kg)   SpO2 92%   BMI 53.73 kg/m    CV: RRR without murmur, S1/S2, no JVD, no peripheral edema  Resp: clear to auscultation bilaterally, normal RR and effort noted  GI: morbidly obese, soft, no tenderness, with active bowel sounds. No guarding or palpable organomegaly noted.  Neuro: awake, alert and oriented x 3. Normal gross motor function and fluent speech   Assessment:  Abdominal pain, altered bowel habits  Plan:  EGD/colonoscopy  Patient at increased risk for cardiopulmonary complications  of procedure due to medical comorbidities.     [name]

## 2016-12-03 NOTE — Interval H&P Note (Signed)
History and Physical Interval Note:  12/03/2016 7:32 AM  Gina [name]  has presented today for surgery, with the diagnosis of Constipation,IBS, RLQ pain, Nausea and vomiting   The various methods of treatment have been discussed with the patient and family. After consideration of risks, benefits and other options for treatment, the patient has consented to  Procedure(s): ESOPHAGOGASTRODUODENOSCOPY (EGD) WITH PROPOFOL (N/A) COLONOSCOPY WITH PROPOFOL (N/A) as a surgical intervention .  The patient's history has been reviewed, patient examined, no change in status, stable for surgery.  I have reviewed the patient's chart and labs.  Questions were answered to the patient's satisfaction.     [name]

## 2016-12-03 NOTE — Op Note (Signed)
[facility] Patient Name: Gina [name] Procedure Date: 12/03/2016 MRN: [id number] Attending MD: Estill Cotta. Loletha Carrow , MD Date of Birth: [birth year] CSN: [id number] Age: 51 Admit Type: Outpatient Procedure:                Upper GI endoscopy Indications:              Generalized abdominal pain, Nausea Providers:                Mallie Mussel L. Loletha Carrow, MD, Elmer Ramp. Tilden Dome, RN, [employer], Technician, Danley Danker, CRNA Referring MD:              Medicines:                Monitored Anesthesia Care Complications:            No immediate complications. Estimated Blood Loss:     Estimated blood loss: none. Procedure:                Pre-Anesthesia Assessment:                           - Prior to the procedure, a History and Physical                            was performed, and patient medications and                            allergies were reviewed. The patient's tolerance of                            previous anesthesia was also reviewed. The risks                            and benefits of the procedure and the sedation                            options and risks were discussed with the patient.                            All questions were answered, and informed consent                            was obtained. Prior Anticoagulants: The patient has                            taken no previous anticoagulant or antiplatelet                            agents. ASA Grade Assessment: III - A patient with                            severe systemic disease. After reviewing the risks  and benefits, the patient was deemed in                            satisfactory condition to undergo the procedure.                           After obtaining informed consent, the endoscope was                            passed under direct vision. Throughout the                            procedure, the patient's blood pressure, pulse, and                 oxygen saturations were monitored continuously. The                            Endoscope was introduced through the mouth, and                            advanced to the second part of duodenum. The upper                            GI endoscopy was accomplished without difficulty.                            The patient tolerated the procedure well. Scope In: Scope Out: Findings:      The esophagus was normal.      The stomach was normal.      The cardia and gastric fundus were normal on retroflexion.      The examined duodenum was normal. Impression:               - Normal esophagus.                           - Normal stomach.                           - Normal examined duodenum.                           - No specimens collected. Moderate Sedation:      N/A- Per Anesthesia Care Recommendation:           - Patient has a contact number available for                            emergencies. The signs and symptoms of potential                            delayed complications were discussed with the                            patient. Return to normal activities tomorrow.  Written discharge instructions were provided to the                            patient.                           - Resume previous diet.                           - Continue present medications.                           - See the other procedure note for documentation of                            additional recommendations. Procedure Code(s):        --- Professional ---                           [phone], Esophagogastroduodenoscopy, flexible,                            transoral; diagnostic, including collection of                            specimen(s) by brushing or washing, when performed                            (separate procedure) Diagnosis Code(s):        --- Professional ---                           R10.84, Generalized abdominal pain                           R11.0,  Nausea CPT copyright 2016 American Medical Association. All rights reserved. The codes documented in this report are preliminary and upon coder review may  be revised to meet current compliance requirements. Henry L. Loletha Carrow, MD 12/03/2016 8:07:46 AM This report has been signed electronically. Number of Addenda: 0

## 2016-12-03 NOTE — Anesthesia Postprocedure Evaluation (Addendum)
Anesthesia Post Note  Patient: Gina [name]  Procedure(s) Performed: Procedure(s) (LRB): ESOPHAGOGASTRODUODENOSCOPY (EGD) WITH PROPOFOL (N/A) COLONOSCOPY WITH PROPOFOL (N/A)  Patient location during evaluation: PACU Anesthesia Type: General Level of consciousness: awake and alert Pain management: pain level controlled Vital Signs Assessment: post-procedure vital signs reviewed and stable Respiratory status: spontaneous breathing, nonlabored ventilation, respiratory function stable and patient connected to nasal cannula oxygen Cardiovascular status: stable and blood pressure returned to baseline Anesthetic complications: no       Last Vitals:  Vitals:   12/03/16 0813 12/03/16 0830  BP: (!) 106/33 (!) 111/39  Pulse: 77   Resp: 15   Temp: 36.7 C     Last Pain:  Vitals:   12/03/16 0813  TempSrc: Oral                 Navdeep Halt S

## 2016-12-03 NOTE — Discharge Instructions (Signed)
YOU HAD AN ENDOSCOPIC PROCEDURE TODAY: Refer to the procedure report and other information in the discharge instructions given to you for any specific questions about what was found during the examination. If this information does not answer your questions, please call [place] office at [phone] to clarify.  ° °YOU SHOULD EXPECT: Some feelings of bloating in the abdomen. Passage of more gas than usual. Walking can help get rid of the air that was put into your GI tract during the procedure and reduce the bloating. If you had a lower endoscopy (such as a colonoscopy or flexible sigmoidoscopy) you may notice spotting of blood in your stool or on the toilet paper. Some abdominal soreness may be present for a day or two, also. ° °DIET: Your first meal following the procedure should be a light meal and then it is ok to progress to your normal diet. A half-sandwich or bowl of soup is an example of a good first meal. Heavy or fried foods are harder to digest and may make you feel nauseous or bloated. Drink plenty of fluids but you should avoid alcoholic beverages for 24 hours. If you had a esophageal dilation, please see attached instructions for diet.   ° °ACTIVITY: Your care partner should take you home directly after the procedure. You should plan to take it easy, moving slowly for the rest of the day. You can resume normal activity the day after the procedure however YOU SHOULD NOT DRIVE, use power tools, machinery or perform tasks that involve climbing or major physical exertion for 24 hours (because of the sedation medicines used during the test).  ° °SYMPTOMS TO REPORT IMMEDIATELY: °A gastroenterologist can be reached at any hour. Please call [phone]  for any of the following symptoms:  °Following lower endoscopy (colonoscopy, flexible sigmoidoscopy) °Excessive amounts of blood in the stool  °Significant tenderness, worsening of abdominal pains  °Swelling of the abdomen that is new, acute  °Fever of 100° or  higher  °Following upper endoscopy (EGD, EUS, ERCP, esophageal dilation) °Vomiting of blood or coffee ground material  °New, significant abdominal pain  °New, significant chest pain or pain under the shoulder blades  °Painful or persistently difficult swallowing  °New shortness of breath  °Black, tarry-looking or red, bloody stools ° °FOLLOW UP:  °If any biopsies were taken you will be contacted by phone or by letter within the next 1-3 weeks. Call [phone]  if you have not heard about the biopsies in 3 weeks.  °Please also call with any specific questions about appointments or follow up tests. ° °

## 2016-12-04 ENCOUNTER — Encounter: Payer: Self-pay | Admitting: Gastroenterology

## 2016-12-07 ENCOUNTER — Encounter (HOSPITAL_COMMUNITY): Payer: Self-pay | Admitting: Gastroenterology

## 2016-12-10 ENCOUNTER — Encounter (HOSPITAL_BASED_OUTPATIENT_CLINIC_OR_DEPARTMENT_OTHER): Payer: Medicare Other | Attending: Internal Medicine

## 2016-12-10 DIAGNOSIS — L97912 Non-pressure chronic ulcer of unspecified part of right lower leg with fat layer exposed: Secondary | ICD-10-CM | POA: Diagnosis not present

## 2016-12-10 DIAGNOSIS — F17210 Nicotine dependence, cigarettes, uncomplicated: Secondary | ICD-10-CM | POA: Insufficient documentation

## 2016-12-10 DIAGNOSIS — J45909 Unspecified asthma, uncomplicated: Secondary | ICD-10-CM | POA: Diagnosis not present

## 2016-12-10 DIAGNOSIS — M109 Gout, unspecified: Secondary | ICD-10-CM | POA: Diagnosis not present

## 2016-12-10 DIAGNOSIS — I10 Essential (primary) hypertension: Secondary | ICD-10-CM | POA: Diagnosis not present

## 2016-12-10 DIAGNOSIS — E1140 Type 2 diabetes mellitus with diabetic neuropathy, unspecified: Secondary | ICD-10-CM | POA: Insufficient documentation

## 2016-12-10 DIAGNOSIS — M1990 Unspecified osteoarthritis, unspecified site: Secondary | ICD-10-CM | POA: Diagnosis not present

## 2016-12-10 DIAGNOSIS — E11622 Type 2 diabetes mellitus with other skin ulcer: Secondary | ICD-10-CM | POA: Insufficient documentation

## 2016-12-10 DIAGNOSIS — L03115 Cellulitis of right lower limb: Secondary | ICD-10-CM | POA: Insufficient documentation

## 2016-12-17 DIAGNOSIS — E11622 Type 2 diabetes mellitus with other skin ulcer: Secondary | ICD-10-CM | POA: Diagnosis not present

## 2016-12-24 DIAGNOSIS — E11622 Type 2 diabetes mellitus with other skin ulcer: Secondary | ICD-10-CM | POA: Diagnosis not present

## 2016-12-31 DIAGNOSIS — E11622 Type 2 diabetes mellitus with other skin ulcer: Secondary | ICD-10-CM | POA: Diagnosis not present

## 2017-01-08 ENCOUNTER — Encounter (HOSPITAL_BASED_OUTPATIENT_CLINIC_OR_DEPARTMENT_OTHER): Payer: Medicare Other | Attending: Internal Medicine

## 2017-01-08 DIAGNOSIS — E11622 Type 2 diabetes mellitus with other skin ulcer: Secondary | ICD-10-CM | POA: Diagnosis present

## 2017-01-08 DIAGNOSIS — F17210 Nicotine dependence, cigarettes, uncomplicated: Secondary | ICD-10-CM | POA: Diagnosis not present

## 2017-01-08 DIAGNOSIS — E1140 Type 2 diabetes mellitus with diabetic neuropathy, unspecified: Secondary | ICD-10-CM | POA: Diagnosis not present

## 2017-01-08 DIAGNOSIS — L97912 Non-pressure chronic ulcer of unspecified part of right lower leg with fat layer exposed: Secondary | ICD-10-CM | POA: Insufficient documentation

## 2017-01-08 DIAGNOSIS — I10 Essential (primary) hypertension: Secondary | ICD-10-CM | POA: Diagnosis not present

## 2017-01-08 DIAGNOSIS — L97221 Non-pressure chronic ulcer of left calf limited to breakdown of skin: Secondary | ICD-10-CM | POA: Insufficient documentation

## 2017-01-08 DIAGNOSIS — L03115 Cellulitis of right lower limb: Secondary | ICD-10-CM | POA: Insufficient documentation

## 2017-01-11 NOTE — Addendum Note (Signed)
Addendum  created 01/11/17 1350 by Myrtie Soman, MD   Sign clinical note

## 2017-01-15 DIAGNOSIS — E11622 Type 2 diabetes mellitus with other skin ulcer: Secondary | ICD-10-CM | POA: Diagnosis not present

## 2017-01-22 DIAGNOSIS — E11622 Type 2 diabetes mellitus with other skin ulcer: Secondary | ICD-10-CM | POA: Diagnosis not present

## 2017-01-29 DIAGNOSIS — E11622 Type 2 diabetes mellitus with other skin ulcer: Secondary | ICD-10-CM | POA: Diagnosis not present

## 2017-02-11 ENCOUNTER — Encounter (HOSPITAL_COMMUNITY): Payer: Self-pay | Admitting: Family Medicine

## 2017-02-11 ENCOUNTER — Inpatient Hospital Stay (HOSPITAL_COMMUNITY)
Admission: AD | Admit: 2017-02-11 | Discharge: 2017-02-15 | DRG: 871 | Disposition: A | Discharge status: Home Health | Payer: Medicare Other | Source: Other Acute Inpatient Hospital | Attending: Internal Medicine | Admitting: Internal Medicine

## 2017-02-11 DIAGNOSIS — B961 Klebsiella pneumoniae [K. pneumoniae] as the cause of diseases classified elsewhere: Secondary | ICD-10-CM | POA: Diagnosis present

## 2017-02-11 DIAGNOSIS — J4520 Mild intermittent asthma, uncomplicated: Secondary | ICD-10-CM | POA: Diagnosis present

## 2017-02-11 DIAGNOSIS — N3000 Acute cystitis without hematuria: Secondary | ICD-10-CM | POA: Diagnosis not present

## 2017-02-11 DIAGNOSIS — R6520 Severe sepsis without septic shock: Secondary | ICD-10-CM | POA: Diagnosis present

## 2017-02-11 DIAGNOSIS — N2889 Other specified disorders of kidney and ureter: Secondary | ICD-10-CM | POA: Diagnosis present

## 2017-02-11 DIAGNOSIS — G4733 Obstructive sleep apnea (adult) (pediatric): Secondary | ICD-10-CM | POA: Diagnosis present

## 2017-02-11 DIAGNOSIS — B951 Streptococcus, group B, as the cause of diseases classified elsewhere: Secondary | ICD-10-CM | POA: Diagnosis present

## 2017-02-11 DIAGNOSIS — R7881 Bacteremia: Secondary | ICD-10-CM | POA: Diagnosis present

## 2017-02-11 DIAGNOSIS — R0600 Dyspnea, unspecified: Secondary | ICD-10-CM

## 2017-02-11 DIAGNOSIS — N390 Urinary tract infection, site not specified: Secondary | ICD-10-CM | POA: Diagnosis present

## 2017-02-11 DIAGNOSIS — Z794 Long term (current) use of insulin: Secondary | ICD-10-CM

## 2017-02-11 DIAGNOSIS — Z833 Family history of diabetes mellitus: Secondary | ICD-10-CM

## 2017-02-11 DIAGNOSIS — K219 Gastro-esophageal reflux disease without esophagitis: Secondary | ICD-10-CM | POA: Diagnosis present

## 2017-02-11 DIAGNOSIS — G9340 Encephalopathy, unspecified: Secondary | ICD-10-CM | POA: Diagnosis present

## 2017-02-11 DIAGNOSIS — L97828 Non-pressure chronic ulcer of other part of left lower leg with other specified severity: Secondary | ICD-10-CM | POA: Diagnosis present

## 2017-02-11 DIAGNOSIS — X58XXXA Exposure to other specified factors, initial encounter: Secondary | ICD-10-CM | POA: Diagnosis present

## 2017-02-11 DIAGNOSIS — E11622 Type 2 diabetes mellitus with other skin ulcer: Secondary | ICD-10-CM | POA: Diagnosis present

## 2017-02-11 DIAGNOSIS — M797 Fibromyalgia: Secondary | ICD-10-CM | POA: Diagnosis present

## 2017-02-11 DIAGNOSIS — Z90710 Acquired absence of both cervix and uterus: Secondary | ICD-10-CM

## 2017-02-11 DIAGNOSIS — A401 Sepsis due to streptococcus, group B: Secondary | ICD-10-CM | POA: Diagnosis present

## 2017-02-11 DIAGNOSIS — M793 Panniculitis, unspecified: Secondary | ICD-10-CM | POA: Diagnosis present

## 2017-02-11 DIAGNOSIS — K589 Irritable bowel syndrome without diarrhea: Secondary | ICD-10-CM | POA: Diagnosis present

## 2017-02-11 DIAGNOSIS — E785 Hyperlipidemia, unspecified: Secondary | ICD-10-CM | POA: Diagnosis present

## 2017-02-11 DIAGNOSIS — Z87442 Personal history of urinary calculi: Secondary | ICD-10-CM

## 2017-02-11 DIAGNOSIS — Z8249 Family history of ischemic heart disease and other diseases of the circulatory system: Secondary | ICD-10-CM

## 2017-02-11 DIAGNOSIS — G9341 Metabolic encephalopathy: Secondary | ICD-10-CM | POA: Diagnosis present

## 2017-02-11 DIAGNOSIS — D649 Anemia, unspecified: Secondary | ICD-10-CM | POA: Diagnosis present

## 2017-02-11 DIAGNOSIS — Z87440 Personal history of urinary (tract) infections: Secondary | ICD-10-CM | POA: Diagnosis present

## 2017-02-11 DIAGNOSIS — L538 Other specified erythematous conditions: Secondary | ICD-10-CM | POA: Diagnosis present

## 2017-02-11 DIAGNOSIS — I10 Essential (primary) hypertension: Secondary | ICD-10-CM | POA: Diagnosis present

## 2017-02-11 DIAGNOSIS — J189 Pneumonia, unspecified organism: Secondary | ICD-10-CM | POA: Diagnosis present

## 2017-02-11 DIAGNOSIS — Z91048 Other nonmedicinal substance allergy status: Secondary | ICD-10-CM

## 2017-02-11 DIAGNOSIS — L03311 Cellulitis of abdominal wall: Secondary | ICD-10-CM | POA: Diagnosis present

## 2017-02-11 DIAGNOSIS — E1140 Type 2 diabetes mellitus with diabetic neuropathy, unspecified: Secondary | ICD-10-CM

## 2017-02-11 DIAGNOSIS — A409 Streptococcal sepsis, unspecified: Secondary | ICD-10-CM | POA: Diagnosis present

## 2017-02-11 DIAGNOSIS — E8770 Fluid overload, unspecified: Secondary | ICD-10-CM | POA: Diagnosis present

## 2017-02-11 DIAGNOSIS — S30821A Blister (nonthermal) of abdominal wall, initial encounter: Secondary | ICD-10-CM | POA: Diagnosis present

## 2017-02-11 DIAGNOSIS — G8929 Other chronic pain: Secondary | ICD-10-CM | POA: Diagnosis present

## 2017-02-11 DIAGNOSIS — E119 Type 2 diabetes mellitus without complications: Secondary | ICD-10-CM | POA: Diagnosis not present

## 2017-02-11 DIAGNOSIS — Z880 Allergy status to penicillin: Secondary | ICD-10-CM

## 2017-02-11 DIAGNOSIS — Z885 Allergy status to narcotic agent status: Secondary | ICD-10-CM

## 2017-02-11 DIAGNOSIS — Z9049 Acquired absence of other specified parts of digestive tract: Secondary | ICD-10-CM

## 2017-02-11 DIAGNOSIS — F419 Anxiety disorder, unspecified: Secondary | ICD-10-CM | POA: Diagnosis present

## 2017-02-11 DIAGNOSIS — J45909 Unspecified asthma, uncomplicated: Secondary | ICD-10-CM | POA: Diagnosis present

## 2017-02-11 LAB — URINALYSIS, COMPLETE (UACMP) WITH MICROSCOPIC
BILIRUBIN URINE: NEGATIVE
GLUCOSE, UA: NEGATIVE mg/dL
HGB URINE DIPSTICK: NEGATIVE
KETONES UR: NEGATIVE mg/dL
Leukocytes, UA: NEGATIVE
NITRITE: NEGATIVE
PROTEIN: NEGATIVE mg/dL
Specific Gravity, Urine: 1.008 (ref 1.005–1.030)
pH: 5 (ref 5.0–8.0)

## 2017-02-11 LAB — GLUCOSE, CAPILLARY: GLUCOSE-CAPILLARY: 221 mg/dL — AB (ref 65–99)

## 2017-02-11 MED ORDER — INSULIN ASPART 100 UNIT/ML ~~LOC~~ SOLN
0.0000 [IU] | Freq: Three times a day (TID) | SUBCUTANEOUS | Status: DC
  Administered 2017-02-12: 7 [IU] via SUBCUTANEOUS
  Administered 2017-02-12 – 2017-02-13 (×3): 4 [IU] via SUBCUTANEOUS
  Administered 2017-02-13: 3 [IU] via SUBCUTANEOUS
  Administered 2017-02-13 – 2017-02-14 (×3): 4 [IU] via SUBCUTANEOUS
  Administered 2017-02-14: 3 [IU] via SUBCUTANEOUS
  Administered 2017-02-15: 4 [IU] via SUBCUTANEOUS

## 2017-02-11 MED ORDER — SODIUM CHLORIDE 0.9% FLUSH
10.0000 mL | Freq: Two times a day (BID) | INTRAVENOUS | Status: DC
  Administered 2017-02-12 – 2017-02-14 (×3): 10 mL

## 2017-02-11 MED ORDER — ONDANSETRON HCL 4 MG/2ML IJ SOLN: 4.0000 mg | Freq: Four times a day (QID) | INTRAMUSCULAR | Status: DC | PRN

## 2017-02-11 MED ORDER — SODIUM CHLORIDE 0.9% FLUSH: 3.0000 mL | INTRAVENOUS | Status: DC | PRN

## 2017-02-11 MED ORDER — SODIUM CHLORIDE 0.9% FLUSH
3.0000 mL | Freq: Two times a day (BID) | INTRAVENOUS | Status: DC
  Administered 2017-02-11 – 2017-02-12 (×2): 3 mL via INTRAVENOUS

## 2017-02-11 MED ORDER — POLYETHYLENE GLYCOL 3350 17 G PO PACK: 17.0000 g | PACK | Freq: Every day | ORAL | Status: DC | PRN

## 2017-02-11 MED ORDER — ONDANSETRON HCL 4 MG PO TABS
4.0000 mg | ORAL_TABLET | Freq: Four times a day (QID) | ORAL | Status: DC | PRN
  Administered 2017-02-11: 4 mg via ORAL
  Filled 2017-02-11: qty 1

## 2017-02-11 MED ORDER — FLUTICASONE PROPIONATE 50 MCG/ACT NA SUSP: 1.0000 | Freq: Every day | NASAL | Status: DC | PRN

## 2017-02-11 MED ORDER — SODIUM CHLORIDE 0.9% FLUSH
10.0000 mL | INTRAVENOUS | Status: DC | PRN
  Administered 2017-02-12: 10 mL
  Filled 2017-02-11: qty 40

## 2017-02-11 MED ORDER — LISINOPRIL 20 MG PO TABS
20.0000 mg | ORAL_TABLET | Freq: Every day | ORAL | Status: DC
  Administered 2017-02-12 – 2017-02-15 (×4): 20 mg via ORAL
  Filled 2017-02-11: qty 2
  Filled 2017-02-11 (×3): qty 1

## 2017-02-11 MED ORDER — LISINOPRIL-HYDROCHLOROTHIAZIDE 20-25 MG PO TABS: 1.0000 | ORAL_TABLET | Freq: Every day | ORAL | Status: DC

## 2017-02-11 MED ORDER — HYDRALAZINE HCL 20 MG/ML IJ SOLN
10.0000 mg | INTRAMUSCULAR | Status: DC | PRN
  Filled 2017-02-11: qty 0.5

## 2017-02-11 MED ORDER — DEXTROSE 5 % IV SOLN
2.0000 g | INTRAVENOUS | Status: DC
  Administered 2017-02-12 – 2017-02-15 (×4): 2 g via INTRAVENOUS
  Filled 2017-02-11 (×4): qty 2

## 2017-02-11 MED ORDER — ATORVASTATIN CALCIUM 10 MG PO TABS
20.0000 mg | ORAL_TABLET | Freq: Every day | ORAL | Status: DC
  Administered 2017-02-12 – 2017-02-14 (×3): 20 mg via ORAL
  Filled 2017-02-11 (×3): qty 2

## 2017-02-11 MED ORDER — HYDROCHLOROTHIAZIDE 25 MG PO TABS
25.0000 mg | ORAL_TABLET | Freq: Every day | ORAL | Status: DC
  Administered 2017-02-12 – 2017-02-15 (×4): 25 mg via ORAL
  Filled 2017-02-11 (×4): qty 1

## 2017-02-11 MED ORDER — ALPRAZOLAM 1 MG PO TABS
1.0000 mg | ORAL_TABLET | Freq: Two times a day (BID) | ORAL | Status: DC | PRN
  Administered 2017-02-12 – 2017-02-14 (×3): 1 mg via ORAL
  Filled 2017-02-11 (×3): qty 1

## 2017-02-11 MED ORDER — SODIUM CHLORIDE 0.9% FLUSH
3.0000 mL | Freq: Two times a day (BID) | INTRAVENOUS | Status: DC
  Administered 2017-02-11 – 2017-02-14 (×3): 3 mL via INTRAVENOUS

## 2017-02-11 MED ORDER — ACETAMINOPHEN 650 MG RE SUPP: 650.0000 mg | Freq: Four times a day (QID) | RECTAL | Status: DC | PRN

## 2017-02-11 MED ORDER — SODIUM CHLORIDE 0.9 % IV SOLN: 250.0000 mL | INTRAVENOUS | Status: DC | PRN

## 2017-02-11 MED ORDER — HEPARIN SODIUM (PORCINE) 5000 UNIT/ML IJ SOLN
5000.0000 [IU] | Freq: Three times a day (TID) | INTRAMUSCULAR | Status: DC
  Administered 2017-02-11 – 2017-02-15 (×11): 5000 [IU] via SUBCUTANEOUS
  Filled 2017-02-11 (×10): qty 1

## 2017-02-11 MED ORDER — ACETAMINOPHEN 325 MG PO TABS
650.0000 mg | ORAL_TABLET | Freq: Four times a day (QID) | ORAL | Status: DC | PRN
  Administered 2017-02-13: 650 mg via ORAL
  Filled 2017-02-11: qty 2

## 2017-02-11 MED ORDER — BISACODYL 5 MG PO TBEC: 5.0000 mg | DELAYED_RELEASE_TABLET | Freq: Every day | ORAL | Status: DC | PRN

## 2017-02-11 MED ORDER — CHLORHEXIDINE GLUCONATE CLOTH 2 % EX PADS
6.0000 | MEDICATED_PAD | Freq: Every day | CUTANEOUS | Status: DC
  Administered 2017-02-13 – 2017-02-15 (×3): 6 via TOPICAL

## 2017-02-11 MED ORDER — PANTOPRAZOLE SODIUM 40 MG PO TBEC
40.0000 mg | DELAYED_RELEASE_TABLET | Freq: Every day | ORAL | Status: DC
  Administered 2017-02-12 – 2017-02-15 (×4): 40 mg via ORAL
  Filled 2017-02-11 (×4): qty 1

## 2017-02-11 MED ORDER — ALBUTEROL SULFATE (2.5 MG/3ML) 0.083% IN NEBU: 2.5000 mg | INHALATION_SOLUTION | Freq: Four times a day (QID) | RESPIRATORY_TRACT | Status: DC | PRN

## 2017-02-11 MED ORDER — INSULIN ASPART 100 UNIT/ML ~~LOC~~ SOLN
0.0000 [IU] | Freq: Every day | SUBCUTANEOUS | Status: DC
Start: 2017-02-11 — End: 2017-02-15
  Administered 2017-02-11: 2 [IU] via SUBCUTANEOUS

## 2017-02-11 MED ORDER — PREGABALIN 75 MG PO CAPS
200.0000 mg | ORAL_CAPSULE | Freq: Two times a day (BID) | ORAL | Status: DC
  Administered 2017-02-11 – 2017-02-15 (×8): 200 mg via ORAL
  Filled 2017-02-11 (×8): qty 2

## 2017-02-11 MED ORDER — BUTALBITAL-APAP-CAFFEINE 50-325-40 MG PO TABS: 1.0000 | ORAL_TABLET | Freq: Four times a day (QID) | ORAL | Status: DC | PRN

## 2017-02-11 MED ORDER — CYCLOBENZAPRINE HCL 10 MG PO TABS
10.0000 mg | ORAL_TABLET | Freq: Three times a day (TID) | ORAL | Status: DC | PRN
  Administered 2017-02-13: 10 mg via ORAL
  Filled 2017-02-11: qty 1

## 2017-02-11 MED ORDER — INSULIN ASPART 100 UNIT/ML ~~LOC~~ SOLN
6.0000 [IU] | Freq: Three times a day (TID) | SUBCUTANEOUS | Status: DC
  Administered 2017-02-12 – 2017-02-15 (×10): 6 [IU] via SUBCUTANEOUS

## 2017-02-11 MED ORDER — HYDROCODONE-ACETAMINOPHEN 5-325 MG PO TABS
1.0000 | ORAL_TABLET | ORAL | Status: DC | PRN
  Administered 2017-02-11: 1 via ORAL
  Administered 2017-02-12 – 2017-02-15 (×17): 2 via ORAL
  Filled 2017-02-11 (×10): qty 2
  Filled 2017-02-11: qty 1
  Filled 2017-02-11 (×7): qty 2

## 2017-02-11 NOTE — H&P (Signed)
History and Physical    Gina [name] ZOX:[id number] DOB: 09/15/1965 DOA: 02/11/2017  PCP: System, Pcp Not In   Patient coming from: Home, by way of [facility]   Chief Complaint: Strep bacteremia; family and pt request transfer   HPI, [facility] course: Gina [name] is a 51 y.o. female with medical history significant for obesity (BMI 61), intermittent asthma, hypertension, chronic pain, and insulin-dependent diabetes mellitus, now presenting in transfer from [facility] where she was admitted on 02/04/2017 with sepsis secondary to Streptococcus dysgalactiae bacteremia. Patient presented to the hospital 02/04/2017 with fevers, hypotension, and tachycardia. She was suspected of having a community-acquired pneumonia, UTI, and cellulitis involving the abdominal wall. Blood cultures became positive for Streptococcus and ID consultation was obtained. Patient was treated with Rocephin and vancomycin by ID, after initially being on Levaquin and Azactam. She was fluid resuscitated on admission, blood pressure recovered, but the patient then developed volume overload with acute hypoxic respiratory failure, necessitating IV diuresis and BiPAP. She was transitioned off of BiPAP and breathing well on room air. There was a period of acute encephalopathy, suspected secondary to analgesics; B12, ammonia, and TSH were normal. Mental status returned to baseline. Patient underwent an echocardiogram which was essentially normal study. CT of the abdomen and pelvis revealed a exophytic mass on the right kidney and an attempt was made to further characterize this with MRI, but the patient could not fit in the machine. Patient seemed to be improving and hospitalists were planning to discharge to SNF in 2-3 days. Patient and her family requested transfer to the [facility] system and she was accepted to the stepdown unit at [facility].  Review of Systems:  All other systems reviewed and apart from  HPI, are negative.  Past Medical History:  Diagnosis Date  . Anemia   . Anxiety   . Arthritis   . Asthma   . Chronic pain   . Diabetes mellitus    type 2  . Family history of adverse reaction to anesthesia    youngest daughter hard to wake up both daughters post op n/v  . Fibromyalgia   . GERD (gastroesophageal reflux disease)   . Headache    migraines  . History of kidney stones    2 big stones in left kidney now  . History of recurrent UTIs    last uti 3 weeks ago  . Hyperlipidemia   . Hypertension   . IBS (irritable bowel syndrome)   . Obesities, morbid ([place])   . Pneumonia 06/2016  . PONV (postoperative nausea and vomiting) 2004   came back after hystectomy bowels locked up on life support for 1 month   . Sinusitis   . 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 Wound discharge    open wound right leg going to wound center changes dressing qday with santyl cream quarter size wound    Past Surgical History:  Procedure Laterality Date  . ABDOMINAL HYSTERECTOMY     1 ovary left  . CARPAL TUNNEL RELEASE Right   . CESAREAN SECTION     x 2  . CHOLECYSTECTOMY    . COLONOSCOPY WITH PROPOFOL N/A 12/03/2016   Procedure: COLONOSCOPY WITH PROPOFOL;  Surgeon: Doran Stabler, MD;  Location: [facility] ENDOSCOPY;  Service: Gastroenterology;  Laterality: N/A;  . CYSTECTOMY     left foot  . ESOPHAGOGASTRODUODENOSCOPY (EGD) WITH PROPOFOL N/A 12/03/2016   Procedure: ESOPHAGOGASTRODUODENOSCOPY (EGD) WITH  PROPOFOL;  Surgeon: Doran Stabler, MD;  Location: [facility];  Service: Gastroenterology;  Laterality: N/A;  . LITHOTRIPSY    . MULTIPLE TOOTH EXTRACTIONS       reports that she has been smoking Cigarettes.  She has a 16.50 pack-year smoking history. She has never used smokeless tobacco. She reports that she does not drink alcohol or use drugs.  Allergies  Allergen Reactions  . Penicillins Hives and Swelling    Swelling all over body  Has patient had a PCN  reaction causing immediate rash, facial/tongue/throat swelling, SOB or lightheadedness with hypotension: Yes Has patient had a PCN reaction causing severe rash involving mucus membranes or skin necrosis: Yes Has patient had a PCN reaction that required hospitalization Unknown Has patient had a PCN reaction occurring within the last 10 years: No  If all of the above answers are "NO", then may proceed with Cephalosporin use.   Carlton Adam [Propoxyphene N-Acetaminophen] Nausea And Vomiting  . Morphine And Related Itching  . Tape Hives    Adhesive tape    Family History  Problem Relation Age of Onset  . Cancer Mother   . Heart failure Father   . Diabetes Father   . Hypertension Father      Prior to Admission medications   Medication Sig Start Date End Date Taking? Authorizing Provider  albuterol (PROVENTIL HFA;VENTOLIN HFA) 108 (90 BASE) MCG/ACT inhaler Inhale 2 puffs into the lungs every 6 (six) hours as needed for wheezing or shortness of breath.    Yes [provider]  ALPRAZolam Duanne Moron) 1 MG tablet Take 1 mg by mouth 2 (two) times daily as needed for anxiety.    Yes [provider]  atorvastatin (LIPITOR) 20 MG tablet Take 20 mg by mouth daily.   Yes [provider]  cyclobenzaprine (FLEXERIL) 10 MG tablet Take 10 mg by mouth 3 (three) times daily as needed for muscle spasms.   Yes [provider]  fluticasone (FLONASE) 50 MCG/ACT nasal spray Place 1 spray into both nostrils daily. 06/18/16  Yes [provider]  Insulin NPH Isophane & Regular (HUMULIN 70/30 PEN [place]) Inject 30 Units into the skin 3 (three) times daily as needed (high blood sugar).   Yes [provider]  lisinopril-hydrochlorothiazide (PRINZIDE,ZESTORETIC) 20-25 MG tablet Take 1 tablet by mouth daily. 05/18/16  Yes [provider]  metFORMIN (GLUCOPHAGE) 500 MG tablet Take 500 mg by mouth 2 (two) times daily with a meal.   Yes [provider]  omeprazole  (PRILOSEC) 20 MG capsule Take 20 mg by mouth daily with breakfast.    Yes [provider]  phentermine (ADIPEX-P) 37.5 MG tablet Take 37.5 mg by mouth daily before breakfast.   Yes [provider]  pregabalin (LYRICA) 200 MG capsule Take 200 mg by mouth 2 (two) times daily.   Yes [provider]  albuterol (PROVENTIL) (2.5 MG/3ML) 0.083% nebulizer solution Take 2.5 mg by nebulization every 6 (six) hours as needed for wheezing or shortness of breath.    [provider]  butalbital-acetaminophen-caffeine (FIORICET, ESGIC) 50-325-40 MG tablet Take 1 tablet by mouth every 6 (six) hours as needed for headache or migraine.  05/22/16   [provider]  ipratropium-albuterol (DUONEB) 0.5-2.5 (3) MG/3ML SOLN Take 3 mLs by nebulization every 6 (six) hours as needed. Patient not taking: Reported on 02/11/2017 06/29/16   Hosie Poisson, MD  promethazine (PHENERGAN) 25 MG tablet Take 1 tablet (25 mg total) by mouth every 8 (eight)  hours as needed. Patient taking differently: Take 25 mg by mouth every 8 (eight) hours as needed for vomiting.  06/29/16   Hosie Poisson, MD    Physical Exam: Vitals:   02/11/17 1820 02/11/17 1900  BP: (!) 163/57 (!) 163/62  Pulse: 86 87  Resp: 16 (!) 29  Temp: 97.8 F (36.6 C)   TempSrc: Oral   SpO2: 96% 97%  Weight: (!) 161.9 kg (357 lb)   Height: 5\' 4"  (1.626 m)       Constitutional: NAD, calm, obese. Eyes: PERTLA, lids and conjunctivae normal ENMT: Mucous membranes are moist. Posterior pharynx clear of any exudate or lesions.   Neck: normal, supple, no masses, no thyromegaly Respiratory: clear to auscultation bilaterally, no wheezing, no crackles. Normal respiratory effort.   Cardiovascular: S1 & S2 heard, regular rate and rhythm. No extremity edema. No significant JVD. Abdomen: No distension, mild tenderness throughout, no rebound pain or guarding. Bowel sounds active.  Musculoskeletal: no clubbing / cyanosis. No joint  deformity upper and lower extremities. Normal muscle tone.  Skin: ~2 cm ulcer anterior left shin with thick white/yellow exudate. Warm, dry, well-perfused. Neurologic: CN 2-12 grossly intact. Sensation intact, DTR normal. Strength 5/5 in all 4 limbs.  Psychiatric: Alert and oriented x 3. Calm. Cooperative.     Labs on Admission: I have personally reviewed following labs and imaging studies  CBC: No results for input(s): WBC, NEUTROABS, HGB, HCT, MCV, PLT in the last 168 hours. Basic Metabolic Panel: No results for input(s): NA, K, CL, CO2, GLUCOSE, BUN, CREATININE, CALCIUM, MG, PHOS in the last 168 hours. GFR: CrCl cannot be calculated (Patient's most recent lab result is older than the maximum 21 days allowed.). Liver Function Tests: No results for input(s): AST, ALT, ALKPHOS, BILITOT, PROT, ALBUMIN in the last 168 hours. No results for input(s): LIPASE, AMYLASE in the last 168 hours. No results for input(s): AMMONIA in the last 168 hours. Coagulation Profile: No results for input(s): INR, PROTIME in the last 168 hours. Cardiac Enzymes: No results for input(s): CKTOTAL, CKMB, CKMBINDEX, TROPONINI in the last 168 hours. BNP (last 3 results) No results for input(s): PROBNP in the last 8760 hours. HbA1C: No results for input(s): HGBA1C in the last 72 hours. CBG: No results for input(s): GLUCAP in the last 168 hours. Lipid Profile: No results for input(s): CHOL, HDL, LDLCALC, TRIG, CHOLHDL, LDLDIRECT in the last 72 hours. Thyroid Function Tests: No results for input(s): TSH, T4TOTAL, FREET4, T3FREE, THYROIDAB in the last 72 hours. Anemia Panel: No results for input(s): VITAMINB12, FOLATE, FERRITIN, TIBC, IRON, RETICCTPCT in the last 72 hours. Urine analysis:    Component Value Date/Time   COLORURINE YELLOW 06/25/2016 1201   APPEARANCEUR CLOUDY (A) 06/25/2016 1201   LABSPEC 1.010 06/25/2016 1201   PHURINE 6.0 06/25/2016 1201   GLUCOSEU NEGATIVE 06/25/2016 1201   HGBUR MODERATE  (A) 06/25/2016 1201   BILIRUBINUR NEGATIVE 06/25/2016 1201   KETONESUR NEGATIVE 06/25/2016 1201   PROTEINUR 100 (A) 06/25/2016 1201   UROBILINOGEN 1.0 01/13/2012 1703   NITRITE POSITIVE (A) 06/25/2016 1201   LEUKOCYTESUR MODERATE (A) 06/25/2016 1201   Sepsis Labs: @LABRCNTIP (procalcitonin:4,lacticidven:4) )No results found for this or any previous visit (from the past 240 hour(s)).   Radiological Exams on Admission: No results found.  EKG: Independently reviewed. Normal sinus rhythm (from [facility], found in paper chart)   Assessment/Plan  1. Streptococcus bacteremia  - Admission blood cultures positive for Strep dysgalactiae  - ID consulted on the case at [facility]  and advised continue Rocephin 2 g IV q24h with end-date of February 19, 2017  - Repeat blood cultures were negative for 72 hrs as of 02/09/17 per ID progress notes - Continue Rocephin, repeat cultures if febrile    2. CAP  - Diagnosed on 02/04/17 at [facility]  - She was treated with Levaquin and Azactam initially d/t penicillin allergy, later transitioned to Rocephin and azithromycin which she has tolerated well - Azithromycin was completed and patient remains on Rocephin as above  - No clinical signs of PNA on presentation to [facility]   3. UTI  - Culture grew Klebsiella at [facility]  - She will be continued on Rocephin as above   4. Right renal mass  - Identified on CT abd/pelvis, with further characterization advised with MRI  - Pt could not fit in MRI machine at [facility], will likely need outpatient MRI following hospital discharge    5. Insulin-dependent DM  - A1c was 6.3% in November 2017  - Managed at home with Humulin 70/30 30 units TID "prn hyperglycemia" - Check CBG with meals and qHS  - Start Novolog 6 units TID with meals, and Novolog per high-intensity sliding-scale   6. Hypertension  - BP mildly elevated on presentation  - Continue lisinopril-HCTZ  - Start hydralazine IVP's prn     7. Chronic pain  - Pt reports chronic pain "all over"  - Continue prn Fioricet, prn Flexeril, Lyrica - Taking prn oxycodone 10 at home, was continued at [place] initially, but stopped when she developed hypoxia and AMS; will treat with Norco 5 prn sparingly    8. Normocytic anemia  - Hgb was 9.5 on day of transfer from [facility], down from 11.6 on 6/28, the day of her admission there - Denies melena or hematochezia - Suspect this is dilutional given her wt-gain of 15-20 lbs over that same interval - Repeat CBC in am    9. Asthma - Stable, no dyspnea, cough, or wheezing on arrival to [facility] - Using prn albuterol, will continue    10. Leg ulcer  - ~2 cm ulcer at anterior right shin with thick white/yellow exudate  - No significant surrounding erythema or edema, and no odor appreciated  - Wound care consultation requested    DVT prophylaxis: sq heparin Code Status: Full  Family Communication: Family updated at bedside at patient's request  Disposition Plan: Admit to SDU Consults called: None Admission status: Inpatient    Vianne Bulls, MD [facility] Pager [phone]  If 7PM-7AM, please contact night-coverage [email or web address] Password [facility]  02/11/2017, 7:31 PM

## 2017-02-12 DIAGNOSIS — R7881 Bacteremia: Secondary | ICD-10-CM

## 2017-02-12 DIAGNOSIS — E119 Type 2 diabetes mellitus without complications: Secondary | ICD-10-CM

## 2017-02-12 DIAGNOSIS — I10 Essential (primary) hypertension: Secondary | ICD-10-CM

## 2017-02-12 DIAGNOSIS — M793 Panniculitis, unspecified: Secondary | ICD-10-CM

## 2017-02-12 DIAGNOSIS — Z794 Long term (current) use of insulin: Secondary | ICD-10-CM

## 2017-02-12 LAB — COMPREHENSIVE METABOLIC PANEL
ALBUMIN: 2.3 g/dL — AB (ref 3.5–5.0)
ALK PHOS: 74 U/L (ref 38–126)
ALT: 30 U/L (ref 14–54)
ANION GAP: 6 (ref 5–15)
AST: 31 U/L (ref 15–41)
BILIRUBIN TOTAL: 0.4 mg/dL (ref 0.3–1.2)
BUN: 10 mg/dL (ref 6–20)
CO2: 32 mmol/L (ref 22–32)
Calcium: 8.5 mg/dL — ABNORMAL LOW (ref 8.9–10.3)
Chloride: 104 mmol/L (ref 101–111)
Creatinine, Ser: 0.73 mg/dL (ref 0.44–1.00)
GFR calc Af Amer: >60 mL/min (ref >60)
GFR calc non Af Amer: >60 mL/min (ref >60)
GLUCOSE: 159 mg/dL — AB (ref 65–99)
POTASSIUM: 3.9 mmol/L (ref 3.5–5.1)
SODIUM: 142 mmol/L (ref 135–145)
TOTAL PROTEIN: 5.6 g/dL — AB (ref 6.5–8.1)

## 2017-02-12 LAB — CBC WITH DIFFERENTIAL/PLATELET
BASOS ABS: 0 10*3/uL (ref 0.0–0.1)
BASOS PCT: 0 %
EOS ABS: 0.2 10*3/uL (ref 0.0–0.7)
Eosinophils Relative: 4 %
HEMATOCRIT: 27.6 % — AB (ref 36.0–46.0)
HEMOGLOBIN: 8.9 g/dL — AB (ref 12.0–15.0)
Lymphocytes Relative: 22 %
Lymphs Abs: 1.4 10*3/uL (ref 0.7–4.0)
MCH: 28.5 pg (ref 26.0–34.0)
MCHC: 32.2 g/dL (ref 30.0–36.0)
MCV: 88.5 fL (ref 78.0–100.0)
Monocytes Absolute: 0.4 10*3/uL (ref 0.1–1.0)
Monocytes Relative: 6 %
NEUTROS ABS: 4.3 10*3/uL (ref 1.7–7.7)
NEUTROS PCT: 68 %
Platelets: 186 10*3/uL (ref 150–400)
RBC: 3.12 MIL/uL — ABNORMAL LOW (ref 3.87–5.11)
RDW: 17.7 % — AB (ref 11.5–15.5)
WBC: 6.3 10*3/uL (ref 4.0–10.5)

## 2017-02-12 LAB — GLUCOSE, CAPILLARY
GLUCOSE-CAPILLARY: 166 mg/dL — AB (ref 65–99)
GLUCOSE-CAPILLARY: 181 mg/dL — AB (ref 65–99)
Glucose-Capillary: 165 mg/dL — ABNORMAL HIGH (ref 65–99)
Glucose-Capillary: 201 mg/dL — ABNORMAL HIGH (ref 65–99)

## 2017-02-12 LAB — MRSA PCR SCREENING: MRSA by PCR: NEGATIVE

## 2017-02-12 MED ORDER — LIP MEDEX EX OINT
TOPICAL_OINTMENT | CUTANEOUS | Status: AC
  Administered 2017-02-12: 16:00:00
  Filled 2017-02-12: qty 7

## 2017-02-12 MED ORDER — ONDANSETRON HCL 4 MG/2ML IJ SOLN
4.0000 mg | INTRAMUSCULAR | Status: DC | PRN
  Administered 2017-02-12 – 2017-02-15 (×11): 4 mg via INTRAVENOUS
  Filled 2017-02-12 (×11): qty 2

## 2017-02-12 MED ORDER — ONDANSETRON HCL 4 MG PO TABS
4.0000 mg | ORAL_TABLET | ORAL | Status: DC | PRN
  Administered 2017-02-12 – 2017-02-15 (×2): 4 mg via ORAL
  Filled 2017-02-12 (×3): qty 1

## 2017-02-12 MED ORDER — MICONAZOLE NITRATE POWD
Freq: Three times a day (TID) | Status: DC
  Administered 2017-02-12 – 2017-02-15 (×9): via TOPICAL
  Filled 2017-02-12: qty 100

## 2017-02-12 NOTE — Consult Note (Signed)
[place] Nurse wound consult note Reason for Consult:Intertriginous dermatitis in the inframammary, sub pannicular and bilateral inguinal areas, most severe at sub pannicular.  Intact, fluid filled (serous) blisters on the right abdomen near skin fold. Wound type: Moisture, venous insufficiency Pressure Injury POA: No.  Wounds are present on admission but are not pressure related Measurement: LLE:  2cm x 2cm x 0.5cm with 60% red and 40% yellow wound bed. Serous drainage in a small to moderate amount. Erythema and partial thickness tissue loss along entire sub pannicular skin fold weeping serous exudate.  Blistered areas in RUQ measure 9cm x 17cm with numerous intact serum-filled blisters, the largest of which measures 3cm x 2cm. Erythema without skin loss in the inframammary and bilateral inguinal areas. Wound bed:As described above Drainage (amount, consistency, odor) As described above Periwound:Intact, dry.  Bilateral LEs with hemosiderin staining. Dressing procedure/placement/frequency: I have provided Nursing with guidance for use of the moisture wicking antimicrobial textile ([employer] Ag+) and provided guidance for topical wound care to the left LE full thickness wound.  We will cover the intact blisters with white petrolatum gauze in hopes that we can prevent rupture prior to reabsorption. [place] nursing team will not follow, but will remain available to this patient, the nursing and medical teams.  Please re-consult if needed. Thanks, Maudie Flakes, MSN, RN, [place], Arther Abbott  Pager# [phone]

## 2017-02-12 NOTE — Evaluation (Signed)
Physical Therapy Evaluation Patient Details Name: Gina [name] MRN: [id number] DOB: [birth year] Today's Date: 02/12/2017   History of Present Illness  Pt admitted from [facility] with dx of sepsis, CAD, UTI, R leg ulcer and hx of DM, chronic Pain, Asthma, and Fibromyalgia  Clinical Impression  Pt admitted as above and presenting with functional mobility limitations 2* generalized weakness, ltd endurance, obesity and ambulatory balance deficits.  Pt hopes to progress to dc home with 24/7 family assist.    Follow Up Recommendations Home health PT    Equipment Recommendations  3in1 (PT) (Pt is 350 lbs)    Recommendations for Other Services OT consult     Precautions / Restrictions Precautions Precautions: Fall Restrictions Weight Bearing Restrictions: No      Mobility  Bed Mobility Overal bed mobility: Needs Assistance             General bed mobility comments: Pt sitting at EOB on arrival to room, pt states her husband assisted her  Transfers Overall transfer level: Needs assistance Equipment used: Rolling walker (2 wheeled) Transfers: Sit to/from Stand Sit to Stand: Min assist;+2 physical assistance;+2 safety/equipment         General transfer comment: cues for use of UEs to self assist  Ambulation/Gait Ambulation/Gait assistance: Min assist;+2 physical assistance;+2 safety/equipment Ambulation Distance (Feet): 5 Feet (twice) Assistive device: Rolling walker (2 wheeled) Gait Pattern/deviations: Step-through pattern;Decreased step length - right;Decreased step length - left;Shuffle Gait velocity: decr Gait velocity interpretation: Below normal speed for age/gender General Gait Details: cues for posture and position from RW - distance ltd by pt weakness.  Pt states has not been on her feet for a week  Stairs            Wheelchair Mobility    Modified Rankin (Stroke Patients Only)       Balance Overall balance assessment: Needs  assistance Sitting-balance support: Feet supported;No upper extremity supported Sitting balance-Leahy Scale: Good     Standing balance support: Bilateral upper extremity supported Standing balance-Leahy Scale: Poor                               Pertinent Vitals/Pain Pain Assessment: 0-10 Pain Score: 5  Pain Location: abdomen Pain Descriptors / Indicators: Sore Pain Intervention(s): Limited activity within patient's tolerance;Monitored during session    Home Living Family/patient expects to be discharged to:: Private residence Living Arrangements: Spouse/significant other Available Help at Discharge: Family;Available 24 hours/day Type of Home: House Home Access: Stairs to enter   [employer] of Steps: 1 Home Layout: One level Home Equipment: Walker - 2 wheels      Prior Function Level of Independence: Independent               Hand Dominance        Extremity/Trunk Assessment   Upper Extremity Assessment Upper Extremity Assessment: Generalized weakness    Lower Extremity Assessment Lower Extremity Assessment: Generalized weakness       Communication   Communication: No difficulties  Cognition Arousal/Alertness: Awake/alert Behavior During Therapy: WFL for tasks assessed/performed Overall Cognitive Status: Within Functional Limits for tasks assessed                                        General Comments      Exercises     Assessment/Plan    PT  Assessment Patient needs continued PT services  PT Problem List Decreased strength;Decreased activity tolerance;Decreased balance;Decreased mobility;Decreased knowledge of use of DME;Pain;Obesity       PT Treatment Interventions DME instruction;Gait training;Stair training;Functional mobility training;Therapeutic activities;Therapeutic exercise;Patient/family education    PT Goals (Current goals can be found in the Care Plan section)  Acute Rehab PT Goals Patient  Stated Goal: Regain IND and return home PT Goal Formulation: With patient Time For Goal Achievement: 02/26/17 Potential to Achieve Goals: Good    Frequency Min 3X/week   Barriers to discharge        Co-evaluation               AM-PAC PT "6 Clicks" Daily Activity  Outcome Measure Difficulty turning over in bed (including adjusting bedclothes, sheets and blankets)?: Total Difficulty moving from lying on back to sitting on the side of the bed? : Total Difficulty sitting down on and standing up from a chair with arms (e.g., wheelchair, bedside commode, etc,.)?: Total Help needed moving to and from a bed to chair (including a wheelchair)?: A Lot Help needed walking in hospital room?: A Lot Help needed climbing 3-5 steps with a railing? : A Lot 6 Click Score: 9    End of Session Equipment Utilized During Treatment: Gait belt Activity Tolerance: Patient tolerated treatment well;Patient limited by fatigue Patient left: in chair;with call bell/phone within reach;with nursing/sitter in room Nurse Communication: Mobility status PT Visit Diagnosis: Muscle weakness (generalized) (M62.81);Difficulty in walking, not elsewhere classified (R26.2)    Time: 0569-7948 PT Time Calculation (min) (ACUTE ONLY): 32 min   Charges:   PT Evaluation $PT Eval Low Complexity: 1 Procedure PT Treatments $Gait Training: 8-22 mins   PT G Codes:        Pg [phone]   Khalil Belote 02/12/2017, 2:04 PM

## 2017-02-12 NOTE — Progress Notes (Signed)
Notified T, Opyd MD of pt complaint of neuropathy, tingling of both feet but sensation present.  MD acknowledged awareness of patient reported complaint.

## 2017-02-12 NOTE — Progress Notes (Signed)
PROGRESS NOTE    Gina [name]  PJA:[id number] DOB: 19-Jun-1966 DOA: 02/11/2017 PCP: System, Pcp Not In    Brief Narrative:  51 year old female who presented with bacteremia. She is known to have obesity, intermittent asthma, hypertension, chronic pain, diabetes mellitus, insulin-dependent. He was transferred from [facility] after being diagnosed with sepsis due to Streptococcus dysgalactiae bacteremia. The source of infection was presumed to be community-acquired pneumonia, urinary tract infection, abdominal wall cellulitis. Apparently she received appropriate therapy for sepsis, her hospitalization was complicated by volume overload and metabolic encephalopathy. On initial physical examination blood pressure 163/57, heart rate 86, respiratory rate 16, temperature 97.8, oxygen saturation 96%. Mucous membranes moist, lungs were clear to auscultation bilaterally, heart S1-S2 present and rhythmic, the abdomen had diffuse tenderness, with no peritoneal signs, no lower extremity edema. Positive 2 cm ulcer anterior left shin with thick white, yellow exudate. Sodium 142, potassium 3.9, chloride 104, bicarbonate 32, glucose 159, BUN 10, creatinine 0.73, white count 6.3, hemoglobin 8.9, hematocrit 27.6, platelets 186 urinalysis negative for infection.  Patient was admitted to the hospital with Streptococcus bacteremia.   Assessment & Plan:   Principal Problem:   Bacteremia due to group B Streptococcus Active Problems:   CAP (community acquired pneumonia)   UTI (urinary tract infection)   Insulin-requiring or dependent type II diabetes mellitus (HCC)   Hypertension   Asthma   Volume overload   Acute encephalopathy   Right renal mass   Normocytic anemia   1. Streptococcal bacteremia. Will continue ceftriaxone, likely skin and soft tissue primary source, will continue to follow cell count and temperature curve, currently recovering sepsis. Will dc cardiac monitoring and will transfer to  [facility]. Will continue IV antibiotic therapy through July 13th.   2. Community acquired pneumonia. Patient oxygenating well on supplemental 02 per [place], no signs of volume overload, will continue bronchodilator therapy as needed. Out of bed as tolerated.   3. Abdominal panniculitis and left leg ulcer. Will continue IV ceftriaxone, will add topical antifungal therapy to the abdomen and will consult wound care. Continue pain control.   4. T2DM. Will continue insulin sliding scale for glucose coverage and monitoring, patient tolerating po well.   7. HTN. Continue blood pressure control with lisinopril and HCTZ.    DVT prophylaxis: enoxaparin  Code Status: full  Family Communication:  Disposition Plan:   Consultants:     Procedures:    Antimicrobials: Ceftriaxone   Subjective: This am with abdominal pain, lower abdomen, associated with nausea and dyspnea, no chest pain. Positive generalized weakness. Left lower extremity wound, improving.   Objective: Vitals:   02/12/17 0500 02/12/17 0600 02/12/17 0700 02/12/17 0715  BP: (!) 132/53 (!) 154/80 (!) 151/49   Pulse: 79 78 79 79  Resp: (!) 24 (!) 24 (!) 22 (!) 23  Temp:      TempSrc:      SpO2: 100% 100% 100% 100%  Weight:      Height:        Intake/Output Summary (Last 24 hours) at 02/12/17 0809 Last data filed at 02/11/17 1928  Gross per 24 hour  Intake                0 ml  Output              700 ml  Net             -700 ml   Filed Weights   02/11/17 1820  Weight: (!) 161.9 kg (357 lb)  Examination:  General exam: deconditioned E ENT: mild pallor, no icterus, oral mucosa moist.   Respiratory system: Clear to auscultation. Respiratory effort normal. Mild decreased breath sounds at bases, no wheezing, rales or rhonchi.  Cardiovascular system: S1 & S2 heard, RRR. No JVD, murmurs, rubs, gallops or clicks. No pedal edema. Gastrointestinal system: Abdomen is protuberant but nondistended, soft and nontender. No  organomegaly or masses felt. Normal bowel sounds heard. Central nervous system: Alert and oriented. No focal neurological deficits. Extremities: Symmetric 5 x 5 power. Skin: Positive erythema at the lower abdominal skin folds, positive blisters at the left lower quadrant. Tender to palpation.      Data Reviewed: I have personally reviewed following labs and imaging studies  CBC:  Recent Labs Lab 02/12/17 0333  WBC 6.3  NEUTROABS 4.3  HGB 8.9*  HCT 27.6*  MCV 88.5  PLT 829   Basic Metabolic Panel:  Recent Labs Lab 02/12/17 0333  NA 142  K 3.9  CL 104  CO2 32  GLUCOSE 159*  BUN 10  CREATININE 0.73  CALCIUM 8.5*   GFR: Estimated Creatinine Clearance: 126.7 mL/min (by C-G formula based on SCr of 0.73 mg/dL). Liver Function Tests:  Recent Labs Lab 02/12/17 0333  AST 31  ALT 30  ALKPHOS 74  BILITOT 0.4  PROT 5.6*  ALBUMIN 2.3*   No results for input(s): LIPASE, AMYLASE in the last 168 hours. No results for input(s): AMMONIA in the last 168 hours. Coagulation Profile: No results for input(s): INR, PROTIME in the last 168 hours. Cardiac Enzymes: No results for input(s): CKTOTAL, CKMB, CKMBINDEX, TROPONINI in the last 168 hours. BNP (last 3 results) No results for input(s): PROBNP in the last 8760 hours. HbA1C: No results for input(s): HGBA1C in the last 72 hours. CBG:  Recent Labs Lab 02/11/17 2207 02/12/17 0747  GLUCAP 221* 165*   Lipid Profile: No results for input(s): CHOL, HDL, LDLCALC, TRIG, CHOLHDL, LDLDIRECT in the last 72 hours. Thyroid Function Tests: No results for input(s): TSH, T4TOTAL, FREET4, T3FREE, THYROIDAB in the last 72 hours. Anemia Panel: No results for input(s): VITAMINB12, FOLATE, FERRITIN, TIBC, IRON, RETICCTPCT in the last 72 hours. Sepsis Labs: No results for input(s): PROCALCITON, LATICACIDVEN in the last 168 hours.  Recent Results (from the past 240 hour(s))  MRSA PCR Screening     Status: None   Collection Time:  02/11/17 10:49 PM  Result Value Ref Range Status   MRSA by PCR NEGATIVE NEGATIVE Final    Comment:        The GeneXpert MRSA Assay (FDA approved for NASAL specimens only), is one component of a comprehensive MRSA colonization surveillance program. It is not intended to diagnose MRSA infection nor to guide or monitor treatment for MRSA infections.          Radiology Studies: No results found.      Scheduled Meds: . atorvastatin  20 mg Oral q1800  . Chlorhexidine Gluconate Cloth  6 each Topical Daily  . heparin  5,000 Units Subcutaneous Q8H  . lisinopril  20 mg Oral Daily   And  . hydrochlorothiazide  25 mg Oral Daily  . insulin aspart  0-20 Units Subcutaneous TID WC  . insulin aspart  0-5 Units Subcutaneous QHS  . insulin aspart  6 Units Subcutaneous TID WC  . pantoprazole  40 mg Oral Daily  . pregabalin  200 mg Oral BID  . sodium chloride flush  10-40 mL Intracatheter Q12H  . sodium chloride flush  3 mL  Intravenous Q12H  . sodium chloride flush  3 mL Intravenous Q12H   Continuous Infusions: . sodium chloride    . cefTRIAXone (ROCEPHIN)  IV       LOS: 1 day        Tawni Millers, MD [facility] Pager [phone]  If 7PM-7AM, please contact night-coverage [email or web address] Password [email or web address] 02/12/2017, 8:09 AM

## 2017-02-13 DIAGNOSIS — J189 Pneumonia, unspecified organism: Secondary | ICD-10-CM

## 2017-02-13 LAB — CBC WITH DIFFERENTIAL/PLATELET
BASOS ABS: 0 10*3/uL (ref 0.0–0.1)
BASOS PCT: 0 %
EOS ABS: 0.2 10*3/uL (ref 0.0–0.7)
EOS PCT: 3 %
HEMATOCRIT: 28.1 % — AB (ref 36.0–46.0)
Hemoglobin: 8.7 g/dL — ABNORMAL LOW (ref 12.0–15.0)
Lymphocytes Relative: 26 %
Lymphs Abs: 1.3 10*3/uL (ref 0.7–4.0)
MCH: 28.1 pg (ref 26.0–34.0)
MCHC: 31 g/dL (ref 30.0–36.0)
MCV: 90.6 fL (ref 78.0–100.0)
MONO ABS: 0.4 10*3/uL (ref 0.1–1.0)
MONOS PCT: 8 %
NEUTROS ABS: 3.3 10*3/uL (ref 1.7–7.7)
Neutrophils Relative %: 63 %
PLATELETS: 180 10*3/uL (ref 150–400)
RBC: 3.1 MIL/uL — ABNORMAL LOW (ref 3.87–5.11)
RDW: 17.8 % — AB (ref 11.5–15.5)
WBC: 5.1 10*3/uL (ref 4.0–10.5)

## 2017-02-13 LAB — BASIC METABOLIC PANEL
ANION GAP: 5 (ref 5–15)
BUN: 11 mg/dL (ref 6–20)
CO2: 34 mmol/L — ABNORMAL HIGH (ref 22–32)
CREATININE: 0.92 mg/dL (ref 0.44–1.00)
Calcium: 8.4 mg/dL — ABNORMAL LOW (ref 8.9–10.3)
Chloride: 101 mmol/L (ref 101–111)
GFR calc non Af Amer: >60 mL/min (ref >60)
Glucose, Bld: 171 mg/dL — ABNORMAL HIGH (ref 65–99)
Potassium: 4.5 mmol/L (ref 3.5–5.1)
SODIUM: 140 mmol/L (ref 135–145)

## 2017-02-13 LAB — GLUCOSE, CAPILLARY
GLUCOSE-CAPILLARY: 195 mg/dL — AB (ref 65–99)
GLUCOSE-CAPILLARY: 138 mg/dL — AB (ref 65–99)
Glucose-Capillary: 169 mg/dL — ABNORMAL HIGH (ref 65–99)
Glucose-Capillary: 134 mg/dL — ABNORMAL HIGH (ref 65–99)

## 2017-02-13 LAB — HEMOGLOBIN A1C
HEMOGLOBIN A1C: 6.4 % — AB (ref 4.8–5.6)
MEAN PLASMA GLUCOSE: 137 mg/dL

## 2017-02-13 NOTE — Progress Notes (Signed)
Physical Therapy Treatment Patient Details Name: Gina [name] MRN: [id number] DOB: 04-27-1966 Today's Date: 02/13/2017    History of Present Illness Pt admitted from [facility] with dx of sepsis, CAD, UTI, R leg ulcer and hx of DM, chronic Pain, Asthma, and Fibromyalgia    PT Comments    Pt cooperative and demonstrating marked improvement in activity tolerance this am.   Follow Up Recommendations  Home health PT     Equipment Recommendations  3in1 (PT)    Recommendations for Other Services OT consult     Precautions / Restrictions Precautions Precautions: Fall Restrictions Weight Bearing Restrictions: No    Mobility  Bed Mobility               General bed mobility comments: Pt OOB with nursing  Transfers Overall transfer level: Needs assistance Equipment used: Rolling walker (2 wheeled) Transfers: Sit to/from Stand Sit to Stand: Min assist         General transfer comment: cues for use of UEs to self assist  Ambulation/Gait Ambulation/Gait assistance: Min assist Ambulation Distance (Feet): 75 Feet (twice) Assistive device: Rolling walker (2 wheeled) Gait Pattern/deviations: Step-through pattern;Decreased step length - right;Decreased step length - left;Shuffle Gait velocity: decr Gait velocity interpretation: Below normal speed for age/gender General Gait Details: cues for posture and position from RW.  Increased time and ultiple short standing rests to complete task   Stairs            Wheelchair Mobility    Modified Rankin (Stroke Patients Only)       Balance Overall balance assessment: Needs assistance Sitting-balance support: Feet supported;No upper extremity supported Sitting balance-Leahy Scale: Good     Standing balance support: Bilateral upper extremity supported Standing balance-Leahy Scale: Poor                              Cognition Arousal/Alertness: Awake/alert Behavior During Therapy: WFL for  tasks assessed/performed Overall Cognitive Status: Within Functional Limits for tasks assessed                                        Exercises      General Comments        Pertinent Vitals/Pain Pain Assessment: 0-10 Pain Score: 4  Pain Location: abdomen Pain Descriptors / Indicators: Sore Pain Intervention(s): Limited activity within patient's tolerance;Monitored during session;Ice applied;Premedicated before session    Home Living                      Prior Function            PT Goals (current goals can now be found in the care plan section) Acute Rehab PT Goals Patient Stated Goal: Regain IND and return home PT Goal Formulation: With patient Time For Goal Achievement: 02/26/17 Potential to Achieve Goals: Good Progress towards PT goals: Progressing toward goals    Frequency    Min 3X/week      PT Plan Current plan remains appropriate    Co-evaluation              AM-PAC PT "6 Clicks" Daily Activity  Outcome Measure  Difficulty turning over in bed (including adjusting bedclothes, sheets and blankets)?: A Lot Difficulty moving from lying on back to sitting on the side of the bed? : Total Difficulty sitting down on and  standing up from a chair with arms (e.g., wheelchair, bedside commode, etc,.)?: Total Help needed moving to and from a bed to chair (including a wheelchair)?: A Lot Help needed walking in hospital room?: A Little Help needed climbing 3-5 steps with a railing? : A Lot 6 Click Score: 11    End of Session Equipment Utilized During Treatment: Gait belt Activity Tolerance: Patient tolerated treatment well;Patient limited by fatigue Patient left: in chair;with call bell/phone within reach;with nursing/sitter in room Nurse Communication: Mobility status PT Visit Diagnosis: Muscle weakness (generalized) (M62.81);Difficulty in walking, not elsewhere classified (R26.2)     Time: 3833-3832 PT Time Calculation (min)  (ACUTE ONLY): 23 min  Charges:  $Gait Training: 23-37 mins                    G Codes:       Pg [phone]    Thos Matsumoto 02/13/2017, 12:34 PM

## 2017-02-13 NOTE — Progress Notes (Signed)
PROGRESS NOTE    Gina [name]  TOI:[id number] DOB: 22-Sep-1965 DOA: 02/11/2017 PCP: System, Pcp Not In    Brief Narrative:  51 year old female who presented with bacteremia. She is known to have obesity, intermittent asthma, hypertension, chronic pain, diabetes mellitus, insulin-dependent. He was transferred from [facility] after being diagnosed with sepsis due to Streptococcus dysgalactiae bacteremia. The source of infection was presumed to be community-acquired pneumonia, urinary tract infection, abdominal wall cellulitis. Apparently she received appropriate therapy for sepsis, her hospitalization was complicated by volume overload and metabolic encephalopathy. On initial physical examination blood pressure 163/57, heart rate 86, respiratory rate 16, temperature 97.8, oxygen saturation 96%. Mucous membranes moist, lungs were clear to auscultation bilaterally, heart S1-S2 present and rhythmic, the abdomen had diffuse tenderness, with no peritoneal signs, no lower extremity edema. Positive 2 cm ulcer anterior left shin with thick white, yellow exudate. Sodium 142, potassium 3.9, chloride 104, bicarbonate 32, glucose 159, BUN 10, creatinine 0.73, white count 6.3, hemoglobin 8.9, hematocrit 27.6, platelets 186 urinalysis negative for infection.  Patient was admitted to the hospital with Streptococcus bacteremia.   Assessment & Plan:   Principal Problem:   Bacteremia due to group B Streptococcus Active Problems:   CAP (community acquired pneumonia)   UTI (urinary tract infection)   Insulin-requiring or dependent type II diabetes mellitus (HCC)   Hypertension   Asthma   Volume overload   Acute encephalopathy   Right renal mass   Normocytic anemia   1. Streptococcal bacteremia. Tolerating IV ceftriaxone,plan to continue through July 13th. Wound care to pannus and left lower extremity. Will need home health services.   2. Community acquired pneumonia. Oxygen saturation 98 on 3  LPM per [place], will check overnight oxymetry on room air. Hold on diuresis for now.   3. Abdominal panniculitis and left leg ulcer. Continue wound care and IV antibiotics, out of bed as tolerated.   4. T2DM. Capillary glucose 166, 181, 195, 169. Continue glucose cover and monitoring with insulin sliding scale.   7. HTN. Systolic blood pressure, 833 to 120, on lisinopril and HCTZ.   Will plan to discharge home in am, if continue hemodynamically stable and improving.   DVT prophylaxis: enoxaparin  Code Status: full  Family Communication:  Disposition Plan:   Consultants:     Procedures:    Antimicrobials: Ceftriaxone    Subjective: Patient feeling better, no nausea or vomiting, persistent abdominal pain, no chest pain or dyspnea. Decreaseds mobility due to generalized weakness.   Objective: Vitals:   02/12/17 1300 02/12/17 1611 02/12/17 2108 02/13/17 0523  BP: (!) 114/55 (!) 127/51 (!) 117/58 124/62  Pulse: 77 77 65 82  Resp: (!) 23 20 20 20   Temp:  (!) 97.4 F (36.3 C) 98.2 F (36.8 C) 97.6 F (36.4 C)  TempSrc:  Oral Oral Oral  SpO2: 95% 99% 98% 98%  Weight:      Height:        Intake/Output Summary (Last 24 hours) at 02/13/17 1024 Last data filed at 02/13/17 0730  Gross per 24 hour  Intake              480 ml  Output              600 ml  Net             -120 ml   Filed Weights   02/11/17 1820  Weight: (!) 161.9 kg (357 lb)    Examination:  General exam: deconditioned E ENT: mild  pallor, no icterus, oral mucosa moist.  Respiratory system: Clear to auscultation. Respiratory effort normal. Mild decreased breath sounds at bases, no wheezing, rales or rhonchi.  Cardiovascular system: S1 & S2 heard, RRR. No JVD, murmurs, rubs, gallops or clicks. No pedal edema. Gastrointestinal system: Abdomen is protuberant, nondistended, soft and nontender. No organomegaly or masses felt. Normal bowel sounds heard. Central nervous system: Alert and oriented. No  focal neurological deficits. Extremities: Symmetric 5 x 5 power. Skin: positive erythema at the abdominal wall, lower quadrants, tender to palpation, pitting edema, positive blisters, no pus.    Data Reviewed: I have personally reviewed following labs and imaging studies  CBC:  Recent Labs Lab 02/12/17 0333 02/13/17 0500  WBC 6.3 5.1  NEUTROABS 4.3 3.3  HGB 8.9* 8.7*  HCT 27.6* 28.1*  MCV 88.5 90.6  PLT 186 093   Basic Metabolic Panel:  Recent Labs Lab 02/12/17 0333 02/13/17 0500  NA 142 140  K 3.9 4.5  CL 104 101  CO2 32 34*  GLUCOSE 159* 171*  BUN 10 11  CREATININE 0.73 0.92  CALCIUM 8.5* 8.4*   GFR: Estimated Creatinine Clearance: 110.2 mL/min (by C-G formula based on SCr of 0.92 mg/dL). Liver Function Tests:  Recent Labs Lab 02/12/17 0333  AST 31  ALT 30  ALKPHOS 74  BILITOT 0.4  PROT 5.6*  ALBUMIN 2.3*   No results for input(s): LIPASE, AMYLASE in the last 168 hours. No results for input(s): AMMONIA in the last 168 hours. Coagulation Profile: No results for input(s): INR, PROTIME in the last 168 hours. Cardiac Enzymes: No results for input(s): CKTOTAL, CKMB, CKMBINDEX, TROPONINI in the last 168 hours. BNP (last 3 results) No results for input(s): PROBNP in the last 8760 hours. HbA1C:  Recent Labs  02/11/17 2006  HGBA1C 6.4*   CBG:  Recent Labs Lab 02/12/17 0747 02/12/17 1156 02/12/17 1703 02/12/17 2101 02/13/17 0721  GLUCAP 165* 201* 166* 181* 195*   Lipid Profile: No results for input(s): CHOL, HDL, LDLCALC, TRIG, CHOLHDL, LDLDIRECT in the last 72 hours. Thyroid Function Tests: No results for input(s): TSH, T4TOTAL, FREET4, T3FREE, THYROIDAB in the last 72 hours. Anemia Panel: No results for input(s): VITAMINB12, FOLATE, FERRITIN, TIBC, IRON, RETICCTPCT in the last 72 hours. Sepsis Labs: No results for input(s): PROCALCITON, LATICACIDVEN in the last 168 hours.  Recent Results (from the past 240 hour(s))  MRSA PCR Screening      Status: None   Collection Time: 02/11/17 10:49 PM  Result Value Ref Range Status   MRSA by PCR NEGATIVE NEGATIVE Final    Comment:        The GeneXpert MRSA Assay (FDA approved for NASAL specimens only), is one component of a comprehensive MRSA colonization surveillance program. It is not intended to diagnose MRSA infection nor to guide or monitor treatment for MRSA infections.          Radiology Studies: No results found.      Scheduled Meds: . atorvastatin  20 mg Oral q1800  . Chlorhexidine Gluconate Cloth  6 each Topical Daily  . heparin  5,000 Units Subcutaneous Q8H  . lisinopril  20 mg Oral Daily   And  . hydrochlorothiazide  25 mg Oral Daily  . insulin aspart  0-20 Units Subcutaneous TID WC  . insulin aspart  0-5 Units Subcutaneous QHS  . insulin aspart  6 Units Subcutaneous TID WC  . miconazole nitrate   Topical TID  . pantoprazole  40 mg Oral Daily  . pregabalin  200 mg Oral BID  . sodium chloride flush  10-40 mL Intracatheter Q12H  . sodium chloride flush  3 mL Intravenous Q12H  . sodium chloride flush  3 mL Intravenous Q12H   Continuous Infusions: . sodium chloride    . cefTRIAXone (ROCEPHIN)  IV 2 g (02/13/17 0917)     LOS: 2 days       Urie Loughner Gerome Apley, MD [facility] Pager [phone]  If 7PM-7AM, please contact night-coverage [email or web address] Password [email or web address] 02/13/2017, 10:24 AM

## 2017-02-13 NOTE — Progress Notes (Signed)
Overnight pulse ox set up on RA per MD order. Alarm settings in place. Will continue to monitor

## 2017-02-14 ENCOUNTER — Inpatient Hospital Stay (HOSPITAL_COMMUNITY): Payer: Medicare Other

## 2017-02-14 DIAGNOSIS — N3000 Acute cystitis without hematuria: Secondary | ICD-10-CM

## 2017-02-14 LAB — GLUCOSE, CAPILLARY
GLUCOSE-CAPILLARY: 159 mg/dL — AB (ref 65–99)
GLUCOSE-CAPILLARY: 132 mg/dL — AB (ref 65–99)
GLUCOSE-CAPILLARY: 153 mg/dL — AB (ref 65–99)
GLUCOSE-CAPILLARY: 190 mg/dL — AB (ref 65–99)

## 2017-02-14 NOTE — Progress Notes (Signed)
Overnight Pulse Ox study completed on room air per MD orders.  Pt placed back on 3 LPM O2 after study terminated due to sats in the mid 80's.  Pt alert and oriented sitting in recliner at this time.  Report downloaded and placed in chart.  RT to monitor and assess as needed.

## 2017-02-14 NOTE — Progress Notes (Signed)
Patient assisted to [facility] and desats to 80% on room air. Remains on sleep study. Patient requested to sit in recliner for a bit so will monitor sats while sitting up. Will contact RT to discuss further since patient is desatting to low 80s and RN feels patient should have O2 with these numbers. Will c/t monitor.

## 2017-02-14 NOTE — Progress Notes (Signed)
PROGRESS NOTE    Gina [name]  RSW:[id number] DOB: 10/06/1965 DOA: 02/11/2017 PCP: System, Pcp Not In     Brief Narrative:  51 year old female who presented with bacteremia. She is known to have obesity, intermittent asthma, hypertension, chronic pain, diabetes mellitus, insulin-dependent. He was transferred from [facility] after being diagnosed with sepsis due to Streptococcus dysgalactiaebacteremia. The source of infection was presumed to be community-acquired pneumonia, urinary tract infection, abdominal wall cellulitis. Apparently she received appropriate therapy for sepsis,her hospitalization was complicated by volume overload and metabolic encephalopathy. On initial physical examination blood pressure 163/57, heart rate 86, respiratory rate 16, temperature 97.8, oxygen saturation 96%. Mucous membranes moist, lungs were clear to auscultation bilaterally, heart S1-S2 present and rhythmic, the abdomen had diffuse tenderness, with no peritoneal signs, no lower extremity edema. Positive 2 cmulcer anterior left shin with thick white, yellow exudate. Sodium 142, potassium 3.9, chloride 104, bicarbonate 32, glucose 159, BUN 10, creatinine 0.73, white count 6.3, hemoglobin 8.9, hematocrit 27.6, platelets 186 urinalysis negative for infection.  Patient was admitted to the hospital with Streptococcus bacteremia.   Assessment & Plan:   Principal Problem:   Bacteremia due to group B Streptococcus Active Problems:   CAP (community acquired pneumonia)   UTI (urinary tract infection)   Insulin-requiring or dependent type II diabetes mellitus (HCC)   Hypertension   Asthma   Volume overload   Acute encephalopathy   Right renal mass   Normocytic anemia   1. Streptococcal bacteremia. Continue IV ceftriaxone, to be completed July 13th. Rash at the abdominal pannicular region is improved.   2. Community acquired pneumonia. Positive desaturation to the 80's on room air, persistent  dyspnea and lower extremity edema. Will continue incentive spirometer and will follow 2 view chest film.   3. Abdominal panniculitis and left leg ulcer. Rash clinically has improved will continue with local wound care and IV antibiotics, out of bed as tolerated. Will need home health for post hospitalization follow up.   4. T2DM. Capillary glucose 134, 139,, 159, 132. Tolerating po well, will continue insulin sliding scale for glucose cover and monitoring.   7. HTN. Blood pressure control with  lisinopril and HCTZ. Hold on further diuresis for now.   Will plan to discharge home in am, if continue hemodynamically stable and improving.   DVT prophylaxis:enoxaparin  Code Status:full  Family Communication: Disposition Plan:   Consultants:    Procedures:   Antimicrobials: Ceftriaxone    Subjective: Patient had positive desaturation and dyspnea last night off supplemental 02, no nausea or vomiting. Persistent abdominal pain, worse with movement and touch.   Objective: Vitals:   02/13/17 1357 02/13/17 2048 02/14/17 0604 02/14/17 0820  BP: (!) 124/58 107/60 113/64   Pulse: 77 77 77   Resp: 18 19 18    Temp: 98.4 F (36.9 C) 98.6 F (37 C) 97.9 F (36.6 C)   TempSrc: Oral Oral Oral   SpO2: 94% 94% (!) 89%   Weight:    (!) 157.1 kg (346 lb 6.4 oz)  Height:        Intake/Output Summary (Last 24 hours) at 02/14/17 1324 Last data filed at 02/14/17 0801  Gross per 24 hour  Intake              340 ml  Output                0 ml  Net              340 ml  Filed Weights   02/11/17 1820 02/14/17 0820  Weight: (!) 161.9 kg (357 lb) (!) 157.1 kg (346 lb 6.4 oz)    Examination:  General exam: deconditioned and ill looking appearing E ENT: no pallor or icterus, oral mucosa moist.  Respiratory system: Decreased breath sounds at bases, no wheezing, rales or rhonchi. Respiratory effort normal. Cardiovascular system: S1 & S2 heard, RRR. No JVD, murmurs, rubs,  gallops or clicks. No pedal edema. Gastrointestinal system: Abdomen is nondistended, soft and nontender. No organomegaly or masses felt. Normal bowel sounds heard. Central nervous system: Alert and oriented. No focal neurological deficits. Extremities: Symmetric 5 x 5 power. Skin: improved    Data Reviewed: I have personally reviewed following labs and imaging studies  CBC:  Recent Labs Lab 02/12/17 0333 02/13/17 0500  WBC 6.3 5.1  NEUTROABS 4.3 3.3  HGB 8.9* 8.7*  HCT 27.6* 28.1*  MCV 88.5 90.6  PLT 186 397   Basic Metabolic Panel:  Recent Labs Lab 02/12/17 0333 02/13/17 0500  NA 142 140  K 3.9 4.5  CL 104 101  CO2 32 34*  GLUCOSE 159* 171*  BUN 10 11  CREATININE 0.73 0.92  CALCIUM 8.5* 8.4*   GFR: Estimated Creatinine Clearance: 108.1 mL/min (by C-G formula based on SCr of 0.92 mg/dL). Liver Function Tests:  Recent Labs Lab 02/12/17 0333  AST 31  ALT 30  ALKPHOS 74  BILITOT 0.4  PROT 5.6*  ALBUMIN 2.3*   No results for input(s): LIPASE, AMYLASE in the last 168 hours. No results for input(s): AMMONIA in the last 168 hours. Coagulation Profile: No results for input(s): INR, PROTIME in the last 168 hours. Cardiac Enzymes: No results for input(s): CKTOTAL, CKMB, CKMBINDEX, TROPONINI in the last 168 hours. BNP (last 3 results) No results for input(s): PROBNP in the last 8760 hours. HbA1C:  Recent Labs  02/11/17 2006  HGBA1C 6.4*   CBG:  Recent Labs Lab 02/13/17 1121 02/13/17 1630 02/13/17 2041 02/14/17 0720 02/14/17 1122  GLUCAP 169* 134* 138* 159* 132*   Lipid Profile: No results for input(s): CHOL, HDL, LDLCALC, TRIG, CHOLHDL, LDLDIRECT in the last 72 hours. Thyroid Function Tests: No results for input(s): TSH, T4TOTAL, FREET4, T3FREE, THYROIDAB in the last 72 hours. Anemia Panel: No results for input(s): VITAMINB12, FOLATE, FERRITIN, TIBC, IRON, RETICCTPCT in the last 72 hours. Sepsis Labs: No results for input(s): PROCALCITON,  LATICACIDVEN in the last 168 hours.  Recent Results (from the past 240 hour(s))  MRSA PCR Screening     Status: None   Collection Time: 02/11/17 10:49 PM  Result Value Ref Range Status   MRSA by PCR NEGATIVE NEGATIVE Final    Comment:        The GeneXpert MRSA Assay (FDA approved for NASAL specimens only), is one component of a comprehensive MRSA colonization surveillance program. It is not intended to diagnose MRSA infection nor to guide or monitor treatment for MRSA infections.          Radiology Studies: No results found.      Scheduled Meds: . atorvastatin  20 mg Oral q1800  . Chlorhexidine Gluconate Cloth  6 each Topical Daily  . heparin  5,000 Units Subcutaneous Q8H  . lisinopril  20 mg Oral Daily   And  . hydrochlorothiazide  25 mg Oral Daily  . insulin aspart  0-20 Units Subcutaneous TID WC  . insulin aspart  0-5 Units Subcutaneous QHS  . insulin aspart  6 Units Subcutaneous TID WC  . miconazole nitrate  Topical TID  . pantoprazole  40 mg Oral Daily  . pregabalin  200 mg Oral BID  . sodium chloride flush  10-40 mL Intracatheter Q12H  . sodium chloride flush  3 mL Intravenous Q12H  . sodium chloride flush  3 mL Intravenous Q12H   Continuous Infusions: . sodium chloride    . cefTRIAXone (ROCEPHIN)  IV Stopped (02/14/17 1106)     LOS: 3 days        Acacia Latorre Gerome Apley, MD [facility] Pager [phone]  If 7PM-7AM, please contact night-coverage [email or web address] Password [email or web address] 02/14/2017, 1:24 PM

## 2017-02-14 NOTE — Progress Notes (Signed)
Patient continues to desat in low 80s on room air. Overnight pulse ox study continues to be in place so patient is on room air but sats to 80% when asleep. RT on floor and discussed the patient sat levels and advised if there is a level in which patient should be placed back on O2. RT states since testing is for RA, pt should be on room air. RN expressed concern for the low sats but will continue to monitor.

## 2017-02-14 NOTE — Progress Notes (Signed)
Patient on overnight pulse ox and now that she has fully gotten to sleep is desatting to the low 80s, 81% at the lowest. Patient instructed to take deep breaths and does comes back up to 89%. Will c/t monitor.

## 2017-02-15 DIAGNOSIS — N2889 Other specified disorders of kidney and ureter: Secondary | ICD-10-CM

## 2017-02-15 DIAGNOSIS — J4520 Mild intermittent asthma, uncomplicated: Secondary | ICD-10-CM

## 2017-02-15 LAB — GLUCOSE, CAPILLARY
Glucose-Capillary: 173 mg/dL — ABNORMAL HIGH (ref 65–99)
Glucose-Capillary: 147 mg/dL — ABNORMAL HIGH (ref 65–99)

## 2017-02-15 MED ORDER — BLOOD GLUCOSE METER KIT: PACK | 0 refills | Status: DC

## 2017-02-15 MED ORDER — HEPARIN SOD (PORK) LOCK FLUSH 100 UNIT/ML IV SOLN
250.0000 [IU] | INTRAVENOUS | Status: AC | PRN
  Administered 2017-02-15: 250 [IU]

## 2017-02-15 MED ORDER — CEFTRIAXONE IV (FOR PTA / DISCHARGE USE ONLY): 2.0000 g | INTRAVENOUS | 0 refills | Status: AC

## 2017-02-15 MED ORDER — IPRATROPIUM-ALBUTEROL 0.5-2.5 (3) MG/3ML IN SOLN: 3.0000 mL | Freq: Four times a day (QID) | RESPIRATORY_TRACT | 2 refills | Status: DC | PRN

## 2017-02-15 NOTE — Progress Notes (Addendum)
SATURATION QUALIFICATIONS: (This note is used to comply with regulatory documentation for home oxygen)  Patient Saturations on Room Air at Rest =91  Patient Saturations on Room Air while Ambulating =88  Patient Saturations on Liters of oxygen while Ambulating = 91  Please briefly explain why patient needs home oxygen:  Alternate methods/txs tried and were not proven sucessful  Patient placed on 2L after ambulating

## 2017-02-15 NOTE — Progress Notes (Signed)
Physical Therapy Treatment Patient Details Name: DANILYNN JEMISON MRN: [id number] DOB: December 02, 1965 Today's Date: 02/15/2017    History of Present Illness Pt admitted from [facility] with dx of sepsis, CAD, UTI, R leg ulcer and hx of DM, chronic Pain, Asthma, and Fibromyalgia    PT Comments    Pt in bathroom on arrival, stated she had just amb "to the window and back".  C/O fatigue.  Assisted off bari commode and assisted with hygiene.  Assisted amb pt to recliner with 25% VC's on proper walker to self distance and turn completion prior to sit as pt sat quickly due to fatigue.  Pt plans to D/C back home with spouse today.    Follow Up Recommendations  Home health PT     Equipment Recommendations  3in1 (PT)    Recommendations for Other Services       Precautions / Restrictions Precautions Precautions: Fall Restrictions Weight Bearing Restrictions: No    Mobility  Bed Mobility               General bed mobility comments: Pt OOB  Transfers Overall transfer level: Needs assistance Equipment used: Rolling walker (2 wheeled) Transfers: Sit to/from [facility] Sit to Stand: Supervision Stand pivot transfers: Supervision       General transfer comment: assisted ogg toilet and to recliner.  Good safety awarness and use of hands to steady self.    Ambulation/Gait Ambulation/Gait assistance: Supervision Ambulation Distance (Feet): 8 Feet Assistive device: Rolling walker (2 wheeled) Gait Pattern/deviations: Step-to pattern;Step-through pattern;Wide base of support;Shuffle Gait velocity: decreased   General Gait Details: only amb from bathroom to recliner due to fatigue from toileting.     Stairs            Wheelchair Mobility    Modified Rankin (Stroke Patients Only)       Balance                                            Cognition Arousal/Alertness: Awake/alert Behavior During Therapy: WFL for tasks  assessed/performed Overall Cognitive Status: Within Functional Limits for tasks assessed                                        Exercises      General Comments        Pertinent Vitals/Pain Pain Assessment: No/denies pain    Home Living                      Prior Function            PT Goals (current goals can now be found in the care plan section) Progress towards PT goals: Progressing toward goals    Frequency    Min 3X/week      PT Plan Current plan remains appropriate    Co-evaluation              AM-PAC PT "6 Clicks" Daily Activity  Outcome Measure  Difficulty turning over in bed (including adjusting bedclothes, sheets and blankets)?: Total Difficulty moving from lying on back to sitting on the side of the bed? : Total Difficulty sitting down on and standing up from a chair with arms (e.g., wheelchair, bedside commode, etc,.)?: Total Help needed moving to and  from a bed to chair (including a wheelchair)?: A Lot   Help needed climbing 3-5 steps with a railing? : Total 6 Click Score: 6    End of Session Equipment Utilized During Treatment: Gait belt Activity Tolerance: Patient limited by fatigue Patient left: in chair;with call bell/phone within reach Nurse Communication: Mobility status PT Visit Diagnosis: Muscle weakness (generalized) (M62.81);Difficulty in walking, not elsewhere classified (R26.2)     Time: 8984-2103 PT Time Calculation (min) (ACUTE ONLY): 9 min  Charges:  $Gait Training: 8-22 mins                    G Codes:       Rica Koyanagi  PTA [facility]  Acute  Rehab Pager      [phone]

## 2017-02-15 NOTE — Discharge Summary (Addendum)
Physician Discharge Summary  Gina [name] ECX:[id number] DOB: 1966-01-24 DOA: 02/11/2017  PCP: System, Pcp Not In  Admit date: 02/11/2017 Discharge date: 02/15/2017  Admitted From:  Home  Disposition:  Home   Recommendations for Outpatient Follow-up:  1. Follow up with PCP in 1- weeks 2. Patient will continue antibiotic therapy with IV ceftriaxone daily until 02/19/2017 3. Follow ESR and CRP every other week 4. Please remove PICC line after last dose of antibiotic therapy on 02/19/2017 5. Patient will need an open MRI study to study a incidental exophytic right renal mass.  Home Health: Yes  Equipment/Devices: Home 02, continuous   Discharge Condition: Stable CODE STATUS: Full  Diet recommendation: Heart Healthy / Carb Modified     Brief/Interim Summary: 51 year old female who presented with bacteremia. She is known to have obesity, intermittent asthma, hypertension, chronic pain, diabetes mellitus type 2, insulin-dependent. He was transferred from [facility] after being diagnosed with sepsis due to Streptococcus dysgalactiaebacteremia. The source of infection was presumed to be community-acquired pneumonia, urinary tract infection, abdominal wall cellulitis. Apparently she received appropriate therapy for sepsis,her hospitalization was complicated by volume overload and metabolic encephalopathy. On initial physical examination blood pressure 163/57, heart rate 86, respiratory rate 16, temperature 97.8, oxygen saturation 96%. Mucous membranes moist, lungs were clear to auscultation bilaterally, heart S1-S2 present and rhythmic, the abdomen had diffuse tenderness, with no peritoneal signs, no lower extremity edema. Positive 2 cmulcer anterior left shin with thick white, yellow exudate. Positive erythema at the pannicular area and lower abdomen, blisters and ulcers present. Sodium 142, potassium 3.9, chloride 104, bicarbonate 32, glucose 159, BUN 10, creatinine 0.73, white count 6.3,  hemoglobin 8.9, hematocrit 27.6, platelets 186 urinalysis negative for infection.  Patient was admitted to the hospital with Streptococcus bacteremia.  1. Severe sepsis due to Streptococcal bacteremia. Likely source panniculitis on the lower abdomen. Patient was continued on intravenous ceftriaxone, patient remained afebrile, no significant leukocytosis. Patient received wound care to her panniculitis.   2. Community-acquired pneumonia. Follow-up chest film shows significant improvement of pulmonary infiltrates, she was placed on supplemental oxygen. nasal cannula and bronchodilator therapy.  3. Asthma and likely obstructive sleep apnea. Continue bronchodilator therapy, patient had an overnight oximetry which was positive for desaturation, patient will be discharged home with supplemental home oxygen, patient will need a formal sleep study.  4. Type 2 diabetes mellitus. She was placed on insulin sliding scale for glucose coverage and monitoring, capillary glucose remained in adequate range.  5. Hypertension. Blood pressure remained well controlled with bisoprolol and hydrochlorothiazide. No further diuresis was given.   6. Deconditioning. Patient with prolonged hospital stay, she will be discharged home with home physical therapy, she will need a close follow-up as an outpatient.   7. Incidental right renal mass. Patient was found to have a exophytic mass in the right kidney, patient not able tolerate close MRI, patient will be referred as an outpatient for an open study.   8. Stage 2 to 3 left leg ulcer. Continue local wound care.  9. Anemia. Multifactorial, will need follow up as outpatient.   Discharge Diagnoses:  Principal Problem:   Bacteremia due to group B Streptococcus Active Problems:   CAP (community acquired pneumonia)   UTI (urinary tract infection)   Insulin-requiring or dependent type II diabetes mellitus (HCC)   Hypertension   Asthma   Volume overload   Acute  encephalopathy   Right renal mass   Normocytic anemia    Discharge Instructions   Allergies  as of 02/15/2017      Reactions   Darvocet [propoxyphene N-acetaminophen] Nausea And Vomiting   Morphine And Related Itching   Penicillins Hives, Swelling   Tolerates Rocephin Swelling all over body  Has patient had a PCN reaction causing immediate rash, facial/tongue/throat swelling, SOB or lightheadedness with hypotension: Yes Has patient had a PCN reaction causing severe rash involving mucus membranes or skin necrosis: Yes Has patient had a PCN reaction that required hospitalization Unknown Has patient had a PCN reaction occurring within the last 10 years: No  If all of the above answers are "NO", then may proceed with Cephalosporin use.   Tape Hives   Adhesive tape      Medication List    TAKE these medications   albuterol (2.5 MG/3ML) 0.083% nebulizer solution Commonly known as:  PROVENTIL Take 2.5 mg by nebulization every 6 (six) hours as needed for wheezing or shortness of breath.   albuterol 108 (90 Base) MCG/ACT inhaler Commonly known as:  PROVENTIL HFA;VENTOLIN HFA Inhale 2 puffs into the lungs every 6 (six) hours as needed for wheezing or shortness of breath.   ALPRAZolam 1 MG tablet Commonly known as:  XANAX Take 1 mg by mouth 2 (two) times daily as needed for anxiety.   atorvastatin 20 MG tablet Commonly known as:  LIPITOR Take 20 mg by mouth daily.   blood glucose meter kit and supplies Dispense based on patient and insurance preference. Use up to four times daily as directed. (FOR ICD-9 250.00, 250.01).   butalbital-acetaminophen-caffeine 50-325-40 MG tablet Commonly known as:  FIORICET, ESGIC Take 1 tablet by mouth every 6 (six) hours as needed for headache or migraine.   cefTRIAXone IVPB Commonly known as:  ROCEPHIN Inject 2 g into the vein daily. Indication:  bacteremia Last Day of Therapy:  02/19/17 Labs - Once weekly:  CBC/D and BMP, Labs - Every other  week:  ESR and CRP Please remove Picc line after last dose on 02/19/2017   cyclobenzaprine 10 MG tablet Commonly known as:  FLEXERIL Take 10 mg by mouth 3 (three) times daily as needed for muscle spasms.   fluticasone 50 MCG/ACT nasal spray Commonly known as:  FLONASE Place 1 spray into both nostrils daily.   HUMULIN 70/30 PEN [place] Inject 30 Units into the skin 3 (three) times daily as needed (high blood sugar).   ipratropium-albuterol 0.5-2.5 (3) MG/3ML Soln Commonly known as:  DUONEB Take 3 mLs by nebulization every 6 (six) hours as needed.   lisinopril-hydrochlorothiazide 20-25 MG tablet Commonly known as:  PRINZIDE,ZESTORETIC Take 1 tablet by mouth daily.   metFORMIN 500 MG tablet Commonly known as:  GLUCOPHAGE Take 500 mg by mouth 2 (two) times daily with a meal.   omeprazole 20 MG capsule Commonly known as:  PRILOSEC Take 20 mg by mouth daily with breakfast.   phentermine 37.5 MG tablet Commonly known as:  ADIPEX-P Take 37.5 mg by mouth daily before breakfast.   pregabalin 200 MG capsule Commonly known as:  LYRICA Take 200 mg by mouth 2 (two) times daily.   promethazine 25 MG tablet Commonly known as:  PHENERGAN Take 1 tablet (25 mg total) by mouth every 8 (eight) hours as needed. What changed:  reasons to take this            Home Infusion Instuctions        Start     Ordered   02/15/17 0000  Home infusion instructions [facility] May follow [place]  Protocol; May administer Cathflo as needed to maintain patency of vascular access device.; Flushing of vascular access device: per [facility] Protocol: 0.9% NaCl pre/post medica...    Question Answer Comment  Instructions May follow [place] Dosing Protocol   Instructions May administer Cathflo as needed to maintain patency of vascular access device.   Instructions Flushing of vascular access device: per [facility] Protocol: 0.9% NaCl pre/post medication administration and prn patency; Heparin 100  u/ml, 74m for implanted ports and Heparin 10u/ml, 565mfor all other central venous catheters.   Instructions May follow [facility] Anaphylaxis Protocol for First Dose Administration in the home: 0.9% NaCl at 25-50 ml/hr to maintain IV access for protocol meds. Epinephrine 0.3 ml IV/IM PRN and Benadryl 25-50 IV/IM PRN s/s of anaphylaxis.   Instructions [facility] Infusion Coordinator (RN) to assist per patient IV care needs in the home PRN.      02/15/17 [place]      Start     Ordered   02/15/17 1142  For home use only DME 3 n 1  Once    Comments:  bariatric   02/15/17 1146      Allergies  Allergen Reactions  . Darvocet [Propoxyphene N-Acetaminophen] Nausea And Vomiting  . Morphine And Related Itching  . Penicillins Hives and Swelling    Tolerates Rocephin  Swelling all over body  Has patient had a PCN reaction causing immediate rash, facial/tongue/throat swelling, SOB or lightheadedness with hypotension: Yes Has patient had a PCN reaction causing severe rash involving mucus membranes or skin necrosis: Yes Has patient had a PCN reaction that required hospitalization Unknown Has patient had a PCN reaction occurring within the last 10 years: No  If all of the above answers are "NO", then may proceed with Cephalosporin use.   . Tape Hives    Adhesive tape    Consultations:   Procedures/Studies: Dg Chest 2 View  Result Date: 02/14/2017 CLINICAL DATA:  UTI with pneumonia.  Abdominal wall cellulitis. EXAM: CHEST  2 VIEW COMPARISON:  06/25/2016 FINDINGS: The cardio pericardial silhouette is enlarged. There is pulmonary vascular congestion without overt pulmonary edema. Probable atelectasis at the left base. Right PICC line tip overlies the mid SVC level. The visualized bony structures of the thorax are intact. IMPRESSION: Vascular congestion with left base atelectasis. Electronically Signed   By: ErMisty Stanley.D.   On: 02/14/2017 14:57       Subjective: Patient is feeling better, no nausea or vomiting, tolerating po well, dyspnea at baseline, controlled pain at the abdominal wall.   Discharge Exam: Vitals:   02/14/17 2008 02/15/17 0510  BP: 124/60 (!) 118/51  Pulse: 78 76  Resp: 20 20  Temp: 98 F (36.7 C) 97.9 F (36.6 C)   Vitals:   02/14/17 0820 02/14/17 1335 02/14/17 2008 02/15/17 0510  BP:  124/61 124/60 (!) 118/51  Pulse:  78 78 76  Resp:  '18 20 20  ' Temp:  (!) 97.5 F (36.4 C) 98 F (36.7 C) 97.9 F (36.6 C)  TempSrc:  Oral Oral Oral  SpO2:  98% 98% 95%  Weight: (!) 157.1 kg (346 lb 6.4 oz)     Height:        General: Pt is alert, awake, not in acute distress E ENT: no pallor or icterus, oral mucosa moist.  Cardiovascular: RRR, S1/S2 +, no rubs, no gallops Respiratory: CTA bilaterally, no wheezing, no rhonchi Abdominal: Soft, NT, ND,  bowel sounds + Extremities: no edema, no cyanosis Skin with erythema at the pannus at the lower abdomen.     The results of significant diagnostics from this hospitalization (including imaging, microbiology, ancillary and laboratory) are listed below for reference.     Microbiology: Recent Results (from the past 240 hour(s))  MRSA PCR Screening     Status: None   Collection Time: 02/11/17 10:49 PM  Result Value Ref Range Status   MRSA by PCR NEGATIVE NEGATIVE Final    Comment:        The GeneXpert MRSA Assay (FDA approved for NASAL specimens only), is one component of a comprehensive MRSA colonization surveillance program. It is not intended to diagnose MRSA infection nor to guide or monitor treatment for MRSA infections.      Labs: BNP (last 3 results) No results for input(s): BNP in the last 8760 hours. Basic Metabolic Panel:  Recent Labs Lab 02/12/17 0333 02/13/17 0500  NA 142 140  K 3.9 4.5  CL 104 101  CO2 32 34*  GLUCOSE 159* 171*  BUN 10 11  CREATININE 0.73 0.92  CALCIUM 8.5* 8.4*   Liver Function Tests:  Recent Labs Lab  02/12/17 0333  AST 31  ALT 30  ALKPHOS 74  BILITOT 0.4  PROT 5.6*  ALBUMIN 2.3*   No results for input(s): LIPASE, AMYLASE in the last 168 hours. No results for input(s): AMMONIA in the last 168 hours. CBC:  Recent Labs Lab 02/12/17 0333 02/13/17 0500  WBC 6.3 5.1  NEUTROABS 4.3 3.3  HGB 8.9* 8.7*  HCT 27.6* 28.1*  MCV 88.5 90.6  PLT 186 180   Cardiac Enzymes: No results for input(s): CKTOTAL, CKMB, CKMBINDEX, TROPONINI in the last 168 hours. BNP: Invalid input(s): POCBNP CBG:  Recent Labs Lab 02/14/17 1122 02/14/17 1708 02/14/17 2014 02/15/17 0720 02/15/17 1138  GLUCAP 132* 153* 190* 173* 147*   D-Dimer No results for input(s): DDIMER in the last 72 hours. Hgb A1c No results for input(s): HGBA1C in the last 72 hours. Lipid Profile No results for input(s): CHOL, HDL, LDLCALC, TRIG, CHOLHDL, LDLDIRECT in the last 72 hours. Thyroid function studies No results for input(s): TSH, T4TOTAL, T3FREE, THYROIDAB in the last 72 hours.  Invalid input(s): FREET3 Anemia work up No results for input(s): VITAMINB12, FOLATE, FERRITIN, TIBC, IRON, RETICCTPCT in the last 72 hours. Urinalysis    Component Value Date/Time   COLORURINE STRAW (A) 02/11/2017 2006   APPEARANCEUR CLEAR 02/11/2017 2006   LABSPEC 1.008 02/11/2017 2006   PHURINE 5.0 02/11/2017 2006   GLUCOSEU NEGATIVE 02/11/2017 2006   HGBUR NEGATIVE 02/11/2017 2006   [place] NEGATIVE 02/11/2017 2006   KETONESUR NEGATIVE 02/11/2017 2006   PROTEINUR NEGATIVE 02/11/2017 2006   UROBILINOGEN 1.0 01/13/2012 1703   NITRITE NEGATIVE 02/11/2017 2006   LEUKOCYTESUR NEGATIVE 02/11/2017 2006   Sepsis Labs Invalid input(s): PROCALCITONIN,  WBC,  LACTICIDVEN Microbiology Recent Results (from the past 240 hour(s))  MRSA PCR Screening     Status: None   Collection Time: 02/11/17 10:49 PM  Result Value Ref Range Status   MRSA by PCR NEGATIVE NEGATIVE Final    Comment:        The GeneXpert MRSA Assay (FDA approved  for NASAL specimens only), is one component of a comprehensive MRSA colonization surveillance program. It is not intended to diagnose MRSA infection nor to guide or monitor treatment for MRSA infections.      Time coordinating discharge: 45 minutes  SIGNED:   Tawni Millers,  MD  [facility] 02/15/2017, 12:41 PM Pager   If 7PM-7AM, please contact night-coverage [email or web address] Password [email or web address]

## 2017-02-15 NOTE — Care Management Important Message (Signed)
Important Message  Patient Details  Name: Gina [name] MRN: [id number] Date of Birth: [birth year]   Medicare Important Message Given:  Yes    Kerin Salen 02/15/2017, 10:45 AMImportant Message  Patient Details  Name: Gina [name] MRN: [id number] Date of Birth: 09/30/1965   Medicare Important Message Given:  Yes    Kerin Salen 02/15/2017, 10:45 AM

## 2017-02-15 NOTE — Progress Notes (Signed)
Went over d/c instructions.  Patient left hospital via w/c with oxygen and AVS.

## 2017-02-15 NOTE — Progress Notes (Signed)
PHARMACY CONSULT NOTE FOR:  OUTPATIENT  PARENTERAL ANTIBIOTIC THERAPY (OPAT)  Indication: Strep bacteremia Regimen: Rocephin 2g IV q24 hr End date: 02/19/17  IV antibiotic discharge orders are pended. To discharging provider:  please sign these orders via discharge navigator,  Select New Orders & click on the button choice - Manage This Unsigned Work.     Thank you for allowing pharmacy to be a part of this patient's care.  Dornell Grasmick A 02/15/2017, 12:20 PM

## 2017-02-15 NOTE — Care Management Note (Signed)
Case Management Note  Patient Details  Name: LENIX BENOIST MRN: [id number] Date of Birth: 12/02/1965  Subjective/Objective:       51 yo admitted with Bacteremia.             Action/Plan: From home with family. Pt to dc home with HHPT/RN/Aide. Pt for home IV abx. Choice offered for home health and [facility] chosen. [facility] rep alerted of referral. Home 02 and bariatric 3in1 needed as well. Orders received and [facility] rep alerted of orders.   Expected Discharge Date:  02/15/17               Expected Discharge Plan:  [place]  In-House Referral:     Discharge planning Services  CM Consult  Post Acute Care Choice:  Home Health Choice offered to:  Patient  DME Arranged:  3-N-1, Oxygen DME Agency:  [place]:  RN, PT, Nurse's Aide [place] Agency:  [place]  Status of Service:  Completed, signed off  If discussed at [place] of Stay Meetings, dates discussed:    Additional CommentsLynnell Catalan, RN 02/15/2017, 3:34 PM  [phone]

## 2017-02-19 ENCOUNTER — Encounter (HOSPITAL_BASED_OUTPATIENT_CLINIC_OR_DEPARTMENT_OTHER): Payer: Medicare Other | Attending: Internal Medicine

## 2017-02-19 DIAGNOSIS — X58XXXA Exposure to other specified factors, initial encounter: Secondary | ICD-10-CM | POA: Diagnosis not present

## 2017-02-19 DIAGNOSIS — L97822 Non-pressure chronic ulcer of other part of left lower leg with fat layer exposed: Secondary | ICD-10-CM | POA: Diagnosis not present

## 2017-02-19 DIAGNOSIS — E11622 Type 2 diabetes mellitus with other skin ulcer: Secondary | ICD-10-CM | POA: Diagnosis present

## 2017-02-19 DIAGNOSIS — L03311 Cellulitis of abdominal wall: Secondary | ICD-10-CM | POA: Diagnosis not present

## 2017-02-19 DIAGNOSIS — I890 Lymphedema, not elsewhere classified: Secondary | ICD-10-CM | POA: Insufficient documentation

## 2017-02-19 DIAGNOSIS — I5020 Unspecified systolic (congestive) heart failure: Secondary | ICD-10-CM | POA: Diagnosis not present

## 2017-02-19 DIAGNOSIS — A400 Sepsis due to streptococcus, group A: Secondary | ICD-10-CM | POA: Insufficient documentation

## 2017-02-19 DIAGNOSIS — S31109A Unspecified open wound of abdominal wall, unspecified quadrant without penetration into peritoneal cavity, initial encounter: Secondary | ICD-10-CM | POA: Insufficient documentation

## 2017-02-19 DIAGNOSIS — F17210 Nicotine dependence, cigarettes, uncomplicated: Secondary | ICD-10-CM | POA: Insufficient documentation

## 2017-02-26 ENCOUNTER — Other Ambulatory Visit (HOSPITAL_COMMUNITY)
Admission: RE | Admit: 2017-02-26 | Discharge: 2017-02-26 | Disposition: A | Discharge status: Home / Self Care | Payer: Medicare Other | Source: Other Acute Inpatient Hospital | Attending: Internal Medicine | Admitting: Internal Medicine

## 2017-02-26 DIAGNOSIS — E11622 Type 2 diabetes mellitus with other skin ulcer: Secondary | ICD-10-CM | POA: Diagnosis not present

## 2017-02-26 DIAGNOSIS — R309 Painful micturition, unspecified: Secondary | ICD-10-CM | POA: Insufficient documentation

## 2017-02-26 LAB — URINALYSIS, ROUTINE W REFLEX MICROSCOPIC
Bilirubin Urine: NEGATIVE
Glucose, UA: NEGATIVE mg/dL
Hgb urine dipstick: NEGATIVE
Ketones, ur: NEGATIVE mg/dL
LEUKOCYTES UA: NEGATIVE
NITRITE: NEGATIVE
PROTEIN: NEGATIVE mg/dL
SPECIFIC GRAVITY, URINE: 1.005 (ref 1.005–1.030)
pH: 6 (ref 5.0–8.0)

## 2017-02-27 LAB — URINE CULTURE

## 2017-03-05 DIAGNOSIS — E11622 Type 2 diabetes mellitus with other skin ulcer: Secondary | ICD-10-CM | POA: Diagnosis not present

## 2017-03-12 ENCOUNTER — Encounter (HOSPITAL_BASED_OUTPATIENT_CLINIC_OR_DEPARTMENT_OTHER): Payer: Medicare Other | Attending: Internal Medicine

## 2017-03-12 DIAGNOSIS — I5020 Unspecified systolic (congestive) heart failure: Secondary | ICD-10-CM | POA: Insufficient documentation

## 2017-03-12 DIAGNOSIS — F17210 Nicotine dependence, cigarettes, uncomplicated: Secondary | ICD-10-CM | POA: Insufficient documentation

## 2017-03-12 DIAGNOSIS — L03115 Cellulitis of right lower limb: Secondary | ICD-10-CM | POA: Diagnosis not present

## 2017-03-12 DIAGNOSIS — L97829 Non-pressure chronic ulcer of other part of left lower leg with unspecified severity: Secondary | ICD-10-CM | POA: Diagnosis not present

## 2017-03-12 DIAGNOSIS — E11622 Type 2 diabetes mellitus with other skin ulcer: Secondary | ICD-10-CM | POA: Insufficient documentation

## 2017-03-19 ENCOUNTER — Encounter: Payer: Self-pay | Admitting: Family Medicine

## 2017-03-19 ENCOUNTER — Ambulatory Visit (INDEPENDENT_AMBULATORY_CARE_PROVIDER_SITE_OTHER): Payer: Medicare Other | Admitting: Family Medicine

## 2017-03-19 VITALS — BP 130/76 | HR 85 | Resp 12 | Ht 64.0 in | Wt 316.4 lb

## 2017-03-19 DIAGNOSIS — N2889 Other specified disorders of kidney and ureter: Secondary | ICD-10-CM | POA: Diagnosis not present

## 2017-03-19 DIAGNOSIS — L97929 Non-pressure chronic ulcer of unspecified part of left lower leg with unspecified severity: Secondary | ICD-10-CM

## 2017-03-19 DIAGNOSIS — Z794 Long term (current) use of insulin: Secondary | ICD-10-CM

## 2017-03-19 DIAGNOSIS — E11622 Type 2 diabetes mellitus with other skin ulcer: Secondary | ICD-10-CM | POA: Diagnosis not present

## 2017-03-19 DIAGNOSIS — Z87440 Personal history of urinary (tract) infections: Secondary | ICD-10-CM | POA: Diagnosis not present

## 2017-03-19 DIAGNOSIS — M5440 Lumbago with sciatica, unspecified side: Secondary | ICD-10-CM

## 2017-03-19 DIAGNOSIS — K219 Gastro-esophageal reflux disease without esophagitis: Secondary | ICD-10-CM

## 2017-03-19 DIAGNOSIS — E1140 Type 2 diabetes mellitus with diabetic neuropathy, unspecified: Secondary | ICD-10-CM | POA: Diagnosis not present

## 2017-03-19 DIAGNOSIS — M5441 Lumbago with sciatica, right side: Secondary | ICD-10-CM

## 2017-03-19 DIAGNOSIS — G8929 Other chronic pain: Secondary | ICD-10-CM | POA: Insufficient documentation

## 2017-03-19 DIAGNOSIS — I10 Essential (primary) hypertension: Secondary | ICD-10-CM

## 2017-03-19 DIAGNOSIS — Z6841 Body Mass Index (BMI) 40.0 and over, adult: Secondary | ICD-10-CM | POA: Diagnosis not present

## 2017-03-19 DIAGNOSIS — M549 Dorsalgia, unspecified: Secondary | ICD-10-CM

## 2017-03-19 LAB — URINALYSIS, ROUTINE W REFLEX MICROSCOPIC
Bilirubin Urine: NEGATIVE
Hgb urine dipstick: NEGATIVE
KETONES UR: NEGATIVE
NITRITE: NEGATIVE
RBC / HPF: NONE SEEN (ref 0–?)
Total Protein, Urine: NEGATIVE
URINE GLUCOSE: NEGATIVE
UROBILINOGEN UA: 0.2 (ref 0.0–1.0)
pH: 6 (ref 5.0–8.0)

## 2017-03-19 LAB — CBC
HEMATOCRIT: 37.1 % (ref 36.0–46.0)
HEMOGLOBIN: 12.1 g/dL (ref 12.0–15.0)
MCHC: 32.5 g/dL (ref 30.0–36.0)
MCV: 87.1 fl (ref 78.0–100.0)
PLATELETS: 285 10*3/uL (ref 150.0–400.0)
RBC: 4.26 Mil/uL (ref 3.87–5.11)
RDW: 17.6 % — ABNORMAL HIGH (ref 11.5–15.5)
WBC: 7.7 10*3/uL (ref 4.0–10.5)

## 2017-03-19 LAB — MICROALBUMIN / CREATININE URINE RATIO
CREATININE, U: 25.6 mg/dL
Microalb Creat Ratio: 5.5 mg/g (ref 0.0–30.0)
Microalb, Ur: 1.4 mg/dL (ref 0.0–1.9)

## 2017-03-19 MED ORDER — OMEPRAZOLE 20 MG PO CPDR: 20.0000 mg | DELAYED_RELEASE_CAPSULE | Freq: Every day | ORAL | 2 refills | Status: DC

## 2017-03-19 NOTE — Progress Notes (Signed)
HPI:   [name] is a 51 y.o. female, who is here today to establish care.  Former PCP: Not sure about provider's name, [facility]. Last preventive routine visit: 09/2015  Chronic medical problems: Complex medical Hx: Asthma,HTN,derpession and anxiety, DM II, and chronic pain among some.  She reports recent hospitalization due to CAP and skin infection (abdominal wall cellulitis) , 02/12/27 to 02/15/17. She did not follow with PCP as recommended. Completed IV abx treatment on 02/19/17 (PICC line).   CXR 02/14/17: Vascular congestion with left base atelectasis.   She takes Xanax 1 mg bid as needed, she is not on SSRI or SNRI. She follows with psychiatrist, sees Ms Gina [name] ([place]) at [facility], she follows every 2 months.     Diabetes Mellitus II:   Dx about 15+ years ago.  Currently on Novolog 70/30 12 U bid, Metformin 500 mg bid. Checking BS's : "good." Hypoglycemia: Denies.  She is tolerating medications well. Denies polydipsia, polyuria, or polyphagia. + intermittent numbness, tingling, and burning on LE's.  Obesity: She does not exercise regularly due to chronic pain. She tries to eat healthy, states that she doe snot "eat that much." She was on Phentermine before (earlier this year) and helped with wt loss but gained some back after stopping medication   Lab Results  Component Value Date   CREATININE 0.92 02/13/2017   BUN 11 02/13/2017   NA 140 02/13/2017   K 4.5 02/13/2017   CL 101 02/13/2017   CO2 34 (H) 02/13/2017    Lab Results  Component Value Date   HGBA1C 6.4 (H) 02/11/2017    HTN: Dx 15 years ago. Currently she is on Lisinopril-HCTZ 20-25 mg daily.  Denies severe/frequent headache (Hx of migraines), visual changes, chest pain, worsening dyspnea (Hx of asthma), palpitation, claudication, focal weakness, or edema.   Concerns today: Severe pain. Hx of chronic pain, she follows with [facility], missed last OV and has not re-schedule. She states that she receives a injection in her lower back (?epidural) for pain but did not help much.   Back pain , "nerve damage", stubbing pain on lower back,radiated to both LE's. Constant pxacerbated by movement and no alleviated factors identified. Mentions Hx of recurrent UTI and nephrolithiasis, she has not followed with urology in 2-3 years.  Hx of fibromyalgia. She is on Lyrica 200 mg bid.   Also c/o "pain on female parts" for a while, denies saddle anesthesia. Numbness and tingling + intermittent burning sensation on arms and LE's.   She also mentions that she is having an "open MRI" arranged because mass found on one of her kidneys. She denies gross hematuria or dysuria but has "pressure" sensation "down there."  States that she is not urinating "as much" as she should.  She just completed Septra treatment, prescribed at the [facility]. She has LE ulcers, appt today. RLE lesions have healed and lesions on pretibial LLE are healing.   GERD: Heartburn and acid reflux well controlled when she takes Prilosec daily. Food regurgitation at night sometimes. EGD 11/2016, she follows with Dr Kalman Shan.  Denies abdominal pain,vomiting, changes in bowel habits, blood in stool or melena. + Intermittent nausea.   Review of Systems  Constitutional: Positive for fatigue. Negative for appetite change, chills, fever and unexpected weight change.  HENT: Negative for mouth sores, nosebleeds, sore throat and trouble swallowing.   Eyes: Negative for redness and visual disturbance.  Respiratory: Negative for  cough, shortness of breath and wheezing.   Cardiovascular: Positive for leg swelling (stable). Negative for chest pain and palpitations.  Gastrointestinal: Positive for nausea. Negative for abdominal pain and vomiting.       Negative for changes in bowel habits.  Endocrine: Negative for cold intolerance, heat intolerance, polydipsia,  polyphagia and polyuria.  Genitourinary: Negative for decreased urine volume, dysuria and hematuria.  Musculoskeletal: Positive for arthralgias, back pain and myalgias.  Skin: Positive for wound. Negative for rash.  Allergic/Immunologic: Positive for environmental allergies.  Neurological: Positive for numbness. Negative for syncope, weakness and headaches.  Psychiatric/Behavioral: Positive for sleep disturbance. Negative for confusion. The patient is nervous/anxious.       Current Outpatient Prescriptions on File Prior to Visit  Medication Sig Dispense Refill  . albuterol (PROVENTIL HFA;VENTOLIN HFA) 108 (90 BASE) MCG/ACT inhaler Inhale 2 puffs into the lungs every 6 (six) hours as needed for wheezing or shortness of breath.     . ALPRAZolam (XANAX) 1 MG tablet Take 1 mg by mouth 2 (two) times daily as needed for anxiety.     Marland Kitchen atorvastatin (LIPITOR) 20 MG tablet Take 20 mg by mouth daily.    . cyclobenzaprine (FLEXERIL) 10 MG tablet Take 10 mg by mouth 3 (three) times daily as needed for muscle spasms.    . fluticasone (FLONASE) 50 MCG/ACT nasal spray Place 1 spray into both nostrils daily.    Marland Kitchen lisinopril-hydrochlorothiazide (PRINZIDE,ZESTORETIC) 20-25 MG tablet Take 1 tablet by mouth daily.    . metFORMIN (GLUCOPHAGE) 500 MG tablet Take 500 mg by mouth 2 (two) times daily with a meal.    . pregabalin (LYRICA) 200 MG capsule Take 200 mg by mouth 2 (two) times daily.    Marland Kitchen albuterol (PROVENTIL) (2.5 MG/3ML) 0.083% nebulizer solution Take 2.5 mg by nebulization every 6 (six) hours as needed for wheezing or shortness of breath.     No current facility-administered medications on file prior to visit.      Past Medical History:  Diagnosis Date  . Anemia   . Anxiety   . Arthritis   . Asthma   . Chronic pain   . Diabetes mellitus    type 2  . Family history of adverse reaction to anesthesia    youngest daughter hard to wake up both daughters post op n/v  . Fibromyalgia   . GERD  (gastroesophageal reflux disease)   . Headache    migraines  . History of kidney stones    2 big stones in left kidney now  . History of recurrent UTIs    last uti 3 weeks ago  . Hyperlipidemia   . Hypertension   . IBS (irritable bowel syndrome)   . Obesities, morbid ([place])   . Pneumonia 06/2016  . PONV (postoperative nausea and vomiting) 2004   came back after hystectomy bowels locked up on life support for 1 month   . Sinusitis   . 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 Wound discharge    open wound right leg going to wound center changes dressing qday with santyl cream quarter size wound   Allergies  Allergen Reactions  . Darvocet [Propoxyphene N-Acetaminophen] Nausea And Vomiting  . Morphine And Related Itching  . Penicillins Hives and Swelling    Tolerates Rocephin  Swelling all over body  Has patient had a PCN reaction causing immediate rash, facial/tongue/throat swelling, SOB or lightheadedness with hypotension: Yes Has patient had a PCN  reaction causing severe rash involving mucus membranes or skin necrosis: Yes Has patient had a PCN reaction that required hospitalization Unknown Has patient had a PCN reaction occurring within the last 10 years: No  If all of the above answers are "NO", then may proceed with Cephalosporin use.   . Tape Hives    Adhesive tape    Family History  Problem Relation Age of Onset  . Cancer Mother   . Heart failure Father   . Diabetes Father   . Hypertension Father     Social History   Social History  . Marital status: Married    Spouse name: Jenny Reichmann  . Number of children: 3  . Years of education: 64   Occupational History  . Disabled    Social History Main Topics  . Smoking status: Current Every Day Smoker    Packs/day: 0.50    Years: 33.00    Types: Cigarettes  . Smokeless tobacco: Never Used  . Alcohol use No  . Drug use: No  . Sexual activity: Yes    Birth control/ protection: None   Other Topics  Concern  . None   Social History Narrative   Smokes 1/2 ppd. Lives with husband and son.  Unemployed/disabled.  Independent of ADLs.    Vitals:   03/19/17 0716  BP: 130/76  Pulse: 85  Resp: 12  SpO2: 96%   Body mass index is 54.32 kg/m.   Physical Exam  Nursing note and vitals reviewed. Constitutional: She is oriented to person, place, and time. She appears well-developed. No distress.  HENT:  Head: Normocephalic and atraumatic.  Mouth/Throat: Oropharynx is clear and moist and mucous membranes are normal.  Eyes: Pupils are equal, round, and reactive to light. Conjunctivae and EOM are normal.  Cardiovascular: Normal rate and regular rhythm.   No murmur heard. Pulses:      Dorsalis pedis pulses are 2+ on the right side, and 2+ on the left side.  Respiratory: Effort normal and breath sounds normal. No respiratory distress.  GI: Soft. There is no tenderness.  Genitourinary:  Genitourinary Comments: Deferred to urology evaluation.  Musculoskeletal: She exhibits edema (1+ pitting edema LE bilateral.).  Tenderness upon palpation of paraspinal muscle lumbar R>L. No trigger points. LLE wrapped from right under knee to MTP joints.  Lymphadenopathy:    She has no cervical adenopathy.  Neurological: She is alert and oriented to person, place, and time. She has normal strength. Coordination normal.  Antalgic gait, not assisted.  Skin: Skin is warm. No rash noted. No erythema.  Psychiatric: Her mood appears anxious.  Appropriately groomed, poor eye contact.   Diabetic Foot Exam - Simple   Simple Foot Form Diabetic Foot exam was performed with the following findings:  Yes 03/19/2017 12:22 PM  Visual Inspection See comments:  Yes Sensation Testing See comments:  Yes Pulse Check See comments:  Yes Comments Right foot exam: Abnormal monofilament test. Hypertrophic long toe nails.     ASSESSMENT AND PLAN:   Ms. Judene was seen today for establish care.  Diagnoses and  all orders for this visit:  Lab Results  Component Value Date   WBC 7.7 03/19/2017   HGB 12.1 03/19/2017   HCT 37.1 03/19/2017   MCV 87.1 03/19/2017   PLT 285.0 03/19/2017    Type 2 diabetes mellitus with diabetic neuropathy, with long-term current use of insulin (HCC)  HgA1C at goal. No changes in current management. She is due for HgA1C in 3 months. Regular exercise  and healthy diet with avoidance of added sugar food intake is an important part of treatment and recommended. Annual eye exam, periodic dental and foot care recommended. F/U in 3 months  -     Microalbumin / creatinine urine ratio  History of recurrent UTIs  She just completed Bactrim and completed Ceftriaxone IV 3-4 weeks ago. Further recommendations will be given according to U/A and CBC results.  -     Urinalysis, Routine w reflex microscopic -     Ambulatory referral to Urology  Chronic bilateral low back pain with sciatica, sciatica laterality unspecified  Instructed to re-schedule appt with Neurology and [place]. Wt loss may help. Local heat and OTC topical meds may also help. Continue Lyrica.  Essential hypertension  Adequately controlled. No changes in current management. DASH-low salt diet recommended. F/U in 3-4 months, before if needed.  -     CBC  Gastroesophageal reflux disease, esophagitis presence not specified  GERD precautions discussed. Smoking cessation encouraged. No changes in current management. Continue following with Dr Kalman Shan.  -     omeprazole (PRILOSEC) 20 MG capsule; Take 1 capsule (20 mg total) by mouth daily before breakfast.  Morbid obesity with BMI of 50.0-59.9, adult ([place])  We discussed benefits of wt loss as well as adverse effects of obesity. Consistency with healthy diet and physical activity recommended. Daily brisk walking for 15-30 min as tolerated.  Right renal mass  Per pt report and discharge summery. I could not find recent imaging  report. According to pt, open MRI is being arranged. Adverse effects of tobacco use discussed.  Referral to urologists placed.  Abdominal/pelvic CT 01/2012:  1.  No acute findings in the abdomen or pelvis to account for the patient's symptoms.  Specifically, the appendix is normal in appearance. 2.  However, however, there is stranding throughout the fat of the inferior anterior abdominal wall, which could suggest edema and/orcellulitis.  Clinical correlation is recommended. 3.  Diffuse lower anterior abdominal wall laxity with a small umbilical hernia containing only omental fat. 4.  Status post cholecystectomy, total abdominal hysterectomy and bilateral salpingo-oophorectomy. 5.  Nonobstructive calculi in the lower pole collecting system ofthe left kidney, largest which measures 13 mm. 6.  Scarring in the upper pole of the right kidney, likely post infectious. 7.  Probable mild hepatic steatosis   -     Ambulatory referral to Urology   Ulcer of left lower extremity, unspecified ulcer stage ([place])  Healing well per pt report. Today she has appt with wound clinic.      Loyce Klasen G. [place], MD  [facility]. [place] office.

## 2017-03-19 NOTE — Patient Instructions (Addendum)
A few things to remember from today's visit:   Essential hypertension - Plan: CBC  History of recurrent UTIs - Plan: Urinalysis, Routine w reflex microscopic, Ambulatory referral to Urology  Type 2 diabetes mellitus with diabetic neuropathy, with long-term current use of insulin ([place]) - Plan: Microalbumin / creatinine urine ratio  Chronic bilateral low back pain with sciatica, sciatica laterality unspecified  Gastroesophageal reflux disease, esophagitis presence not specified - Plan: omeprazole (PRILOSEC) 20 MG capsule  Re-schedule appt with spine doctor.   Please be sure medication list is accurate. If a new problem present, please set up appointment sooner than planned today.

## 2017-03-23 ENCOUNTER — Telehealth: Payer: Self-pay

## 2017-03-23 NOTE — Telephone Encounter (Signed)
Pt is returning torri call

## 2017-03-23 NOTE — Telephone Encounter (Signed)
Spoke with patient regarding lab results nothing further needed 

## 2017-04-02 DIAGNOSIS — E11622 Type 2 diabetes mellitus with other skin ulcer: Secondary | ICD-10-CM | POA: Diagnosis not present

## 2017-04-16 ENCOUNTER — Encounter (HOSPITAL_BASED_OUTPATIENT_CLINIC_OR_DEPARTMENT_OTHER): Payer: Medicare Other | Attending: Internal Medicine

## 2017-04-16 DIAGNOSIS — I5020 Unspecified systolic (congestive) heart failure: Secondary | ICD-10-CM | POA: Insufficient documentation

## 2017-04-16 DIAGNOSIS — Z09 Encounter for follow-up examination after completed treatment for conditions other than malignant neoplasm: Secondary | ICD-10-CM | POA: Insufficient documentation

## 2017-04-16 DIAGNOSIS — F17210 Nicotine dependence, cigarettes, uncomplicated: Secondary | ICD-10-CM | POA: Insufficient documentation

## 2017-04-16 DIAGNOSIS — E119 Type 2 diabetes mellitus without complications: Secondary | ICD-10-CM | POA: Diagnosis not present

## 2017-04-16 DIAGNOSIS — Z8631 Personal history of diabetic foot ulcer: Secondary | ICD-10-CM | POA: Diagnosis not present

## 2017-04-26 ENCOUNTER — Telehealth: Payer: Self-pay | Admitting: Family Medicine

## 2017-04-26 ENCOUNTER — Other Ambulatory Visit: Payer: Self-pay | Admitting: Family Medicine

## 2017-04-26 NOTE — Telephone Encounter (Signed)
Okay to discontinue

## 2017-04-26 NOTE — Telephone Encounter (Signed)
If she does not feels like she needs it, supplemental O2 can be discontinued.  Thanks, BJ

## 2017-04-26 NOTE — Telephone Encounter (Signed)
Lyrica 200 mg to continue 1 cap tid can be called in. #90/1. Last filled on 03/11/17.  Thanks, BJ

## 2017-04-26 NOTE — Telephone Encounter (Signed)
Order on MD's desk to be signed.

## 2017-04-26 NOTE — Telephone Encounter (Signed)
Pt no longer needs the oxygen tank that hospital gave her. Pt fax [facility] to [facility] [phone]

## 2017-04-27 NOTE — Telephone Encounter (Signed)
Rx phoned in.   

## 2017-04-27 NOTE — Telephone Encounter (Signed)
Order faxed.

## 2017-05-04 ENCOUNTER — Other Ambulatory Visit: Payer: Self-pay | Admitting: Urology

## 2017-05-04 DIAGNOSIS — D4100 Neoplasm of uncertain behavior of unspecified kidney: Secondary | ICD-10-CM

## 2017-05-04 DIAGNOSIS — D4120 Neoplasm of uncertain behavior of unspecified ureter: Principal | ICD-10-CM

## 2017-05-19 ENCOUNTER — Ambulatory Visit
Admission: RE | Admit: 2017-05-19 | Discharge: 2017-05-19 | Disposition: A | Discharge status: Home / Self Care | Payer: Medicare Other | Source: Ambulatory Visit | Attending: Urology | Admitting: Urology

## 2017-05-19 DIAGNOSIS — D4120 Neoplasm of uncertain behavior of unspecified ureter: Principal | ICD-10-CM

## 2017-05-19 DIAGNOSIS — D4100 Neoplasm of uncertain behavior of unspecified kidney: Secondary | ICD-10-CM

## 2017-05-19 MED ORDER — GADOBENATE DIMEGLUMINE 529 MG/ML IV SOLN
20.0000 mL | Freq: Once | INTRAVENOUS | Status: AC | PRN
  Administered 2017-05-19: 20 mL via INTRAVENOUS

## 2017-06-24 NOTE — Progress Notes (Signed)
HPI:   [name] is a 51 y.o. female, who is here today with her husband to follow on some chronic medical problems.  She was last seen on 03/19/17, when she established care. She has Hx of chronic pain, follows [facility]. Last OV she was referred to urology to follow on recurrent UTI and nephrolithiasis.  According to pt, urologist recommend gyn evaluation to evaluate for endometriosis. S/P hysterectomy.  Anxiety: Following with psychiatrist.  HTN: Chronic problem. Currently she is on Lisinopril-HCTZ 20-25 mg daily.  Denies severe/frequent headache (Hx of chronic headache), visual changes, chest pain, focal weakness, or worsening edema. Occasional palpitations, chronic. She is reporting negative cardiac work-up and recommended continuing prn follow-ups.  BP at home: "Good"  HLD:  She was on Lipitor before, she does not remember side effects. She is not consistent with low fat diet.  She was taking Lipitor 20 mg until a few months ago. She tolerated medication well, does not remember side effects.   Diabetes Mellitus II:   Dx 15+ years ago.  Currently on Metformin 875 mg bid and Novolog 70/30 U 12 U bid. Last eye exam: "Almost" a year ago. FG: 120-140 Post prandial glucose: 160-170 and some days in the 200's. If less than 120 she gets "very sick" Hypoglycemia: Denies BS's < 70.  She is tolerating medications well.  She denies changes in abdominal pain (reporting chronic "stabbing" pain), nausea, vomiting, polydipsia, polyuria, or polyphagia. Stable numbness, tingling, and burning on LE's.  Lower extremity pain is severe, she wonders if Lyrica can be adjusted.  She is taking Lyrica 200 mg bid instead tid.    Lab Results  Component Value Date   CREATININE 0.92 02/13/2017   BUN 11 02/13/2017   NA 140 02/13/2017   K 4.5 02/13/2017   CL 101 02/13/2017   CO2 34 (H) 02/13/2017    Lab Results  Component Value Date   HGBA1C  6.4 (H) 02/11/2017   Lab Results  Component Value Date   MICROALBUR 1.4 03/19/2017    Obesity:  She is frustrated because she is not able to lose weight.  According to her husband, she is skips meals frequently.  She has not been consistent with following a healthy diet. Exercise: Not consistently due to pain.   Also complaining of generalized pain, lower back pain and upset because she has not received pharmacologic treatment.  States that she cannot sleep due to the pain, she is "in pain all the time." States that she is taking OTC BC's and she knows it is not good to take so many, it helps some.  She has history of fibromyalgia and chronic back pain with radiculopathy. She is established with chronic pain clinic but has not followed recently. She is reporting treatment with "steroid shots", they have not helped.   She denies urinary symptoms but requested urine test done today.  Today she is also complaining of intermittent wheezing and productive cough for the past few days. She has history of asthma and tobacco use. She is currently on albuterol inhaler. In the past she has been on Advair.  Skin rash: Hx of intertrigo on lower abdomin. She is requesting refill on Nystatin powder, which has helped with skin erythema and pruritus.   Review of Systems  Constitutional: Positive for fatigue. Negative for activity change, appetite change and fever.  HENT: Positive for postnasal drip and rhinorrhea. Negative for mouth sores, nosebleeds, sore throat and trouble  swallowing.   Eyes: Negative for redness and visual disturbance.  Respiratory: Positive for cough, shortness of breath and wheezing.   Cardiovascular: Positive for leg swelling (improved.). Negative for chest pain.  Gastrointestinal: Positive for abdominal pain. Negative for nausea and vomiting.       Negative for changes in bowel habits.  Endocrine: Negative for cold intolerance, heat intolerance, polydipsia, polyphagia  and polyuria.  Genitourinary: Negative for decreased urine volume, dysuria and hematuria.  Musculoskeletal: Positive for arthralgias, back pain, gait problem and myalgias.  Skin: Negative for rash and wound.  Allergic/Immunologic: Positive for environmental allergies.  Neurological: Positive for numbness and headaches. Negative for syncope and weakness.  Hematological: Negative for adenopathy. Does not bruise/bleed easily.  Psychiatric/Behavioral: Positive for sleep disturbance. Negative for confusion. The patient is nervous/anxious.       Current Outpatient Medications on File Prior to Visit  Medication Sig Dispense Refill  . ALPRAZolam (XANAX) 1 MG tablet Take 1 mg by mouth 2 (two) times daily as needed for anxiety.     . cyclobenzaprine (FLEXERIL) 10 MG tablet Take 10 mg by mouth 3 (three) times daily as needed for muscle spasms.    . fluticasone (FLONASE) 50 MCG/ACT nasal spray Place 1 spray into both nostrils daily.    Marland Kitchen LYRICA 200 MG capsule TAKE ONE CAPSULE BY MOUTH 3 TIMES A DAY 90 capsule 1  . omeprazole (PRILOSEC) 20 MG capsule Take 1 capsule (20 mg total) by mouth daily before breakfast. 90 capsule 2   No current facility-administered medications on file prior to visit.      Past Medical History:  Diagnosis Date  . Anemia   . Anxiety   . Arthritis   . Asthma   . Chronic pain   . Diabetes mellitus    type 2  . Family history of adverse reaction to anesthesia    youngest daughter hard to wake up both daughters post op n/v  . Fibromyalgia   . GERD (gastroesophageal reflux disease)   . Headache    migraines  . History of kidney stones    2 big stones in left kidney now  . History of recurrent UTIs    last uti 3 weeks ago  . Hyperlipidemia   . Hypertension   . IBS (irritable bowel syndrome)   . Obesities, morbid ([place])   . Pneumonia 06/2016  . PONV (postoperative nausea and vomiting) 2004   came back after hystectomy bowels locked up on life support for 1 month     . Sinusitis   . 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 Wound discharge    open wound right leg going to wound center changes dressing qday with santyl cream quarter size wound   Allergies  Allergen Reactions  . Darvocet [Propoxyphene N-Acetaminophen] Nausea And Vomiting  . Morphine And Related Itching  . Penicillins Hives and Swelling    Tolerates Rocephin  Swelling all over body  Has patient had a PCN reaction causing immediate rash, facial/tongue/throat swelling, SOB or lightheadedness with hypotension: Yes Has patient had a PCN reaction causing severe rash involving mucus membranes or skin necrosis: Yes Has patient had a PCN reaction that required hospitalization Unknown Has patient had a PCN reaction occurring within the last 10 years: No  If all of the above answers are "NO", then may proceed with Cephalosporin use.   . Tape Hives    Adhesive tape    Social History  Socioeconomic History  . Marital status: Married    Spouse name: Jenny Reichmann  . Number of children: 3  . Years of education: 3  . Highest education level: None  Social Needs  . Financial resource strain: None  . Food insecurity - worry: None  . Food insecurity - inability: None  . Transportation needs - medical: None  . Transportation needs - non-medical: None  Occupational History  . Occupation: Disabled  Tobacco Use  . Smoking status: Current Every Day Smoker    Packs/day: 0.50    Years: 33.00    Pack years: 16.50    Types: Cigarettes  . Smokeless tobacco: Never Used  Substance and Sexual Activity  . Alcohol use: No  . Drug use: No  . Sexual activity: Yes    Birth control/protection: None  Other Topics Concern  . None  Social History Narrative   Smokes 1/2 ppd. Lives with husband and son.  Unemployed/disabled.  Independent of ADLs.    Vitals:   06/25/17 0936  BP: 132/70  Pulse: 82  Resp: 16  Temp: 98.2 F (36.8 C)  SpO2: 95%   Body mass index is 54.63 kg/m.   Wt  Readings from Last 3 Encounters:  06/25/17 (!) 318 lb 4 oz (144.4 kg)  03/19/17 (!) 316 lb 7 oz (143.5 kg)  02/14/17 (!) 346 lb 6.4 oz (157.1 kg)    Physical Exam  Nursing note and vitals reviewed. Constitutional: She is oriented to person, place, and time. She appears well-developed. No distress.  HENT:  Head: Normocephalic and atraumatic.  Mouth/Throat: Oropharynx is clear and moist and mucous membranes are normal.  Eyes: Conjunctivae are normal. Pupils are equal, round, and reactive to light.  Cardiovascular: Normal rate and regular rhythm.  No murmur heard. Pulses:      Dorsalis pedis pulses are 2+ on the right side, and 2+ on the left side.  Respiratory: Effort normal and breath sounds normal. No respiratory distress.  GI: Soft. She exhibits no mass. There is no tenderness.  Musculoskeletal: She exhibits edema (1+ pitting LE edema,bilateral.).  Lymphadenopathy:    She has no cervical adenopathy.  Neurological: She is alert and oriented to person, place, and time. She has normal strength. Coordination normal.  Stable gait with no assistance.  Skin: Skin is warm. No rash noted. No erythema.  On lower abdomen hyperpigmentation changes on dry skin. No active rash appreciated.  Psychiatric: Her mood appears anxious.  Poorly groomed, good eye contact.   Diabetic Foot Exam - Simple   Simple Foot Form Diabetic Foot exam was performed with the following findings:  Yes 06/25/2017 10:38 AM  Visual Inspection See comments:  Yes Sensation Testing See comments:  Yes Pulse Check Posterior Tibialis and Dorsalis pulse intact bilaterally:  Yes Comments Hypertrophic toenails. Abescence monofilament bilateral forefoot.     ASSESSMENT AND PLAN:   Ms. Tashe was seen today for follow-up.  Diagnoses and all orders for this visit:  Lab Results  Component Value Date   HGBA1C 6.4 06/25/2017   Lab Results  Component Value Date   CREATININE 0.86 06/25/2017   BUN 13 06/25/2017    NA 140 06/25/2017   K 4.3 06/25/2017   CL 103 06/25/2017   CO2 28 06/25/2017    Mild intermittent asthma without complication  Not well controlled, ? COPD. Advair 250-50 mcg bid added today. Albuterol inh 2 puff every 6 hours for a week then as needed for wheezing or shortness of breath.  Strongly recommend smoking  cessation. F/U in 4 months.  -     Fluticasone-Salmeterol (ADVAIR DISKUS) 250-50 MCG/DOSE AEPB; Inhale 1 puff 2 (two) times daily into the lungs. -     albuterol (PROVENTIL HFA;VENTOLIN HFA) 108 (90 Base) MCG/ACT inhaler; Inhale 2 puffs every 6 (six) hours as needed into the lungs for wheezing or shortness of breath.  Type 2 diabetes mellitus with diabetic neuropathy, with long-term current use of insulin ([place])  HgA1C has been at goal. No changes in current management, will adjust meds according to lab. Regular exercise and healthy diet with avoidance of added sugar food intake is an important part of treatment and recommended. Annual eye exam, periodic dental and foot care recommended. F/U in 4 months  -     Hemoglobin A1c -     Fructosamine -     Basic metabolic panel  Essential hypertension  Adequately controlled. No changes in current management. DASH-low salt diet recommended. Eye exam recommended annually. F/U in 6 months, before if needed.  -     Basic metabolic panel  Morbid obesity with BMI of 50.0-59.9, adult ([place])  We discussed benefits of wt loss as well as adverse effects of obesity. Consistency with healthy diet and physical activity recommended. Daily physical activity for 15-30 min as tolerated.  Hyperlipidemia, unspecified hyperlipidemia type  Resume Lipitor 20 mg, will follow labs done today and will give further recommendations accordingly. Low fat diet. F/U in 6-12 months.  -     atorvastatin (LIPITOR) 20 MG tablet; Take 1 tablet (20 mg total) daily by mouth.  Chronic midline low back pain without sciatica  This is not related to  acute UTI. She has Hx if chronic back pain. She can try topical [facility] or similar products. She also takes Flexeril 10 mg tid prn.  Recommend arranging appt with pain management provider.  -     POCT urinalysis dipstick  Peripheral neuropathic pain  Increase Lyrica from 200 mg bid to tid. Some side effects discussed. Good foot care also recommended.  Intertrigo  Well controlled. Continue Nystatin powder on area prn. F/U as needed.  -     nystatin (NYSTATIN) powder; Apply 4 (four) times daily topically.    -Ms. LOLAMAE VOISIN was advised to return sooner than planned today if new concerns arise.       Betty G. Martinique, MD  [employer]. [place] office.

## 2017-06-25 ENCOUNTER — Other Ambulatory Visit: Payer: Self-pay | Admitting: *Deleted

## 2017-06-25 ENCOUNTER — Encounter: Payer: Self-pay | Admitting: Family Medicine

## 2017-06-25 ENCOUNTER — Ambulatory Visit (INDEPENDENT_AMBULATORY_CARE_PROVIDER_SITE_OTHER): Payer: Medicare Other | Admitting: Family Medicine

## 2017-06-25 VITALS — BP 132/70 | HR 82 | Temp 98.2°F | Resp 16 | Ht 64.0 in | Wt 318.2 lb

## 2017-06-25 DIAGNOSIS — Z6841 Body Mass Index (BMI) 40.0 and over, adult: Secondary | ICD-10-CM | POA: Diagnosis not present

## 2017-06-25 DIAGNOSIS — I10 Essential (primary) hypertension: Secondary | ICD-10-CM

## 2017-06-25 DIAGNOSIS — E1140 Type 2 diabetes mellitus with diabetic neuropathy, unspecified: Secondary | ICD-10-CM | POA: Diagnosis not present

## 2017-06-25 DIAGNOSIS — L304 Erythema intertrigo: Secondary | ICD-10-CM | POA: Diagnosis not present

## 2017-06-25 DIAGNOSIS — J4520 Mild intermittent asthma, uncomplicated: Secondary | ICD-10-CM | POA: Diagnosis not present

## 2017-06-25 DIAGNOSIS — G8929 Other chronic pain: Secondary | ICD-10-CM

## 2017-06-25 DIAGNOSIS — Z794 Long term (current) use of insulin: Secondary | ICD-10-CM

## 2017-06-25 DIAGNOSIS — G894 Chronic pain syndrome: Secondary | ICD-10-CM | POA: Insufficient documentation

## 2017-06-25 DIAGNOSIS — E785 Hyperlipidemia, unspecified: Secondary | ICD-10-CM

## 2017-06-25 DIAGNOSIS — M545 Low back pain: Secondary | ICD-10-CM

## 2017-06-25 DIAGNOSIS — M792 Neuralgia and neuritis, unspecified: Secondary | ICD-10-CM | POA: Diagnosis not present

## 2017-06-25 LAB — POCT URINALYSIS DIPSTICK
BILIRUBIN UA: NEGATIVE
Blood, UA: NEGATIVE
GLUCOSE UA: NEGATIVE
KETONES UA: NEGATIVE
NITRITE UA: NEGATIVE
Protein, UA: NEGATIVE
Spec Grav, UA: 1.01 (ref 1.010–1.025)
Urobilinogen, UA: 0.2 E.U./dL
pH, UA: 6 (ref 5.0–8.0)

## 2017-06-25 LAB — HEMOGLOBIN A1C: Hgb A1c MFr Bld: 6.4 % (ref 4.6–6.5)

## 2017-06-25 LAB — BASIC METABOLIC PANEL
BUN: 13 mg/dL (ref 6–23)
CHLORIDE: 103 meq/L (ref 96–112)
CO2: 28 meq/L (ref 19–32)
Calcium: 9.6 mg/dL (ref 8.4–10.5)
Creatinine, Ser: 0.86 mg/dL (ref 0.40–1.20)
GFR: 73.34 mL/min (ref >60.00)
GLUCOSE: 125 mg/dL — AB (ref 70–99)
POTASSIUM: 4.3 meq/L (ref 3.5–5.1)
SODIUM: 140 meq/L (ref 135–145)

## 2017-06-25 MED ORDER — NYSTATIN 100000 UNIT/GM EX POWD: Freq: Four times a day (QID) | CUTANEOUS | 6 refills | Status: DC

## 2017-06-25 MED ORDER — FLUTICASONE-SALMETEROL 250-50 MCG/DOSE IN AEPB: 1.0000 | INHALATION_SPRAY | Freq: Two times a day (BID) | RESPIRATORY_TRACT | 3 refills | Status: DC

## 2017-06-25 MED ORDER — ATORVASTATIN CALCIUM 20 MG PO TABS: 20.0000 mg | ORAL_TABLET | Freq: Every day | ORAL | 2 refills | Status: DC

## 2017-06-25 MED ORDER — ALBUTEROL SULFATE HFA 108 (90 BASE) MCG/ACT IN AERS: 2.0000 | INHALATION_SPRAY | Freq: Four times a day (QID) | RESPIRATORY_TRACT | 4 refills | Status: DC | PRN

## 2017-06-25 NOTE — Patient Instructions (Addendum)
A few things to remember from today's visit:   Type 2 diabetes mellitus with diabetic neuropathy, with long-term current use of insulin ([place]) - Plan: Hemoglobin A1c, Fructosamine, Basic metabolic panel  Essential hypertension - Plan: Basic metabolic panel  Morbid obesity with BMI of 50.0-59.9, adult ([facility])  Hyperlipidemia, unspecified hyperlipidemia type - Plan: atorvastatin (LIPITOR) 20 MG tablet  Chronic midline low back pain without sciatica - Plan: POCT urinalysis dipstick  Mild intermittent asthma without complication - Plan: Fluticasone-Salmeterol (ADVAIR DISKUS) 250-50 MCG/DOSE AEPB, albuterol (PROVENTIL HFA;VENTOLIN HFA) 108 (90 Base) MCG/ACT inhaler  Today Advair added. Increase Lyrica to 3 times per day.  Continue following with pain clinic.  Smoking cessation strongly recommended.   Please be sure medication list is accurate. If a new problem present, please set up appointment sooner than planned today.

## 2017-06-28 LAB — FRUCTOSAMINE: Fructosamine: 248 umol/L (ref 190–270)

## 2017-06-29 ENCOUNTER — Telehealth: Payer: Self-pay | Admitting: Family Medicine

## 2017-06-29 MED ORDER — LISINOPRIL-HYDROCHLOROTHIAZIDE 20-25 MG PO TABS: 1.0000 | ORAL_TABLET | Freq: Every day | ORAL | 2 refills | Status: DC

## 2017-06-29 MED ORDER — METFORMIN HCL 850 MG PO TABS: 850.0000 mg | ORAL_TABLET | Freq: Two times a day (BID) | ORAL | 2 refills | Status: DC

## 2017-06-29 MED ORDER — INSULIN NPH ISOPHANE & REGULAR (70-30) 100 UNIT/ML ~~LOC~~ SUSP: 12.0000 [IU] | Freq: Two times a day (BID) | SUBCUTANEOUS | 2 refills | Status: DC

## 2017-06-29 NOTE — Telephone Encounter (Signed)
This was the plan in case her DM was not at goal. HgA1C was at goal, so no changes.  Thanks, BJ

## 2017-06-29 NOTE — Telephone Encounter (Signed)
Copied from [place] [id number]. Topic: General - Other >> Jun 29, 2017  1:58 PM Scherrie Gerlach wrote: Reason for CRM: pt states last Friday appt with Dr Martinique , Dr Martinique was going to try a new injectable dm med and nothing was called in. Please advise  [id number] - [place], [place] - [address] [phone] (Phone) [phone] (Fax)

## 2017-06-30 NOTE — Telephone Encounter (Signed)
Informed patient of lab results. Patient verbalized understanding. 

## 2017-07-22 ENCOUNTER — Other Ambulatory Visit: Payer: Self-pay | Admitting: Family Medicine

## 2017-07-26 ENCOUNTER — Telehealth: Payer: Self-pay | Admitting: Family Medicine

## 2017-07-26 NOTE — Telephone Encounter (Signed)
Copied from [place] [id number]. Topic: General - Other >> Jul 26, 2017  9:54 AM Darl Householder, RMA wrote: Reason for CRM: Medication refill request for Lyrica 200 mg to be sent to [facility] [place] [address], pt is completely out of medication

## 2017-07-27 MED ORDER — PREGABALIN 200 MG PO CAPS: 200.0000 mg | ORAL_CAPSULE | Freq: Every day | ORAL | 1 refills | Status: DC

## 2017-07-27 NOTE — Telephone Encounter (Signed)
Rx for Lyrica was sent. BJ

## 2017-07-27 NOTE — Addendum Note (Signed)
Addended by: Zacarias Pontes on: 07/27/2017 07:09 AM   Modules accepted: Orders

## 2017-09-03 ENCOUNTER — Telehealth: Payer: Self-pay | Admitting: Family Medicine

## 2017-09-03 ENCOUNTER — Other Ambulatory Visit: Payer: Self-pay | Admitting: *Deleted

## 2017-09-03 MED ORDER — GLUCOSE BLOOD VI STRP: ORAL_STRIP | 12 refills | Status: DC

## 2017-09-03 NOTE — Telephone Encounter (Signed)
Request for diabetic test strip; last seen by Dr Martinique 06/25/17; pharmacy verified

## 2017-09-03 NOTE — Telephone Encounter (Signed)
Copied from [place] [id number]. Topic: Inquiry >> Sep 03, 2017  9:57 AM Patrice Paradise wrote: Reason for CRM: Patient called to see if Dr. Martinique would call in a RX for test stripes so she can test her blood sugar. She would like for them to be send to  [id number] - [place], [place] - [address] [place] [address] Phone: [phone] Fax: [phone]

## 2017-09-03 NOTE — Telephone Encounter (Signed)
It is Ok to sent glucose strips as she requested.  Thanks, BJ

## 2017-09-03 NOTE — Telephone Encounter (Signed)
Rx sent to pharmacy   

## 2017-09-09 ENCOUNTER — Other Ambulatory Visit: Payer: Self-pay | Admitting: *Deleted

## 2017-09-09 DIAGNOSIS — Z794 Long term (current) use of insulin: Principal | ICD-10-CM

## 2017-09-09 DIAGNOSIS — E1140 Type 2 diabetes mellitus with diabetic neuropathy, unspecified: Secondary | ICD-10-CM

## 2017-09-09 MED ORDER — GLUCOSE BLOOD VI STRP: ORAL_STRIP | 12 refills | Status: DC

## 2017-10-09 ENCOUNTER — Other Ambulatory Visit: Payer: Self-pay | Admitting: Family Medicine

## 2017-10-09 DIAGNOSIS — K219 Gastro-esophageal reflux disease without esophagitis: Secondary | ICD-10-CM

## 2017-10-14 ENCOUNTER — Other Ambulatory Visit: Payer: Self-pay | Admitting: Family Medicine

## 2017-10-15 ENCOUNTER — Other Ambulatory Visit: Payer: Self-pay | Admitting: Family Medicine

## 2017-10-15 DIAGNOSIS — J4520 Mild intermittent asthma, uncomplicated: Secondary | ICD-10-CM

## 2017-10-15 NOTE — Telephone Encounter (Signed)
Pt called to check on refill for Lyrica. She is now out and said the pharmacy told her they have requested twice. Please advise.  [id number] - [place], [place] - [address] [phone] (Phone) [phone] (Fax)

## 2017-10-15 NOTE — Telephone Encounter (Signed)
Request sent to Dr. Martinique, waiting on approval.

## 2017-10-20 ENCOUNTER — Other Ambulatory Visit: Payer: Self-pay

## 2017-10-20 ENCOUNTER — Emergency Department (HOSPITAL_COMMUNITY): Payer: Medicare Other

## 2017-10-20 ENCOUNTER — Encounter (HOSPITAL_COMMUNITY): Payer: Self-pay

## 2017-10-20 DIAGNOSIS — R079 Chest pain, unspecified: Secondary | ICD-10-CM | POA: Diagnosis present

## 2017-10-20 DIAGNOSIS — R0602 Shortness of breath: Secondary | ICD-10-CM | POA: Diagnosis not present

## 2017-10-20 DIAGNOSIS — R197 Diarrhea, unspecified: Secondary | ICD-10-CM | POA: Insufficient documentation

## 2017-10-20 DIAGNOSIS — J45909 Unspecified asthma, uncomplicated: Secondary | ICD-10-CM | POA: Insufficient documentation

## 2017-10-20 DIAGNOSIS — Z794 Long term (current) use of insulin: Secondary | ICD-10-CM | POA: Diagnosis not present

## 2017-10-20 DIAGNOSIS — E1140 Type 2 diabetes mellitus with diabetic neuropathy, unspecified: Secondary | ICD-10-CM | POA: Insufficient documentation

## 2017-10-20 DIAGNOSIS — R112 Nausea with vomiting, unspecified: Secondary | ICD-10-CM | POA: Diagnosis not present

## 2017-10-20 DIAGNOSIS — J209 Acute bronchitis, unspecified: Secondary | ICD-10-CM | POA: Diagnosis not present

## 2017-10-20 DIAGNOSIS — Z79899 Other long term (current) drug therapy: Secondary | ICD-10-CM | POA: Diagnosis not present

## 2017-10-20 DIAGNOSIS — F17210 Nicotine dependence, cigarettes, uncomplicated: Secondary | ICD-10-CM | POA: Diagnosis not present

## 2017-10-20 DIAGNOSIS — I10 Essential (primary) hypertension: Secondary | ICD-10-CM | POA: Diagnosis not present

## 2017-10-20 NOTE — ED Triage Notes (Signed)
Pt reports centralized chest pain starting 2 days ago. She also states that she has had cold symptoms for the last 4 days, including cough, fever, and nausea. Chest pain worsened today which prompted her to come in. A&Ox4. Ambulatory.

## 2017-10-21 ENCOUNTER — Emergency Department (HOSPITAL_COMMUNITY)
Admission: EM | Admit: 2017-10-21 | Discharge: 2017-10-21 | Disposition: A | Discharge status: Home / Self Care | Payer: Medicare Other | Attending: Emergency Medicine | Admitting: Emergency Medicine

## 2017-10-21 ENCOUNTER — Encounter (HOSPITAL_COMMUNITY): Payer: Self-pay | Admitting: Emergency Medicine

## 2017-10-21 DIAGNOSIS — J209 Acute bronchitis, unspecified: Secondary | ICD-10-CM

## 2017-10-21 DIAGNOSIS — R079 Chest pain, unspecified: Secondary | ICD-10-CM

## 2017-10-21 LAB — BASIC METABOLIC PANEL
Anion gap: 14 (ref 5–15)
BUN: 25 mg/dL — AB (ref 6–20)
CO2: 25 mmol/L (ref 22–32)
Calcium: 9.7 mg/dL (ref 8.9–10.3)
Chloride: 101 mmol/L (ref 101–111)
Creatinine, Ser: 1.3 mg/dL — ABNORMAL HIGH (ref 0.44–1.00)
GFR, EST AFRICAN AMERICAN: 53 mL/min — AB (ref >60)
GFR, EST NON AFRICAN AMERICAN: 46 mL/min — AB (ref >60)
Glucose, Bld: 168 mg/dL — ABNORMAL HIGH (ref 65–99)
POTASSIUM: 4.2 mmol/L (ref 3.5–5.1)
SODIUM: 140 mmol/L (ref 135–145)

## 2017-10-21 LAB — I-STAT TROPONIN, ED
Troponin i, poc: 0.01 ng/mL (ref 0.00–0.08)
Troponin i, poc: 0.02 ng/mL (ref 0.00–0.08)

## 2017-10-21 LAB — URINALYSIS, MICROSCOPIC (REFLEX): BACTERIA UA: NONE SEEN

## 2017-10-21 LAB — URINALYSIS, ROUTINE W REFLEX MICROSCOPIC
GLUCOSE, UA: NEGATIVE mg/dL
Hgb urine dipstick: NEGATIVE
LEUKOCYTES UA: NEGATIVE
Nitrite: NEGATIVE
PH: 5.5 (ref 5.0–8.0)
Specific Gravity, Urine: >1.03 — ABNORMAL HIGH (ref 1.005–1.030)

## 2017-10-21 LAB — CBC
HEMATOCRIT: 38.9 % (ref 36.0–46.0)
Hemoglobin: 11.8 g/dL — ABNORMAL LOW (ref 12.0–15.0)
MCH: 25.8 pg — ABNORMAL LOW (ref 26.0–34.0)
MCHC: 30.3 g/dL (ref 30.0–36.0)
MCV: 84.9 fL (ref 78.0–100.0)
PLATELETS: 299 10*3/uL (ref 150–400)
RBC: 4.58 MIL/uL (ref 3.87–5.11)
RDW: 17.2 % — AB (ref 11.5–15.5)
WBC: 10.8 10*3/uL — AB (ref 4.0–10.5)

## 2017-10-21 LAB — I-STAT BETA HCG BLOOD, ED (MC, WL, AP ONLY)

## 2017-10-21 LAB — BRAIN NATRIURETIC PEPTIDE: B Natriuretic Peptide: 10.2 pg/mL (ref 0.0–100.0)

## 2017-10-21 MED ORDER — IPRATROPIUM-ALBUTEROL 0.5-2.5 (3) MG/3ML IN SOLN
3.0000 mL | Freq: Once | RESPIRATORY_TRACT | Status: AC
  Administered 2017-10-21: 3 mL via RESPIRATORY_TRACT
  Filled 2017-10-21: qty 3

## 2017-10-21 MED ORDER — AZITHROMYCIN 250 MG PO TABS: 250.0000 mg | ORAL_TABLET | Freq: Every day | ORAL | 0 refills | Status: DC

## 2017-10-21 NOTE — Discharge Instructions (Signed)
Your evaluated in the emergency department for chest pain in the setting of fever cough nausea vomiting and diarrhea.  Your EKG lab work and chest x-ray did not show an obvious cause of your chest pain.  You will need to call your doctor to follow-up with him regarding further testing.  Please return to the emergency department if any worsening symptoms.

## 2017-10-21 NOTE — ED Notes (Signed)
Discharge instructions reviewed with patient. Patient verbalizes understanding. VSS.   

## 2017-10-21 NOTE — ED Provider Notes (Signed)
[place] DEPT Provider Note   CSN: [id number] Arrival date & time: 10/20/17  2247     History   Chief Complaint Chief Complaint  Patient presents with  . Chest Pain    HPI Gina [name] is a 52 y.o. female.  She is presenting with for 5 days of nausea vomiting diarrhea cough productive of green sputum in the setting of a fever.  There is been no blood from either end.  Starting yesterday she has had ongoing chest tightness with episodes of severe pain that radiate from her lower chest to the center of her chest that stabbing in nature and severe.  Those episodes last a few minutes.  She is concerned she was having a heart attack so she is presented to the ED overnight and is been here since then.  Her symptoms continue until now.  She is never had any cardiac disease but has a family history.  She states she has had negative stress tests in the past.  The history is provided by the patient.  Chest Pain   This is a new problem. The current episode started yesterday. The problem occurs constantly. The problem has not changed since onset.The pain is associated with coughing. The pain is present in the substernal region and lateral region. The pain is severe. The quality of the pain is described as stabbing. The pain does not radiate. Associated symptoms include abdominal pain, back pain, cough, a fever, nausea, shortness of breath and vomiting. Pertinent negatives include no palpitations. She has tried nothing for the symptoms. The treatment provided no relief. Risk factors include obesity and smoking/tobacco exposure.  Her past medical history is significant for diabetes, hyperlipidemia and hypertension.  Pertinent negatives for past medical history include no seizures.  Her family medical history is significant for CAD.    Past Medical History:  Diagnosis Date  . Anemia   . Anxiety   . Arthritis   . Asthma   . Chronic pain   . Diabetes mellitus    type 2  . Family history of adverse reaction to anesthesia    youngest daughter hard to wake up both daughters post op n/v  . Fibromyalgia   . GERD (gastroesophageal reflux disease)   . Headache    migraines  . History of kidney stones    2 big stones in left kidney now  . History of recurrent UTIs    last uti 3 weeks ago  . Hyperlipidemia   . Hypertension   . IBS (irritable bowel syndrome)   . Obesities, morbid ([place])   . Pneumonia 06/2016  . PONV (postoperative nausea and vomiting) 2004   came back after hystectomy bowels locked up on life support for 1 month   . Sinusitis   . 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 Wound discharge    open wound right leg going to wound center changes dressing qday with santyl cream quarter size wound    Patient Active Problem List   Diagnosis Date Noted  . Chronic pain disorder 06/25/2017  . Intertrigo 06/25/2017  . Chronic back pain 03/19/2017  . GERD (gastroesophageal reflux disease) 03/19/2017  . Morbid obesity with BMI of 50.0-59.9, adult ([place]) 03/19/2017  . Bacteremia due to group B Streptococcus 02/11/2017  . Volume overload 02/11/2017  . Acute encephalopathy 02/11/2017  . Right renal mass 02/11/2017  . Normocytic anemia 02/11/2017  . Generalized abdominal pain   .  Constipation   . Benign neoplasm of rectum   . Nausea and vomiting   . Shortness of breath 06/25/2016  . CAP (community acquired pneumonia) 06/25/2016  . History of recurrent UTIs 06/25/2016  . Hyperlipidemia 06/25/2016  . Type 2 diabetes mellitus with diabetic neuropathy, unspecified ([place]) 06/25/2016  . Hypertension 06/25/2016  . Asthma 06/25/2016  . Polypharmacy 06/25/2016  . Eosinophilia 04/23/2014    Past Surgical History:  Procedure Laterality Date  . ABDOMINAL HYSTERECTOMY     1 ovary left  . CARPAL TUNNEL RELEASE Right   . CESAREAN SECTION     x 2  . CHOLECYSTECTOMY    . COLONOSCOPY WITH PROPOFOL N/A 12/03/2016   Procedure:  COLONOSCOPY WITH PROPOFOL;  Surgeon: Doran Stabler, MD;  Location: [facility];  Service: Gastroenterology;  Laterality: N/A;  . CYSTECTOMY     left foot  . ESOPHAGOGASTRODUODENOSCOPY (EGD) WITH PROPOFOL N/A 12/03/2016   Procedure: ESOPHAGOGASTRODUODENOSCOPY (EGD) WITH PROPOFOL;  Surgeon: Doran Stabler, MD;  Location: [facility];  Service: Gastroenterology;  Laterality: N/A;  . LITHOTRIPSY    . MULTIPLE TOOTH EXTRACTIONS      OB History    No data available       Home Medications    Prior to Admission medications   Medication Sig Start Date End Date Taking? Authorizing Provider  ADVAIR DISKUS 250-50 MCG/DOSE AEPB INHALE 1 PUFF 2 (TWO) TIMES DAILY INTO THE LUNGS. 10/15/17   Martinique, Betty G, MD  albuterol (PROVENTIL HFA;VENTOLIN HFA) 108 (90 Base) MCG/ACT inhaler Inhale 2 puffs every 6 (six) hours as needed into the lungs for wheezing or shortness of breath. 06/25/17   Martinique, Betty G, MD  ALPRAZolam Duanne Moron) 1 MG tablet Take 1 mg by mouth 2 (two) times daily as needed for anxiety.     [provider]  atorvastatin (LIPITOR) 20 MG tablet Take 1 tablet (20 mg total) daily by mouth. 06/25/17   Martinique, Betty G, MD  cyclobenzaprine (FLEXERIL) 10 MG tablet Take 10 mg by mouth 3 (three) times daily as needed for muscle spasms.    [provider]  fluticasone (FLONASE) 50 MCG/ACT nasal spray Place 1 spray into both nostrils daily. 06/18/16   [provider]  glucose blood test strip Use as instructed 09/09/17   Martinique, Betty G, MD  insulin NPH-regular Human (NOVOLIN 70/30) (70-30) 100 UNIT/ML injection Inject 12 Units into the skin 2 (two) times daily with a meal. 06/29/17   Martinique, Betty G, MD  lisinopril-hydrochlorothiazide (PRINZIDE,ZESTORETIC) 20-25 MG tablet Take 1 tablet by mouth daily. 06/29/17   Martinique, Betty G, MD  LYRICA 200 MG capsule TAKE 1 CAPSULE BY MOUTH THREE TIMES A DAY 10/18/17   Martinique, Betty G, MD  metFORMIN (GLUCOPHAGE) 850 MG tablet Take 1  tablet (850 mg total) by mouth 2 (two) times daily with a meal. 06/29/17   Martinique, Betty G, MD  nystatin (NYSTATIN) powder Apply 4 (four) times daily topically. 06/25/17   Martinique, Betty G, MD  omeprazole (PRILOSEC) 20 MG capsule TAKE 1 CAPSULE (20 MG TOTAL) BY MOUTH DAILY BEFORE BREAKFAST. 10/11/17   Martinique, Betty G, MD  pregabalin (LYRICA) 200 MG capsule Take 1 capsule (200 mg total) by mouth daily. 07/27/17   Martinique, Betty G, MD    Family History Family History  Problem Relation Age of Onset  . Cancer Mother   . Heart failure Father   . Diabetes Father   . Hypertension Father     Social History Social  History   Tobacco Use  . Smoking status: Current Every Day Smoker    Packs/day: 0.50    Years: 33.00    Pack years: 16.50    Types: Cigarettes  . Smokeless tobacco: Never Used  Substance Use Topics  . Alcohol use: No  . Drug use: No     Allergies   Darvocet [propoxyphene n-acetaminophen]; Morphine and related; Penicillins; and Tape   Review of Systems Review of Systems  Constitutional: Positive for fever. Negative for chills.  HENT: Positive for rhinorrhea. Negative for ear pain and sore throat.   Eyes: Negative for pain and visual disturbance.  Respiratory: Positive for cough and shortness of breath.   Cardiovascular: Positive for chest pain. Negative for palpitations.  Gastrointestinal: Positive for abdominal pain, nausea and vomiting.  Genitourinary: Positive for dysuria. Negative for hematuria.  Musculoskeletal: Positive for back pain. Negative for arthralgias.  Skin: Negative for color change and rash.  Neurological: Negative for seizures and syncope.  All other systems reviewed and are negative.    Physical Exam Updated Vital Signs BP (!) 137/54 (BP Location: Left Arm)   Pulse 83   Temp 98.2 F (36.8 C) (Oral)   Resp 18   SpO2 93%   Physical Exam  Constitutional: She appears well-developed and well-nourished. No distress.  HENT:  Head: Normocephalic  and atraumatic.  Eyes: Conjunctivae are normal.  Neck: Neck supple.  Cardiovascular: Normal rate and regular rhythm.  No murmur heard. Pulmonary/Chest: Effort normal. No respiratory distress. She has wheezes (scattered).  Abdominal: Soft. There is no tenderness.  Musculoskeletal: She exhibits no edema.       Right lower leg: Normal. She exhibits no tenderness.       Left lower leg: Normal. She exhibits no tenderness.  Neurological: She is alert.  Skin: Skin is warm and dry.  Psychiatric: She has a normal mood and affect.  Nursing note and vitals reviewed.    ED Treatments / Results  Labs (all labs ordered are listed, but only abnormal results are displayed) Labs Reviewed  BASIC METABOLIC PANEL - Abnormal; Notable for the following components:      Result Value   Glucose, Bld 168 (*)    BUN 25 (*)    Creatinine, Ser 1.30 (*)    GFR calc non Af Amer 46 (*)    GFR calc Af Amer 53 (*)    All other components within normal limits  CBC - Abnormal; Notable for the following components:   WBC 10.8 (*)    Hemoglobin 11.8 (*)    MCH 25.8 (*)    RDW 17.2 (*)    All other components within normal limits  BRAIN NATRIURETIC PEPTIDE  URINALYSIS, ROUTINE W REFLEX MICROSCOPIC  I-STAT TROPONIN, ED  I-STAT BETA HCG BLOOD, ED ([facility], [facility], [facility] ONLY)  I-STAT TROPONIN, ED    EKG  EKG Interpretation  Date/Time:  Wednesday October 20 2017 23:13:00 EDT Ventricular Rate:  91 PR Interval:    QRS Duration: 96 QT Interval:  362 QTC Calculation: 446 R Axis:   28 Text Interpretation:  Sinus rhythm Low voltage, precordial leads Consider anterior infarct Significant baseline wander Within limitations, no significant change from prior Confirmed by Duffy Bruce [phone]) on 10/21/2017 3:22:06 AM Also confirmed by Duffy Bruce [phone]), editor Hattie Perch ([id number])  on 10/21/2017 7:21:49 AM       Radiology Dg Chest 2 View  Result Date: 10/21/2017 CLINICAL DATA:  Acute onset of mid chest pain  and shortness of breath.  EXAM: CHEST - 2 VIEW COMPARISON:  Chest radiograph performed 02/14/2017 FINDINGS: The lungs are well-aerated. Mild vascular congestion is noted. There is no evidence of focal opacification, pleural effusion or pneumothorax. The heart is normal in size; the mediastinal contour is within normal limits. No acute osseous abnormalities are seen. IMPRESSION: Mild vascular congestion.  Lungs remain grossly clear. Electronically Signed   By: Garald Balding M.D.   On: 10/21/2017 00:15    Procedures Procedures (including critical care time)  Medications Ordered in ED Medications  ipratropium-albuterol (DUONEB) 0.5-2.5 (3) MG/3ML nebulizer solution 3 mL (not administered)     Initial Impression / Assessment and Plan / ED Course  I have reviewed the triage vital signs and the nursing notes.  Pertinent labs & imaging results that were available during my care of the patient were reviewed by me and considered in my medical decision making (see chart for details).  Clinical Course as of Oct 23 1904  Thu Oct 21, 2017  0913 Second troponin negative.  With ongoing pain in benign EKG she otherwise could probably be discharged to follow-up with her PCP for further cardiac workup. HEART score 2.   [MB]    Clinical Course User Index [MB] Hayden Rasmussen, MD     Final Clinical Impressions(s) / ED Diagnoses   Final diagnoses:  Chest pain in adult  Acute bronchitis, unspecified organism    ED Discharge Orders        Ordered    azithromycin (ZITHROMAX) 250 MG tablet  Daily     10/21/17 0950       Hayden Rasmussen, MD 10/23/17 1907

## 2017-10-25 ENCOUNTER — Ambulatory Visit: Payer: Medicare Other | Admitting: Family Medicine

## 2017-10-25 DIAGNOSIS — G629 Polyneuropathy, unspecified: Secondary | ICD-10-CM | POA: Insufficient documentation

## 2017-10-25 DIAGNOSIS — Z0289 Encounter for other administrative examinations: Secondary | ICD-10-CM

## 2017-10-25 NOTE — Progress Notes (Deleted)
HPI:   [name] is a 52 y.o. female, who is here today for 4 months follow up.   She was last seen on 06/25/2017.  Since her last OV she has been in the ER, 10/21/2017 because of chest pain and respiratory symptoms.  Diabetes Mellitus II:   15+ years Hx.  Currently on ***.  Checking BS's : *** Hypoglycemia:  *** is tolerating medications well. *** denies abdominal pain, nausea, vomiting, polydipsia, polyuria, or polyphagia.      Lab Results  Component Value Date   CREATININE 1.30 (H) 10/21/2017   BUN 25 (H) 10/21/2017   NA 140 10/21/2017   K 4.2 10/21/2017   CL 101 10/21/2017   CO2 25 10/21/2017    Lab Results  Component Value Date   HGBA1C 6.4 06/25/2017   Lab Results  Component Value Date   MICROALBUR 1.4 03/19/2017   Peripheral neuropathy and radicular pain: History of fibromyalgia and chronic back pain. She follows with [facility]. ***  Currently she is on Lyrica 200 mg 3 times per day.  Hypertension: Diagnosed about 15-16 years ago. Currently she is on Lisinopril-HCTZ 20-25 mg daily.    {4+ HPI elements (or status of 3 or more chronic diseases)} ***      Review of Systems  [review of 2 to 9 systems] ***  Current Outpatient Medications on File Prior to Visit  Medication Sig Dispense Refill  . ADVAIR DISKUS 250-50 MCG/DOSE AEPB INHALE 1 PUFF 2 (TWO) TIMES DAILY INTO THE LUNGS. 60 each 3  . albuterol (PROVENTIL HFA;VENTOLIN HFA) 108 (90 Base) MCG/ACT inhaler Inhale 2 puffs every 6 (six) hours as needed into the lungs for wheezing or shortness of breath. 18 g 4  . ALPRAZolam (XANAX) 1 MG tablet Take 1 mg by mouth 2 (two) times daily as needed for anxiety.     Marland Kitchen atorvastatin (LIPITOR) 20 MG tablet Take 1 tablet (20 mg total) daily by mouth. 90 tablet 2  . azithromycin (ZITHROMAX) 250 MG tablet Take 1 tablet (250 mg total) by mouth daily. Take first 2 tablets together, then 1 every day until finished. 6 tablet  0  . cyclobenzaprine (FLEXERIL) 10 MG tablet Take 10 mg by mouth 3 (three) times daily as needed for muscle spasms.    . fluticasone (FLONASE) 50 MCG/ACT nasal spray Place 1 spray into both nostrils daily.    Marland Kitchen glucose blood test strip Use as instructed 100 each 12  . insulin NPH-regular Human (NOVOLIN 70/30) (70-30) 100 UNIT/ML injection Inject 12 Units into the skin 2 (two) times daily with a meal. 10 mL 2  . lisinopril-hydrochlorothiazide (PRINZIDE,ZESTORETIC) 20-25 MG tablet Take 1 tablet by mouth daily. 90 tablet 2  . LYRICA 200 MG capsule TAKE 1 CAPSULE BY MOUTH THREE TIMES A DAY 90 capsule 1  . metFORMIN (GLUCOPHAGE) 850 MG tablet Take 1 tablet (850 mg total) by mouth 2 (two) times daily with a meal. 180 tablet 2  . nystatin (NYSTATIN) powder Apply 4 (four) times daily topically. (Patient not taking: Reported on 10/21/2017) 15 g 6  . omeprazole (PRILOSEC) 20 MG capsule TAKE 1 CAPSULE (20 MG TOTAL) BY MOUTH DAILY BEFORE BREAKFAST. 90 capsule 2  . pregabalin (LYRICA) 200 MG capsule Take 1 capsule (200 mg total) by mouth daily. (Patient not taking: Reported on 10/21/2017) 90 capsule 1   No current facility-administered medications on file prior to visit.      Past Medical History:  Diagnosis  Date  . Anemia   . Anxiety   . Arthritis   . Asthma   . Chronic pain   . Diabetes mellitus    type 2  . Family history of adverse reaction to anesthesia    youngest daughter hard to wake up both daughters post op n/v  . Fibromyalgia   . GERD (gastroesophageal reflux disease)   . Headache    migraines  . History of kidney stones    2 big stones in left kidney now  . History of recurrent UTIs    last uti 3 weeks ago  . Hyperlipidemia   . Hypertension   . IBS (irritable bowel syndrome)   . Obesities, morbid ([place])   . Pneumonia 06/2016  . PONV (postoperative nausea and vomiting) 2004   came back after hystectomy bowels locked up on life support for 1 month   . Sinusitis   . 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 Wound discharge    open wound right leg going to wound center changes dressing qday with santyl cream quarter size wound   Allergies  Allergen Reactions  . Darvocet [Propoxyphene N-Acetaminophen] Nausea And Vomiting  . Morphine And Related Itching  . Penicillins Hives and Swelling    Tolerates Rocephin  Swelling all over body  Has patient had a PCN reaction causing immediate rash, facial/tongue/throat swelling, SOB or lightheadedness with hypotension: Yes Has patient had a PCN reaction causing severe rash involving mucus membranes or skin necrosis: Yes Has patient had a PCN reaction that required hospitalization Unknown Has patient had a PCN reaction occurring within the last 10 years: No  If all of the above answers are "NO", then may proceed with Cephalosporin use.   . Tape Hives    Adhesive tape    Social History   Socioeconomic History  . Marital status: Married    Spouse name: Jenny Reichmann  . Number of children: 3  . Years of education: 84  . Highest education level: Not on file  Social Needs  . Financial resource strain: Not on file  . Food insecurity - worry: Not on file  . Food insecurity - inability: Not on file  . Transportation needs - medical: Not on file  . Transportation needs - non-medical: Not on file  Occupational History  . Occupation: Disabled  Tobacco Use  . Smoking status: Current Every Day Smoker    Packs/day: 0.50    Years: 33.00    Pack years: 16.50    Types: Cigarettes  . Smokeless tobacco: Never Used  Substance and Sexual Activity  . Alcohol use: No  . Drug use: No  . Sexual activity: Yes    Birth control/protection: None  Other Topics Concern  . Not on file  Social History Narrative   Smokes 1/2 ppd. Lives with husband and son.  Unemployed/disabled.  Independent of ADLs.    There were no vitals filed for this visit. There is no height or weight on file to calculate BMI.      Physical  Exam  {[12+ exam elements]} ***  ASSESSMENT AND PLAN:   [name] was seen today for *** months follow-up.  No orders of the defined types were placed in this encounter.   No problem-specific Assessment & Plan notes found for this encounter.           -[name] was advised to return sooner than planned today  if new concerns arise.       Betty G. Martinique, MD  [facility]. [place] office.

## 2017-10-29 ENCOUNTER — Other Ambulatory Visit: Payer: Self-pay | Admitting: Family Medicine

## 2017-10-29 DIAGNOSIS — E1140 Type 2 diabetes mellitus with diabetic neuropathy, unspecified: Secondary | ICD-10-CM

## 2017-10-29 DIAGNOSIS — Z794 Long term (current) use of insulin: Principal | ICD-10-CM

## 2017-11-17 ENCOUNTER — Other Ambulatory Visit: Payer: Self-pay | Admitting: Family Medicine

## 2017-11-17 DIAGNOSIS — J4520 Mild intermittent asthma, uncomplicated: Secondary | ICD-10-CM

## 2017-11-22 ENCOUNTER — Other Ambulatory Visit: Payer: Self-pay | Admitting: Family Medicine

## 2017-11-22 DIAGNOSIS — E1140 Type 2 diabetes mellitus with diabetic neuropathy, unspecified: Secondary | ICD-10-CM

## 2017-11-22 DIAGNOSIS — Z794 Long term (current) use of insulin: Principal | ICD-10-CM

## 2018-01-11 ENCOUNTER — Other Ambulatory Visit: Payer: Self-pay | Admitting: Family Medicine

## 2018-01-11 DIAGNOSIS — Z794 Long term (current) use of insulin: Principal | ICD-10-CM

## 2018-01-11 DIAGNOSIS — E1140 Type 2 diabetes mellitus with diabetic neuropathy, unspecified: Secondary | ICD-10-CM

## 2018-01-13 ENCOUNTER — Other Ambulatory Visit: Payer: Self-pay | Admitting: Family Medicine

## 2018-01-13 NOTE — Telephone Encounter (Signed)
Copied from [place] [phone]. Topic: Quick Communication - Rx Refill/Question >> Jan 13, 2018 12:14 PM Carolyn Stare wrote: Medication LYRICA 200 MG capsule  Has the patient contacted their pharmacy  yes   Preferred Pharmacy  [facility]   [facility]   Agent: Please be advised that RX refills may take up to 3 business days. We ask that you follow-up with your pharmacy.

## 2018-01-13 NOTE — Telephone Encounter (Signed)
Lyrica 200mg  cap refill Last Refill:07/27/17 #90 with 1 refill Last OV: 06/25/17 PCP: Dr. Martinique [facility]

## 2018-01-14 NOTE — Telephone Encounter (Signed)
Please advise Dr Martinique. Thanks (reply to [place] )

## 2018-01-17 ENCOUNTER — Ambulatory Visit (INDEPENDENT_AMBULATORY_CARE_PROVIDER_SITE_OTHER): Payer: Medicare Other | Admitting: Family Medicine

## 2018-01-17 ENCOUNTER — Encounter: Payer: Self-pay | Admitting: Family Medicine

## 2018-01-17 ENCOUNTER — Ambulatory Visit (HOSPITAL_COMMUNITY)
Admission: RE | Admit: 2018-01-17 | Discharge: 2018-01-17 | Disposition: A | Discharge status: Home / Self Care | Payer: Medicare Other | Source: Ambulatory Visit | Attending: Family Medicine | Admitting: Family Medicine

## 2018-01-17 VITALS — BP 135/70 | HR 71 | Temp 98.5°F | Resp 16 | Ht 64.0 in | Wt 317.1 lb

## 2018-01-17 DIAGNOSIS — G629 Polyneuropathy, unspecified: Secondary | ICD-10-CM | POA: Diagnosis not present

## 2018-01-17 DIAGNOSIS — G8929 Other chronic pain: Secondary | ICD-10-CM | POA: Diagnosis not present

## 2018-01-17 DIAGNOSIS — K429 Umbilical hernia without obstruction or gangrene: Secondary | ICD-10-CM | POA: Diagnosis not present

## 2018-01-17 DIAGNOSIS — I700 Atherosclerosis of aorta: Secondary | ICD-10-CM | POA: Diagnosis not present

## 2018-01-17 DIAGNOSIS — R1030 Lower abdominal pain, unspecified: Secondary | ICD-10-CM

## 2018-01-17 DIAGNOSIS — M5440 Lumbago with sciatica, unspecified side: Secondary | ICD-10-CM

## 2018-01-17 DIAGNOSIS — N200 Calculus of kidney: Secondary | ICD-10-CM | POA: Insufficient documentation

## 2018-01-17 DIAGNOSIS — R310 Gross hematuria: Secondary | ICD-10-CM | POA: Insufficient documentation

## 2018-01-17 DIAGNOSIS — R112 Nausea with vomiting, unspecified: Secondary | ICD-10-CM | POA: Diagnosis not present

## 2018-01-17 DIAGNOSIS — L304 Erythema intertrigo: Secondary | ICD-10-CM | POA: Diagnosis not present

## 2018-01-17 DIAGNOSIS — G894 Chronic pain syndrome: Secondary | ICD-10-CM

## 2018-01-17 LAB — POCT URINALYSIS DIPSTICK
BILIRUBIN UA: NEGATIVE
Blood, UA: POSITIVE
GLUCOSE UA: NEGATIVE
Ketones, UA: NEGATIVE
Nitrite, UA: NEGATIVE
Protein, UA: POSITIVE — AB
Spec Grav, UA: 1.015 (ref 1.010–1.025)
Urobilinogen, UA: 0.2 E.U./dL
pH, UA: 6 (ref 5.0–8.0)

## 2018-01-17 LAB — CBC WITH DIFFERENTIAL/PLATELET
BASOS PCT: 0.4 % (ref 0.0–3.0)
Basophils Absolute: 0 10*3/uL (ref 0.0–0.1)
EOS PCT: 8.8 % — AB (ref 0.0–5.0)
Eosinophils Absolute: 0.8 10*3/uL — ABNORMAL HIGH (ref 0.0–0.7)
HCT: 34.9 % — ABNORMAL LOW (ref 36.0–46.0)
Hemoglobin: 11.3 g/dL — ABNORMAL LOW (ref 12.0–15.0)
LYMPHS ABS: 1.9 10*3/uL (ref 0.7–4.0)
Lymphocytes Relative: 21.6 % (ref 12.0–46.0)
MCHC: 32.3 g/dL (ref 30.0–36.0)
MCV: 78.7 fl (ref 78.0–100.0)
MONO ABS: 0.4 10*3/uL (ref 0.1–1.0)
MONOS PCT: 4.2 % (ref 3.0–12.0)
NEUTROS PCT: 65 % (ref 43.0–77.0)
Neutro Abs: 5.7 10*3/uL (ref 1.4–7.7)
Platelets: 296 10*3/uL (ref 150.0–400.0)
RBC: 4.44 Mil/uL (ref 3.87–5.11)
RDW: 18.2 % — AB (ref 11.5–15.5)
WBC: 8.7 10*3/uL (ref 4.0–10.5)

## 2018-01-17 LAB — BASIC METABOLIC PANEL
BUN: 12 mg/dL (ref 6–23)
CHLORIDE: 100 meq/L (ref 96–112)
CO2: 32 meq/L (ref 19–32)
Calcium: 10 mg/dL (ref 8.4–10.5)
Creatinine, Ser: 0.86 mg/dL (ref 0.40–1.20)
GFR: 73.18 mL/min (ref >60.00)
GLUCOSE: 91 mg/dL (ref 70–99)
POTASSIUM: 4.8 meq/L (ref 3.5–5.1)
Sodium: 140 mEq/L (ref 135–145)

## 2018-01-17 MED ORDER — NYSTATIN 100000 UNIT/GM EX POWD: Freq: Three times a day (TID) | CUTANEOUS | 6 refills | Status: AC | PRN

## 2018-01-17 MED ORDER — PREGABALIN 200 MG PO CAPS: ORAL_CAPSULE | ORAL | 2 refills | Status: AC

## 2018-01-17 NOTE — Assessment & Plan Note (Signed)
Referral to pain management was placed as requested. For now recommend taking OTC Tylenol 650 mg 3 times per day. Topical OTC IcyHot with lidocaine may also help. Caution with NSAIDs due to mildly elevated creatinine in 10/21/2017.

## 2018-01-17 NOTE — Progress Notes (Signed)
ACUTE VISIT   HPI:  Chief Complaint  Patient presents with  . Back Pain    lower back, radiates to stomach, started over 1 week ago    [name] is a 52 y.o. female, who is here today complaining of severe back pain. Back pain radiates to lower abdomen,pressure/stubbing/shooting,constant pain. Sometimes is also sharp.   Pain is exacerbated by movement. She has not identified exacerbating factors.  She was in the ER complaining of chest pain, 10/21/2017. A couple days ago she was in acute care, placed on Azithromycin to treat sinus infection,last dose today.  States that she is still having fever intermittently for a week,today 61 F She took Ibuprofen today. No sick contact or recent travel.  Nausea,vomiting,and diarrhea.  Last bowel movement today, has had diarrhea for the past 2-3 weeks.  She has had about 4-5 episodes of vomiting.   She is also complaining of lower extremity cramps.  + Odorous and dark urine,she thinks she had blood in urine but states that she is not sure.   Denies dysuria,increased urinary frequency, or decreased urine output.  She has history of nephrolithiasis and thinks symptoms are caused by an acute episode.  She follows with urologist regularly.    She also has history of peripheral neuropathy, currently she is on Lyrica 200 mg tid.Symptoms stable, pain got worse when dose was decreased to bid.  History of back pain, radiated to both lower extremities. She has followed with [facility] and has reported having epidural injections, which did not help.  She is requesting referral to pain management [place]  Hypertension:   Currently on Lisinopril-HCTZ 20-25 mg daily.     She is taking medications as instructed, no side effects reported.  She has not noted unusual headache, visual changes, exertional chest pain, dyspnea,  focal weakness, or worsening edema.   Lab Results  Component Value  Date   CREATININE 1.30 (H) 10/21/2017   BUN 25 (H) 10/21/2017   NA 140 10/21/2017   K 4.2 10/21/2017   CL 101 10/21/2017   CO2 25 10/21/2017    Nystatin powder has helped with lower abdominal rash.   Review of Systems  Constitutional: Positive for activity change and fatigue. Negative for appetite change and fever.  HENT: Negative for mouth sores, nosebleeds and trouble swallowing.   Eyes: Negative for redness and visual disturbance.  Respiratory: Negative for cough, shortness of breath and wheezing.   Cardiovascular: Negative for chest pain and palpitations.  Gastrointestinal: Positive for diarrhea, nausea and vomiting. Negative for abdominal pain and blood in stool.  Genitourinary: Negative for decreased urine volume, difficulty urinating, dysuria and hematuria.  Musculoskeletal: Positive for back pain. Negative for joint swelling.  Skin: Negative for rash and wound.  Neurological: Positive for numbness. Negative for syncope, weakness and headaches.  Psychiatric/Behavioral: Negative for confusion. The patient is nervous/anxious.       Current Outpatient Medications on File Prior to Visit  Medication Sig Dispense Refill  . ADVAIR DISKUS 250-50 MCG/DOSE AEPB INHALE 1 PUFF 2 (TWO) TIMES DAILY INTO THE LUNGS. 60 each 3  . ALPRAZolam (XANAX) 1 MG tablet Take 1 mg by mouth 2 (two) times daily as needed for anxiety.     Marland Kitchen atorvastatin (LIPITOR) 20 MG tablet Take 1 tablet (20 mg total) daily by mouth. 90 tablet 2  . azithromycin (ZITHROMAX) 250 MG tablet Take 1 tablet (250 mg total) by mouth daily. Take first 2 tablets  together, then 1 every day until finished. 6 tablet 0  . cyclobenzaprine (FLEXERIL) 10 MG tablet Take 10 mg by mouth 3 (three) times daily as needed for muscle spasms.    . fluticasone (FLONASE) 50 MCG/ACT nasal spray Place 1 spray into both nostrils daily.    Marland Kitchen glucose blood test strip Use as instructed 100 each 12  . lisinopril-hydrochlorothiazide (PRINZIDE,ZESTORETIC)  20-25 MG tablet Take 1 tablet by mouth daily. 90 tablet 2  . metFORMIN (GLUCOPHAGE) 850 MG tablet Take 1 tablet (850 mg total) by mouth 2 (two) times daily with a meal. 180 tablet 2  . NOVOLOG MIX 70/30 (70-30) 100 UNIT/ML injection     . omeprazole (PRILOSEC) 20 MG capsule TAKE 1 CAPSULE (20 MG TOTAL) BY MOUTH DAILY BEFORE BREAKFAST. 90 capsule 2  . promethazine (PHENERGAN) 25 MG tablet Take by mouth.    . valACYclovir (VALTREX) 500 MG tablet Take by mouth.    . VENTOLIN HFA 108 (90 Base) MCG/ACT inhaler INHALE 2 PUFFS EVERY 6 (SIX) HOURS AS NEEDED INTO THE LUNGS FOR WHEEZING OR SHORTNESS OF BREATH. 18 Inhaler 4   No current facility-administered medications on file prior to visit.      Past Medical History:  Diagnosis Date  . Anemia   . Anxiety   . Arthritis   . Asthma   . Chronic pain   . Diabetes mellitus    type 2  . Family history of adverse reaction to anesthesia    youngest daughter hard to wake up both daughters post op n/v  . Fibromyalgia   . GERD (gastroesophageal reflux disease)   . Headache    migraines  . History of kidney stones    2 big stones in left kidney now  . History of recurrent UTIs    last uti 3 weeks ago  . Hyperlipidemia   . Hypertension   . IBS (irritable bowel syndrome)   . Obesities, morbid ([place])   . Pneumonia 06/2016  . PONV (postoperative nausea and vomiting) 2004   came back after hystectomy bowels locked up on life support for 1 month   . Sinusitis   . 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 Wound discharge    open wound right leg going to wound center changes dressing qday with santyl cream quarter size wound   Allergies  Allergen Reactions  . Darvocet [Propoxyphene N-Acetaminophen] Nausea And Vomiting  . Morphine And Related Itching  . Penicillins Hives and Swelling    Tolerates Rocephin  Swelling all over body  Has patient had a PCN reaction causing immediate rash, facial/tongue/throat swelling, SOB or  lightheadedness with hypotension: Yes Has patient had a PCN reaction causing severe rash involving mucus membranes or skin necrosis: Yes Has patient had a PCN reaction that required hospitalization Unknown Has patient had a PCN reaction occurring within the last 10 years: No  If all of the above answers are "NO", then may proceed with Cephalosporin use.   . Tape Hives    Adhesive tape    Social History   Socioeconomic History  . Marital status: Married    Spouse name: Jenny Reichmann  . Number of children: 3  . Years of education: 21  . Highest education level: Not on file  Occupational History  . Occupation: Disabled  Social Needs  . Financial resource strain: Not on file  . Food insecurity:    Worry: Not on file    Inability: Not  on file  . Transportation needs:    Medical: Not on file    Non-medical: Not on file  Tobacco Use  . Smoking status: Current Every Day Smoker    Packs/day: 0.50    Years: 33.00    Pack years: 16.50    Types: Cigarettes  . Smokeless tobacco: Never Used  Substance and Sexual Activity  . Alcohol use: No  . Drug use: No  . Sexual activity: Yes    Birth control/protection: None  Lifestyle  . Physical activity:    Days per week: Not on file    Minutes per session: Not on file  . Stress: Not on file  Relationships  . Social connections:    Talks on phone: Not on file    Gets together: Not on file    Attends religious service: Not on file    Active member of club or organization: Not on file    Attends meetings of clubs or organizations: Not on file    Relationship status: Not on file  Other Topics Concern  . Not on file  Social History Narrative   Smokes 1/2 ppd. Lives with husband and son.  Unemployed/disabled.  Independent of ADLs.    Vitals:   01/17/18 1119  BP: 135/70  Pulse: 71  Resp: 16  Temp: 98.5 F (36.9 C)  SpO2: 97%   Body mass index is 54.43 kg/m.    Physical Exam  Nursing note and vitals reviewed. Constitutional: She is  oriented to person, place, and time. She appears well-developed. No distress.  HENT:  Head: Normocephalic and atraumatic.  Mouth/Throat: Oropharynx is clear and moist and mucous membranes are normal.  Eyes: Conjunctivae are normal.  Cardiovascular: Normal rate and regular rhythm.  No murmur heard. Pulses:      Dorsalis pedis pulses are 2+ on the right side, and 2+ on the left side.  Respiratory: Effort normal and breath sounds normal. No respiratory distress.  GI: Soft. She exhibits no mass. There is no hepatomegaly. There is no tenderness.  Musculoskeletal: She exhibits edema (1+ pitting LE edema,bilateral.).       Lumbar back: She exhibits tenderness. She exhibits no bony tenderness.       Back:  Tenderness upon palpation of lumbar paraspinal muscles bilateral.  Lymphadenopathy:    She has no cervical adenopathy.  Neurological: She is alert and oriented to person, place, and time. She has normal strength.  Antalgic gate.  Skin: Skin is warm. No rash noted. No erythema.  Lower abdomen with hyperpigmentation changes,no erythema. No induration or tenderness.  Psychiatric: Her mood appears anxious.  Well groomed, good eye contact.      ASSESSMENT AND PLAN:   Ms. Madinah was seen today for back pain.  Diagnoses and all orders for this visit:  Lab Results  Component Value Date   CREATININE 0.86 01/17/2018   BUN 12 01/17/2018   NA 140 01/17/2018   K 4.8 01/17/2018   CL 100 01/17/2018   CO2 32 01/17/2018   Lab Results  Component Value Date   WBC 8.7 01/17/2018   HGB 11.3 (L) 01/17/2018   HCT 34.9 (L) 01/17/2018   MCV 78.7 01/17/2018   PLT 296.0 01/17/2018    Chronic bilateral low back pain with sciatica, sciatica laterality unspecified  Seems musculoskeletal related. Other possible causes discussed. Renal CT will be arranged to evaluate for nephrolithiasis.  She has Hx of chronic back pain.  -     POC Urinalysis Dipstick -  Ambulatory referral to Pain  Clinic  Peripheral polyneuropathy  Stable. No changes in Lyrica dose. F/U in 4 months.  -     pregabalin (LYRICA) 200 MG capsule; TAKE 1 CAPSULE BY MOUTH THREE TIMES A DAY  Lower abdominal pain  Ln examination today pain was not reproducible upon abdominal palpation. ? Radicular pain. ? Nephrolithiasis. She has no dysuria or increased in frequency. Further recommendations will be given according to labs/imaging results. Instructed about warning signs.  -     Basic metabolic panel -     CBC with Differential/Platelet -     CT RENAL STONE STUDY; Future  Nausea and vomiting in adult  Has had problem in the past. Small and frequent sips of clear fluids.  Hematuria, gross -     CT RENAL STONE STUDY; Future   Intertrigo Nystatin powder is helping, no changes recorded management. Trying to keep area dry and use of antibacterial soap was recommended.   Chronic pain disorder Referral to pain management was placed as requested. For now recommend taking OTC Tylenol 650 mg 3 times per day. Topical OTC IcyHot with lidocaine may also help. Caution with NSAIDs due to mildly elevated creatinine in 10/21/2017.       Return in about 4 months (around 05/19/2018) for DM II.          Keishaun Hazel G. Martinique, MD  [facility]. [place] office.

## 2018-01-17 NOTE — Patient Instructions (Addendum)
A few things to remember from today's visit:   Chronic bilateral low back pain with sciatica, sciatica laterality unspecified - Plan: POC Urinalysis Dipstick, Ambulatory referral to Pain Clinic  Peripheral polyneuropathy - Plan: POC Urinalysis Dipstick, pregabalin (LYRICA) 200 MG capsule  Chronic pain disorder - Plan: Ambulatory referral to Pain Clinic  Intertrigo - Plan: nystatin (NYSTATIN) powder  Lower abdominal pain - Plan: Basic metabolic panel, CBC with Differential/Platelet  Nausea and vomiting in adult  Referral to pain management was placed as requested.  Please be sure medication list is accurate. If a new problem present, please set up appointment sooner than planned today.

## 2018-01-17 NOTE — Assessment & Plan Note (Signed)
Nystatin powder is helping, no changes recorded management. Trying to keep area dry and use of antibacterial soap was recommended.

## 2018-01-18 NOTE — Progress Notes (Signed)
Patient informed of CT results and verbalized understanding.

## 2018-01-20 ENCOUNTER — Telehealth: Payer: Self-pay | Admitting: Family Medicine

## 2018-01-20 NOTE — Telephone Encounter (Signed)
Copied from [place] [id number] [id number]. Topic: Quick Communication - Lab Results >> Jan 20, 2018  9:30 AM Boyd Kerbs wrote:   Pt -  she went to urgent care and they did labs  What they told her was:   They put her on Septra Has ecoli in kidneys  They told her if she throws up or bad pain she has to go to ER right away

## 2018-01-20 NOTE — Telephone Encounter (Signed)
Copied from [place] [id number]. Topic: Quick Communication - See Telephone Encounter >> Jan 20, 2018  9:37 AM Boyd Kerbs wrote: CRM for notification. See Telephone encounter for: 01/20/18.  Pt -  she went to urgent care and they did labs  What they told her was:   They put her on Septra Has ecoli in kidneys  They told her if she throws up or bad pain she has to go to ER right away

## 2018-01-21 ENCOUNTER — Telehealth: Payer: Self-pay | Admitting: Family Medicine

## 2018-01-21 NOTE — Telephone Encounter (Signed)
Patient given instructions per Dr. Martinique. Patient stated that she would call back on Monday with the name of the pain clinic. Patient stated that if the pain gets worse than what it is already, she is going to the ER.

## 2018-01-21 NOTE — Telephone Encounter (Signed)
Message sent to Dr. Jordan for review. 

## 2018-01-21 NOTE — Telephone Encounter (Addendum)
She has Hx of chronic pain,[she was following with spine specialist and stopped doing so]. I prefer for pain clinic to start with appropriate treatment.  It seems like she was started on abx again to treat UTI.  Thanks, BJ

## 2018-01-21 NOTE — Telephone Encounter (Signed)
I called the pt this morning to find out which pain clinic she wanted to be seen at in [place] pt states she would look into it  and call back.  Pt also wanted to know if Dr Martinique called in a pain medication for her . She states  She still in pain   Would like a call from the [place] to advise .

## 2018-01-21 NOTE — Telephone Encounter (Signed)
Spoke with patient and she wanted to know if she could get something for pain. She stated that she has been taking Tylenol, BC, and Ibuprofen. She stated that she has taking Tramadol in the past for pain.

## 2018-01-24 ENCOUNTER — Telehealth: Payer: Self-pay | Admitting: Family Medicine

## 2018-01-24 NOTE — Telephone Encounter (Signed)
Message sent to referral coordinator for [facility].

## 2018-01-24 NOTE — Telephone Encounter (Signed)
Copied from [place]. Topic: General - Other >> Jan 24, 2018 10:25 AM Cecelia Byars, NT wrote: Reason for CRM: Patient called and said the name of the  pain clinic is  [facility] , please call them at  [phone] to schedule her appointement

## 2018-01-24 NOTE — Telephone Encounter (Signed)
See TE 01/21/18 for more information

## 2018-02-13 ENCOUNTER — Other Ambulatory Visit: Payer: Self-pay | Admitting: Family Medicine

## 2018-02-13 DIAGNOSIS — J4520 Mild intermittent asthma, uncomplicated: Secondary | ICD-10-CM

## 2018-03-17 ENCOUNTER — Other Ambulatory Visit: Payer: Self-pay | Admitting: Family Medicine

## 2018-03-17 DIAGNOSIS — E785 Hyperlipidemia, unspecified: Secondary | ICD-10-CM

## 2018-03-24 ENCOUNTER — Other Ambulatory Visit: Payer: Self-pay | Admitting: Family Medicine

## 2018-03-24 DIAGNOSIS — I10 Essential (primary) hypertension: Secondary | ICD-10-CM

## 2018-03-24 DIAGNOSIS — E1140 Type 2 diabetes mellitus with diabetic neuropathy, unspecified: Secondary | ICD-10-CM

## 2018-03-24 DIAGNOSIS — Z794 Long term (current) use of insulin: Secondary | ICD-10-CM

## 2018-05-02 ENCOUNTER — Other Ambulatory Visit: Payer: Self-pay | Admitting: Family Medicine

## 2018-05-02 DIAGNOSIS — Z794 Long term (current) use of insulin: Principal | ICD-10-CM

## 2018-05-02 DIAGNOSIS — E1140 Type 2 diabetes mellitus with diabetic neuropathy, unspecified: Secondary | ICD-10-CM

## 2018-05-03 ENCOUNTER — Other Ambulatory Visit: Payer: Self-pay | Admitting: *Deleted

## 2018-05-23 ENCOUNTER — Ambulatory Visit: Payer: Medicare Other | Admitting: Family Medicine

## 2018-05-23 DIAGNOSIS — Z0289 Encounter for other administrative examinations: Secondary | ICD-10-CM

## 2018-05-23 NOTE — Progress Notes (Deleted)
HPI:   [name] is a 52 y.o. female, who is here today for *** months follow up.   She was last seen in 01/2018.  Since *** last OV she has ***   {4+ HPI elements (or status of 3 or more chronic diseases)} ***   Diabetes Mellitus II:    Currently on ***.  Checking BS's : *** Hypoglycemia:  *** is tolerating medications well. *** denies abdominal pain, nausea, vomiting, polydipsia, polyuria, or polyphagia. ***numbness, tingling, or burning.     Lab Results  Component Value Date   HGBA1C 6.4 06/25/2017   Lab Results  Component Value Date   MICROALBUR 1.4 03/19/2017   Peripheral neuropathy on Lyrica 200 mg tid. ***  Hypertension:    Currently on Lisinopril-HCTZ 20-25 mg daily.    ***taking medications as instructed, no side effects reported.  ***has not noted unusual headache, visual changes, exertional chest pain, dyspnea,  focal weakness, or edema.   Lab Results  Component Value Date   CREATININE 0.86 01/17/2018   BUN 12 01/17/2018   NA 140 01/17/2018   K 4.8 01/17/2018   CL 100 01/17/2018   CO2 32 01/17/2018    Hyperlipidemia:  Currently on Lipitor 20 mg daily. Following a low fat diet: ***.  *** has not noted side effects with medication.  No results found for: CHOL, HDL, LDLCALC, LDLDIRECT, TRIG, CHOLHDL       Review of Systems  [review of 2 to 9 systems] ***  Current Outpatient Medications on File Prior to Visit  Medication Sig Dispense Refill  . ADVAIR DISKUS 250-50 MCG/DOSE AEPB INHALE 1 PUFF 2 (TWO) TIMES DAILY INTO THE LUNGS. 60 each 3  . ALPRAZolam (XANAX) 1 MG tablet Take 1 mg by mouth 2 (two) times daily as needed for anxiety.     Marland Kitchen atorvastatin (LIPITOR) 20 MG tablet TAKE 1 TABLET (20 MG TOTAL) DAILY BY MOUTH. 90 tablet 2  . azithromycin (ZITHROMAX) 250 MG tablet Take 1 tablet (250 mg total) by mouth daily. Take first 2 tablets together, then 1 every day until finished. 6 tablet 0  . cyclobenzaprine  (FLEXERIL) 10 MG tablet Take 10 mg by mouth 3 (three) times daily as needed for muscle spasms.    . fluticasone (FLONASE) 50 MCG/ACT nasal spray Place 1 spray into both nostrils daily.    Marland Kitchen glucose blood test strip Use as instructed 100 each 12  . lisinopril-hydrochlorothiazide (PRINZIDE,ZESTORETIC) 20-25 MG tablet TAKE 1 TABLET BY MOUTH EVERY DAY 90 tablet 2  . metFORMIN (GLUCOPHAGE) 850 MG tablet TAKE 1 TABLET (850 MG TOTAL) BY MOUTH 2 (TWO) TIMES DAILY WITH A MEAL. 180 tablet 2  . NOVOLOG MIX 70/30 (70-30) 100 UNIT/ML injection     . nystatin (NYSTATIN) powder Apply topically 3 (three) times daily as needed. 60 g 6  . omeprazole (PRILOSEC) 20 MG capsule TAKE 1 CAPSULE (20 MG TOTAL) BY MOUTH DAILY BEFORE BREAKFAST. 90 capsule 2  . pregabalin (LYRICA) 200 MG capsule TAKE 1 CAPSULE BY MOUTH THREE TIMES A DAY 90 capsule 2  . promethazine (PHENERGAN) 25 MG tablet Take by mouth.    . VENTOLIN HFA 108 (90 Base) MCG/ACT inhaler INHALE 2 PUFFS EVERY 6 (SIX) HOURS AS NEEDED INTO THE LUNGS FOR WHEEZING OR SHORTNESS OF BREATH. 18 Inhaler 4   No current facility-administered medications on file prior to visit.      Past Medical History:  Diagnosis Date  . Anemia   . Anxiety   .  Arthritis   . Asthma   . Chronic pain   . Diabetes mellitus    type 2  . Family history of adverse reaction to anesthesia    youngest daughter hard to wake up both daughters post op n/v  . Fibromyalgia   . GERD (gastroesophageal reflux disease)   . Headache    migraines  . History of kidney stones    2 big stones in left kidney now  . History of recurrent UTIs    last uti 3 weeks ago  . Hyperlipidemia   . Hypertension   . IBS (irritable bowel syndrome)   . Obesities, morbid ([place])   . Pneumonia 06/2016  . PONV (postoperative nausea and vomiting) 2004   came back after hystectomy bowels locked up on life support for 1 month   . Sinusitis   . 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 Wound  discharge    open wound right leg going to wound center changes dressing qday with santyl cream quarter size wound   Allergies  Allergen Reactions  . Darvocet [Propoxyphene N-Acetaminophen] Nausea And Vomiting  . Morphine And Related Itching  . Penicillins Hives and Swelling    Tolerates Rocephin  Swelling all over body  Has patient had a PCN reaction causing immediate rash, facial/tongue/throat swelling, SOB or lightheadedness with hypotension: Yes Has patient had a PCN reaction causing severe rash involving mucus membranes or skin necrosis: Yes Has patient had a PCN reaction that required hospitalization Unknown Has patient had a PCN reaction occurring within the last 10 years: No  If all of the above answers are "NO", then may proceed with Cephalosporin use.   . Tape Hives    Adhesive tape    Social History   Socioeconomic History  . Marital status: Married    Spouse name: Jenny Reichmann  . Number of children: 3  . Years of education: 2  . Highest education level: Not on file  Occupational History  . Occupation: Disabled  Social Needs  . Financial resource strain: Not on file  . Food insecurity:    Worry: Not on file    Inability: Not on file  . Transportation needs:    Medical: Not on file    Non-medical: Not on file  Tobacco Use  . Smoking status: Current Every Day Smoker    Packs/day: 0.50    Years: 33.00    Pack years: 16.50    Types: Cigarettes  . Smokeless tobacco: Never Used  Substance and Sexual Activity  . Alcohol use: No  . Drug use: No  . Sexual activity: Yes    Birth control/protection: None  Lifestyle  . Physical activity:    Days per week: Not on file    Minutes per session: Not on file  . Stress: Not on file  Relationships  . Social connections:    Talks on phone: Not on file    Gets together: Not on file    Attends religious service: Not on file    Active member of club or organization: Not on file    Attends meetings of clubs or organizations:  Not on file    Relationship status: Not on file  Other Topics Concern  . Not on file  Social History Narrative   Smokes 1/2 ppd. Lives with husband and son.  Unemployed/disabled.  Independent of ADLs.    There were no vitals filed for this visit. There is no height  or weight on file to calculate BMI.      Physical Exam  {[12+ exam elements]} ***  ASSESSMENT AND PLAN:   [name] was seen today for *** months follow-up.  No orders of the defined types were placed in this encounter.   No problem-specific Assessment & Plan notes found for this encounter.           -[name] was advised to return sooner than planned today if new concerns arise.       Brynnan Rodenbaugh G. Martinique, MD  [facility]. [place] office.

## 2018-06-02 ENCOUNTER — Other Ambulatory Visit: Payer: Self-pay | Admitting: Family Medicine

## 2018-06-02 DIAGNOSIS — J4520 Mild intermittent asthma, uncomplicated: Secondary | ICD-10-CM

## 2018-06-08 DIAGNOSIS — E669 Obesity, unspecified: Secondary | ICD-10-CM

## 2018-06-08 DIAGNOSIS — E785 Hyperlipidemia, unspecified: Secondary | ICD-10-CM

## 2018-06-08 DIAGNOSIS — R079 Chest pain, unspecified: Secondary | ICD-10-CM

## 2018-06-09 DIAGNOSIS — E669 Obesity, unspecified: Secondary | ICD-10-CM | POA: Diagnosis not present

## 2018-06-09 DIAGNOSIS — E785 Hyperlipidemia, unspecified: Secondary | ICD-10-CM | POA: Diagnosis not present

## 2018-06-09 DIAGNOSIS — R079 Chest pain, unspecified: Secondary | ICD-10-CM | POA: Diagnosis not present

## 2018-06-14 ENCOUNTER — Other Ambulatory Visit: Payer: Self-pay | Admitting: Family Medicine

## 2018-06-14 DIAGNOSIS — Z794 Long term (current) use of insulin: Principal | ICD-10-CM

## 2018-06-14 DIAGNOSIS — E1140 Type 2 diabetes mellitus with diabetic neuropathy, unspecified: Secondary | ICD-10-CM

## 2018-06-16 MED ORDER — GLUCOSE BLOOD VI STRP: ORAL_STRIP | 3 refills | Status: AC

## 2018-06-16 NOTE — Addendum Note (Signed)
Addended by: Zacarias Pontes on: 06/16/2018 04:56 PM   Modules accepted: Orders

## 2018-06-28 ENCOUNTER — Ambulatory Visit: Payer: Medicare Other | Admitting: Gastroenterology

## 2018-08-17 ENCOUNTER — Other Ambulatory Visit: Payer: Self-pay | Admitting: Family Medicine

## 2018-08-17 DIAGNOSIS — K219 Gastro-esophageal reflux disease without esophagitis: Secondary | ICD-10-CM

## 2018-10-18 ENCOUNTER — Other Ambulatory Visit: Payer: Self-pay | Admitting: Family Medicine

## 2018-10-18 DIAGNOSIS — J4520 Mild intermittent asthma, uncomplicated: Secondary | ICD-10-CM

## 2018-11-24 ENCOUNTER — Telehealth: Payer: Self-pay | Admitting: *Deleted

## 2018-11-24 NOTE — Telephone Encounter (Signed)
[facility] requesting alternative: Can you please send in Rx for Advair, insurance will only cover brand now and this is a refill that cannot be changed to brand name since she got generic last month.

## 2018-11-25 NOTE — Telephone Encounter (Signed)
Last OV 01/2018 for back pain. Thanks, BJ

## 2018-11-25 NOTE — Telephone Encounter (Signed)
Spoke with patient and she stated that she did not request any refills of any inhalers. Nothing further needed at this time.

## 2018-12-22 ENCOUNTER — Other Ambulatory Visit: Payer: Self-pay | Admitting: *Deleted

## 2018-12-22 MED ORDER — FLUTICASONE-SALMETEROL 250-50 MCG/DOSE IN AEPB: 1.0000 | INHALATION_SPRAY | Freq: Two times a day (BID) | RESPIRATORY_TRACT | 1 refills | Status: DC

## 2019-01-07 ENCOUNTER — Other Ambulatory Visit: Payer: Self-pay | Admitting: Family Medicine

## 2019-01-07 DIAGNOSIS — Z794 Long term (current) use of insulin: Secondary | ICD-10-CM

## 2019-01-07 DIAGNOSIS — E1140 Type 2 diabetes mellitus with diabetic neuropathy, unspecified: Secondary | ICD-10-CM

## 2019-01-09 ENCOUNTER — Other Ambulatory Visit: Payer: Self-pay | Admitting: *Deleted

## 2019-01-11 ENCOUNTER — Other Ambulatory Visit: Payer: Self-pay | Admitting: *Deleted

## 2019-01-11 MED ORDER — NOVOLOG MIX 70/30 FLEXPEN (70-30) 100 UNIT/ML ~~LOC~~ SUPN: PEN_INJECTOR | SUBCUTANEOUS | 3 refills | Status: DC

## 2019-01-21 ENCOUNTER — Other Ambulatory Visit: Payer: Self-pay | Admitting: Family Medicine

## 2019-01-27 NOTE — Telephone Encounter (Signed)
Please advise. I do not see this on the medication list.

## 2019-03-31 ENCOUNTER — Ambulatory Visit: Payer: Medicare Other | Admitting: Gastroenterology

## 2019-05-24 ENCOUNTER — Ambulatory Visit: Payer: Medicare Other | Admitting: Gastroenterology

## 2019-07-01 ENCOUNTER — Other Ambulatory Visit: Payer: Self-pay | Admitting: Family Medicine

## 2019-07-01 DIAGNOSIS — K219 Gastro-esophageal reflux disease without esophagitis: Secondary | ICD-10-CM

## 2019-09-18 DIAGNOSIS — N3090 Cystitis, unspecified without hematuria: Secondary | ICD-10-CM | POA: Diagnosis not present

## 2019-10-12 DIAGNOSIS — N390 Urinary tract infection, site not specified: Secondary | ICD-10-CM | POA: Diagnosis not present

## 2019-10-12 DIAGNOSIS — N183 Chronic kidney disease, stage 3 unspecified: Secondary | ICD-10-CM | POA: Diagnosis not present

## 2019-10-12 DIAGNOSIS — E785 Hyperlipidemia, unspecified: Secondary | ICD-10-CM | POA: Diagnosis not present

## 2019-10-12 DIAGNOSIS — I10 Essential (primary) hypertension: Secondary | ICD-10-CM | POA: Diagnosis not present

## 2019-10-12 DIAGNOSIS — E1149 Type 2 diabetes mellitus with other diabetic neurological complication: Secondary | ICD-10-CM | POA: Diagnosis not present

## 2019-10-23 DIAGNOSIS — N3090 Cystitis, unspecified without hematuria: Secondary | ICD-10-CM | POA: Diagnosis not present

## 2019-10-26 DIAGNOSIS — M5136 Other intervertebral disc degeneration, lumbar region: Secondary | ICD-10-CM | POA: Diagnosis not present

## 2019-10-26 DIAGNOSIS — M48062 Spinal stenosis, lumbar region with neurogenic claudication: Secondary | ICD-10-CM | POA: Diagnosis not present

## 2019-10-26 DIAGNOSIS — M47816 Spondylosis without myelopathy or radiculopathy, lumbar region: Secondary | ICD-10-CM | POA: Diagnosis not present

## 2019-10-26 DIAGNOSIS — M545 Low back pain: Secondary | ICD-10-CM | POA: Diagnosis not present

## 2019-10-31 ENCOUNTER — Other Ambulatory Visit: Payer: Self-pay | Admitting: Family Medicine

## 2019-10-31 DIAGNOSIS — I10 Essential (primary) hypertension: Secondary | ICD-10-CM

## 2019-10-31 NOTE — Telephone Encounter (Signed)
LAST APPOINTMENT DATE: 01/17/2018 NEXT APPOINTMENT DATE:Visit date not found  Rx Zestoretic 20-25mg  LAST REFILL:03/24/2018  QTY:#90 2 Rf

## 2019-11-02 ENCOUNTER — Other Ambulatory Visit: Payer: Self-pay | Admitting: Rehabilitation

## 2019-11-02 DIAGNOSIS — M5136 Other intervertebral disc degeneration, lumbar region: Secondary | ICD-10-CM

## 2019-11-09 DIAGNOSIS — N3020 Other chronic cystitis without hematuria: Secondary | ICD-10-CM | POA: Diagnosis not present

## 2019-11-09 DIAGNOSIS — R310 Gross hematuria: Secondary | ICD-10-CM | POA: Diagnosis not present

## 2019-11-23 ENCOUNTER — Other Ambulatory Visit (INDEPENDENT_AMBULATORY_CARE_PROVIDER_SITE_OTHER): Payer: Medicare Other

## 2019-11-23 ENCOUNTER — Telehealth: Payer: Self-pay

## 2019-11-23 ENCOUNTER — Ambulatory Visit (INDEPENDENT_AMBULATORY_CARE_PROVIDER_SITE_OTHER): Payer: Medicare Other | Admitting: Gastroenterology

## 2019-11-23 ENCOUNTER — Encounter: Payer: Self-pay | Admitting: Gastroenterology

## 2019-11-23 VITALS — BP 128/50 | HR 74 | Temp 97.8°F | Ht 63.0 in | Wt 315.0 lb

## 2019-11-23 DIAGNOSIS — K582 Mixed irritable bowel syndrome: Secondary | ICD-10-CM | POA: Diagnosis not present

## 2019-11-23 DIAGNOSIS — D508 Other iron deficiency anemias: Secondary | ICD-10-CM

## 2019-11-23 LAB — CBC WITH DIFFERENTIAL/PLATELET
Basophils Absolute: 0 10*3/uL (ref 0.0–0.1)
Basophils Relative: 0.6 % (ref 0.0–3.0)
Eosinophils Absolute: 0.6 10*3/uL (ref 0.0–0.7)
Eosinophils Relative: 8.3 % — ABNORMAL HIGH (ref 0.0–5.0)
HCT: 32.8 % — ABNORMAL LOW (ref 36.0–46.0)
Hemoglobin: 9.9 g/dL — ABNORMAL LOW (ref 12.0–15.0)
Lymphocytes Relative: 19.7 % (ref 12.0–46.0)
Lymphs Abs: 1.5 10*3/uL (ref 0.7–4.0)
MCHC: 30.3 g/dL (ref 30.0–36.0)
MCV: 73.1 fl — ABNORMAL LOW (ref 78.0–100.0)
Monocytes Absolute: 0.4 10*3/uL (ref 0.1–1.0)
Monocytes Relative: 5.1 % (ref 3.0–12.0)
Neutro Abs: 5.1 10*3/uL (ref 1.4–7.7)
Neutrophils Relative %: 66.3 % (ref 43.0–77.0)
Platelets: 245 10*3/uL (ref 150.0–400.0)
RBC: 4.48 Mil/uL (ref 3.87–5.11)
RDW: 20.4 % — ABNORMAL HIGH (ref 11.5–15.5)
WBC: 7.7 10*3/uL (ref 4.0–10.5)

## 2019-11-23 LAB — FOLATE: Folate: 8.1 ng/mL (ref >5.9)

## 2019-11-23 LAB — IBC + FERRITIN
Ferritin: 4.7 ng/mL — ABNORMAL LOW (ref 10.0–291.0)
Iron: 19 ug/dL — ABNORMAL LOW (ref 42–145)
Saturation Ratios: 4.3 % — ABNORMAL LOW (ref 20.0–50.0)
Transferrin: 318 mg/dL (ref 212.0–360.0)

## 2019-11-23 LAB — VITAMIN B12: Vitamin B-12: 1470 pg/mL — ABNORMAL HIGH (ref 211–911)

## 2019-11-23 NOTE — Progress Notes (Signed)
[facility] Progress Note  Chief Complaint: Iron deficiency anemia  Subjective  History: From my December 2017 office note: "This is the initial office consult for a 54 year old woman referred by primary care for a constellation of GI symptoms. She reports years of intermittent crampy abdominal pain and bloating where she has right-sided abdominal distention that seems to get better after she takes laxatives. [place] between constipation and diarrhea and so she carries a diagnosis of IBS. She also feels all this seemed to start after a hysterectomy, and she is concerned that her bowels might have become "twisted" as a result of that. She has fibromyalgia and chronic headaches, has lately been off Percocet but is still on Fioricet for headaches. She has a chronic anxiety disorder as well. She is been taking BC powders because she has not been able to get into the pain clinic. She also says she was worked up at [facility] for possible bariatric surgery in 2013, but they were concerned "because there was some tumor on my bowels". She then said she was supposed to be referred to [facility], "but that fell through". Lastly, she complains for years that when she has a bowel movement she will have a pain in the back of the head travels all the way down to her lower spine and then she can feel her heart beating. There is intermittent nausea but no vomiting or weight loss."  On 12/03/2016, EGD normal.  Colonoscopy with excellent prep and a diminutive rectal adenomatous polyp.  Gina [name] was referred by her primary care provider (relatively newer to her since she moved to that county since I last saw her).  She has longstanding iron deficiency anemia and a repeat evaluation was requested.  She was on oral iron but I recommended she stop it a few years back because it was causing digestive upset.  She has never seen a hematologist nor had IV iron.  Halayna continues to have chronic constipation with  occasional diarrhea.  She is also bothered by chronic fatigue and back pain.  ROS: Cardiovascular:  no chest pain Respiratory: no dyspnea, but occasional wheezing Anxiety Allergy symptoms Lower back pain Remainder of systems negative except as above The patient's Past Medical, Family and Social History were reviewed and are on file in the EMR. Still smoking Objective:  Med list reviewed  Current Outpatient Medications:  .  ALPRAZolam (XANAX) 1 MG tablet, Take 1 mg by mouth 2 (two) times daily as needed for anxiety. , Disp: , Rfl:  .  atorvastatin (LIPITOR) 20 MG tablet, TAKE 1 TABLET (20 MG TOTAL) DAILY BY MOUTH., Disp: 90 tablet, Rfl: 2 .  cyclobenzaprine (FLEXERIL) 10 MG tablet, Take 10 mg by mouth 3 (three) times daily as needed for muscle spasms., Disp: , Rfl:  .  fluticasone (FLONASE) 50 MCG/ACT nasal spray, Place 1 spray into both nostrils daily., Disp: , Rfl:  .  Fluticasone-Salmeterol (WIXELA INHUB) 250-50 MCG/DOSE AEPB, Inhale 1 puff into the lungs 2 (two) times daily., Disp: 60 each, Rfl: 1 .  glucose blood (ONE TOUCH ULTRA TEST) test strip, USE TO TEST BLOOD SUGAR 3 TIMES DAILY, Disp: 100 each, Rfl: 3 .  lisinopril (ZESTRIL) 20 MG tablet, Take 20 mg by mouth daily., Disp: , Rfl:  .  metFORMIN (GLUCOPHAGE) 850 MG tablet, TAKE 1 TABLET (850 MG TOTAL) BY MOUTH 2 (TWO) TIMES DAILY WITH A MEAL., Disp: 180 tablet, Rfl: 2 .  NOVOLOG MIX 70/30 FLEXPEN (70-30) 100 UNIT/ML FlexPen, INJECT 12 UNITS  TWO TIMES DAILY, Disp: 15 mL, Rfl: 3 .  nystatin (NYSTATIN) powder, Apply topically 3 (three) times daily as needed., Disp: 60 g, Rfl: 6 .  omeprazole (PRILOSEC) 20 MG capsule, TAKE 1 CAPSULE (20 MG TOTAL) BY MOUTH DAILY BEFORE BREAKFAST., Disp: 90 capsule, Rfl: 2 .  pregabalin (LYRICA) 200 MG capsule, TAKE 1 CAPSULE BY MOUTH THREE TIMES A DAY, Disp: 90 capsule, Rfl: 2 .  promethazine (PHENERGAN) 25 MG tablet, Take by mouth as needed. , Disp: , Rfl:  .  VENTOLIN HFA 108 (90 Base) MCG/ACT  inhaler, INHALE 2 PUFFS EVERY 6 (SIX) HOURS AS NEEDED INTO THE LUNGS FOR WHEEZING OR SHORTNESS OF BREATH., Disp: 18 Inhaler, Rfl: 4   Vital signs in last 24 hrs: Vitals:   11/23/19 1049  BP: (!) 128/50  Pulse: 74  Temp: 97.8 F (36.6 C)  SpO2: 95%    Physical Exam  Morbidly obese  HEENT: sclera anicteric, oral mucosa moist without lesions  Neck: supple, no thyromegaly, JVD or lymphadenopathy  Cardiac: RRR without murmurs, S1S2 heard, no peripheral edema  Pulm: clear to auscultation bilaterally, normal RR and effort noted  Abdomen: soft, no tenderness, with active bowel sounds.  Not assess mass or hepatosplenomegaly due to abdominal girth and large pannus  Skin; warm and dry, no jaundice or rash  Labs:  Primary care labs from July 2020: LFTs normal.  Creatinine normal.  WBC 9.0, hemoglobin 10.2, hematocrit 31, MCV 76, RDW 16, platelet 279 Ferritin 6.0, iron 22, TIBC 348 (6% saturation)  10/12/2019 CMP normal, WBC 8.5, hemoglobin 9.9, hematocrit 30, MCV 72, RDW 15.6, platelet 256  Anemia going back a decade on Epic labs ___________________________________________ Radiologic studies:   ____________________________________________ Other:   _____________________________________________ Assessment & Plan  Assessment: Encounter Diagnoses  Name Primary?  . Other iron deficiency anemia Yes  . Irritable bowel syndrome with both constipation and diarrhea    Longstanding IBS symptoms.  Longstanding iron deficiency anemia, suspect iron malabsorption rather than chronic GI blood loss.  Status post hysterectomy.  She has GI intolerance of oral iron supplements.  Plan: CBC, iron studies, B12 and folate today. Referred to hematology for IV iron   30 minutes were spent on this encounter (including chart review, history/exam, counseling/coordination of care, and documentation)  [name]

## 2019-11-23 NOTE — Telephone Encounter (Signed)
Referral sent to Hematology/Oncology. Will await appt info

## 2019-11-23 NOTE — Patient Instructions (Signed)
If you are age 56 or older, your body mass index should be between 23-30. Your Body mass index is 55.8 kg/m. If this is out of the aforementioned range listed, please consider follow up with your Primary Care Provider.  If you are age 56 or younger, your body mass index should be between 19-25. Your Body mass index is 55.8 kg/m. If this is out of the aformentioned range listed, please consider follow up with your Primary Care Provider.   Your provider has requested that you go to the basement level for lab work before leaving today. Press "B" on the elevator. The lab is located at the first door on the left as you exit the elevator.  We have sent a referral to Hematology/Oncology with [facility].  They will reach out to you to schedule.   It was a pleasure to see you today!  Dr. Loletha Carrow

## 2019-11-24 ENCOUNTER — Telehealth: Payer: Self-pay | Admitting: Hematology and Oncology

## 2019-11-24 NOTE — Telephone Encounter (Signed)
Received a new hem referral from Dr. Loletha Carrow for IDA. Pt has been cld and scheduled to see Dr. Lorenso Courier on 5/10 at [facility]. Pt aware to arrive 15 minutes early.

## 2019-11-25 ENCOUNTER — Other Ambulatory Visit: Payer: Medicare Other

## 2019-11-27 NOTE — Telephone Encounter (Signed)
Appointment scheduled for 5-10 -2021

## 2019-12-15 ENCOUNTER — Other Ambulatory Visit: Payer: Self-pay

## 2019-12-15 ENCOUNTER — Ambulatory Visit
Admission: RE | Admit: 2019-12-15 | Discharge: 2019-12-15 | Disposition: A | Discharge status: Home / Self Care | Payer: Medicare Other | Source: Ambulatory Visit | Attending: Rehabilitation | Admitting: Rehabilitation

## 2019-12-15 ENCOUNTER — Telehealth: Payer: Self-pay | Admitting: Physician Assistant

## 2019-12-15 DIAGNOSIS — M48061 Spinal stenosis, lumbar region without neurogenic claudication: Secondary | ICD-10-CM | POA: Diagnosis not present

## 2019-12-15 DIAGNOSIS — M5136 Other intervertebral disc degeneration, lumbar region: Secondary | ICD-10-CM

## 2019-12-15 NOTE — Telephone Encounter (Signed)
Pt's new hem appt has been moved for her to see Cassie on 5/10 at 930am instead of Dr. Lorenso Courier. She will have labs at 9am. I cld and lft the appt change on her voicemail.

## 2019-12-17 NOTE — Progress Notes (Signed)
[place] [phone]   [phone]  CONSULT NOTE  REFERRING PHYSICIAN: Dr. Loletha Carrow  REASON FOR CONSULTATION:  Iron Deficiency Anemia  HPI Gina [name] is a 54 y.o. female with a past medical history significant for IBS, fibromyalgia, anxiety, recurrent urinary tract infections with hematuria, nephrolithiasis, intertrigo,  hypertension, community-acquired pneumonia, asthma, GERD, diabetes, acute encephalopathy, peripheral neuropathy, bacteremia, and chronic back pain disorder is referred to the clinic for evaluation of iron deficiency anemia. She has had anemia for approximately 20-30 years per patient report.  Per chart review, it does appear that the patient has been anemic with a hemoglobin ~9 for at least 8 years.   The patient was seen by her gastroenterologist for the chief complaint of iron deficiency anemia on 11/23/2019.  She was initially referred to the clinic for a constellation of GI symptoms including intermittent crampy abdominal pain, bloating, right-sided abdominal pain and distention, constipation/diarrhea. Her las colonoscopy was on On 12/03/2016, EGD normal.  Colonoscopy with a diminutive rectal adenomatous polyp. She had iron studies performed on 11/23/2019 which demonstrated persistent microcytic anemia with hemoglobin 9.9 and MCV of 73.1.  Her RDW was elevated at 20.4.  Her iron studies demonstrate a low iron at 19 a saturation ratio low at 4.3% and low ferritin at 4.7%.  Vitamin B12 was actually elevated and her folate was within normal limits.  The patient also has a longstanding history of iron deficiency anemia.  She carries a diagnosis of IBS.  The patient states that she tried taking prescription as well as over-the-counter iron supplements which flared of her IBS and she is therefore intolerant of iron supplements. She is never required a blood transfusion or an iron infusion.  The patient denies any abnormal bleeding or bruising  including epistaxis, gingival bleeding, hemoptysis, hematemesis, melena, vaginal bleeding, or hematochezia.  The patient does note that she frequently has hematuria which is noted on her urinalysis performed for her recurrent UTIs and frequent episodes of nephrolithiasis.  The patient is followed by urology for this concern.  Patient had a hysterectomy in around 2004 and denies any vaginal bleeding.  The patient denies frequent ibuprofen use and states that she has ibuprofen approximately once a month; however, the patient does use BC powder daily.  The patient was advised in the past to discontinue using BC powder.  The patient denies any history of bariatric surgery.  The patient denies any certain dietary habits such as being a vegan or vegetarian.  She eats red meat.  Today, the patient notes that she feels fatigued and notes generalized weakness.  Occasionally feels lightheaded as well and has decreased exercise tolerance.  The patient notes shortness of breath but she is not sure if this is attributed to her asthma.   The patient's family history significant for mother who had " female cancer" and liver cirrhosis.  The patient's father had hypertension, coronary artery disease, diabetes, and kidney stones.  The patient denies any other family history of malignancy or anemia.  The patient is married and unemployed.  The patient has 2 biological children and a stepdaughter and an adopted son.  The patient denies any history of drug abuse.  She drinks approximately 1-2 alcoholic beverages per month on social occasions.  She smokes approximately 1 pack of cigarettes per day and has done so for 20 years.   HPI  Past Medical History:  Diagnosis Date  . Anemia   . Anxiety   . Arthritis   .  Asthma   . Back pain   . Chronic pain   . Diabetes mellitus    type 2  . Family history of adverse reaction to anesthesia    youngest daughter hard to wake up both daughters post op n/v  . Fibromyalgia   .  GERD (gastroesophageal reflux disease)   . Headache    migraines  . History of kidney stones    2 big stones in left kidney now  . History of recurrent UTIs    last uti 3 weeks ago  . Hyperlipidemia   . Hypertension   . IBS (irritable bowel syndrome)   . Obesities, morbid ([place])   . Pneumonia 06/2016  . PONV (postoperative nausea and vomiting) 2004   came back after hystectomy bowels locked up on life support for 1 month   . Sinusitis   . 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 Umbilical hernia   . Wound discharge    open wound right leg going to wound center changes dressing qday with santyl cream quarter size wound    Past Surgical History:  Procedure Laterality Date  . ABDOMINAL HYSTERECTOMY     1 ovary left  . CARPAL TUNNEL RELEASE Right   . CESAREAN SECTION     x 2  . CHOLECYSTECTOMY    . COLONOSCOPY WITH PROPOFOL N/A 12/03/2016   Procedure: COLONOSCOPY WITH PROPOFOL;  Surgeon: Doran Stabler, MD;  Location: [facility];  Service: Gastroenterology;  Laterality: N/A;  . CYSTECTOMY     left foot  . ESOPHAGOGASTRODUODENOSCOPY (EGD) WITH PROPOFOL N/A 12/03/2016   Procedure: ESOPHAGOGASTRODUODENOSCOPY (EGD) WITH PROPOFOL;  Surgeon: Doran Stabler, MD;  Location: [facility];  Service: Gastroenterology;  Laterality: N/A;  . LITHOTRIPSY    . MULTIPLE TOOTH EXTRACTIONS      Family History  Problem Relation Age of Onset  . Cancer Mother   . Cirrhosis Mother   . Heart failure Father   . Diabetes Father   . Hypertension Father   . Colon cancer Neg Hx     Social History Social History   Tobacco Use  . Smoking status: Current Every Day Smoker    Packs/day: 0.50    Years: 33.00    Pack years: 16.50    Types: Cigarettes  . Smokeless tobacco: Never Used  Substance Use Topics  . Alcohol use: No  . Drug use: No    Allergies  Allergen Reactions  . Darvocet [Propoxyphene N-Acetaminophen] Nausea And Vomiting  . Morphine And Related Itching    . Penicillins Hives and Swelling    Tolerates Rocephin  Swelling all over body  Has patient had a PCN reaction causing immediate rash, facial/tongue/throat swelling, SOB or lightheadedness with hypotension: Yes Has patient had a PCN reaction causing severe rash involving mucus membranes or skin necrosis: Yes Has patient had a PCN reaction that required hospitalization Unknown Has patient had a PCN reaction occurring within the last 10 years: No  If all of the above answers are "NO", then may proceed with Cephalosporin use.   . Tape Hives    Adhesive tape    Current Outpatient Medications  Medication Sig Dispense Refill  . ALPRAZolam (XANAX) 1 MG tablet Take 1 mg by mouth 2 (two) times daily as needed for anxiety.     Marland Kitchen atorvastatin (LIPITOR) 20 MG tablet TAKE 1 TABLET (20 MG TOTAL) DAILY BY MOUTH. 90 tablet 2  . cyclobenzaprine (FLEXERIL) 10  MG tablet Take 10 mg by mouth 3 (three) times daily as needed for muscle spasms.    . fluticasone (FLONASE) 50 MCG/ACT nasal spray Place 1 spray into both nostrils daily.    . Fluticasone-Salmeterol (WIXELA INHUB) 250-50 MCG/DOSE AEPB Inhale 1 puff into the lungs 2 (two) times daily. 60 each 1  . glucose blood (ONE TOUCH ULTRA TEST) test strip USE TO TEST BLOOD SUGAR 3 TIMES DAILY 100 each 3  . lisinopril (ZESTRIL) 10 MG tablet Take 10 mg by mouth daily.    . metFORMIN (GLUCOPHAGE) 850 MG tablet TAKE 1 TABLET (850 MG TOTAL) BY MOUTH 2 (TWO) TIMES DAILY WITH A MEAL. 180 tablet 2  . NOVOLOG MIX 70/30 FLEXPEN (70-30) 100 UNIT/ML FlexPen INJECT 12 UNITS TWO TIMES DAILY 15 mL 3  . nystatin (NYSTATIN) powder Apply topically 3 (three) times daily as needed. 60 g 6  . omeprazole (PRILOSEC) 20 MG capsule TAKE 1 CAPSULE (20 MG TOTAL) BY MOUTH DAILY BEFORE BREAKFAST. 90 capsule 2  . pregabalin (LYRICA) 200 MG capsule TAKE 1 CAPSULE BY MOUTH THREE TIMES A DAY 90 capsule 2  . promethazine (PHENERGAN) 25 MG tablet Take by mouth as needed.     . VENTOLIN HFA 108  (90 Base) MCG/ACT inhaler INHALE 2 PUFFS EVERY 6 (SIX) HOURS AS NEEDED INTO THE LUNGS FOR WHEEZING OR SHORTNESS OF BREATH. 18 Inhaler 4  . HYDROcodone-acetaminophen (NORCO) 7.5-325 MG tablet Take 1 tablet by mouth every 6 (six) hours as needed. Used for Kidney stones and recurrent UTI's     No current facility-administered medications for this visit.    Review of Systems  REVIEW OF SYSTEMS:   Constitutional: Positive for fatigue, decreased exercise tolerance, generalized weakness.  Negative for appetite change, chills, fever and unexpected weight change.  HENT: Negative for mouth sores, nosebleeds, sore throat and trouble swallowing.   Eyes: Negative for eye problems and icterus.  Respiratory: Positive for shortness of breath with exertion. negative for cough, hemoptysis, and wheezing.   Cardiovascular: Negative for chest pain and leg swelling.  Gastrointestinal: Positive for occasional diarrhea and constipation.  Negative for abdominal pain, nausea and vomiting.  Genitourinary: Positive for history hematuria. negative for bladder incontinence, difficulty urinating, dysuria, and  frequency. Musculoskeletal: Positive for chronic back pain. negative for gait problem, neck pain and neck stiffness.  Skin: Negative for itching and rash.  Neurological: Negative for dizziness, extremity weakness, gait problem, headaches, light-headedness and seizures.  Hematological: Negative for adenopathy. Does not bruise/bleed easily.  Psychiatric/Behavioral: Negative for confusion, depression and sleep disturbance. The patient is not nervous/anxious.     PHYSICAL EXAMINATION:  Blood pressure (!) 125/51, pulse 83, temperature 98.3 F (36.8 C), temperature source Temporal, resp. rate 17, height 5\' 3"  (1.6 m), weight (!) 322 lb 3.2 oz (146.1 kg), SpO2 93 %.  ECOG PERFORMANCE STATUS: 1  Physical Exam  Constitutional: Oriented to person, place, and time and well-developed, well-nourished, and in no distress.     HENT:  Head: Normocephalic and atraumatic.  Mouth/Throat: Oropharynx is clear and moist. No oropharyngeal exudate.  Eyes: Conjunctivae are normal. Right eye exhibits no discharge. Left eye exhibits no discharge. No scleral icterus.  Neck: Normal range of motion. Neck supple.  Cardiovascular: Normal rate, regular rhythm, normal heart sounds and intact distal pulses.   Pulmonary/Chest: Effort normal and breath sounds normal. No respiratory distress. No wheezes. No rales.  Abdominal: Soft.  Diffuse tenderness to palpation across entire abdomen secondary to fibromyalgia.  Bowel sounds are normal. Exhibits  no distension and no mass.  Musculoskeletal: Normal range of motion. Exhibits no edema.  Lymphadenopathy:    No cervical adenopathy.  Neurological: Alert and oriented to person, place, and time. Exhibits normal muscle tone. Gait normal. Coordination normal.  Skin: Skin is warm and dry. No rash noted. Not diaphoretic. No erythema. No pallor.  Psychiatric: Mood, memory and judgment normal.  Vitals reviewed.  LABORATORY DATA: Lab Results  Component Value Date   WBC 9.0 12/18/2019   HGB 9.2 (L) 12/18/2019   HCT 32.6 (L) 12/18/2019   MCV 77.1 (L) 12/18/2019   PLT 244 12/18/2019      Chemistry      Component Value Date/Time   NA 140 01/17/2018 1242   NA 141 04/23/2014 1104   K 4.8 01/17/2018 1242   K 4.3 04/23/2014 1104   CL 100 01/17/2018 1242   CO2 32 01/17/2018 1242   CO2 26 04/23/2014 1104   BUN 12 01/17/2018 1242   BUN 15.8 04/23/2014 1104   CREATININE 0.86 01/17/2018 1242   CREATININE 1.0 04/23/2014 1104      Component Value Date/Time   CALCIUM 10.0 01/17/2018 1242   CALCIUM 9.7 04/23/2014 1104   ALKPHOS 74 02/12/2017 0333   ALKPHOS 89 04/23/2014 1104   AST 31 02/12/2017 0333   AST 20 04/23/2014 1104   ALT 30 02/12/2017 0333   ALT 21 04/23/2014 1104   BILITOT 0.4 02/12/2017 0333   BILITOT 0.29 04/23/2014 1104       RADIOGRAPHIC STUDIES: MR LUMBAR SPINE WO  CONTRAST  Result Date: 12/15/2019 CLINICAL DATA:  Lumbar disc degeneration. Low back pain with numbness and weakness in both legs. EXAM: MRI LUMBAR SPINE WITHOUT CONTRAST TECHNIQUE: Multiplanar, multisequence MR imaging of the lumbar spine was performed. No intravenous contrast was administered. COMPARISON:  Lumbar radiographs 12/19/2010. Outside lumbar MRI 06/20/2015 FINDINGS: Suboptimal image quality due to large patient size. Segmentation: Normal Alignment:  Mild retrolisthesis L4-5.  Remaining alignment normal. Vertebrae:  Negative for fracture or mass. Conus medullaris and cauda equina: Conus extends to the L1-2 level. Conus and cauda equina appear normal. Paraspinal and other soft tissues: Negative for paraspinous mass or adenopathy. Disc levels: L1-2: Negative L2-3: Mild facet degeneration.  Negative for stenosis L3-4: Mild disc and mild facet degeneration. Negative for disc protrusion or stenosis. L4-5: Moderate disc degeneration with Schmorl's nodes on the left. Disc bulging and endplate spurring. Mild facet degeneration. Mild spinal stenosis and mild to moderate subarticular stenosis bilaterally right greater than left L5-S1: Negative IMPRESSION: Multilevel degenerative changes in the lumbar spine as above. This is most prominent L4-5 where there is mild spinal stenosis and mild to moderate subarticular stenosis bilaterally right greater than left. Electronically Signed   By: Franchot Gallo M.D.   On: 12/15/2019 10:02    ASSESSMENT: This is a very pleasant 54 year old female referred to the clinic for evaluation of iron deficiency anemia  PLAN: The patient was seen with Dr. Julien Nordmann today.  The patient had a repeat CBC today which demonstrated persistent microcytic anemia with a hemoglobin of 9.2.  Her platelets and WBCs were within normal limits.  The patient had an SPEP performed as well which is still pending at this time.   I will arrange for the patient to receive weekly IV iron with Venofer  x4 due to her deficiency and intolerance to oral iron supplements secondary to her IBS. I will arrange for this to be started this week or next week.   I will also arrange  for the patient to have FOBT testing to ensure that the patient does not have any GI blood loss. I will also arrange for the patient to have heavy metal lab testing performed.   We will see the patient back for follow-up visit in 8 weeks for evaluation and repeat CBC, iron studies, and ferritin.  The patient was advised to refrain from using [facility] powder as well as ibuprofen due to gastric upset.   The patient voices understanding of current disease status and treatment options and is in agreement with the current care plan.  All questions were answered. The patient knows to call the clinic with any problems, questions or concerns. We can certainly see the patient much sooner if necessary.  Thank you so much for allowing me to participate in the care of Gina [name]. I will continue to follow up the patient with you and assist in her care.  The total time spent in the appointment was 60 minutes.  Disclaimer: This note was dictated with voice recognition software. Similar sounding words can inadvertently be transcribed and may not be corrected upon review.   [place] Dec 18, 2019, 10:46 AM  ADDENDUM: Hematology/Oncology Attending: I had a face-to-face encounter with the patient today.  I recommended her care plan.  This is a very pleasant 54 years old white female with persistent iron deficiency anemia for many years.  The patient has upper endoscopy and colonoscopy few years ago that were unremarkable.  She cannot tolerate oral iron tablets because of IBS.  The patient was referred to [place] today for evaluation and recommendation regarding treatment of her condition. Her recent iron study showed severe iron deficiency with low ferritin and serum iron. I recommended for the patient to receive iron infusion  with Venofer for the next 4 weeks. We will also check her stool for Hemoccult.  Will check SPEP with immunofixation as well as heavy metal level. The patient was advised to refrain from continuous use of the [facility] powder and ibuprofen because of the gastritis. She will come back for follow-up visit in 2 months for evaluation and repeat CBC, iron study and ferritin. She was advised to call immediately if she has any other concerning symptoms in the interval.  Disclaimer: This note was dictated with voice recognition software. Similar sounding words can inadvertently be transcribed and may be missed upon review. Eilleen Kempf, MD 12/18/19

## 2019-12-18 ENCOUNTER — Inpatient Hospital Stay: Payer: Medicare Other | Admitting: Hematology and Oncology

## 2019-12-18 ENCOUNTER — Inpatient Hospital Stay: Payer: Medicare Other | Attending: Physician Assistant | Admitting: Physician Assistant

## 2019-12-18 ENCOUNTER — Inpatient Hospital Stay: Payer: Medicare Other

## 2019-12-18 ENCOUNTER — Other Ambulatory Visit: Payer: Self-pay

## 2019-12-18 ENCOUNTER — Other Ambulatory Visit: Payer: Self-pay | Admitting: Physician Assistant

## 2019-12-18 ENCOUNTER — Encounter: Payer: Self-pay | Admitting: Physician Assistant

## 2019-12-18 VITALS — BP 125/51 | HR 83 | Temp 98.3°F | Resp 17 | Ht 63.0 in | Wt 322.2 lb

## 2019-12-18 DIAGNOSIS — D509 Iron deficiency anemia, unspecified: Secondary | ICD-10-CM

## 2019-12-18 DIAGNOSIS — Z90710 Acquired absence of both cervix and uterus: Secondary | ICD-10-CM | POA: Diagnosis not present

## 2019-12-18 DIAGNOSIS — Z809 Family history of malignant neoplasm, unspecified: Secondary | ICD-10-CM

## 2019-12-18 DIAGNOSIS — F17210 Nicotine dependence, cigarettes, uncomplicated: Secondary | ICD-10-CM | POA: Diagnosis not present

## 2019-12-18 DIAGNOSIS — K589 Irritable bowel syndrome without diarrhea: Secondary | ICD-10-CM | POA: Diagnosis not present

## 2019-12-18 DIAGNOSIS — Z90721 Acquired absence of ovaries, unilateral: Secondary | ICD-10-CM

## 2019-12-18 DIAGNOSIS — D649 Anemia, unspecified: Secondary | ICD-10-CM | POA: Insufficient documentation

## 2019-12-18 LAB — CBC WITH DIFFERENTIAL (CANCER CENTER ONLY)
Abs Immature Granulocytes: 0.03 10*3/uL (ref 0.00–0.07)
Basophils Absolute: 0.1 10*3/uL (ref 0.0–0.1)
Basophils Relative: 1 %
Eosinophils Absolute: 1.4 10*3/uL — ABNORMAL HIGH (ref 0.0–0.5)
Eosinophils Relative: 16 %
HCT: 32.6 % — ABNORMAL LOW (ref 36.0–46.0)
Hemoglobin: 9.2 g/dL — ABNORMAL LOW (ref 12.0–15.0)
Immature Granulocytes: 0 %
Lymphocytes Relative: 18 %
Lymphs Abs: 1.6 10*3/uL (ref 0.7–4.0)
MCH: 21.7 pg — ABNORMAL LOW (ref 26.0–34.0)
MCHC: 28.2 g/dL — ABNORMAL LOW (ref 30.0–36.0)
MCV: 77.1 fL — ABNORMAL LOW (ref 80.0–100.0)
Monocytes Absolute: 0.4 10*3/uL (ref 0.1–1.0)
Monocytes Relative: 5 %
Neutro Abs: 5.5 10*3/uL (ref 1.7–7.7)
Neutrophils Relative %: 60 %
Platelet Count: 244 10*3/uL (ref 150–400)
RBC: 4.23 MIL/uL (ref 3.87–5.11)
RDW: 20.2 % — ABNORMAL HIGH (ref 11.5–15.5)
WBC Count: 9 10*3/uL (ref 4.0–10.5)
nRBC: 0 % (ref 0.0–0.2)

## 2019-12-19 LAB — PROTEIN ELECTROPHORESIS, SERUM, WITH REFLEX
A/G Ratio: 1.1 (ref 0.7–1.7)
Albumin ELP: 3.2 g/dL (ref 2.9–4.4)
Alpha-1-Globulin: 0.2 g/dL (ref 0.0–0.4)
Alpha-2-Globulin: 0.9 g/dL (ref 0.4–1.0)
Beta Globulin: 1.1 g/dL (ref 0.7–1.3)
Gamma Globulin: 0.6 g/dL (ref 0.4–1.8)
Globulin, Total: 2.8 g/dL (ref 2.2–3.9)
Total Protein ELP: 6 g/dL (ref 6.0–8.5)

## 2019-12-20 ENCOUNTER — Telehealth: Payer: Self-pay | Admitting: *Deleted

## 2019-12-20 NOTE — Telephone Encounter (Signed)
Notified of message below. Verbalized understanding 

## 2019-12-20 NOTE — Telephone Encounter (Signed)
-----   Message from [employer], PA-C sent at 12/20/2019 10:33 AM EDT ----- Regarding: FW: Can you call this patient and let her know her other tests were normal (besides the known iron deficiency), she can pick up a copy when she returns for blood or it will be on her my chart. Just encourage her to bring her stool cards back in when she's comes in for the iron. ----- Message ----- From: Interface, Lab In [place] Sent: 12/18/2019   9:19 AM EDT To: Tobe Sos Heilingoetter, PA-C

## 2019-12-22 ENCOUNTER — Ambulatory Visit: Payer: Medicare Other

## 2019-12-22 ENCOUNTER — Inpatient Hospital Stay: Payer: Medicare Other

## 2019-12-28 DIAGNOSIS — M47816 Spondylosis without myelopathy or radiculopathy, lumbar region: Secondary | ICD-10-CM | POA: Diagnosis not present

## 2019-12-28 DIAGNOSIS — M5136 Other intervertebral disc degeneration, lumbar region: Secondary | ICD-10-CM | POA: Diagnosis not present

## 2019-12-28 DIAGNOSIS — M48062 Spinal stenosis, lumbar region with neurogenic claudication: Secondary | ICD-10-CM | POA: Diagnosis not present

## 2019-12-29 ENCOUNTER — Other Ambulatory Visit: Payer: Self-pay

## 2019-12-29 ENCOUNTER — Inpatient Hospital Stay: Payer: Medicare Other

## 2019-12-29 VITALS — BP 119/51 | HR 68 | Temp 98.4°F | Resp 18

## 2019-12-29 DIAGNOSIS — D509 Iron deficiency anemia, unspecified: Secondary | ICD-10-CM

## 2019-12-29 DIAGNOSIS — K589 Irritable bowel syndrome without diarrhea: Secondary | ICD-10-CM | POA: Diagnosis not present

## 2019-12-29 LAB — OCCULT BLOOD X 1 CARD TO LAB, STOOL
Fecal Occult Bld: NEGATIVE
Fecal Occult Bld: NEGATIVE
Fecal Occult Bld: NEGATIVE

## 2019-12-29 MED ORDER — DIPHENHYDRAMINE HCL 25 MG PO CAPS
ORAL_CAPSULE | ORAL | Status: AC
  Filled 2019-12-29: qty 2

## 2019-12-29 MED ORDER — DIPHENHYDRAMINE HCL 25 MG PO CAPS
50.0000 mg | ORAL_CAPSULE | Freq: Once | ORAL | Status: AC
  Administered 2019-12-29: 50 mg via ORAL

## 2019-12-29 MED ORDER — SODIUM CHLORIDE 0.9 % IV SOLN
200.0000 mg | Freq: Once | INTRAVENOUS | Status: AC
  Administered 2019-12-29: 200 mg via INTRAVENOUS
  Filled 2019-12-29: qty 200

## 2019-12-29 MED ORDER — ACETAMINOPHEN 325 MG PO TABS
ORAL_TABLET | ORAL | Status: AC
  Filled 2019-12-29: qty 2

## 2019-12-29 MED ORDER — SODIUM CHLORIDE 0.9 % IV SOLN
Freq: Once | INTRAVENOUS | Status: AC
  Filled 2019-12-29: qty 250

## 2019-12-29 MED ORDER — ACETAMINOPHEN 325 MG PO TABS
650.0000 mg | ORAL_TABLET | Freq: Once | ORAL | Status: AC
  Administered 2019-12-29: 650 mg via ORAL

## 2019-12-29 NOTE — Patient Instructions (Signed)
Iron Sucrose injection What is this medicine? IRON SUCROSE (AHY ern SOO krohs) is an iron complex. Iron is used to make healthy red blood cells, which carry oxygen and nutrients throughout the body. This medicine is used to treat iron deficiency anemia in people with chronic kidney disease. This medicine may be used for other purposes; ask your health care provider or pharmacist if you have questions. COMMON BRAND NAME(S): Venofer What should I tell my health care provider before I take this medicine? They need to know if you have any of these conditions:  anemia not caused by low iron levels  heart disease  high levels of iron in the blood  kidney disease  liver disease  an unusual or allergic reaction to iron, other medicines, foods, dyes, or preservatives  pregnant or trying to get pregnant  breast-feeding How should I use this medicine? This medicine is for infusion into a vein. It is given by a health care professional in a hospital or clinic setting. Talk to your pediatrician regarding the use of this medicine in children. While this drug may be prescribed for children as young as 2 years for selected conditions, precautions do apply. Overdosage: If you think you have taken too much of this medicine contact a poison control center or emergency room at once. NOTE: This medicine is only for you. Do not share this medicine with others. What if I miss a dose? It is important not to miss your dose. Call your doctor or health care professional if you are unable to keep an appointment. What may interact with this medicine? Do not take this medicine with any of the following medications:  deferoxamine  dimercaprol  other iron products This medicine may also interact with the following medications:  chloramphenicol  deferasirox This list may not describe all possible interactions. Give your health care provider a list of all the medicines, herbs, non-prescription drugs, or  dietary supplements you use. Also tell them if you smoke, drink alcohol, or use illegal drugs. Some items may interact with your medicine. What should I watch for while using this medicine? Visit your doctor or healthcare professional regularly. Tell your doctor or healthcare professional if your symptoms do not start to get better or if they get worse. You may need blood work done while you are taking this medicine. You may need to follow a special diet. Talk to your doctor. Foods that contain iron include: whole grains/cereals, dried fruits, beans, or peas, leafy green vegetables, and organ meats (liver, kidney). What side effects may I notice from receiving this medicine? Side effects that you should report to your doctor or health care professional as soon as possible:  allergic reactions like skin rash, itching or hives, swelling of the face, lips, or tongue  breathing problems  changes in blood pressure  cough  fast, irregular heartbeat  feeling faint or lightheaded, falls  fever or chills  flushing, sweating, or hot feelings  joint or muscle aches/pains  seizures  swelling of the ankles or feet  unusually weak or tired Side effects that usually do not require medical attention (report to your doctor or health care professional if they continue or are bothersome):  diarrhea  feeling achy  headache  irritation at site where injected  nausea, vomiting  stomach upset  tiredness This list may not describe all possible side effects. Call your doctor for medical advice about side effects. You may report side effects to FDA at 1-800-FDA-1088. Where should I keep   my medicine? This drug is given in a hospital or clinic and will not be stored at home. NOTE: This sheet is a summary. It may not cover all possible information. If you have questions about this medicine, talk to your doctor, pharmacist, or health care provider.  2020 Elsevier/Gold Standard (2011-05-07  17:14:35) Coronavirus (COVID-19) Are you at risk?  Are you at risk for the Coronavirus (COVID-19)?  To be considered HIGH RISK for Coronavirus (COVID-19), you have to meet the following criteria:  . Traveled to [place], [place], [place], [place] or [place]; or in the [place] to [place], [place], [place], or [place]; and have fever, cough, and shortness of breath within the last 2 weeks of travel OR . Been in close contact with a person diagnosed with COVID-19 within the last 2 weeks and have fever, cough, and shortness of breath . IF YOU DO NOT MEET THESE CRITERIA, YOU ARE CONSIDERED LOW RISK FOR COVID-19.  What to do if you are HIGH RISK for COVID-19?  . If you are having a medical emergency, call 911. . Seek medical care right away. Before you go to a doctor's office, urgent care or emergency department, call ahead and tell them about your recent travel, contact with someone diagnosed with COVID-19, and your symptoms. You should receive instructions from your physician's office regarding next steps of care.  . When you arrive at healthcare provider, tell the healthcare staff immediately you have returned from visiting [place], [place], [place], [place] or [place]; or traveled in the [place] to [place], [place], [place], or [place]; in the last two weeks or you have been in close contact with a person diagnosed with COVID-19 in the last 2 weeks.   . Tell the health care staff about your symptoms: fever, cough and shortness of breath. . After you have been seen by a medical provider, you will be either: o Tested for (COVID-19) and discharged home on quarantine except to seek medical care if symptoms worsen, and asked to  - Stay home and avoid contact with others until you get your results (4-5 days)  - Avoid travel on public transportation if possible (such as bus, train, or airplane) or o Sent to the Emergency Department by EMS for evaluation, COVID-19 testing, and  possible admission depending on your condition and test results.  What to do if you are LOW RISK for COVID-19?  Reduce your risk of any infection by using the same precautions used for avoiding the common cold or flu:  . Wash your hands often with soap and warm water for at least 20 seconds.  If soap and water are not readily available, use an alcohol-based hand sanitizer with at least 60% alcohol.  . If coughing or sneezing, cover your mouth and nose by coughing or sneezing into the elbow areas of your shirt or coat, into a tissue or into your sleeve (not your hands). . Avoid shaking hands with others and consider head nods or verbal greetings only. . Avoid touching your eyes, nose, or mouth with unwashed hands.  . Avoid close contact with people who are sick. . Avoid places or events with large numbers of people in one location, like concerts or sporting events. . Carefully consider travel plans you have or are making. . If you are planning any travel outside or inside the [place], visit the CDC's Travelers' Health webpage for the latest health notices. . If you have some symptoms but not all   symptoms, continue to monitor at home and seek medical attention if your symptoms worsen. . If you are having a medical emergency, call 911.   ADDITIONAL HEALTHCARE OPTIONS FOR PATIENTS  [facility] / e-Visit: [email or web address]         [facility]: [phone]  [facility]: [phone]                   [facility]: [phone]  

## 2020-01-01 LAB — HEAVY METALS, BLOOD
Arsenic: 6 ug/L (ref 2–23)
Lead: <1 ug/dL (ref 0–4)
Mercury: <1 ug/L (ref 0.0–14.9)

## 2020-01-05 ENCOUNTER — Other Ambulatory Visit: Payer: Self-pay

## 2020-01-05 ENCOUNTER — Inpatient Hospital Stay: Payer: Medicare Other

## 2020-01-05 VITALS — BP 142/74 | HR 66 | Temp 98.1°F | Resp 18 | Wt 280.5 lb

## 2020-01-05 DIAGNOSIS — K589 Irritable bowel syndrome without diarrhea: Secondary | ICD-10-CM | POA: Diagnosis not present

## 2020-01-05 DIAGNOSIS — D509 Iron deficiency anemia, unspecified: Secondary | ICD-10-CM | POA: Diagnosis not present

## 2020-01-05 MED ORDER — ACETAMINOPHEN 325 MG PO TABS
650.0000 mg | ORAL_TABLET | Freq: Once | ORAL | Status: AC
  Administered 2020-01-05: 650 mg via ORAL

## 2020-01-05 MED ORDER — DIPHENHYDRAMINE HCL 25 MG PO CAPS
ORAL_CAPSULE | ORAL | Status: AC
  Filled 2020-01-05: qty 2

## 2020-01-05 MED ORDER — SODIUM CHLORIDE 0.9 % IV SOLN
Freq: Once | INTRAVENOUS | Status: AC
  Filled 2020-01-05: qty 250

## 2020-01-05 MED ORDER — DIPHENHYDRAMINE HCL 25 MG PO CAPS
50.0000 mg | ORAL_CAPSULE | Freq: Once | ORAL | Status: AC
  Administered 2020-01-05: 50 mg via ORAL

## 2020-01-05 MED ORDER — SODIUM CHLORIDE 0.9 % IV SOLN
200.0000 mg | Freq: Once | INTRAVENOUS | Status: AC
  Administered 2020-01-05: 200 mg via INTRAVENOUS
  Filled 2020-01-05: qty 200

## 2020-01-05 MED ORDER — ACETAMINOPHEN 325 MG PO TABS
ORAL_TABLET | ORAL | Status: AC
  Filled 2020-01-05: qty 2

## 2020-01-05 NOTE — Patient Instructions (Signed)
Iron Sucrose injection What is this medicine? IRON SUCROSE (AHY ern SOO krohs) is an iron complex. Iron is used to make healthy red blood cells, which carry oxygen and nutrients throughout the body. This medicine is used to treat iron deficiency anemia in people with chronic kidney disease. This medicine may be used for other purposes; ask your health care provider or pharmacist if you have questions. COMMON BRAND NAME(S): Venofer What should I tell my health care provider before I take this medicine? They need to know if you have any of these conditions:  anemia not caused by low iron levels  heart disease  high levels of iron in the blood  kidney disease  liver disease  an unusual or allergic reaction to iron, other medicines, foods, dyes, or preservatives  pregnant or trying to get pregnant  breast-feeding How should I use this medicine? This medicine is for infusion into a vein. It is given by a health care professional in a hospital or clinic setting. Talk to your pediatrician regarding the use of this medicine in children. While this drug may be prescribed for children as young as 2 years for selected conditions, precautions do apply. Overdosage: If you think you have taken too much of this medicine contact a poison control center or emergency room at once. NOTE: This medicine is only for you. Do not share this medicine with others. What if I miss a dose? It is important not to miss your dose. Call your doctor or health care professional if you are unable to keep an appointment. What may interact with this medicine? Do not take this medicine with any of the following medications:  deferoxamine  dimercaprol  other iron products This medicine may also interact with the following medications:  chloramphenicol  deferasirox This list may not describe all possible interactions. Give your health care provider a list of all the medicines, herbs, non-prescription drugs, or  dietary supplements you use. Also tell them if you smoke, drink alcohol, or use illegal drugs. Some items may interact with your medicine. What should I watch for while using this medicine? Visit your doctor or healthcare professional regularly. Tell your doctor or healthcare professional if your symptoms do not start to get better or if they get worse. You may need blood work done while you are taking this medicine. You may need to follow a special diet. Talk to your doctor. Foods that contain iron include: whole grains/cereals, dried fruits, beans, or peas, leafy green vegetables, and organ meats (liver, kidney). What side effects may I notice from receiving this medicine? Side effects that you should report to your doctor or health care professional as soon as possible:  allergic reactions like skin rash, itching or hives, swelling of the face, lips, or tongue  breathing problems  changes in blood pressure  cough  fast, irregular heartbeat  feeling faint or lightheaded, falls  fever or chills  flushing, sweating, or hot feelings  joint or muscle aches/pains  seizures  swelling of the ankles or feet  unusually weak or tired Side effects that usually do not require medical attention (report to your doctor or health care professional if they continue or are bothersome):  diarrhea  feeling achy  headache  irritation at site where injected  nausea, vomiting  stomach upset  tiredness This list may not describe all possible side effects. Call your doctor for medical advice about side effects. You may report side effects to FDA at 1-800-FDA-1088. Where should I keep   my medicine? This drug is given in a hospital or clinic and will not be stored at home. NOTE: This sheet is a summary. It may not cover all possible information. If you have questions about this medicine, talk to your doctor, pharmacist, or health care provider.  2020 Elsevier/Gold Standard (2011-05-07  17:14:35) Coronavirus (COVID-19) Are you at risk?  Are you at risk for the Coronavirus (COVID-19)?  To be considered HIGH RISK for Coronavirus (COVID-19), you have to meet the following criteria:  . Traveled to [place], [place], [place], [place] or [place]; or in the [place] to [place], [place], [place], or [place]; and have fever, cough, and shortness of breath within the last 2 weeks of travel OR . Been in close contact with a person diagnosed with COVID-19 within the last 2 weeks and have fever, cough, and shortness of breath . IF YOU DO NOT MEET THESE CRITERIA, YOU ARE CONSIDERED LOW RISK FOR COVID-19.  What to do if you are HIGH RISK for COVID-19?  . If you are having a medical emergency, call 911. . Seek medical care right away. Before you go to a doctor's office, urgent care or emergency department, call ahead and tell them about your recent travel, contact with someone diagnosed with COVID-19, and your symptoms. You should receive instructions from your physician's office regarding next steps of care.  . When you arrive at healthcare provider, tell the healthcare staff immediately you have returned from visiting [place], [place], [place], [place] or [place]; or traveled in the [place] to [place], [place], [place], or [place]; in the last two weeks or you have been in close contact with a person diagnosed with COVID-19 in the last 2 weeks.   . Tell the health care staff about your symptoms: fever, cough and shortness of breath. . After you have been seen by a medical provider, you will be either: o Tested for (COVID-19) and discharged home on quarantine except to seek medical care if symptoms worsen, and asked to  - Stay home and avoid contact with others until you get your results (4-5 days)  - Avoid travel on public transportation if possible (such as bus, train, or airplane) or o Sent to the Emergency Department by EMS for evaluation, COVID-19 testing, and  possible admission depending on your condition and test results.  What to do if you are LOW RISK for COVID-19?  Reduce your risk of any infection by using the same precautions used for avoiding the common cold or flu:  . Wash your hands often with soap and warm water for at least 20 seconds.  If soap and water are not readily available, use an alcohol-based hand sanitizer with at least 60% alcohol.  . If coughing or sneezing, cover your mouth and nose by coughing or sneezing into the elbow areas of your shirt or coat, into a tissue or into your sleeve (not your hands). . Avoid shaking hands with others and consider head nods or verbal greetings only. . Avoid touching your eyes, nose, or mouth with unwashed hands.  . Avoid close contact with people who are sick. . Avoid places or events with large numbers of people in one location, like concerts or sporting events. . Carefully consider travel plans you have or are making. . If you are planning any travel outside or inside the [place], visit the CDC's Travelers' Health webpage for the latest health notices. . If you have some symptoms but not all   symptoms, continue to monitor at home and seek medical attention if your symptoms worsen. . If you are having a medical emergency, call 911.   ADDITIONAL HEALTHCARE OPTIONS FOR PATIENTS  [facility] Tele[facility] / e-Visit: [email or web address]         [facility]: [phone]  [facility]: [phone]                   [facility]: [phone]  

## 2020-01-09 DIAGNOSIS — R310 Gross hematuria: Secondary | ICD-10-CM | POA: Diagnosis not present

## 2020-01-12 ENCOUNTER — Inpatient Hospital Stay: Payer: Medicare Other | Attending: Physician Assistant

## 2020-01-12 ENCOUNTER — Telehealth: Payer: Self-pay | Admitting: Internal Medicine

## 2020-01-12 DIAGNOSIS — D509 Iron deficiency anemia, unspecified: Secondary | ICD-10-CM | POA: Insufficient documentation

## 2020-01-12 NOTE — Telephone Encounter (Signed)
Called pt per schedule message - no answer. Left message for pt to call back to r/c

## 2020-01-15 DIAGNOSIS — M48062 Spinal stenosis, lumbar region with neurogenic claudication: Secondary | ICD-10-CM | POA: Diagnosis not present

## 2020-01-19 ENCOUNTER — Other Ambulatory Visit: Payer: Self-pay

## 2020-01-19 ENCOUNTER — Inpatient Hospital Stay: Payer: Medicare Other

## 2020-01-19 VITALS — BP 143/61 | HR 62 | Temp 98.3°F | Resp 18

## 2020-01-19 DIAGNOSIS — D509 Iron deficiency anemia, unspecified: Secondary | ICD-10-CM | POA: Diagnosis not present

## 2020-01-19 MED ORDER — ACETAMINOPHEN 325 MG PO TABS
650.0000 mg | ORAL_TABLET | Freq: Once | ORAL | Status: AC
  Administered 2020-01-19: 650 mg via ORAL

## 2020-01-19 MED ORDER — SODIUM CHLORIDE 0.9 % IV SOLN
200.0000 mg | Freq: Once | INTRAVENOUS | Status: AC
  Administered 2020-01-19: 200 mg via INTRAVENOUS
  Filled 2020-01-19: qty 200

## 2020-01-19 MED ORDER — DIPHENHYDRAMINE HCL 25 MG PO CAPS
ORAL_CAPSULE | ORAL | Status: AC
  Filled 2020-01-19: qty 1

## 2020-01-19 MED ORDER — DIPHENHYDRAMINE HCL 25 MG PO CAPS
50.0000 mg | ORAL_CAPSULE | Freq: Once | ORAL | Status: AC
  Administered 2020-01-19: 25 mg via ORAL

## 2020-01-19 MED ORDER — ACETAMINOPHEN 325 MG PO TABS
ORAL_TABLET | ORAL | Status: AC
  Filled 2020-01-19: qty 2

## 2020-01-19 MED ORDER — SODIUM CHLORIDE 0.9 % IV SOLN
Freq: Once | INTRAVENOUS | Status: AC
  Filled 2020-01-19: qty 250

## 2020-01-19 NOTE — Progress Notes (Signed)
Patient requested for decreased benadryl dose due to "it makes me too sleepy and I have stuff to do today".  Cassie H, PA notified. Ok given for patient to receive Benadryl 25mg  instead of 50mg .  Educated patient that Benadryl is to help decrease her risk of having a reaction to the iron. Patient verbalized understanding and agreed to proceed with decreased dose.   Patient declined to stay for 30 minute post observation period. VSS upon leaving infusion area.

## 2020-01-19 NOTE — Patient Instructions (Signed)

## 2020-01-26 DIAGNOSIS — N200 Calculus of kidney: Secondary | ICD-10-CM | POA: Diagnosis not present

## 2020-01-26 DIAGNOSIS — R310 Gross hematuria: Secondary | ICD-10-CM | POA: Diagnosis not present

## 2020-02-05 DIAGNOSIS — M48062 Spinal stenosis, lumbar region with neurogenic claudication: Secondary | ICD-10-CM | POA: Diagnosis not present

## 2020-02-05 DIAGNOSIS — M5136 Other intervertebral disc degeneration, lumbar region: Secondary | ICD-10-CM | POA: Diagnosis not present

## 2020-02-05 DIAGNOSIS — M47816 Spondylosis without myelopathy or radiculopathy, lumbar region: Secondary | ICD-10-CM | POA: Diagnosis not present

## 2020-02-07 DIAGNOSIS — N183 Chronic kidney disease, stage 3 unspecified: Secondary | ICD-10-CM | POA: Diagnosis not present

## 2020-02-07 DIAGNOSIS — M549 Dorsalgia, unspecified: Secondary | ICD-10-CM | POA: Diagnosis not present

## 2020-02-07 DIAGNOSIS — M47816 Spondylosis without myelopathy or radiculopathy, lumbar region: Secondary | ICD-10-CM | POA: Diagnosis not present

## 2020-02-07 DIAGNOSIS — E1165 Type 2 diabetes mellitus with hyperglycemia: Secondary | ICD-10-CM | POA: Diagnosis not present

## 2020-02-07 DIAGNOSIS — D509 Iron deficiency anemia, unspecified: Secondary | ICD-10-CM | POA: Diagnosis not present

## 2020-02-19 ENCOUNTER — Ambulatory Visit: Payer: Medicare Other | Admitting: Internal Medicine

## 2020-02-19 ENCOUNTER — Other Ambulatory Visit: Payer: Medicare Other

## 2020-02-20 DIAGNOSIS — M47816 Spondylosis without myelopathy or radiculopathy, lumbar region: Secondary | ICD-10-CM | POA: Diagnosis not present

## 2020-02-26 ENCOUNTER — Inpatient Hospital Stay: Payer: Medicare Other | Admitting: Internal Medicine

## 2020-02-26 ENCOUNTER — Inpatient Hospital Stay: Payer: Medicare Other

## 2020-03-05 ENCOUNTER — Inpatient Hospital Stay: Payer: Medicare Other | Attending: Physician Assistant

## 2020-03-05 ENCOUNTER — Telehealth: Payer: Self-pay | Admitting: Internal Medicine

## 2020-03-05 ENCOUNTER — Other Ambulatory Visit: Payer: Self-pay

## 2020-03-05 ENCOUNTER — Inpatient Hospital Stay (HOSPITAL_BASED_OUTPATIENT_CLINIC_OR_DEPARTMENT_OTHER): Payer: Medicare Other | Admitting: Internal Medicine

## 2020-03-05 ENCOUNTER — Encounter: Payer: Self-pay | Admitting: Internal Medicine

## 2020-03-05 ENCOUNTER — Other Ambulatory Visit: Payer: Self-pay | Admitting: Internal Medicine

## 2020-03-05 VITALS — BP 132/70 | HR 79 | Temp 97.8°F | Resp 18 | Ht 63.0 in | Wt 306.8 lb

## 2020-03-05 DIAGNOSIS — D500 Iron deficiency anemia secondary to blood loss (chronic): Secondary | ICD-10-CM

## 2020-03-05 DIAGNOSIS — K219 Gastro-esophageal reflux disease without esophagitis: Secondary | ICD-10-CM | POA: Insufficient documentation

## 2020-03-05 DIAGNOSIS — Z87442 Personal history of urinary calculi: Secondary | ICD-10-CM | POA: Insufficient documentation

## 2020-03-05 DIAGNOSIS — Z7984 Long term (current) use of oral hypoglycemic drugs: Secondary | ICD-10-CM | POA: Diagnosis not present

## 2020-03-05 DIAGNOSIS — E119 Type 2 diabetes mellitus without complications: Secondary | ICD-10-CM | POA: Diagnosis not present

## 2020-03-05 DIAGNOSIS — I10 Essential (primary) hypertension: Secondary | ICD-10-CM | POA: Diagnosis not present

## 2020-03-05 DIAGNOSIS — K589 Irritable bowel syndrome without diarrhea: Secondary | ICD-10-CM | POA: Insufficient documentation

## 2020-03-05 DIAGNOSIS — J45909 Unspecified asthma, uncomplicated: Secondary | ICD-10-CM | POA: Diagnosis not present

## 2020-03-05 DIAGNOSIS — D509 Iron deficiency anemia, unspecified: Secondary | ICD-10-CM | POA: Insufficient documentation

## 2020-03-05 DIAGNOSIS — F419 Anxiety disorder, unspecified: Secondary | ICD-10-CM | POA: Diagnosis not present

## 2020-03-05 DIAGNOSIS — Z87440 Personal history of urinary (tract) infections: Secondary | ICD-10-CM | POA: Diagnosis not present

## 2020-03-05 DIAGNOSIS — E785 Hyperlipidemia, unspecified: Secondary | ICD-10-CM | POA: Diagnosis not present

## 2020-03-05 DIAGNOSIS — Z79899 Other long term (current) drug therapy: Secondary | ICD-10-CM | POA: Diagnosis not present

## 2020-03-05 DIAGNOSIS — Z7951 Long term (current) use of inhaled steroids: Secondary | ICD-10-CM | POA: Diagnosis not present

## 2020-03-05 LAB — CBC WITH DIFFERENTIAL (CANCER CENTER ONLY)
Abs Immature Granulocytes: 0.01 10*3/uL (ref 0.00–0.07)
Basophils Absolute: 0 10*3/uL (ref 0.0–0.1)
Basophils Relative: 1 %
Eosinophils Absolute: 0.5 10*3/uL (ref 0.0–0.5)
Eosinophils Relative: 7 %
HCT: 37 % (ref 36.0–46.0)
Hemoglobin: 11.1 g/dL — ABNORMAL LOW (ref 12.0–15.0)
Immature Granulocytes: 0 %
Lymphocytes Relative: 22 %
Lymphs Abs: 1.4 10*3/uL (ref 0.7–4.0)
MCH: 25.1 pg — ABNORMAL LOW (ref 26.0–34.0)
MCHC: 30 g/dL (ref 30.0–36.0)
MCV: 83.5 fL (ref 80.0–100.0)
Monocytes Absolute: 0.3 10*3/uL (ref 0.1–1.0)
Monocytes Relative: 5 %
Neutro Abs: 4.1 10*3/uL (ref 1.7–7.7)
Neutrophils Relative %: 65 %
Platelet Count: 204 10*3/uL (ref 150–400)
RBC: 4.43 MIL/uL (ref 3.87–5.11)
RDW: 20.8 % — ABNORMAL HIGH (ref 11.5–15.5)
WBC Count: 6.4 10*3/uL (ref 4.0–10.5)
nRBC: 0 % (ref 0.0–0.2)

## 2020-03-05 LAB — IRON AND TIBC
Iron: 30 ug/dL — ABNORMAL LOW (ref 41–142)
Saturation Ratios: 9 % — ABNORMAL LOW (ref 21–57)
TIBC: 341 ug/dL (ref 236–444)
UIBC: 311 ug/dL (ref 120–384)

## 2020-03-05 LAB — FERRITIN: Ferritin: 9 ng/mL — ABNORMAL LOW (ref 11–307)

## 2020-03-05 NOTE — Telephone Encounter (Signed)
Scheduled per 7/27 los. Printed avs and calendar for pt.  

## 2020-03-05 NOTE — Progress Notes (Signed)
[facility] Telephone:(336) [phone]   Fax:(336) [phone]  OFFICE PROGRESS NOTE  Elenore Paddy, NP [place] [id number]  DIAGNOSIS: Iron deficiency anemia with intolerance to oral iron tablets  PRIOR THERAPY: Status post Venofer infusion weekly for 4 weeks.  CURRENT THERAPY: None.  INTERVAL HISTORY: Gina [name] 54 y.o. female returns to the clinic today for follow-up visit.  The patient is feeling much better today after she received iron infusion.  She denied having any fatigue or weakness.  She denied having any dizzy spells.  She has no chest pain, shortness of breath, cough or hemoptysis.  She has no nausea, vomiting, diarrhea or constipation.  She is here today for evaluation and repeat CBC, iron study and ferritin.  MEDICAL HISTORY: Past Medical History:  Diagnosis Date  . Anemia   . Anxiety   . Arthritis   . Asthma   . Back pain   . Chronic pain   . Diabetes mellitus    type 2  . Family history of adverse reaction to anesthesia    youngest daughter hard to wake up both daughters post op n/v  . Fibromyalgia   . GERD (gastroesophageal reflux disease)   . Headache    migraines  . History of kidney stones    2 big stones in left kidney now  . History of recurrent UTIs    last uti 3 weeks ago  . Hyperlipidemia   . Hypertension   . IBS (irritable bowel syndrome)   . Obesities, morbid ([facility])   . Pneumonia 06/2016  . PONV (postoperative nausea and vomiting) 2004   came back after hystectomy bowels locked up on life support for 1 month   . Sinusitis   . 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 Umbilical hernia   . Wound discharge    open wound right leg going to wound center changes dressing qday with santyl cream quarter size wound    ALLERGIES:  is allergic to darvocet [propoxyphene n-acetaminophen], morphine and related, penicillins, and tape.  MEDICATIONS:  Current Outpatient Medications  Medication  Sig Dispense Refill  . ALPRAZolam (XANAX) 1 MG tablet Take 1 mg by mouth 2 (two) times daily as needed for anxiety.     Marland Kitchen atorvastatin (LIPITOR) 20 MG tablet TAKE 1 TABLET (20 MG TOTAL) DAILY BY MOUTH. 90 tablet 2  . cyclobenzaprine (FLEXERIL) 10 MG tablet Take 10 mg by mouth 3 (three) times daily as needed for muscle spasms.    . fluticasone (FLONASE) 50 MCG/ACT nasal spray Place 1 spray into both nostrils daily.    . Fluticasone-Salmeterol (WIXELA INHUB) 250-50 MCG/DOSE AEPB Inhale 1 puff into the lungs 2 (two) times daily. 60 each 1  . glucose blood (ONE TOUCH ULTRA TEST) test strip USE TO TEST BLOOD SUGAR 3 TIMES DAILY 100 each 3  . HYDROcodone-acetaminophen (NORCO) 7.5-325 MG tablet Take 1 tablet by mouth every 6 (six) hours as needed. Used for Kidney stones and recurrent UTI's    . lisinopril (ZESTRIL) 10 MG tablet Take 10 mg by mouth daily.    . metFORMIN (GLUCOPHAGE) 850 MG tablet TAKE 1 TABLET (850 MG TOTAL) BY MOUTH 2 (TWO) TIMES DAILY WITH A MEAL. (Patient taking differently: Take 850 mg by mouth daily. 1/2 tablet daily) 180 tablet 2  . nystatin (NYSTATIN) powder Apply topically 3 (three) times daily as needed. 60 g 6  . omeprazole (  PRILOSEC) 20 MG capsule TAKE 1 CAPSULE (20 MG TOTAL) BY MOUTH DAILY BEFORE BREAKFAST. 90 capsule 2  . OZEMPIC, 0.25 OR 0.5 MG/DOSE, 2 MG/1.5ML SOPN Inject 2 mg into the skin once a week.    . pregabalin (LYRICA) 200 MG capsule TAKE 1 CAPSULE BY MOUTH THREE TIMES A DAY 90 capsule 2  . promethazine (PHENERGAN) 25 MG tablet Take by mouth as needed.     . VENTOLIN HFA 108 (90 Base) MCG/ACT inhaler INHALE 2 PUFFS EVERY 6 (SIX) HOURS AS NEEDED INTO THE LUNGS FOR WHEEZING OR SHORTNESS OF BREATH. 18 Inhaler 4  . NOVOLOG MIX 70/30 FLEXPEN (70-30) 100 UNIT/ML FlexPen INJECT 12 UNITS TWO TIMES DAILY (Patient not taking: Reported on 03/05/2020) 15 mL 3   No current facility-administered medications for this visit.    SURGICAL HISTORY:  Past Surgical History:   Procedure Laterality Date  . ABDOMINAL HYSTERECTOMY     1 ovary left  . CARPAL TUNNEL RELEASE Right   . CESAREAN SECTION     x 2  . CHOLECYSTECTOMY    . COLONOSCOPY WITH PROPOFOL N/A 12/03/2016   Procedure: COLONOSCOPY WITH PROPOFOL;  Surgeon: Doran Stabler, MD;  Location: [facility];  Service: Gastroenterology;  Laterality: N/A;  . CYSTECTOMY     left foot  . ESOPHAGOGASTRODUODENOSCOPY (EGD) WITH PROPOFOL N/A 12/03/2016   Procedure: ESOPHAGOGASTRODUODENOSCOPY (EGD) WITH PROPOFOL;  Surgeon: Doran Stabler, MD;  Location: [facility];  Service: Gastroenterology;  Laterality: N/A;  . LITHOTRIPSY    . MULTIPLE TOOTH EXTRACTIONS      REVIEW OF SYSTEMS:  A comprehensive review of systems was negative.   PHYSICAL EXAMINATION: General appearance: alert, cooperative and no distress Head: Normocephalic, without obvious abnormality, atraumatic Neck: no adenopathy, no JVD, supple, symmetrical, trachea midline and thyroid not enlarged, symmetric, no tenderness/mass/nodules Lymph nodes: Cervical, supraclavicular, and axillary nodes normal. Resp: clear to auscultation bilaterally Back: symmetric, no curvature. ROM normal. No CVA tenderness. Cardio: regular rate and rhythm, S1, S2 normal, no murmur, click, rub or gallop GI: soft, non-tender; bowel sounds normal; no masses,  no organomegaly Extremities: extremities normal, atraumatic, no cyanosis or edema  ECOG PERFORMANCE STATUS: 1 - Symptomatic but completely ambulatory  Blood pressure (!) 132/70, pulse 79, temperature 97.8 F (36.6 C), temperature source Temporal, resp. rate 18, height 5\' 3"  (1.6 m), weight (!) 306 lb 12.8 oz (139.2 kg), SpO2 98 %.  LABORATORY DATA: Lab Results  Component Value Date   WBC 6.4 03/05/2020   HGB 11.1 (L) 03/05/2020   HCT 37.0 03/05/2020   MCV 83.5 03/05/2020   PLT 204 03/05/2020      Chemistry      Component Value Date/Time   NA 140 01/17/2018 1242   NA 141 04/23/2014 1104   K 4.8  01/17/2018 1242   K 4.3 04/23/2014 1104   CL 100 01/17/2018 1242   CO2 32 01/17/2018 1242   CO2 26 04/23/2014 1104   BUN 12 01/17/2018 1242   BUN 15.8 04/23/2014 1104   CREATININE 0.86 01/17/2018 1242   CREATININE 1.0 04/23/2014 1104      Component Value Date/Time   CALCIUM 10.0 01/17/2018 1242   CALCIUM 9.7 04/23/2014 1104   ALKPHOS 74 02/12/2017 0333   ALKPHOS 89 04/23/2014 1104   AST 31 02/12/2017 0333   AST 20 04/23/2014 1104   ALT 30 02/12/2017 0333   ALT 21 04/23/2014 1104   BILITOT 0.4 02/12/2017 0333   BILITOT 0.29 04/23/2014 1104  RADIOGRAPHIC STUDIES: No results found.  ASSESSMENT AND PLAN: This is a very pleasant 54 years old white female with iron deficiency anemia secondary to dietary deficiency with intolerance to the oral iron tablets. The patient was treated recently with iron infusion with Venofer weekly x4 doses. She is feeling much better and her CBC showed improvement of her hemoglobin and hematocrit but she still have mild anemia. The iron study and ferritin are still pending.  I recommended for the patient to continue on observation with repeat CBC, iron study and ferritin in 3 months. If the iron study showed persistent iron deficiency, I will arrange for the patient to receive iron infusion before her next visit. She was advised to call immediately if she has any other concerning symptoms in the interval. The patient voices understanding of current disease status and treatment options and is in agreement with the current care plan.  All questions were answered. The patient knows to call the clinic with any problems, questions or concerns. We can certainly see the patient much sooner if necessary.  Disclaimer: This note was dictated with voice recognition software. Similar sounding words can inadvertently be transcribed and may not be corrected upon review.

## 2020-03-06 ENCOUNTER — Telehealth: Payer: Self-pay

## 2020-03-06 ENCOUNTER — Telehealth: Payer: Self-pay | Admitting: Internal Medicine

## 2020-03-06 NOTE — Telephone Encounter (Signed)
Scheduled appt per 7/28 sch msg - pt is aware of appts.

## 2020-03-06 NOTE — Telephone Encounter (Signed)
TC to pt per Cassie to let her know Dr. Julien Nordmann wanted her to get more IV iron and that someone from scheduling should be contacting her soon. Cassie PA I already sent the scheduling message to arrange for iron infusions starting again next week. Pt verbalized understanding.

## 2020-03-15 ENCOUNTER — Other Ambulatory Visit: Payer: Self-pay

## 2020-03-15 ENCOUNTER — Inpatient Hospital Stay: Payer: Medicare Other | Attending: Physician Assistant

## 2020-03-15 VITALS — BP 138/62 | HR 69 | Temp 98.1°F | Resp 18 | Ht 63.0 in | Wt 300.8 lb

## 2020-03-15 DIAGNOSIS — J9622 Acute and chronic respiratory failure with hypercapnia: Secondary | ICD-10-CM | POA: Diagnosis not present

## 2020-03-15 DIAGNOSIS — E872 Acidosis: Secondary | ICD-10-CM | POA: Diagnosis not present

## 2020-03-15 DIAGNOSIS — E875 Hyperkalemia: Secondary | ICD-10-CM | POA: Diagnosis not present

## 2020-03-15 DIAGNOSIS — Z1629 Resistance to other single specified antibiotic: Secondary | ICD-10-CM | POA: Diagnosis not present

## 2020-03-15 DIAGNOSIS — J690 Pneumonitis due to inhalation of food and vomit: Secondary | ICD-10-CM | POA: Diagnosis not present

## 2020-03-15 DIAGNOSIS — K429 Umbilical hernia without obstruction or gangrene: Secondary | ICD-10-CM | POA: Diagnosis not present

## 2020-03-15 DIAGNOSIS — A419 Sepsis, unspecified organism: Secondary | ICD-10-CM | POA: Diagnosis not present

## 2020-03-15 DIAGNOSIS — I700 Atherosclerosis of aorta: Secondary | ICD-10-CM | POA: Diagnosis not present

## 2020-03-15 DIAGNOSIS — G8929 Other chronic pain: Secondary | ICD-10-CM | POA: Diagnosis not present

## 2020-03-15 DIAGNOSIS — R109 Unspecified abdominal pain: Secondary | ICD-10-CM | POA: Diagnosis not present

## 2020-03-15 DIAGNOSIS — R0989 Other specified symptoms and signs involving the circulatory and respiratory systems: Secondary | ICD-10-CM | POA: Diagnosis not present

## 2020-03-15 DIAGNOSIS — G92 Toxic encephalopathy: Secondary | ICD-10-CM | POA: Diagnosis not present

## 2020-03-15 DIAGNOSIS — E1140 Type 2 diabetes mellitus with diabetic neuropathy, unspecified: Secondary | ICD-10-CM | POA: Diagnosis not present

## 2020-03-15 DIAGNOSIS — N136 Pyonephrosis: Secondary | ICD-10-CM | POA: Diagnosis not present

## 2020-03-15 DIAGNOSIS — E785 Hyperlipidemia, unspecified: Secondary | ICD-10-CM | POA: Diagnosis not present

## 2020-03-15 DIAGNOSIS — J9621 Acute and chronic respiratory failure with hypoxia: Secondary | ICD-10-CM | POA: Diagnosis not present

## 2020-03-15 DIAGNOSIS — I10 Essential (primary) hypertension: Secondary | ICD-10-CM | POA: Diagnosis not present

## 2020-03-15 DIAGNOSIS — B9620 Unspecified Escherichia coli [E. coli] as the cause of diseases classified elsewhere: Secondary | ICD-10-CM | POA: Diagnosis not present

## 2020-03-15 DIAGNOSIS — M47814 Spondylosis without myelopathy or radiculopathy, thoracic region: Secondary | ICD-10-CM | POA: Diagnosis not present

## 2020-03-15 DIAGNOSIS — E873 Alkalosis: Secondary | ICD-10-CM | POA: Diagnosis not present

## 2020-03-15 DIAGNOSIS — N179 Acute kidney failure, unspecified: Secondary | ICD-10-CM | POA: Diagnosis not present

## 2020-03-15 DIAGNOSIS — Z1639 Resistance to other specified antimicrobial drug: Secondary | ICD-10-CM | POA: Diagnosis not present

## 2020-03-15 DIAGNOSIS — Z20822 Contact with and (suspected) exposure to covid-19: Secondary | ICD-10-CM | POA: Diagnosis not present

## 2020-03-15 DIAGNOSIS — N132 Hydronephrosis with renal and ureteral calculous obstruction: Secondary | ICD-10-CM | POA: Diagnosis not present

## 2020-03-15 DIAGNOSIS — K7689 Other specified diseases of liver: Secondary | ICD-10-CM | POA: Diagnosis not present

## 2020-03-15 DIAGNOSIS — Z1611 Resistance to penicillins: Secondary | ICD-10-CM | POA: Diagnosis not present

## 2020-03-15 DIAGNOSIS — K219 Gastro-esophageal reflux disease without esophagitis: Secondary | ICD-10-CM | POA: Diagnosis not present

## 2020-03-15 DIAGNOSIS — R6520 Severe sepsis without septic shock: Secondary | ICD-10-CM | POA: Diagnosis not present

## 2020-03-15 DIAGNOSIS — E871 Hypo-osmolality and hyponatremia: Secondary | ICD-10-CM | POA: Diagnosis not present

## 2020-03-15 DIAGNOSIS — R6521 Severe sepsis with septic shock: Secondary | ICD-10-CM | POA: Diagnosis not present

## 2020-03-15 DIAGNOSIS — D509 Iron deficiency anemia, unspecified: Secondary | ICD-10-CM

## 2020-03-15 MED ORDER — ACETAMINOPHEN 325 MG PO TABS
650.0000 mg | ORAL_TABLET | Freq: Once | ORAL | Status: AC
  Administered 2020-03-15: 650 mg via ORAL

## 2020-03-15 MED ORDER — ACETAMINOPHEN 325 MG PO TABS
ORAL_TABLET | ORAL | Status: AC
  Filled 2020-03-15: qty 2

## 2020-03-15 MED ORDER — SODIUM CHLORIDE 0.9 % IV SOLN
Freq: Once | INTRAVENOUS | Status: AC
  Filled 2020-03-15: qty 250

## 2020-03-15 MED ORDER — DIPHENHYDRAMINE HCL 50 MG/ML IJ SOLN
25.0000 mg | Freq: Once | INTRAMUSCULAR | Status: AC
  Administered 2020-03-15: 25 mg via INTRAVENOUS

## 2020-03-15 MED ORDER — SODIUM CHLORIDE 0.9 % IV SOLN
125.0000 mg | Freq: Once | INTRAVENOUS | Status: AC
  Administered 2020-03-15: 125 mg via INTRAVENOUS
  Filled 2020-03-15: qty 10

## 2020-03-15 MED ORDER — DIPHENHYDRAMINE HCL 50 MG/ML IJ SOLN
INTRAMUSCULAR | Status: AC
  Filled 2020-03-15: qty 1

## 2020-03-15 NOTE — Patient Instructions (Signed)
Sodium Ferric Gluconate Complex injection What is this medicine? SODIUM FERRIC GLUCONATE COMPLEX (SOE dee um FER ik GLOO koe nate KOM pleks) is an iron replacement. It is used with epoetin therapy to treat low iron levels in patients who are receiving hemodialysis. This medicine may be used for other purposes; ask your health care provider or pharmacist if you have questions. COMMON BRAND NAME(S): Ferrlecit, Nulecit What should I tell my health care provider before I take this medicine? They need to know if you have any of the following conditions:  anemia that is not from iron deficiency  high levels of iron in the body  an unusual or allergic reaction to iron, benzyl alcohol, other medicines, foods, dyes, or preservatives  pregnant or are trying to become pregnant  breast-feeding How should I use this medicine? This medicine is for infusion into a vein. It is given by a health care professional in a hospital or clinic setting. Talk to your pediatrician regarding the use of this medicine in children. While this drug may be prescribed for children as young as 6 years old for selected conditions, precautions do apply. Overdosage: If you think you have taken too much of this medicine contact a poison control center or emergency room at once. NOTE: This medicine is only for you. Do not share this medicine with others. What if I miss a dose? It is important not to miss your dose. Call your doctor or health care professional if you are unable to keep an appointment. What may interact with this medicine? Do not take this medicine with any of the following medications:  deferoxamine  dimercaprol  other iron products This medicine may also interact with the following medications:  chloramphenicol  deferasirox  medicine for blood pressure like enalapril This list may not describe all possible interactions. Give your health care provider a list of all the medicines, herbs,  non-prescription drugs, or dietary supplements you use. Also tell them if you smoke, drink alcohol, or use illegal drugs. Some items may interact with your medicine. What should I watch for while using this medicine? Your condition will be monitored carefully while you are receiving this medicine. Visit your doctor for check-ups as directed. What side effects may I notice from receiving this medicine? Side effects that you should report to your doctor or health care professional as soon as possible:  allergic reactions like skin rash, itching or hives, swelling of the face, lips, or tongue  breathing problems  changes in hearing  changes in vision  chills, flushing, or sweating  fast, irregular heartbeat  feeling faint or lightheaded, falls  fever, flu-like symptoms  high or low blood pressure  pain, tingling, numbness in the hands or feet  severe pain in the chest, back, flanks, or groin  swelling of the ankles, feet, hands  trouble passing urine or change in the amount of urine  unusually weak or tired Side effects that usually do not require medical attention (report to your doctor or health care professional if they continue or are bothersome):  cramps  dark colored stools  diarrhea  headache  nausea, vomiting  stomach upset This list may not describe all possible side effects. Call your doctor for medical advice about side effects. You may report side effects to FDA at 1-800-FDA-1088. Where should I keep my medicine? This drug is given in a hospital or clinic and will not be stored at home. NOTE: This sheet is a summary. It may not cover all   possible information. If you have questions about this medicine, talk to your doctor, pharmacist, or health care provider.  2020 Elsevier/Gold Standard (2008-03-28 15:58:57)  

## 2020-03-18 ENCOUNTER — Encounter (HOSPITAL_COMMUNITY)
Admission: EM | Disposition: A | Discharge status: Home Health | Payer: Self-pay | Source: Home / Self Care | Attending: Family Medicine

## 2020-03-18 ENCOUNTER — Emergency Department (HOSPITAL_COMMUNITY): Payer: Medicare Other | Admitting: Certified Registered Nurse Anesthetist

## 2020-03-18 ENCOUNTER — Emergency Department (HOSPITAL_COMMUNITY): Payer: Medicare Other

## 2020-03-18 ENCOUNTER — Encounter (HOSPITAL_COMMUNITY): Payer: Self-pay

## 2020-03-18 ENCOUNTER — Other Ambulatory Visit: Payer: Self-pay

## 2020-03-18 ENCOUNTER — Inpatient Hospital Stay (HOSPITAL_COMMUNITY)
Admission: EM | Admit: 2020-03-18 | Discharge: 2020-03-26 | DRG: 853 | Disposition: A | Discharge status: Home Health | Payer: Medicare Other | Attending: Family Medicine | Admitting: Family Medicine

## 2020-03-18 DIAGNOSIS — J9622 Acute and chronic respiratory failure with hypercapnia: Secondary | ICD-10-CM | POA: Diagnosis not present

## 2020-03-18 DIAGNOSIS — R6520 Severe sepsis without septic shock: Secondary | ICD-10-CM | POA: Diagnosis not present

## 2020-03-18 DIAGNOSIS — N10 Acute tubulo-interstitial nephritis: Secondary | ICD-10-CM | POA: Diagnosis not present

## 2020-03-18 DIAGNOSIS — N132 Hydronephrosis with renal and ureteral calculous obstruction: Secondary | ICD-10-CM | POA: Diagnosis not present

## 2020-03-18 DIAGNOSIS — R062 Wheezing: Secondary | ICD-10-CM | POA: Diagnosis not present

## 2020-03-18 DIAGNOSIS — T17908A Unspecified foreign body in respiratory tract, part unspecified causing other injury, initial encounter: Secondary | ICD-10-CM | POA: Diagnosis not present

## 2020-03-18 DIAGNOSIS — R109 Unspecified abdominal pain: Secondary | ICD-10-CM | POA: Diagnosis present

## 2020-03-18 DIAGNOSIS — Z885 Allergy status to narcotic agent status: Secondary | ICD-10-CM

## 2020-03-18 DIAGNOSIS — E785 Hyperlipidemia, unspecified: Secondary | ICD-10-CM | POA: Diagnosis not present

## 2020-03-18 DIAGNOSIS — Z1639 Resistance to other specified antimicrobial drug: Secondary | ICD-10-CM | POA: Diagnosis present

## 2020-03-18 DIAGNOSIS — J690 Pneumonitis due to inhalation of food and vomit: Secondary | ICD-10-CM | POA: Diagnosis not present

## 2020-03-18 DIAGNOSIS — E872 Acidosis: Secondary | ICD-10-CM | POA: Diagnosis not present

## 2020-03-18 DIAGNOSIS — R6521 Severe sepsis with septic shock: Secondary | ICD-10-CM | POA: Diagnosis present

## 2020-03-18 DIAGNOSIS — D509 Iron deficiency anemia, unspecified: Secondary | ICD-10-CM | POA: Diagnosis present

## 2020-03-18 DIAGNOSIS — E871 Hypo-osmolality and hyponatremia: Secondary | ICD-10-CM | POA: Diagnosis present

## 2020-03-18 DIAGNOSIS — G8929 Other chronic pain: Secondary | ICD-10-CM | POA: Diagnosis present

## 2020-03-18 DIAGNOSIS — Z20822 Contact with and (suspected) exposure to covid-19: Secondary | ICD-10-CM | POA: Diagnosis not present

## 2020-03-18 DIAGNOSIS — Z1611 Resistance to penicillins: Secondary | ICD-10-CM | POA: Diagnosis present

## 2020-03-18 DIAGNOSIS — Z79899 Other long term (current) drug therapy: Secondary | ICD-10-CM

## 2020-03-18 DIAGNOSIS — J45909 Unspecified asthma, uncomplicated: Secondary | ICD-10-CM | POA: Diagnosis present

## 2020-03-18 DIAGNOSIS — J9601 Acute respiratory failure with hypoxia: Secondary | ICD-10-CM

## 2020-03-18 DIAGNOSIS — D6959 Other secondary thrombocytopenia: Secondary | ICD-10-CM | POA: Diagnosis present

## 2020-03-18 DIAGNOSIS — A419 Sepsis, unspecified organism: Secondary | ICD-10-CM | POA: Diagnosis not present

## 2020-03-18 DIAGNOSIS — N200 Calculus of kidney: Secondary | ICD-10-CM | POA: Diagnosis not present

## 2020-03-18 DIAGNOSIS — T50915A Adverse effect of multiple unspecified drugs, medicaments and biological substances, initial encounter: Secondary | ICD-10-CM | POA: Diagnosis present

## 2020-03-18 DIAGNOSIS — E876 Hypokalemia: Secondary | ICD-10-CM | POA: Diagnosis not present

## 2020-03-18 DIAGNOSIS — K219 Gastro-esophageal reflux disease without esophagitis: Secondary | ICD-10-CM | POA: Diagnosis not present

## 2020-03-18 DIAGNOSIS — G629 Polyneuropathy, unspecified: Secondary | ICD-10-CM

## 2020-03-18 DIAGNOSIS — I700 Atherosclerosis of aorta: Secondary | ICD-10-CM | POA: Diagnosis not present

## 2020-03-18 DIAGNOSIS — J9621 Acute and chronic respiratory failure with hypoxia: Secondary | ICD-10-CM | POA: Diagnosis present

## 2020-03-18 DIAGNOSIS — E875 Hyperkalemia: Secondary | ICD-10-CM | POA: Diagnosis present

## 2020-03-18 DIAGNOSIS — Z8249 Family history of ischemic heart disease and other diseases of the circulatory system: Secondary | ICD-10-CM

## 2020-03-18 DIAGNOSIS — R0602 Shortness of breath: Secondary | ICD-10-CM | POA: Diagnosis not present

## 2020-03-18 DIAGNOSIS — G92 Toxic encephalopathy: Secondary | ICD-10-CM | POA: Diagnosis present

## 2020-03-18 DIAGNOSIS — G43909 Migraine, unspecified, not intractable, without status migrainosus: Secondary | ICD-10-CM | POA: Diagnosis present

## 2020-03-18 DIAGNOSIS — K760 Fatty (change of) liver, not elsewhere classified: Secondary | ICD-10-CM | POA: Diagnosis present

## 2020-03-18 DIAGNOSIS — E873 Alkalosis: Secondary | ICD-10-CM | POA: Diagnosis not present

## 2020-03-18 DIAGNOSIS — M797 Fibromyalgia: Secondary | ICD-10-CM | POA: Diagnosis present

## 2020-03-18 DIAGNOSIS — R918 Other nonspecific abnormal finding of lung field: Secondary | ICD-10-CM | POA: Diagnosis not present

## 2020-03-18 DIAGNOSIS — G4733 Obstructive sleep apnea (adult) (pediatric): Secondary | ICD-10-CM | POA: Diagnosis present

## 2020-03-18 DIAGNOSIS — Z720 Tobacco use: Secondary | ICD-10-CM | POA: Diagnosis not present

## 2020-03-18 DIAGNOSIS — Z794 Long term (current) use of insulin: Secondary | ICD-10-CM

## 2020-03-18 DIAGNOSIS — R0989 Other specified symptoms and signs involving the circulatory and respiratory systems: Secondary | ICD-10-CM | POA: Diagnosis not present

## 2020-03-18 DIAGNOSIS — N201 Calculus of ureter: Secondary | ICD-10-CM | POA: Diagnosis not present

## 2020-03-18 DIAGNOSIS — M47814 Spondylosis without myelopathy or radiculopathy, thoracic region: Secondary | ICD-10-CM | POA: Diagnosis not present

## 2020-03-18 DIAGNOSIS — N136 Pyonephrosis: Secondary | ICD-10-CM | POA: Diagnosis present

## 2020-03-18 DIAGNOSIS — Z8619 Personal history of other infectious and parasitic diseases: Secondary | ICD-10-CM

## 2020-03-18 DIAGNOSIS — I10 Essential (primary) hypertension: Secondary | ICD-10-CM | POA: Diagnosis present

## 2020-03-18 DIAGNOSIS — J90 Pleural effusion, not elsewhere classified: Secondary | ICD-10-CM

## 2020-03-18 DIAGNOSIS — K589 Irritable bowel syndrome without diarrhea: Secondary | ICD-10-CM | POA: Diagnosis present

## 2020-03-18 DIAGNOSIS — F17210 Nicotine dependence, cigarettes, uncomplicated: Secondary | ICD-10-CM | POA: Diagnosis present

## 2020-03-18 DIAGNOSIS — Z87442 Personal history of urinary calculi: Secondary | ICD-10-CM

## 2020-03-18 DIAGNOSIS — F419 Anxiety disorder, unspecified: Secondary | ICD-10-CM | POA: Diagnosis present

## 2020-03-18 DIAGNOSIS — N179 Acute kidney failure, unspecified: Secondary | ICD-10-CM | POA: Diagnosis not present

## 2020-03-18 DIAGNOSIS — Z9119 Patient's noncompliance with other medical treatment and regimen: Secondary | ICD-10-CM

## 2020-03-18 DIAGNOSIS — E861 Hypovolemia: Secondary | ICD-10-CM | POA: Diagnosis present

## 2020-03-18 DIAGNOSIS — K7689 Other specified diseases of liver: Secondary | ICD-10-CM | POA: Diagnosis not present

## 2020-03-18 DIAGNOSIS — Z8701 Personal history of pneumonia (recurrent): Secondary | ICD-10-CM

## 2020-03-18 DIAGNOSIS — Z9109 Other allergy status, other than to drugs and biological substances: Secondary | ICD-10-CM

## 2020-03-18 DIAGNOSIS — B9620 Unspecified Escherichia coli [E. coli] as the cause of diseases classified elsewhere: Secondary | ICD-10-CM | POA: Diagnosis present

## 2020-03-18 DIAGNOSIS — Z1629 Resistance to other single specified antibiotic: Secondary | ICD-10-CM | POA: Diagnosis present

## 2020-03-18 DIAGNOSIS — A401 Sepsis due to streptococcus, group B: Secondary | ICD-10-CM | POA: Diagnosis not present

## 2020-03-18 DIAGNOSIS — M1990 Unspecified osteoarthritis, unspecified site: Secondary | ICD-10-CM | POA: Diagnosis present

## 2020-03-18 DIAGNOSIS — G894 Chronic pain syndrome: Secondary | ICD-10-CM | POA: Diagnosis present

## 2020-03-18 DIAGNOSIS — Z87440 Personal history of urinary (tract) infections: Secondary | ICD-10-CM

## 2020-03-18 DIAGNOSIS — Z91030 Bee allergy status: Secondary | ICD-10-CM

## 2020-03-18 DIAGNOSIS — Z833 Family history of diabetes mellitus: Secondary | ICD-10-CM

## 2020-03-18 DIAGNOSIS — K56609 Unspecified intestinal obstruction, unspecified as to partial versus complete obstruction: Secondary | ICD-10-CM

## 2020-03-18 DIAGNOSIS — E1140 Type 2 diabetes mellitus with diabetic neuropathy, unspecified: Secondary | ICD-10-CM | POA: Diagnosis present

## 2020-03-18 DIAGNOSIS — Z8614 Personal history of Methicillin resistant Staphylococcus aureus infection: Secondary | ICD-10-CM

## 2020-03-18 DIAGNOSIS — Z90710 Acquired absence of both cervix and uterus: Secondary | ICD-10-CM

## 2020-03-18 DIAGNOSIS — K429 Umbilical hernia without obstruction or gangrene: Secondary | ICD-10-CM | POA: Diagnosis not present

## 2020-03-18 DIAGNOSIS — Z880 Allergy status to penicillin: Secondary | ICD-10-CM

## 2020-03-18 DIAGNOSIS — Z9049 Acquired absence of other specified parts of digestive tract: Secondary | ICD-10-CM

## 2020-03-18 DIAGNOSIS — Z6841 Body Mass Index (BMI) 40.0 and over, adult: Secondary | ICD-10-CM

## 2020-03-18 DIAGNOSIS — R160 Hepatomegaly, not elsewhere classified: Secondary | ICD-10-CM | POA: Diagnosis present

## 2020-03-18 DIAGNOSIS — E8770 Fluid overload, unspecified: Secondary | ICD-10-CM | POA: Diagnosis not present

## 2020-03-18 DIAGNOSIS — I517 Cardiomegaly: Secondary | ICD-10-CM | POA: Diagnosis not present

## 2020-03-18 DIAGNOSIS — M549 Dorsalgia, unspecified: Secondary | ICD-10-CM | POA: Diagnosis present

## 2020-03-18 DIAGNOSIS — Z7951 Long term (current) use of inhaled steroids: Secondary | ICD-10-CM

## 2020-03-18 HISTORY — PX: CYSTOSCOPY/URETEROSCOPY/HOLMIUM LASER/STENT PLACEMENT: SHX6546

## 2020-03-18 LAB — URINALYSIS, ROUTINE W REFLEX MICROSCOPIC
Bilirubin Urine: NEGATIVE
Glucose, UA: NEGATIVE mg/dL
Ketones, ur: NEGATIVE mg/dL
Nitrite: POSITIVE — AB
Protein, ur: >=300 mg/dL — AB
RBC / HPF: >50 RBC/hpf — ABNORMAL HIGH (ref 0–5)
Specific Gravity, Urine: 1.015 (ref 1.005–1.030)
WBC, UA: >50 WBC/hpf — ABNORMAL HIGH (ref 0–5)
pH: 5 (ref 5.0–8.0)

## 2020-03-18 LAB — CBC WITH DIFFERENTIAL/PLATELET
Abs Immature Granulocytes: 0.09 10*3/uL — ABNORMAL HIGH (ref 0.00–0.07)
Basophils Absolute: 0 10*3/uL (ref 0.0–0.1)
Basophils Relative: 0 %
Eosinophils Absolute: 0 10*3/uL (ref 0.0–0.5)
Eosinophils Relative: 0 %
HCT: 40.1 % (ref 36.0–46.0)
Hemoglobin: 12.1 g/dL (ref 12.0–15.0)
Immature Granulocytes: 1 %
Lymphocytes Relative: 2 %
Lymphs Abs: 0.2 10*3/uL — ABNORMAL LOW (ref 0.7–4.0)
MCH: 25.5 pg — ABNORMAL LOW (ref 26.0–34.0)
MCHC: 30.2 g/dL (ref 30.0–36.0)
MCV: 84.6 fL (ref 80.0–100.0)
Monocytes Absolute: 0.4 10*3/uL (ref 0.1–1.0)
Monocytes Relative: 3 %
Neutro Abs: 13 10*3/uL — ABNORMAL HIGH (ref 1.7–7.7)
Neutrophils Relative %: 94 %
Platelets: 167 10*3/uL (ref 150–400)
RBC: 4.74 MIL/uL (ref 3.87–5.11)
RDW: 20.4 % — ABNORMAL HIGH (ref 11.5–15.5)
WBC: 13.8 10*3/uL — ABNORMAL HIGH (ref 4.0–10.5)
nRBC: 0 % (ref 0.0–0.2)

## 2020-03-18 LAB — I-STAT BETA HCG BLOOD, ED (MC, WL, AP ONLY): I-stat hCG, quantitative: <5 m[IU]/mL (ref <5)

## 2020-03-18 LAB — COMPREHENSIVE METABOLIC PANEL
ALT: 19 U/L (ref 0–44)
ALT: 21 U/L (ref 0–44)
AST: 21 U/L (ref 15–41)
AST: 27 U/L (ref 15–41)
Albumin: 3.5 g/dL (ref 3.5–5.0)
Albumin: 2.9 g/dL — ABNORMAL LOW (ref 3.5–5.0)
Alkaline Phosphatase: 76 U/L (ref 38–126)
Alkaline Phosphatase: 61 U/L (ref 38–126)
Anion gap: 12 (ref 5–15)
Anion gap: 8 (ref 5–15)
BUN: 27 mg/dL — ABNORMAL HIGH (ref 6–20)
BUN: 26 mg/dL — ABNORMAL HIGH (ref 6–20)
CO2: 19 mmol/L — ABNORMAL LOW (ref 22–32)
CO2: 21 mmol/L — ABNORMAL LOW (ref 22–32)
Calcium: 8.3 mg/dL — ABNORMAL LOW (ref 8.9–10.3)
Calcium: 7.6 mg/dL — ABNORMAL LOW (ref 8.9–10.3)
Chloride: 98 mmol/L (ref 98–111)
Chloride: 108 mmol/L (ref 98–111)
Creatinine, Ser: 1.87 mg/dL — ABNORMAL HIGH (ref 0.44–1.00)
Creatinine, Ser: 2 mg/dL — ABNORMAL HIGH (ref 0.44–1.00)
GFR calc Af Amer: 34 mL/min — ABNORMAL LOW (ref >60)
GFR calc Af Amer: 32 mL/min — ABNORMAL LOW (ref >60)
GFR calc non Af Amer: 30 mL/min — ABNORMAL LOW (ref >60)
GFR calc non Af Amer: 27 mL/min — ABNORMAL LOW (ref >60)
Glucose, Bld: 191 mg/dL — ABNORMAL HIGH (ref 70–99)
Glucose, Bld: 202 mg/dL — ABNORMAL HIGH (ref 70–99)
Potassium: 3.8 mmol/L (ref 3.5–5.1)
Potassium: 4.6 mmol/L (ref 3.5–5.1)
Sodium: 129 mmol/L — ABNORMAL LOW (ref 135–145)
Sodium: 137 mmol/L (ref 135–145)
Total Bilirubin: 0.8 mg/dL (ref 0.3–1.2)
Total Bilirubin: 0.9 mg/dL (ref 0.3–1.2)
Total Protein: 7 g/dL (ref 6.5–8.1)
Total Protein: 5.9 g/dL — ABNORMAL LOW (ref 6.5–8.1)

## 2020-03-18 LAB — LACTIC ACID, PLASMA
Lactic Acid, Venous: 2.2 mmol/L (ref 0.5–1.9)
Lactic Acid, Venous: 1.4 mmol/L (ref 0.5–1.9)
Lactic Acid, Venous: 1 mmol/L (ref 0.5–1.9)

## 2020-03-18 LAB — GLUCOSE, CAPILLARY
Glucose-Capillary: 174 mg/dL — ABNORMAL HIGH (ref 70–99)
Glucose-Capillary: 170 mg/dL — ABNORMAL HIGH (ref 70–99)
Glucose-Capillary: 208 mg/dL — ABNORMAL HIGH (ref 70–99)
Glucose-Capillary: 213 mg/dL — ABNORMAL HIGH (ref 70–99)
Glucose-Capillary: 244 mg/dL — ABNORMAL HIGH (ref 70–99)

## 2020-03-18 LAB — HIV ANTIBODY (ROUTINE TESTING W REFLEX): HIV Screen 4th Generation wRfx: NONREACTIVE

## 2020-03-18 LAB — SARS CORONAVIRUS 2 BY RT PCR (HOSPITAL ORDER, PERFORMED IN ~~LOC~~ HOSPITAL LAB): SARS Coronavirus 2: NEGATIVE

## 2020-03-18 LAB — HEMOGLOBIN A1C
Hgb A1c MFr Bld: 5.8 % — ABNORMAL HIGH (ref 4.8–5.6)
Mean Plasma Glucose: 119.76 mg/dL

## 2020-03-18 LAB — CBG MONITORING, ED: Glucose-Capillary: 175 mg/dL — ABNORMAL HIGH (ref 70–99)

## 2020-03-18 LAB — MRSA PCR SCREENING: MRSA by PCR: NEGATIVE

## 2020-03-18 LAB — MAGNESIUM: Magnesium: 1.3 mg/dL — ABNORMAL LOW (ref 1.7–2.4)

## 2020-03-18 SURGERY — CYSTOSCOPY/URETEROSCOPY/HOLMIUM LASER/STENT PLACEMENT
Anesthesia: General | Laterality: Left

## 2020-03-18 MED ORDER — HYDROMORPHONE HCL 1 MG/ML IJ SOLN
0.2500 mg | INTRAMUSCULAR | Status: DC | PRN
  Administered 2020-03-18 (×2): 0.25 mg via INTRAVENOUS

## 2020-03-18 MED ORDER — LIDOCAINE 2% (20 MG/ML) 5 ML SYRINGE
INTRAMUSCULAR | Status: DC | PRN
  Administered 2020-03-18: 80 mg via INTRAVENOUS

## 2020-03-18 MED ORDER — VASOPRESSIN 20 UNIT/ML IV SOLN
INTRAVENOUS | Status: AC
  Filled 2020-03-18: qty 1

## 2020-03-18 MED ORDER — SODIUM CHLORIDE 0.9 % IV SOLN
1.0000 g | INTRAVENOUS | Status: DC
  Administered 2020-03-18: 1 g via INTRAVENOUS
  Filled 2020-03-18: qty 10

## 2020-03-18 MED ORDER — SUCCINYLCHOLINE CHLORIDE 200 MG/10ML IV SOSY
PREFILLED_SYRINGE | INTRAVENOUS | Status: DC | PRN
  Administered 2020-03-18: 120 mg via INTRAVENOUS

## 2020-03-18 MED ORDER — HYDROMORPHONE HCL 1 MG/ML IJ SOLN
INTRAMUSCULAR | Status: AC
  Filled 2020-03-18: qty 1

## 2020-03-18 MED ORDER — ALBUTEROL SULFATE (2.5 MG/3ML) 0.083% IN NEBU: 2.5000 mg | INHALATION_SOLUTION | Freq: Four times a day (QID) | RESPIRATORY_TRACT | Status: DC | PRN

## 2020-03-18 MED ORDER — KETOROLAC TROMETHAMINE 30 MG/ML IJ SOLN
15.0000 mg | Freq: Once | INTRAMUSCULAR | Status: AC
  Administered 2020-03-18: 15 mg via INTRAVENOUS
  Filled 2020-03-18: qty 1

## 2020-03-18 MED ORDER — DEXAMETHASONE SODIUM PHOSPHATE 10 MG/ML IJ SOLN
INTRAMUSCULAR | Status: DC | PRN
  Administered 2020-03-18: 4 mg via INTRAVENOUS

## 2020-03-18 MED ORDER — VASOPRESSIN 20 UNIT/ML IV SOLN
INTRAVENOUS | Status: DC | PRN
  Administered 2020-03-18: 2 [IU] via INTRAVENOUS
  Administered 2020-03-18: 1 [IU] via INTRAVENOUS

## 2020-03-18 MED ORDER — AMISULPRIDE (ANTIEMETIC) 5 MG/2ML IV SOLN: 10.0000 mg | Freq: Once | INTRAVENOUS | Status: DC | PRN

## 2020-03-18 MED ORDER — SODIUM CHLORIDE 0.9 % IV BOLUS
1000.0000 mL | Freq: Once | INTRAVENOUS | Status: AC
  Administered 2020-03-18: 1000 mL via INTRAVENOUS

## 2020-03-18 MED ORDER — FENTANYL CITRATE (PF) 100 MCG/2ML IJ SOLN
12.5000 ug | INTRAMUSCULAR | Status: DC | PRN
  Administered 2020-03-18 – 2020-03-19 (×7): 12.5 ug via INTRAVENOUS
  Filled 2020-03-18 (×7): qty 2

## 2020-03-18 MED ORDER — SODIUM CHLORIDE 0.9 % IV BOLUS (SEPSIS)
1000.0000 mL | Freq: Once | INTRAVENOUS | Status: AC
  Administered 2020-03-18: 1000 mL via INTRAVENOUS

## 2020-03-18 MED ORDER — HYDROMORPHONE HCL 1 MG/ML IJ SOLN
1.0000 mg | Freq: Once | INTRAMUSCULAR | Status: AC
  Administered 2020-03-18: 1 mg via INTRAVENOUS
  Filled 2020-03-18: qty 1

## 2020-03-18 MED ORDER — IOHEXOL 300 MG/ML  SOLN
INTRAMUSCULAR | Status: DC | PRN
  Administered 2020-03-18: 10 mL

## 2020-03-18 MED ORDER — LACTATED RINGERS IV SOLN
INTRAVENOUS | Status: DC | PRN
Start: 2020-03-18 — End: 2020-03-18

## 2020-03-18 MED ORDER — CHLORHEXIDINE GLUCONATE CLOTH 2 % EX PADS
6.0000 | MEDICATED_PAD | Freq: Every day | CUTANEOUS | Status: DC
  Administered 2020-03-18 – 2020-03-26 (×8): 6 via TOPICAL

## 2020-03-18 MED ORDER — ATORVASTATIN CALCIUM 40 MG PO TABS
40.0000 mg | ORAL_TABLET | Freq: Every day | ORAL | Status: DC
  Administered 2020-03-18 – 2020-03-21 (×4): 40 mg via ORAL
  Filled 2020-03-18 (×5): qty 1

## 2020-03-18 MED ORDER — MIDAZOLAM HCL 2 MG/2ML IJ SOLN
INTRAMUSCULAR | Status: AC
  Filled 2020-03-18: qty 2

## 2020-03-18 MED ORDER — PREGABALIN 100 MG PO CAPS: 200.0000 mg | ORAL_CAPSULE | Freq: Three times a day (TID) | ORAL | Status: DC

## 2020-03-18 MED ORDER — ORAL CARE MOUTH RINSE
15.0000 mL | Freq: Two times a day (BID) | OROMUCOSAL | Status: DC
  Administered 2020-03-18 – 2020-03-26 (×12): 15 mL via OROMUCOSAL

## 2020-03-18 MED ORDER — PREGABALIN 100 MG PO CAPS
200.0000 mg | ORAL_CAPSULE | Freq: Three times a day (TID) | ORAL | Status: DC
  Administered 2020-03-18 – 2020-03-20 (×8): 200 mg via ORAL
  Filled 2020-03-18 (×8): qty 2

## 2020-03-18 MED ORDER — LACTATED RINGERS IV SOLN: INTRAVENOUS | Status: DC

## 2020-03-18 MED ORDER — ALPRAZOLAM 1 MG PO TABS
1.0000 mg | ORAL_TABLET | Freq: Two times a day (BID) | ORAL | Status: DC | PRN
  Administered 2020-03-19 – 2020-03-20 (×2): 1 mg via ORAL
  Filled 2020-03-18 (×3): qty 1

## 2020-03-18 MED ORDER — HEPARIN SODIUM (PORCINE) 5000 UNIT/ML IJ SOLN
5000.0000 [IU] | Freq: Three times a day (TID) | INTRAMUSCULAR | Status: DC
  Administered 2020-03-19 – 2020-03-26 (×21): 5000 [IU] via SUBCUTANEOUS
  Filled 2020-03-18 (×20): qty 1

## 2020-03-18 MED ORDER — ONDANSETRON HCL 4 MG/2ML IJ SOLN
INTRAMUSCULAR | Status: DC | PRN
  Administered 2020-03-18: 4 mg via INTRAVENOUS

## 2020-03-18 MED ORDER — DOCUSATE SODIUM 100 MG PO CAPS: 100.0000 mg | ORAL_CAPSULE | Freq: Two times a day (BID) | ORAL | Status: DC | PRN

## 2020-03-18 MED ORDER — FENTANYL CITRATE (PF) 100 MCG/2ML IJ SOLN
INTRAMUSCULAR | Status: AC
  Filled 2020-03-18: qty 2

## 2020-03-18 MED ORDER — ONDANSETRON HCL 4 MG/2ML IJ SOLN
4.0000 mg | Freq: Three times a day (TID) | INTRAMUSCULAR | Status: DC | PRN
  Administered 2020-03-18 – 2020-03-26 (×8): 4 mg via INTRAVENOUS
  Filled 2020-03-18 (×10): qty 2

## 2020-03-18 MED ORDER — PHENYLEPHRINE HCL-NACL 10-0.9 MG/250ML-% IV SOLN
INTRAVENOUS | Status: DC | PRN
Start: 2020-03-18 — End: 2020-03-18
  Administered 2020-03-18: 50 ug/min via INTRAVENOUS

## 2020-03-18 MED ORDER — FENTANYL CITRATE (PF) 100 MCG/2ML IJ SOLN
INTRAMUSCULAR | Status: DC | PRN
  Administered 2020-03-18: 50 ug via INTRAVENOUS

## 2020-03-18 MED ORDER — ALBUTEROL SULFATE HFA 108 (90 BASE) MCG/ACT IN AERS: 2.0000 | INHALATION_SPRAY | Freq: Four times a day (QID) | RESPIRATORY_TRACT | Status: DC | PRN

## 2020-03-18 MED ORDER — SODIUM CHLORIDE 0.9% FLUSH: 3.0000 mL | Freq: Once | INTRAVENOUS | Status: DC

## 2020-03-18 MED ORDER — SODIUM CHLORIDE 0.9 % IV BOLUS (SEPSIS)
600.0000 mL | Freq: Once | INTRAVENOUS | Status: AC
  Administered 2020-03-18: 600 mL via INTRAVENOUS

## 2020-03-18 MED ORDER — SODIUM CHLORIDE 0.9 % IR SOLN
Status: DC | PRN
  Administered 2020-03-18: 3000 mL via INTRAVESICAL

## 2020-03-18 MED ORDER — MIDAZOLAM HCL 5 MG/5ML IJ SOLN
INTRAMUSCULAR | Status: DC | PRN
  Administered 2020-03-18: 1 mg via INTRAVENOUS

## 2020-03-18 MED ORDER — SODIUM CHLORIDE 0.9 % IV SOLN
2.0000 g | INTRAVENOUS | Status: DC
  Administered 2020-03-18 – 2020-03-21 (×4): 2 g via INTRAVENOUS
  Filled 2020-03-18 (×2): qty 20
  Filled 2020-03-18 (×2): qty 2

## 2020-03-18 MED ORDER — OXYCODONE HCL 5 MG/5ML PO SOLN: 5.0000 mg | Freq: Once | ORAL | Status: DC | PRN

## 2020-03-18 MED ORDER — SODIUM CHLORIDE 0.9 % IV SOLN: INTRAVENOUS | Status: DC

## 2020-03-18 MED ORDER — INSULIN ASPART 100 UNIT/ML ~~LOC~~ SOLN
0.0000 [IU] | Freq: Three times a day (TID) | SUBCUTANEOUS | Status: DC
  Administered 2020-03-18: 7 [IU] via SUBCUTANEOUS
  Administered 2020-03-19: 4 [IU] via SUBCUTANEOUS
  Administered 2020-03-19: 7 [IU] via SUBCUTANEOUS
  Administered 2020-03-19 – 2020-03-20 (×3): 20 [IU] via SUBCUTANEOUS
  Administered 2020-03-20: 11 [IU] via SUBCUTANEOUS
  Administered 2020-03-21 (×2): 3 [IU] via SUBCUTANEOUS
  Administered 2020-03-22: 7 [IU] via SUBCUTANEOUS
  Administered 2020-03-22: 3 [IU] via SUBCUTANEOUS
  Administered 2020-03-23 – 2020-03-24 (×4): 4 [IU] via SUBCUTANEOUS
  Administered 2020-03-25 – 2020-03-26 (×4): 3 [IU] via SUBCUTANEOUS

## 2020-03-18 MED ORDER — PANTOPRAZOLE SODIUM 40 MG IV SOLR
40.0000 mg | Freq: Every day | INTRAVENOUS | Status: DC
  Administered 2020-03-18 – 2020-03-25 (×8): 40 mg via INTRAVENOUS
  Filled 2020-03-18 (×8): qty 40

## 2020-03-18 MED ORDER — SODIUM CHLORIDE 0.9 % IV SOLN
1.0000 g | Freq: Once | INTRAVENOUS | Status: AC
  Administered 2020-03-18: 1 g via INTRAVENOUS
  Filled 2020-03-18: qty 10

## 2020-03-18 MED ORDER — ONDANSETRON HCL 4 MG/2ML IJ SOLN
4.0000 mg | Freq: Once | INTRAMUSCULAR | Status: AC
  Administered 2020-03-18: 4 mg via INTRAVENOUS
  Filled 2020-03-18: qty 2

## 2020-03-18 MED ORDER — MOMETASONE FURO-FORMOTEROL FUM 200-5 MCG/ACT IN AERO
2.0000 | INHALATION_SPRAY | Freq: Two times a day (BID) | RESPIRATORY_TRACT | Status: DC
  Administered 2020-03-18 – 2020-03-19 (×2): 2 via RESPIRATORY_TRACT
  Filled 2020-03-18: qty 8.8

## 2020-03-18 MED ORDER — PROPOFOL 10 MG/ML IV BOLUS
INTRAVENOUS | Status: DC | PRN
  Administered 2020-03-18: 110 mg via INTRAVENOUS

## 2020-03-18 MED ORDER — POLYETHYLENE GLYCOL 3350 17 G PO PACK: 17.0000 g | PACK | Freq: Every day | ORAL | Status: DC | PRN

## 2020-03-18 MED ORDER — FLUTICASONE PROPIONATE 50 MCG/ACT NA SUSP
1.0000 | Freq: Every day | NASAL | Status: DC | PRN
  Filled 2020-03-18: qty 16

## 2020-03-18 MED ORDER — PROMETHAZINE HCL 25 MG/ML IJ SOLN: 6.2500 mg | INTRAMUSCULAR | Status: DC | PRN

## 2020-03-18 MED ORDER — PROPOFOL 10 MG/ML IV BOLUS
INTRAVENOUS | Status: AC
  Filled 2020-03-18: qty 20

## 2020-03-18 MED ORDER — ACETAMINOPHEN 325 MG PO TABS
650.0000 mg | ORAL_TABLET | Freq: Once | ORAL | Status: AC
  Administered 2020-03-18: 650 mg via ORAL
  Filled 2020-03-18: qty 2

## 2020-03-18 MED ORDER — OXYCODONE HCL 5 MG PO TABS: 5.0000 mg | ORAL_TABLET | Freq: Once | ORAL | Status: DC | PRN

## 2020-03-18 MED ORDER — INSULIN ASPART 100 UNIT/ML ~~LOC~~ SOLN
0.0000 [IU] | Freq: Every day | SUBCUTANEOUS | Status: DC
  Administered 2020-03-18: 2 [IU] via SUBCUTANEOUS
  Administered 2020-03-19: 5 [IU] via SUBCUTANEOUS

## 2020-03-18 SURGICAL SUPPLY — 23 items
BAG URO CATCHER STRL LF (MISCELLANEOUS) ×3 IMPLANT
BASKET ZERO TIP NITINOL 2.4FR (BASKET) IMPLANT
BSKT STON RTRVL ZERO TP 2.4FR (BASKET)
CATH FOLEY 2WAY SLVR  5CC 16FR (CATHETERS) ×3
CATH FOLEY 2WAY SLVR 5CC 16FR (CATHETERS) IMPLANT
CATH URET 5FR 28IN OPEN ENDED (CATHETERS) ×3 IMPLANT
CLOTH BEACON ORANGE TIMEOUT ST (SAFETY) ×3 IMPLANT
EXTRACTOR STONE NITINOL NGAGE (UROLOGICAL SUPPLIES) IMPLANT
GLOVE BIOGEL M STRL SZ7.5 (GLOVE) ×3 IMPLANT
GOWN STRL REUS W/TWL XL LVL3 (GOWN DISPOSABLE) ×3 IMPLANT
GUIDEWIRE ZIPWRE .038 STRAIGHT (WIRE) ×3 IMPLANT
KIT TURNOVER KIT A (KITS) IMPLANT
LASER FIB FLEXIVA PULSE ID 365 (Laser) IMPLANT
MANIFOLD NEPTUNE II (INSTRUMENTS) ×3 IMPLANT
PACK CYSTO (CUSTOM PROCEDURE TRAY) ×3 IMPLANT
SHEATH URETERAL 12FRX35CM (MISCELLANEOUS) IMPLANT
STENT URET 6FRX24 CONTOUR (STENTS) ×2 IMPLANT
STENT URET 6FRX26 CONTOUR (STENTS) IMPLANT
TRACTIP FLEXIVA PULS ID 200XHI (Laser) IMPLANT
TRACTIP FLEXIVA PULSE ID 200 (Laser)
TUBING CONNECTING 10 (TUBING) ×2 IMPLANT
TUBING CONNECTING 10' (TUBING) ×1
TUBING UROLOGY SET (TUBING) ×3 IMPLANT

## 2020-03-18 NOTE — ED Provider Notes (Addendum)
[facility] DEPT Provider Note   CSN: [id number] Arrival date & time: 03/18/20  0734     History Chief Complaint  Patient presents with  . Abdominal Pain  . Fever  . Dysuria    Gina [name] is a 54 y.o. female with PMHx HTN, HLD, IBS, GERD, Fibromyalgia, Asthma, and Diabetes who presents to the ED today with complaint of gradual onset, constant, sharp, left flank pain radiating into LLQ x 3 days. Pt also complains of fevers with tmax 104.0, nausea, NBNB emesis, and foul smelling urine. Pt states she feels like she is not voiding all the way as well. She reports hx of kidney stones in the past and states this feels similar - she follows with a urologist however cannot recall the name. Pt has been taking Tylenol and Advil for her pain/fevers. She last took Tylenol around 1 AM this morning and took Advil around 5 AM this morning.   Pt also mentions a "sore" on the right side of her abdomen that appeared 1 week ago. She denies it growing larger in size. She has been covering it with a bandaid and applying silvadene cream to it. Small amount of purulent drainage to the area.   Pt denies chills, cough, chest pain, sore throat, diarrhea, constipation, pelvic pain, or any other associated symptoms.   The history is provided by the patient and medical records.       Past Medical History:  Diagnosis Date  . Anemia   . Anxiety   . Arthritis   . Asthma   . Back pain   . Chronic pain   . Diabetes mellitus    type 2  . Family history of adverse reaction to anesthesia    youngest daughter hard to wake up both daughters post op n/v  . Fibromyalgia   . GERD (gastroesophageal reflux disease)   . Headache    migraines  . History of kidney stones    2 big stones in left kidney now  . History of recurrent UTIs    last uti 3 weeks ago  . Hyperlipidemia   . Hypertension   . IBS (irritable bowel syndrome)   . Obesities, morbid ([place])   . Pneumonia 06/2016   . PONV (postoperative nausea and vomiting) 2004   came back after hystectomy bowels locked up on life support for 1 month   . Sinusitis   . 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 Umbilical hernia   . Wound discharge    open wound right leg going to wound center changes dressing qday with santyl cream quarter size wound    Patient Active Problem List   Diagnosis Date Noted  . Anemia 12/18/2019  . Iron deficiency anemia 12/18/2019  . Peripheral neuropathy 10/25/2017  . Chronic pain disorder 06/25/2017  . Intertrigo 06/25/2017  . Chronic back pain 03/19/2017  . GERD (gastroesophageal reflux disease) 03/19/2017  . Morbid obesity with BMI of 50.0-59.9, adult ([place]) 03/19/2017  . Bacteremia due to group B Streptococcus 02/11/2017  . Volume overload 02/11/2017  . Acute encephalopathy 02/11/2017  . Right renal mass 02/11/2017  . Normocytic anemia 02/11/2017  . Generalized abdominal pain   . Constipation   . Benign neoplasm of rectum   . Nausea and vomiting   . Shortness of breath 06/25/2016  . CAP (community acquired pneumonia) 06/25/2016  . History of recurrent UTIs 06/25/2016  . Hyperlipidemia 06/25/2016  . Type  2 diabetes mellitus with diabetic neuropathy, unspecified ([place]) 06/25/2016  . Hypertension 06/25/2016  . Asthma 06/25/2016  . Polypharmacy 06/25/2016  . Eosinophilia 04/23/2014    Past Surgical History:  Procedure Laterality Date  . ABDOMINAL HYSTERECTOMY     1 ovary left  . CARPAL TUNNEL RELEASE Right   . CESAREAN SECTION     x 2  . CHOLECYSTECTOMY    . COLONOSCOPY WITH PROPOFOL N/A 12/03/2016   Procedure: COLONOSCOPY WITH PROPOFOL;  Surgeon: Doran Stabler, MD;  Location: [facility];  Service: Gastroenterology;  Laterality: N/A;  . CYSTECTOMY     left foot  . ESOPHAGOGASTRODUODENOSCOPY (EGD) WITH PROPOFOL N/A 12/03/2016   Procedure: ESOPHAGOGASTRODUODENOSCOPY (EGD) WITH PROPOFOL;  Surgeon: Doran Stabler, MD;  Location: [facility];   Service: Gastroenterology;  Laterality: N/A;  . LITHOTRIPSY    . MULTIPLE TOOTH EXTRACTIONS       OB History   No obstetric history on file.     Family History  Problem Relation Age of Onset  . Cancer Mother   . Cirrhosis Mother   . Heart failure Father   . Diabetes Father   . Hypertension Father   . Colon cancer Neg Hx     Social History   Tobacco Use  . Smoking status: Current Every Day Smoker    Packs/day: 0.50    Years: 33.00    Pack years: 16.50    Types: Cigarettes  . Smokeless tobacco: Never Used  Vaping Use  . Vaping Use: Never used  Substance Use Topics  . Alcohol use: No  . Drug use: No    Home Medications Prior to Admission medications   Medication Sig Start Date End Date Taking? Authorizing Provider  acetaminophen (TYLENOL) 325 MG tablet Take 650 mg by mouth every 6 (six) hours as needed for mild pain or headache.   Yes [provider]  ALPRAZolam Duanne Moron) 1 MG tablet Take 1 mg by mouth 2 (two) times daily as needed for anxiety.    Yes [provider]  Aspirin-Salicylamide-Caffeine (BC HEADACHE POWDER PO) Take 1 packet by mouth as needed (headache/pain).   Yes [provider]  atorvastatin (LIPITOR) 40 MG tablet Take 40 mg by mouth daily. 01/09/20  Yes [provider]  fluticasone (FLONASE) 50 MCG/ACT nasal spray Place 1 spray into both nostrils daily as needed for allergies.  06/18/16  Yes [provider]  Fluticasone-Salmeterol (WIXELA INHUB) 250-50 MCG/DOSE AEPB Inhale 1 puff into the lungs 2 (two) times daily. 12/22/18  Yes [place], Betty G, MD  ibuprofen (ADVIL) 200 MG tablet Take 400 mg by mouth every 6 (six) hours as needed for headache or mild pain.   Yes [provider]  lisinopril (ZESTRIL) 10 MG tablet Take 10 mg by mouth daily. 10/12/19  Yes [provider]  metFORMIN (GLUCOPHAGE) 850 MG tablet TAKE 1 TABLET (850 MG TOTAL) BY MOUTH 2 (TWO) TIMES DAILY WITH A MEAL. Patient taking differently:  Take 425 mg by mouth daily. 1/2 tablet daily 03/24/18  Yes [place], Betty G, MD  nystatin (NYSTATIN) powder Apply topically 3 (three) times daily as needed. Patient taking differently: Apply 1 application topically 3 (three) times daily as needed (chaffing).  01/17/18  Yes [place], Betty G, MD  omeprazole (PRILOSEC) 20 MG capsule TAKE 1 CAPSULE (20 MG TOTAL) BY MOUTH DAILY BEFORE BREAKFAST. 07/03/19  Yes [place], Betty G, MD  OZEMPIC, 0.25 OR 0.5 MG/DOSE, 2 MG/1.5ML SOPN Inject 2 mg into the skin once a  week. 03/05/20  Yes [provider]  polyvinyl alcohol (LIQUIFILM TEARS) 1.4 % ophthalmic solution Place 1 drop into both eyes as needed for dry eyes.   Yes [provider]  pregabalin (LYRICA) 200 MG capsule TAKE 1 CAPSULE BY MOUTH THREE TIMES A DAY 01/17/18  Yes [place], Betty G, MD  VENTOLIN HFA 108 (90 Base) MCG/ACT inhaler INHALE 2 PUFFS EVERY 6 (SIX) HOURS AS NEEDED INTO THE LUNGS FOR WHEEZING OR SHORTNESS OF BREATH. Patient taking differently: Inhale 2 puffs into the lungs every 6 (six) hours as needed for wheezing or shortness of breath.  06/02/18  Yes [place], Betty G, MD  atorvastatin (LIPITOR) 20 MG tablet TAKE 1 TABLET (20 MG TOTAL) DAILY BY MOUTH. Patient not taking: Reported on 03/18/2020 03/18/18   [place], Betty G, MD  glucose blood (ONE TOUCH ULTRA TEST) test strip USE TO TEST BLOOD SUGAR 3 TIMES DAILY 06/16/18   [place], Betty G, MD  NOVOLOG MIX 70/30 FLEXPEN (70-30) 100 UNIT/ML FlexPen INJECT 12 UNITS TWO TIMES DAILY Patient not taking: Reported on 03/05/2020 01/11/19   [place], Betty G, MD    Allergies    Darvocet [propoxyphene n-acetaminophen], Morphine and related, Penicillins, Tape, and Other  Review of Systems   Review of Systems  Constitutional: Positive for fever. Negative for chills.  Respiratory: Negative for cough.   Cardiovascular: Negative for chest pain.  Gastrointestinal: Positive for abdominal pain, nausea and vomiting. Negative for blood in stool, constipation  and diarrhea.  Genitourinary: Positive for difficulty urinating and flank pain.  Skin: Positive for wound (skin breakdown noted to RLQ).  All other systems reviewed and are negative.   Physical Exam Updated Vital Signs BP (!) 124/50 (BP Location: Left Arm)   Pulse (!) 114   Temp (!) 101.1 F (38.4 C) (Oral)   Ht 5\' 3"  (1.6 m)   Wt 136.1 kg   SpO2 90% Comment: Patient placed on O2 2L/min via [place] and sats increased to 93%  BMI 53.14 kg/m   Physical Exam Vitals and nursing note reviewed.  Constitutional:      Appearance: She is obese. She is ill-appearing. She is not diaphoretic.  HENT:     Head: Normocephalic and atraumatic.  Eyes:     Conjunctiva/sclera: Conjunctivae normal.  Cardiovascular:     Rate and Rhythm: Normal rate and regular rhythm.     Heart sounds: Normal heart sounds.  Pulmonary:     Effort: Pulmonary effort is normal.     Breath sounds: Normal breath sounds. No wheezing, rhonchi or rales.     Comments: Currently on 2L , satting 94%. Able to speak in full sentences without difficulty. LCTAB.  Abdominal:     Palpations: Abdomen is soft.     Tenderness: There is abdominal tenderness in the left lower quadrant. There is left CVA tenderness. There is no right CVA tenderness, guarding or rebound.  Musculoskeletal:     Cervical back: Neck supple.  Skin:    General: Skin is warm and dry.  Neurological:     Mental Status: She is alert.     ED Results / Procedures / Treatments   Labs (all labs ordered are listed, but only abnormal results are displayed) Labs Reviewed  LACTIC ACID, PLASMA - Abnormal; Notable for the following components:      Result Value   Lactic Acid, Venous 2.2 (*)    All other components within normal limits  COMPREHENSIVE METABOLIC PANEL - Abnormal; Notable for the following components:   Sodium  129 (*)    CO2 19 (*)    Glucose, Bld 191 (*)    BUN 27 (*)    Creatinine, Ser 1.87 (*)    Calcium 8.3 (*)    GFR calc non Af Amer 30 (*)     GFR calc Af Amer 34 (*)    All other components within normal limits  CBC WITH DIFFERENTIAL/PLATELET - Abnormal; Notable for the following components:   WBC 13.8 (*)    MCH 25.5 (*)    RDW 20.4 (*)    Neutro Abs 13.0 (*)    Lymphs Abs 0.2 (*)    Abs Immature Granulocytes 0.09 (*)    All other components within normal limits  URINALYSIS, ROUTINE W REFLEX MICROSCOPIC - Abnormal; Notable for the following components:   APPearance TURBID (*)    Hgb urine dipstick LARGE (*)    Protein, ur >=300 (*)    Nitrite POSITIVE (*)    Leukocytes,Ua LARGE (*)    RBC / HPF >50 (*)    WBC, UA >50 (*)    Bacteria, UA MANY (*)    All other components within normal limits  GLUCOSE, CAPILLARY - Abnormal; Notable for the following components:   Glucose-Capillary 174 (*)    All other components within normal limits  CBG MONITORING, ED - Abnormal; Notable for the following components:   Glucose-Capillary 175 (*)    All other components within normal limits  SARS CORONAVIRUS 2 BY RT PCR (HOSPITAL ORDER, [facility])  CULTURE, BLOOD (ROUTINE X 2)  CULTURE, BLOOD (ROUTINE X 2)  URINE CULTURE  LACTIC ACID, PLASMA  I-STAT BETA HCG BLOOD, ED ([facility], [facility], [facility] ONLY)    EKG Normal sinus rhythm Low voltage QRS Poor anterior R wave progression No significant change since last tracing Confirmed by Minus Breeding [phone]) on 03/20/2020 9:19:56 PM  Radiology DG Chest 2 View  Result Date: 03/18/2020 CLINICAL DATA:  Left-sided pain, initial encounter EXAM: CHEST - 2 VIEW COMPARISON:  10/20/2017 FINDINGS: Cardiac shadow is enlarged but accentuated by the frontal technique. The lungs are well aerated bilaterally. Mild central vascular congestion is seen. No focal infiltrate or effusion is noted. Degenerative changes of the thoracic spine are seen. IMPRESSION: Central vascular congestion without other acute abnormality. Electronically Signed   By: Inez Catalina M.D.   On: 03/18/2020 09:01   CT  Renal Stone Study  Result Date: 03/18/2020 CLINICAL DATA:  Left lower abdominal pain that radiates into the left flank. Fever. EXAM: CT ABDOMEN AND PELVIS WITHOUT CONTRAST TECHNIQUE: Multidetector CT imaging of the abdomen and pelvis was performed following the standard protocol without IV contrast. COMPARISON:  01/26/2020 FINDINGS: Lower chest: Unremarkable. Hepatobiliary: The liver shows diffusely decreased attenuation suggesting fat deposition. 25.8 cm craniocaudal length of the liver is compatible with hepatomegaly. No focal abnormality in the liver on this study without intravenous contrast. Gallbladder is surgically absent. No intrahepatic or extrahepatic biliary dilation. Pancreas: No focal mass lesion. No dilatation of the main duct. No intraparenchymal cyst. No peripancreatic edema. Spleen: No splenomegaly. No focal mass lesion. Adrenals/Urinary Tract: No adrenal nodule or mass. Cortical scarring again noted upper pole right kidney with stable exophytic 1.6 x 1.2 cm lesion stable since prior study and also when comparing back to an exam from 11/20/2010. Cluster of stones and/or staghorn calculus in the lower pole left kidney is similar. There is mild left hydroureteronephrosis on today's exam secondary to a 3 x 3 x 5 mm stone in  the distal left ureter immediately proximal to the left UVJ (axial 76/2). Bladder is decompressed. Stomach/Bowel: Stomach is unremarkable. No gastric wall thickening. No evidence of outlet obstruction. Duodenum is normally positioned as is the ligament of Treitz. No small bowel wall thickening. No small bowel dilatation. The terminal ileum is normal. The appendix is normal. No gross colonic mass. No colonic wall thickening. Vascular/Lymphatic: There is abdominal aortic atherosclerosis without aneurysm. There is no gastrohepatic or hepatoduodenal ligament lymphadenopathy. No retroperitoneal or mesenteric lymphadenopathy. No pelvic sidewall lymphadenopathy. Reproductive: The uterus  is surgically absent. There is no adnexal mass. Other: No intraperitoneal free fluid. Musculoskeletal: Marked laxity of the ventral fascia noted, with prominent pannus. Similar appearance of umbilical hernia containing only fat. No worrisome lytic or sclerotic osseous abnormality. IMPRESSION: 1. 3 x 3 x 5 mm distal left ureteral stone with mild left hydroureteronephrosis. 2. Cluster of stones and/or staghorn calculus in the lower pole left kidney, similar to prior. 3. Hepatomegaly with hepatic steatosis. 4. Stable exophytic lesion upper pole right kidney since 2012, likely a cyst complicated by proteinaceous debris or hemorrhage. 5. Aortic Atherosclerosis (ICD10-I70.0). Electronically Signed   By: Misty Stanley M.D.   On: 03/18/2020 10:29    Procedures .Critical Care Performed by: Eustaquio Maize, PA-C Authorized by: Eustaquio Maize, PA-C   Critical care provider statement:    Critical care time (minutes):  50   Critical care was necessary to treat or prevent imminent or life-threatening deterioration of the following conditions:  Sepsis   Critical care was time spent personally by me on the following activities:  Discussions with consultants, evaluation of patient's response to treatment, examination of patient, ordering and performing treatments and interventions, ordering and review of laboratory studies, ordering and review of radiographic studies, pulse oximetry, re-evaluation of patient's condition, obtaining history from patient or surrogate and review of old charts   (including critical care time)  Medications Ordered in ED Medications  sodium chloride flush (NS) 0.9 % injection 3 mL ( Intravenous MAR Hold 03/18/20 1138)  0.9 %  sodium chloride infusion ( Intravenous Continued from Pre-op 03/18/20 1216)  cefTRIAXone (ROCEPHIN) 2 g in sodium chloride 0.9 % 100 mL IVPB (has no administration in time range)  sodium chloride irrigation 0.9 % (3,000 mLs Bladder Irrigation Given 03/18/20 1210)   iohexol (OMNIPAQUE) 300 MG/ML solution (10 mLs  Given 03/18/20 1254)  sodium chloride 0.9 % bolus 1,000 mL (0 mLs Intravenous Stopped 03/18/20 1002)  acetaminophen (TYLENOL) tablet 650 mg (650 mg Oral Given 03/18/20 0900)  ondansetron (ZOFRAN) injection 4 mg (4 mg Intravenous Given 03/18/20 0901)  HYDROmorphone (DILAUDID) injection 1 mg (1 mg Intravenous Given 03/18/20 0900)  sodium chloride 0.9 % bolus 600 mL (0 mLs Intravenous Stopped 03/18/20 1002)  cefTRIAXone (ROCEPHIN) 1 g in sodium chloride 0.9 % 100 mL IVPB (0 g Intravenous Stopped 03/18/20 1045)  ketorolac (TORADOL) 30 MG/ML injection 15 mg (15 mg Intravenous Given 03/18/20 1044)  sodium chloride 0.9 % bolus 1,000 mL (1,000 mLs Intravenous New Bag/Given 03/18/20 1050)    ED Course  I have reviewed the triage vital signs and the nursing notes.  Pertinent labs & imaging results that were available during my care of the patient were reviewed by me and considered in my medical decision making (see chart for details).  Clinical Course as of Mar 18 1256  Mon Mar 18, 2020  0847 WBC(!): 13.8 [MV]  0847 Creatinine(!): 1.87 [MV]  0912 Lactic Acid, Venous(!!): 2.2 [MV]    Clinical Course  User Index [MV] Eustaquio Maize, PA-C   MDM Rules/Calculators/A&P                          54 year old female who presents to the ED today with complaint of left flank/left lower quadrant pain for the past 3 days with associated fevers, nausea, vomiting, foul-smelling urine.  Patient with history of kidney stones and states this feels similar.  On arrival to the ED vitals with noted fever 101.1, tachycardia 114.  She is found to be hypoxic with an O2 sat of 90%, placed on 2 L nasal cannula with increased to 94%.  Blood pressure on arrival 124/50, while in the room systolic 253.  Only meets sepsis criteria with suspected source of infection urine.  Evolving sepsis order set initiated; placed empirically on Rocephin to cover for UTI.  Patient does have history of penicillin  however it does appear she can tolerate cephalosporins.  Will provide 1 L fluid bolus ending first lactic acid.  Pain medication, Zofran for symptomatic relief.  Patient did last take Tylenol around 1 AM this morning, will provide additional dose of Tylenol given she is febrile here.  X-ray has been ordered given slight hypoxia however patient has no respiratory complaints at this time.  We will continue to monitor.  CBC with leukocytosis of 13.8 with left shift.  Lactic acid elevated at 2.2.  CMP with creatinine 1.87. Glucose 191, bicarb 19, no gap.   Lab Results  Component Value Date   CREATININE 1.87 (H) 03/18/2020   CREATININE 0.86 01/17/2018   CREATININE 1.30 (H) 10/21/2017   CXR with vascular congestion; no other findings at this time.  U/A positive for infection with large leuks, positive nitrites, > 50 RBCs, > 50 WBCs with rare bacteria and WBC clumps.  CT scan with 3 x 3 x 5 mm left ureteral stone with hydronephrosis - will consult urology at this time.   On reevaluation pt's blood pressure steadily decreasing; she has already received 1.6 L fluid bolus based on IBW; will provide additional fluid bolus at this time. If no improvement may need to start pressors. Have discussed case with attending physician Dr. Tomi Bamberger who agrees with plan. Will plan to discuss with critical care.   Discussed case with attending physician Dr. Lovena Neighbours; recommends foley cath placement at this time - unsure when pt can go to the OR given she recently ate a taco prior to arrival however will plan for later today if possible.   Pt was taken to the OR prior to discussing with critical care.  Discussed case with Marni Griffon, NP with [facility]; will evaluate patient for admission.   This note was prepared using Dragon voice recognition software and may include unintentional dictation errors due to the inherent limitations of voice recognition software.  Final Clinical Impression(s) / ED Diagnoses Final diagnoses:   Sepsis with acute renal failure, due to unspecified organism, unspecified acute renal failure type, unspecified whether septic shock present [facility])  Ureteral stone with hydronephrosis    Rx / DC Orders ED Discharge Orders    None       Eustaquio Maize, PA-C 03/18/20 1257    Dorie Rank, MD 03/18/20 [place], [place], PA-C 04/02/20 6644    Dorie Rank, MD 04/02/20 1255

## 2020-03-18 NOTE — ED Provider Notes (Signed)
Medical screening examination/treatment/procedure(s) were conducted as a shared visit with non-physician practitioner(s) and myself.  I personally evaluated the patient during the encounter.   Pt presents with fever, uti, sepsis.  Obstructed kidney stone.  Remains with low blood pressures.  Ordered additional IV fluids.  May need pressors if no response.  PCCM and urology consulted.   Dorie Rank, MD 03/18/20 1133

## 2020-03-18 NOTE — Sepsis Progress Note (Signed)
Went to OR for Cystoscopy with left JJ stent placement. Have asked bedside nurse to repeat LA to show interventions are working

## 2020-03-18 NOTE — ED Notes (Signed)
Date and time results received: 03/18/20 0852 (use smartphrase ".now" to insert current time)  Test: lactic Acid Critical Value: 2.2  Name of Provider Notified:  Orders Received? Or Actions Taken?: no new orders

## 2020-03-18 NOTE — Op Note (Signed)
Operative Note  Preoperative diagnosis:  1.  Septic shock due to an obstructing 5 mm left distal ureteral calculus  Postoperative diagnosis: Same  Procedure(s): 1.  Cystoscopy with left JJ stent placement Two.  Left retrograde pyelogram with intraoperative interpretation of fluoroscopic imaging  Surgeon: Ellison Hughs, MD  Assistants:  None  Anesthesia:  General  Complications:  None  EBL: Less than 5 mL  Specimens: 1.  None  Drains/Catheters: 1.  Left six French, 24 cm JJ stent without tether Two.  14 French Foley catheter with 10 mL of sterile water in the balloon  Intraoperative findings:   1. Grossly purulent urine was seen draining from the left ureteral orifice following wire manipulation and stent placement 2. Left retrograde pyelogram revealed a small amount of dilation of the distal aspects of the left ureter.  There was a filling defect in the distal left ureter consistent with the obstructing stone seen on CT.  The proximal ureter and left renal pelvis was uniformly dilated with no other filling defects.  Indication:  Gina [name] is a 54 y.o. female with septic shock secondary to a 5 mm left distal ureteral calculus.  She has been consented for the above procedures, voices understanding wishes to proceed.  Description of procedure:  After informed consent was obtained, the patient was brought to the operating room and general endotracheal anesthesia was administered. The patient was then placed in the dorsolithotomy position and prepped and draped in the usual sterile fashion. A timeout was performed. A 23 French rigid cystoscope was then inserted into the urethral meatus and advanced into the bladder under direct vision. A complete bladder survey revealed no intravesical pathology.  A 5 French ureteral catheter was then inserted into the left ureteral orifice and a retrograde pyelogram was obtained, with the findings listed above.  A Glidewire was then  used to intubate the lumen of the ureteral catheter and was advanced up to the left renal pelvis, under fluoroscopic guidance.  The catheter was then removed, leaving the wire in place.  A six [place], 24 cm JJ stent was then advanced over the wire and into good position within the left collecting system, confirming placement via fluoroscopy.  There was a large amount of purulent urine draining around the stent following placement.  A 14 French Foley catheter was placed and set to gravity drainage.  She tolerated the procedure well and was transferred to the postanesthesia in stable condition.  Plan: Monitor in the ICU.  Continue broad-spectrum antibiotic coverage while urine and blood cultures are pending.  Remove Foley catheter in 48 hours.  She will need definitive treatment of stone treatment in 2-3 weeks as an outpatient.

## 2020-03-18 NOTE — Transfer of Care (Signed)
Immediate Anesthesia Transfer of Care Note  Patient: Gina [name]  Procedure(s) Performed: CYSTOSCOPY/LEFT RETROGRADE PYELOGRAM, STENT PLACEMENT (Left )  Patient Location: PACU  Anesthesia Type:General  Level of Consciousness: drowsy and patient cooperative  Airway & Oxygen Therapy: Patient Spontanous Breathing and Patient connected to face mask oxygen  Post-op Assessment: Report given to RN and Post -op Vital signs reviewed and stable  Post vital signs: Reviewed and stable  Last Vitals:  Vitals Value Taken Time  BP    Temp 36.9 C 03/18/20 1318  Pulse 86 03/18/20 1325  Resp 14 03/18/20 1325  SpO2 96 % 03/18/20 1325  Vitals shown include unvalidated device data.  Last Pain:  Vitals:   03/18/20 1159  TempSrc: Oral  PainSc:       Patients Stated Pain Goal: 4 (28/40/69 8614)  Complications: No complications documented.

## 2020-03-18 NOTE — H&P (View-Only) (Signed)
Urology Consult   Physician requesting consult: Marye Round, MD  Reason for consult: Septic shock due to obstructing left UVJ stone  History of Present Illness: Gina [name] is a 54 y.o.-year-old female with a history of kidney stones.  She reports a 3-day history of worsening sharp, constant, nonradiating left flank pain associated with nausea and vomiting as well as subjective fevers of 101 F at home.  CT imaging from this morning reveals an obstructing 5 mm left UVJ calculus with associated hydronephrosis.  She was found to be hypotensive and febrile in the emergency room.   Past Medical History:  Diagnosis Date  . Anemia   . Anxiety   . Arthritis   . Asthma   . Back pain   . Chronic pain   . Diabetes mellitus    type 2  . Family history of adverse reaction to anesthesia    youngest daughter hard to wake up both daughters post op n/v  . Fibromyalgia   . GERD (gastroesophageal reflux disease)   . Headache    migraines  . History of kidney stones    2 big stones in left kidney now  . History of recurrent UTIs    last uti 3 weeks ago  . Hyperlipidemia   . Hypertension   . IBS (irritable bowel syndrome)   . Obesities, morbid ([place])   . Pneumonia 06/2016  . PONV (postoperative nausea and vomiting) 2004   came back after hystectomy bowels locked up on life support for 1 month   . Sinusitis   . 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 Umbilical hernia   . Wound discharge    open wound right leg going to wound center changes dressing qday with santyl cream quarter size wound    Past Surgical History:  Procedure Laterality Date  . ABDOMINAL HYSTERECTOMY     1 ovary left  . CARPAL TUNNEL RELEASE Right   . CESAREAN SECTION     x 2  . CHOLECYSTECTOMY    . COLONOSCOPY WITH PROPOFOL N/A 12/03/2016   Procedure: COLONOSCOPY WITH PROPOFOL;  Surgeon: Doran Stabler, MD;  Location: [facility];  Service: Gastroenterology;  Laterality: N/A;  . CYSTECTOMY      left foot  . ESOPHAGOGASTRODUODENOSCOPY (EGD) WITH PROPOFOL N/A 12/03/2016   Procedure: ESOPHAGOGASTRODUODENOSCOPY (EGD) WITH PROPOFOL;  Surgeon: Doran Stabler, MD;  Location: [facility];  Service: Gastroenterology;  Laterality: N/A;  . LITHOTRIPSY    . MULTIPLE TOOTH EXTRACTIONS      Current Hospital Medications:  Home Meds:  Current Meds  Medication Sig  . acetaminophen (TYLENOL) 325 MG tablet Take 650 mg by mouth every 6 (six) hours as needed for mild pain or headache.  . ALPRAZolam (XANAX) 1 MG tablet Take 1 mg by mouth 2 (two) times daily as needed for anxiety.   . Aspirin-Salicylamide-Caffeine (BC HEADACHE POWDER PO) Take 1 packet by mouth as needed (headache/pain).  Marland Kitchen atorvastatin (LIPITOR) 40 MG tablet Take 40 mg by mouth daily.  . fluticasone (FLONASE) 50 MCG/ACT nasal spray Place 1 spray into both nostrils daily as needed for allergies.   . Fluticasone-Salmeterol (WIXELA INHUB) 250-50 MCG/DOSE AEPB Inhale 1 puff into the lungs 2 (two) times daily.  Marland Kitchen ibuprofen (ADVIL) 200 MG tablet Take 400 mg by mouth every 6 (six) hours as needed for headache or mild pain.  Marland Kitchen lisinopril (ZESTRIL) 10 MG tablet Take 10 mg by mouth daily.  Marland Kitchen  metFORMIN (GLUCOPHAGE) 850 MG tablet TAKE 1 TABLET (850 MG TOTAL) BY MOUTH 2 (TWO) TIMES DAILY WITH A MEAL. (Patient taking differently: Take 425 mg by mouth daily. 1/2 tablet daily)  . nystatin (NYSTATIN) powder Apply topically 3 (three) times daily as needed. (Patient taking differently: Apply 1 application topically 3 (three) times daily as needed (chaffing). )  . omeprazole (PRILOSEC) 20 MG capsule TAKE 1 CAPSULE (20 MG TOTAL) BY MOUTH DAILY BEFORE BREAKFAST.  Marland Kitchen OZEMPIC, 0.25 OR 0.5 MG/DOSE, 2 MG/1.5ML SOPN Inject 2 mg into the skin once a week.  . polyvinyl alcohol (LIQUIFILM TEARS) 1.4 % ophthalmic solution Place 1 drop into both eyes as needed for dry eyes.  . pregabalin (LYRICA) 200 MG capsule TAKE 1 CAPSULE BY MOUTH THREE TIMES A DAY  .  VENTOLIN HFA 108 (90 Base) MCG/ACT inhaler INHALE 2 PUFFS EVERY 6 (SIX) HOURS AS NEEDED INTO THE LUNGS FOR WHEEZING OR SHORTNESS OF BREATH. (Patient taking differently: Inhale 2 puffs into the lungs every 6 (six) hours as needed for wheezing or shortness of breath. )    Scheduled Meds: . sodium chloride flush  3 mL Intravenous Once   Continuous Infusions: . sodium chloride 150 mL/hr at 03/18/20 1045  . cefTRIAXone (ROCEPHIN)  IV Stopped (03/18/20 1002)   PRN Meds:.  Allergies:  Allergies  Allergen Reactions  . Darvocet [Propoxyphene N-Acetaminophen] Nausea And Vomiting  . Morphine And Related Itching  . Penicillins Hives and Swelling    Tolerates Rocephin  Swelling all over body  Has patient had a PCN reaction causing immediate rash, facial/tongue/throat swelling, SOB or lightheadedness with hypotension: Yes Has patient had a PCN reaction causing severe rash involving mucus membranes or skin necrosis: Yes Has patient had a PCN reaction that required hospitalization Unknown Has patient had a PCN reaction occurring within the last 10 years: No  If all of the above answers are "NO", then may proceed with Cephalosporin use.   . Tape Hives    Adhesive tape    Family History  Problem Relation Age of Onset  . Cancer Mother   . Cirrhosis Mother   . Heart failure Father   . Diabetes Father   . Hypertension Father   . Colon cancer Neg Hx     Social History:  reports that she has been smoking cigarettes. She has a 16.50 pack-year smoking history. She has never used smokeless tobacco. She reports that she does not drink alcohol and does not use drugs.  ROS: A complete review of systems was performed.  All systems are negative except for pertinent findings as noted.  Physical Exam:  Vital signs in last 24 hours: Temp:  [98.1 F (36.7 C)-101.1 F (38.4 C)] 98.1 F (36.7 C) (08/09 1055) Pulse Rate:  [84-114] 85 (08/09 1115) Resp:  [13-20] 15 (08/09 1115) BP: (72-124)/(38-50)  87/40 (08/09 1115) SpO2:  [90 %-94 %] 90 % (08/09 1115) Weight:  [136.1 kg] 136.1 kg (08/09 0757) Constitutional:  Alert and oriented, restless and appears uncomfortable Cardiovascular: Regular rate and rhythm, No JVD Respiratory: Normal respiratory effort, Lungs clear bilaterally GI: morbid obesity, nontender, nondistended, no abdominal masses GU: Left CVA tenderness Lymphatic: No lymphadenopathy Neurologic: Grossly intact, no focal deficits Psychiatric: Normal mood and affect  Laboratory Data:  Recent Labs    03/18/20 0801  WBC 13.8*  HGB 12.1  HCT 40.1  PLT 167    Recent Labs    03/18/20 0801  NA 129*  K 3.8  CL 98  GLUCOSE 191*  BUN 27*  CALCIUM 8.3*  CREATININE 1.87*     Results for orders placed or performed during the hospital encounter of 03/18/20 (from the past 24 hour(s))  Lactic acid, plasma     Status: Abnormal   Collection Time: 03/18/20  8:01 AM  Result Value Ref Range   Lactic Acid, Venous 2.2 (HH) 0.5 - 1.9 mmol/L  Comprehensive metabolic panel     Status: Abnormal   Collection Time: 03/18/20  8:01 AM  Result Value Ref Range   Sodium 129 (L) 135 - 145 mmol/L   Potassium 3.8 3.5 - 5.1 mmol/L   Chloride 98 98 - 111 mmol/L   CO2 19 (L) 22 - 32 mmol/L   Glucose, Bld 191 (H) 70 - 99 mg/dL   BUN 27 (H) 6 - 20 mg/dL   Creatinine, Ser 1.87 (H) 0.44 - 1.00 mg/dL   Calcium 8.3 (L) 8.9 - 10.3 mg/dL   Total Protein 7.0 6.5 - 8.1 g/dL   Albumin 3.5 3.5 - 5.0 g/dL   AST 21 15 - 41 U/L   ALT 19 0 - 44 U/L   Alkaline Phosphatase 76 38 - 126 U/L   Total Bilirubin 0.8 0.3 - 1.2 mg/dL   GFR calc non Af Amer 30 (L) >60 mL/min   GFR calc Af Amer 34 (L) >60 mL/min   Anion gap 12 5 - 15  CBC with Differential     Status: Abnormal   Collection Time: 03/18/20  8:01 AM  Result Value Ref Range   WBC 13.8 (H) 4.0 - 10.5 K/uL   RBC 4.74 3.87 - 5.11 MIL/uL   Hemoglobin 12.1 12.0 - 15.0 g/dL   HCT 40.1 36 - 46 %   MCV 84.6 80.0 - 100.0 fL   MCH 25.5 (L) 26.0 -  34.0 pg   MCHC 30.2 30.0 - 36.0 g/dL   RDW 20.4 (H) 11.5 - 15.5 %   Platelets 167 150 - 400 K/uL   nRBC 0.0 0.0 - 0.2 %   Neutrophils Relative % 94 %   Neutro Abs 13.0 (H) 1.7 - 7.7 K/uL   Lymphocytes Relative 2 %   Lymphs Abs 0.2 (L) 0.7 - 4.0 K/uL   Monocytes Relative 3 %   Monocytes Absolute 0.4 0 - 1 K/uL   Eosinophils Relative 0 %   Eosinophils Absolute 0.0 0 - 0 K/uL   Basophils Relative 0 %   Basophils Absolute 0.0 0 - 0 K/uL   Immature Granulocytes 1 %   Abs Immature Granulocytes 0.09 (H) 0.00 - 0.07 K/uL  I-Stat beta hCG blood, ED     Status: None   Collection Time: 03/18/20  8:23 AM  Result Value Ref Range   I-stat hCG, quantitative <5.0 <5 mIU/mL   Comment 3          SARS Coronavirus 2 by RT PCR (hospital order, performed in [place] hospital lab) Nasopharyngeal Nasopharyngeal Swab     Status: None   Collection Time: 03/18/20  9:02 AM   Specimen: Nasopharyngeal Swab  Result Value Ref Range   SARS Coronavirus 2 NEGATIVE NEGATIVE  POC CBG, ED     Status: Abnormal   Collection Time: 03/18/20  9:13 AM  Result Value Ref Range   Glucose-Capillary 175 (H) 70 - 99 mg/dL  Urinalysis, Routine w reflex microscopic Urine, Clean Catch     Status: Abnormal   Collection Time: 03/18/20  9:57 AM  Result Value Ref Range   Color, Urine YELLOW YELLOW  APPearance TURBID (A) CLEAR   Specific Gravity, Urine 1.015 1.005 - 1.030   pH 5.0 5.0 - 8.0   Glucose, UA NEGATIVE NEGATIVE mg/dL   Hgb urine dipstick LARGE (A) NEGATIVE   Bilirubin Urine NEGATIVE NEGATIVE   Ketones, ur NEGATIVE NEGATIVE mg/dL   Protein, ur >=300 (A) NEGATIVE mg/dL   Nitrite POSITIVE (A) NEGATIVE   Leukocytes,Ua LARGE (A) NEGATIVE   RBC / HPF >50 (H) 0 - 5 RBC/hpf   WBC, UA >50 (H) 0 - 5 WBC/hpf   Bacteria, UA MANY (A) NONE SEEN   WBC Clumps PRESENT    Recent Results (from the past 240 hour(s))  SARS Coronavirus 2 by RT PCR (hospital order, performed in [place] hospital lab) Nasopharyngeal  Nasopharyngeal Swab     Status: None   Collection Time: 03/18/20  9:02 AM   Specimen: Nasopharyngeal Swab  Result Value Ref Range Status   SARS Coronavirus 2 NEGATIVE NEGATIVE Final    Comment: (NOTE) SARS-CoV-2 target nucleic acids are NOT DETECTED.  The SARS-CoV-2 RNA is generally detectable in upper and lower respiratory specimens during the acute phase of infection. The lowest concentration of SARS-CoV-2 viral copies this assay can detect is 250 copies / mL. A negative result does not preclude SARS-CoV-2 infection and should not be used as the sole basis for treatment or other patient management decisions.  A negative result may occur with improper specimen collection / handling, submission of specimen other than nasopharyngeal swab, presence of viral mutation(s) within the areas targeted by this assay, and inadequate number of viral copies (<250 copies / mL). A negative result must be combined with clinical observations, patient history, and epidemiological information.  Fact Sheet for Patients:   StrictlyIdeas.no  Fact Sheet for Healthcare Providers: BankingDealers.co.za  This test is not yet approved or  cleared by the [place] FDA and has been authorized for detection and/or diagnosis of SARS-CoV-2 by FDA under an Emergency Use Authorization (EUA).  This EUA will remain in effect (meaning this test can be used) for the duration of the COVID-19 declaration under Section 564(b)(1) of the Act, 21 U.S.C. section 360bbb-3(b)(1), unless the authorization is terminated or revoked sooner.  Performed at [facility], [place] [address], [place], [place] [address]     Renal Function: Recent Labs    03/18/20 0801  CREATININE 1.87*   Estimated Creatinine Clearance: 46.1 mL/min (A) (by C-G formula based on SCr of 1.87 mg/dL (H)).  Radiologic Imaging: DG Chest 2 View  Result Date: 03/18/2020 CLINICAL DATA:   Left-sided pain, initial encounter EXAM: CHEST - 2 VIEW COMPARISON:  10/20/2017 FINDINGS: Cardiac shadow is enlarged but accentuated by the frontal technique. The lungs are well aerated bilaterally. Mild central vascular congestion is seen. No focal infiltrate or effusion is noted. Degenerative changes of the thoracic spine are seen. IMPRESSION: Central vascular congestion without other acute abnormality. Electronically Signed   By: Inez Catalina M.D.   On: 03/18/2020 09:01   CT Renal Stone Study  Result Date: 03/18/2020 CLINICAL DATA:  Left lower abdominal pain that radiates into the left flank. Fever. EXAM: CT ABDOMEN AND PELVIS WITHOUT CONTRAST TECHNIQUE: Multidetector CT imaging of the abdomen and pelvis was performed following the standard protocol without IV contrast. COMPARISON:  01/26/2020 FINDINGS: Lower chest: Unremarkable. Hepatobiliary: The liver shows diffusely decreased attenuation suggesting fat deposition. 25.8 cm craniocaudal length of the liver is compatible with hepatomegaly. No focal abnormality in the liver on this study without intravenous contrast. Gallbladder  is surgically absent. No intrahepatic or extrahepatic biliary dilation. Pancreas: No focal mass lesion. No dilatation of the main duct. No intraparenchymal cyst. No peripancreatic edema. Spleen: No splenomegaly. No focal mass lesion. Adrenals/Urinary Tract: No adrenal nodule or mass. Cortical scarring again noted upper pole right kidney with stable exophytic 1.6 x 1.2 cm lesion stable since prior study and also when comparing back to an exam from 11/20/2010. Cluster of stones and/or staghorn calculus in the lower pole left kidney is similar. There is mild left hydroureteronephrosis on today's exam secondary to a 3 x 3 x 5 mm stone in the distal left ureter immediately proximal to the left UVJ (axial 76/2). Bladder is decompressed. Stomach/Bowel: Stomach is unremarkable. No gastric wall thickening. No evidence of outlet obstruction.  Duodenum is normally positioned as is the ligament of Treitz. No small bowel wall thickening. No small bowel dilatation. The terminal ileum is normal. The appendix is normal. No gross colonic mass. No colonic wall thickening. Vascular/Lymphatic: There is abdominal aortic atherosclerosis without aneurysm. There is no gastrohepatic or hepatoduodenal ligament lymphadenopathy. No retroperitoneal or mesenteric lymphadenopathy. No pelvic sidewall lymphadenopathy. Reproductive: The uterus is surgically absent. There is no adnexal mass. Other: No intraperitoneal free fluid. Musculoskeletal: Marked laxity of the ventral fascia noted, with prominent pannus. Similar appearance of umbilical hernia containing only fat. No worrisome lytic or sclerotic osseous abnormality. IMPRESSION: 1. 3 x 3 x 5 mm distal left ureteral stone with mild left hydroureteronephrosis. 2. Cluster of stones and/or staghorn calculus in the lower pole left kidney, similar to prior. 3. Hepatomegaly with hepatic steatosis. 4. Stable exophytic lesion upper pole right kidney since 2012, likely a cyst complicated by proteinaceous debris or hemorrhage. 5. Aortic Atherosclerosis (ICD10-I70.0). Electronically Signed   By: Misty Stanley M.D.   On: 03/18/2020 10:29    I independently reviewed the above imaging studies.  Impression/Recommendation 54 year old female with septic shock due to an obstructing 5 mm left distal ureteral calculus  -Blood and urine cultures are pending.  She received an empiric dose of Rocephin in the emergency room.  Critical care medicine is taking over as primary and will adjust antibiotics as necessary. -The risks, benefits and alternatives of cystoscopy with LEFT JJ stent placement was discussed with the patient.  Risks include, but are not limited to: bleeding, urinary tract infection, ureteral injury, ureteral stricture disease, chronic pain, urinary symptoms, bladder injury, stent migration, the need for nephrostomy tube  placement, MI, CVA, DVT, PE and the inherent risks with general anesthesia.  The patient voices understanding and wishes to proceed.    Ellison Hughs, MD [facility] 03/18/2020, 11:34 AM

## 2020-03-18 NOTE — Progress Notes (Signed)
[facility] Progress Note Patient Name: Gina [name] DOB: Jul 05, 1966 MRN: [id number]   Date of Service  03/18/2020  HPI/Events of Note  Multiple issues: 1. Oliguria - Urnien output for shift only 150 mL and 2. Nausea - QTc interval = 0.428 seconds.   eICU Interventions  Plan: 1. Zofran 4 mg IV Q 8 hours PRN N/V. 2. Bolus with 0.9 NaCL 1 liter IV over 1 hour now.      Intervention Category Major Interventions: Other:  Lysle Dingwall 03/18/2020, 10:25 PM

## 2020-03-18 NOTE — Anesthesia Procedure Notes (Signed)
Procedure Name: Intubation Date/Time: 03/18/2020 12:25 PM Performed by: West Pugh, CRNA Pre-anesthesia Checklist: Patient identified, Emergency Drugs available, Suction available, Patient being monitored and Timeout performed Patient Re-evaluated:Patient Re-evaluated prior to induction Oxygen Delivery Method: Circle system utilized Preoxygenation: Pre-oxygenation with 100% oxygen Induction Type: IV induction, Rapid sequence and Cricoid Pressure applied Laryngoscope Size: 3 and Glidescope Grade View: Grade I Tube type: Oral Tube size: 7.0 mm Number of attempts: 1 Airway Equipment and Method: Stylet Placement Confirmation: ETT inserted through vocal cords under direct vision,  positive ETCO2 and breath sounds checked- equal and bilateral Secured at: 22 cm Tube secured with: Tape Dental Injury: Teeth and Oropharynx as per pre-operative assessment  Difficulty Due To: Difficulty was anticipated and Difficult Airway- due to limited oral opening Comments: Elective glidescope intubation.

## 2020-03-18 NOTE — Anesthesia Preprocedure Evaluation (Signed)
Anesthesia Evaluation  Patient identified by MRN, date of birth, ID band Patient awake    Reviewed: Allergy & Precautions, NPO status , Patient's Chart, lab work & pertinent test results  History of Anesthesia Complications (+) PONV, Family history of anesthesia reaction and history of anesthetic complications  Airway Mallampati: III  TM Distance: >3 FB Neck ROM: Full    Dental no notable dental hx. (+) Poor Dentition, Missing, Chipped   Pulmonary neg pulmonary ROS, shortness of breath and with exertion, asthma , pneumonia, resolved, Current Smoker and Patient abstained from smoking.,    Pulmonary exam normal breath sounds clear to auscultation       Cardiovascular hypertension, Pt. on medications negative cardio ROS Normal cardiovascular exam Rhythm:Regular Rate:Normal     Neuro/Psych  Headaches, Anxiety negative neurological ROS  negative psych ROS   GI/Hepatic negative GI ROS, Neg liver ROS, GERD  Medicated and Controlled,IBS RLQ pain N/V   Endo/Other  negative endocrine ROSdiabetes, Well Controlled, Type 2, Oral Hypoglycemic Agents, Insulin DependentMorbid obesity  Renal/GU negative Renal ROSHx/o renal calculi  negative genitourinary   Musculoskeletal negative musculoskeletal ROS (+) Arthritis , Osteoarthritis,  Fibromyalgia -  Abdominal (+) + obese,   Peds negative pediatric ROS (+)  Hematology negative hematology ROS (+) anemia , Hx/o eosinophilia   Anesthesia Other Findings   Reproductive/Obstetrics negative OB ROS                             Anesthesia Physical  Anesthesia Plan  ASA: III and emergent  Anesthesia Plan: General   Post-op Pain Management:    Induction: Intravenous, Rapid sequence and Cricoid pressure planned  PONV Risk Score and Plan: 3 and Ondansetron, Dexamethasone, Midazolam and Treatment may vary due to age or medical condition  Airway Management  Planned: Oral ETT  Additional Equipment:   Intra-op Plan:   Post-operative Plan:   Informed Consent: I have reviewed the patients History and Physical, chart, labs and discussed the procedure including the risks, benefits and alternatives for the proposed anesthesia with the patient or authorized representative who has indicated his/her understanding and acceptance.     Dental advisory given  Plan Discussed with: Anesthesiologist, CRNA and Surgeon  Anesthesia Plan Comments:         Anesthesia Quick Evaluation

## 2020-03-18 NOTE — ED Triage Notes (Signed)
Patient c/o left lower abdominal pain that radiates into the left flank. Patient states she has been having a fever x 3 days as high as 104. Patient took Tylenol 650 mg at 0500 today and T. In triage 101.1.  Patient reports a history of kidney stones. Patient also c/o dysuria and urine with a foul odor. Patient states she has "a sore on the right lower abdomen that she has had a 1 week.

## 2020-03-18 NOTE — Consult Note (Signed)
Urology Consult   Physician requesting consult: Marye Round, MD  Reason for consult: Septic shock due to obstructing left UVJ stone  History of Present Illness: Gina [name] is a 54 y.o.-year-old female with a history of kidney stones.  She reports a 3-day history of worsening sharp, constant, nonradiating left flank pain associated with nausea and vomiting as well as subjective fevers of 101 F at home.  CT imaging from this morning reveals an obstructing 5 mm left UVJ calculus with associated hydronephrosis.  She was found to be hypotensive and febrile in the emergency room.   Past Medical History:  Diagnosis Date   Anemia    Anxiety    Arthritis    Asthma    Back pain    Chronic pain    Diabetes mellitus    type 2   Family history of adverse reaction to anesthesia    youngest daughter hard to wake up both daughters post op n/v   Fibromyalgia    GERD (gastroesophageal reflux disease)    Headache    migraines   History of kidney stones    2 big stones in left kidney now   History of recurrent UTIs    last uti 3 weeks ago   Hyperlipidemia    Hypertension    IBS (irritable bowel syndrome)    Obesities, morbid (HCC)    Pneumonia 06/2016   PONV (postoperative nausea and vomiting) 2004   came back after hystectomy bowels locked up on life support for 1 month    Sinusitis    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 at [facility]   Umbilical hernia    Wound discharge    open wound right leg going to wound center changes dressing qday with santyl cream quarter size wound    Past Surgical History:  Procedure Laterality Date   ABDOMINAL HYSTERECTOMY     1 ovary left   CARPAL TUNNEL RELEASE Right    CESAREAN SECTION     x 2   CHOLECYSTECTOMY     COLONOSCOPY WITH PROPOFOL N/A 12/03/2016   Procedure: COLONOSCOPY WITH PROPOFOL;  Surgeon: Doran Stabler, MD;  Location: [facility];  Service: Gastroenterology;  Laterality: N/A;   CYSTECTOMY      left foot   ESOPHAGOGASTRODUODENOSCOPY (EGD) WITH PROPOFOL N/A 12/03/2016   Procedure: ESOPHAGOGASTRODUODENOSCOPY (EGD) WITH PROPOFOL;  Surgeon: Doran Stabler, MD;  Location: [facility];  Service: Gastroenterology;  Laterality: N/A;   LITHOTRIPSY     MULTIPLE TOOTH EXTRACTIONS      Current Hospital Medications:  Home Meds:  Current Meds  Medication Sig   acetaminophen (TYLENOL) 325 MG tablet Take 650 mg by mouth every 6 (six) hours as needed for mild pain or headache.   ALPRAZolam (XANAX) 1 MG tablet Take 1 mg by mouth 2 (two) times daily as needed for anxiety.    Aspirin-Salicylamide-Caffeine (BC HEADACHE POWDER PO) Take 1 packet by mouth as needed (headache/pain).   atorvastatin (LIPITOR) 40 MG tablet Take 40 mg by mouth daily.   fluticasone (FLONASE) 50 MCG/ACT nasal spray Place 1 spray into both nostrils daily as needed for allergies.    Fluticasone-Salmeterol (WIXELA INHUB) 250-50 MCG/DOSE AEPB Inhale 1 puff into the lungs 2 (two) times daily.   ibuprofen (ADVIL) 200 MG tablet Take 400 mg by mouth every 6 (six) hours as needed for headache or mild pain.   lisinopril (ZESTRIL) 10 MG tablet Take 10 mg by mouth daily.  metFORMIN (GLUCOPHAGE) 850 MG tablet TAKE 1 TABLET (850 MG TOTAL) BY MOUTH 2 (TWO) TIMES DAILY WITH A MEAL. (Patient taking differently: Take 425 mg by mouth daily. 1/2 tablet daily)   nystatin (NYSTATIN) powder Apply topically 3 (three) times daily as needed. (Patient taking differently: Apply 1 application topically 3 (three) times daily as needed (chaffing). )   omeprazole (PRILOSEC) 20 MG capsule TAKE 1 CAPSULE (20 MG TOTAL) BY MOUTH DAILY BEFORE BREAKFAST.   OZEMPIC, 0.25 OR 0.5 MG/DOSE, 2 MG/1.5ML SOPN Inject 2 mg into the skin once a week.   polyvinyl alcohol (LIQUIFILM TEARS) 1.4 % ophthalmic solution Place 1 drop into both eyes as needed for dry eyes.   pregabalin (LYRICA) 200 MG capsule TAKE 1 CAPSULE BY MOUTH THREE TIMES A DAY    VENTOLIN HFA 108 (90 Base) MCG/ACT inhaler INHALE 2 PUFFS EVERY 6 (SIX) HOURS AS NEEDED INTO THE LUNGS FOR WHEEZING OR SHORTNESS OF BREATH. (Patient taking differently: Inhale 2 puffs into the lungs every 6 (six) hours as needed for wheezing or shortness of breath. )    Scheduled Meds:  sodium chloride flush  3 mL Intravenous Once   Continuous Infusions:  sodium chloride 150 mL/hr at 03/18/20 1045   cefTRIAXone (ROCEPHIN)  IV Stopped (03/18/20 1002)   PRN Meds:.  Allergies:  Allergies  Allergen Reactions   Darvocet [Propoxyphene N-Acetaminophen] Nausea And Vomiting   Morphine And Related Itching   Penicillins Hives and Swelling    Tolerates Rocephin  Swelling all over body  Has patient had a PCN reaction causing immediate rash, facial/tongue/throat swelling, SOB or lightheadedness with hypotension: Yes Has patient had a PCN reaction causing severe rash involving mucus membranes or skin necrosis: Yes Has patient had a PCN reaction that required hospitalization Unknown Has patient had a PCN reaction occurring within the last 10 years: No  If all of the above answers are "NO", then may proceed with Cephalosporin use.    Tape Hives    Adhesive tape    Family History  Problem Relation Age of Onset   Cancer Mother    Cirrhosis Mother    Heart failure Father    Diabetes Father    Hypertension Father    Colon cancer Neg Hx     Social History:  reports that she has been smoking cigarettes. She has a 16.50 pack-year smoking history. She has never used smokeless tobacco. She reports that she does not drink alcohol and does not use drugs.  ROS: A complete review of systems was performed.  All systems are negative except for pertinent findings as noted.  Physical Exam:  Vital signs in last 24 hours: Temp:  [98.1 F (36.7 C)-101.1 F (38.4 C)] 98.1 F (36.7 C) (08/09 1055) Pulse Rate:  [84-114] 85 (08/09 1115) Resp:  [13-20] 15 (08/09 1115) BP: (72-124)/(38-50)  87/40 (08/09 1115) SpO2:  [90 %-94 %] 90 % (08/09 1115) Weight:  [136.1 kg] 136.1 kg (08/09 0757) Constitutional:  Alert and oriented, restless and appears uncomfortable Cardiovascular: Regular rate and rhythm, No JVD Respiratory: Normal respiratory effort, Lungs clear bilaterally GI: morbid obesity, nontender, nondistended, no abdominal masses GU: Left CVA tenderness Lymphatic: No lymphadenopathy Neurologic: Grossly intact, no focal deficits Psychiatric: Normal mood and affect  Laboratory Data:  Recent Labs    03/18/20 0801  WBC 13.8*  HGB 12.1  HCT 40.1  PLT 167    Recent Labs    03/18/20 0801  NA 129*  K 3.8  CL 98  GLUCOSE 191*  BUN 27*  CALCIUM 8.3*  CREATININE 1.87*     Results for orders placed or performed during the hospital encounter of 03/18/20 (from the past 24 hour(s))  Lactic acid, plasma     Status: Abnormal   Collection Time: 03/18/20  8:01 AM  Result Value Ref Range   Lactic Acid, Venous 2.2 (HH) 0.5 - 1.9 mmol/L  Comprehensive metabolic panel     Status: Abnormal   Collection Time: 03/18/20  8:01 AM  Result Value Ref Range   Sodium 129 (L) 135 - 145 mmol/L   Potassium 3.8 3.5 - 5.1 mmol/L   Chloride 98 98 - 111 mmol/L   CO2 19 (L) 22 - 32 mmol/L   Glucose, Bld 191 (H) 70 - 99 mg/dL   BUN 27 (H) 6 - 20 mg/dL   Creatinine, Ser 1.87 (H) 0.44 - 1.00 mg/dL   Calcium 8.3 (L) 8.9 - 10.3 mg/dL   Total Protein 7.0 6.5 - 8.1 g/dL   Albumin 3.5 3.5 - 5.0 g/dL   AST 21 15 - 41 U/L   ALT 19 0 - 44 U/L   Alkaline Phosphatase 76 38 - 126 U/L   Total Bilirubin 0.8 0.3 - 1.2 mg/dL   GFR calc non Af Amer 30 (L) >60 mL/min   GFR calc Af Amer 34 (L) >60 mL/min   Anion gap 12 5 - 15  CBC with Differential     Status: Abnormal   Collection Time: 03/18/20  8:01 AM  Result Value Ref Range   WBC 13.8 (H) 4.0 - 10.5 K/uL   RBC 4.74 3.87 - 5.11 MIL/uL   Hemoglobin 12.1 12.0 - 15.0 g/dL   HCT 40.1 36 - 46 %   MCV 84.6 80.0 - 100.0 fL   MCH 25.5 (L) 26.0 -  34.0 pg   MCHC 30.2 30.0 - 36.0 g/dL   RDW 20.4 (H) 11.5 - 15.5 %   Platelets 167 150 - 400 K/uL   nRBC 0.0 0.0 - 0.2 %   Neutrophils Relative % 94 %   Neutro Abs 13.0 (H) 1.7 - 7.7 K/uL   Lymphocytes Relative 2 %   Lymphs Abs 0.2 (L) 0.7 - 4.0 K/uL   Monocytes Relative 3 %   Monocytes Absolute 0.4 0 - 1 K/uL   Eosinophils Relative 0 %   Eosinophils Absolute 0.0 0 - 0 K/uL   Basophils Relative 0 %   Basophils Absolute 0.0 0 - 0 K/uL   Immature Granulocytes 1 %   Abs Immature Granulocytes 0.09 (H) 0.00 - 0.07 K/uL  I-Stat beta hCG blood, ED     Status: None   Collection Time: 03/18/20  8:23 AM  Result Value Ref Range   I-stat hCG, quantitative <5.0 <5 mIU/mL   Comment 3          SARS Coronavirus 2 by RT PCR (hospital order, performed in [place] hospital lab) Nasopharyngeal Nasopharyngeal Swab     Status: None   Collection Time: 03/18/20  9:02 AM   Specimen: Nasopharyngeal Swab  Result Value Ref Range   SARS Coronavirus 2 NEGATIVE NEGATIVE  POC CBG, ED     Status: Abnormal   Collection Time: 03/18/20  9:13 AM  Result Value Ref Range   Glucose-Capillary 175 (H) 70 - 99 mg/dL  Urinalysis, Routine w reflex microscopic Urine, Clean Catch     Status: Abnormal   Collection Time: 03/18/20  9:57 AM  Result Value Ref Range   Color, Urine YELLOW YELLOW  APPearance TURBID (A) CLEAR   Specific Gravity, Urine 1.015 1.005 - 1.030   pH 5.0 5.0 - 8.0   Glucose, UA NEGATIVE NEGATIVE mg/dL   Hgb urine dipstick LARGE (A) NEGATIVE   Bilirubin Urine NEGATIVE NEGATIVE   Ketones, ur NEGATIVE NEGATIVE mg/dL   Protein, ur >=300 (A) NEGATIVE mg/dL   Nitrite POSITIVE (A) NEGATIVE   Leukocytes,Ua LARGE (A) NEGATIVE   RBC / HPF >50 (H) 0 - 5 RBC/hpf   WBC, UA >50 (H) 0 - 5 WBC/hpf   Bacteria, UA MANY (A) NONE SEEN   WBC Clumps PRESENT    Recent Results (from the past 240 hour(s))  SARS Coronavirus 2 by RT PCR (hospital order, performed in [place] hospital lab) Nasopharyngeal  Nasopharyngeal Swab     Status: None   Collection Time: 03/18/20  9:02 AM   Specimen: Nasopharyngeal Swab  Result Value Ref Range Status   SARS Coronavirus 2 NEGATIVE NEGATIVE Final    Comment: (NOTE) SARS-CoV-2 target nucleic acids are NOT DETECTED.  The SARS-CoV-2 RNA is generally detectable in upper and lower respiratory specimens during the acute phase of infection. The lowest concentration of SARS-CoV-2 viral copies this assay can detect is 250 copies / mL. A negative result does not preclude SARS-CoV-2 infection and should not be used as the sole basis for treatment or other patient management decisions.  A negative result may occur with improper specimen collection / handling, submission of specimen other than nasopharyngeal swab, presence of viral mutation(s) within the areas targeted by this assay, and inadequate number of viral copies (<250 copies / mL). A negative result must be combined with clinical observations, patient history, and epidemiological information.  Fact Sheet for Patients:   StrictlyIdeas.no  Fact Sheet for Healthcare Providers: BankingDealers.co.za  This test is not yet approved or  cleared by the [place] FDA and has been authorized for detection and/or diagnosis of SARS-CoV-2 by FDA under an Emergency Use Authorization (EUA).  This EUA will remain in effect (meaning this test can be used) for the duration of the COVID-19 declaration under Section 564(b)(1) of the Act, 21 U.S.C. section 360bbb-3(b)(1), unless the authorization is terminated or revoked sooner.  Performed at [facility], [place] [address] [place], [place] [address]     Renal Function: Recent Labs    03/18/20 0801  CREATININE 1.87*   Estimated Creatinine Clearance: 46.1 mL/min (A) (by C-G formula based on SCr of 1.87 mg/dL (H)).  Radiologic Imaging: DG Chest 2 View  Result Date: 03/18/2020 CLINICAL DATA:   Left-sided pain, initial encounter EXAM: CHEST - 2 VIEW COMPARISON:  10/20/2017 FINDINGS: Cardiac shadow is enlarged but accentuated by the frontal technique. The lungs are well aerated bilaterally. Mild central vascular congestion is seen. No focal infiltrate or effusion is noted. Degenerative changes of the thoracic spine are seen. IMPRESSION: Central vascular congestion without other acute abnormality. Electronically Signed   By: Inez Catalina M.D.   On: 03/18/2020 09:01   CT Renal Stone Study  Result Date: 03/18/2020 CLINICAL DATA:  Left lower abdominal pain that radiates into the left flank. Fever. EXAM: CT ABDOMEN AND PELVIS WITHOUT CONTRAST TECHNIQUE: Multidetector CT imaging of the abdomen and pelvis was performed following the standard protocol without IV contrast. COMPARISON:  01/26/2020 FINDINGS: Lower chest: Unremarkable. Hepatobiliary: The liver shows diffusely decreased attenuation suggesting fat deposition. 25.8 cm craniocaudal length of the liver is compatible with hepatomegaly. No focal abnormality in the liver on this study without intravenous contrast. Gallbladder  is surgically absent. No intrahepatic or extrahepatic biliary dilation. Pancreas: No focal mass lesion. No dilatation of the main duct. No intraparenchymal cyst. No peripancreatic edema. Spleen: No splenomegaly. No focal mass lesion. Adrenals/Urinary Tract: No adrenal nodule or mass. Cortical scarring again noted upper pole right kidney with stable exophytic 1.6 x 1.2 cm lesion stable since prior study and also when comparing back to an exam from 11/20/2010. Cluster of stones and/or staghorn calculus in the lower pole left kidney is similar. There is mild left hydroureteronephrosis on today's exam secondary to a 3 x 3 x 5 mm stone in the distal left ureter immediately proximal to the left UVJ (axial 76/2). Bladder is decompressed. Stomach/Bowel: Stomach is unremarkable. No gastric wall thickening. No evidence of outlet obstruction.  Duodenum is normally positioned as is the ligament of Treitz. No small bowel wall thickening. No small bowel dilatation. The terminal ileum is normal. The appendix is normal. No gross colonic mass. No colonic wall thickening. Vascular/Lymphatic: There is abdominal aortic atherosclerosis without aneurysm. There is no gastrohepatic or hepatoduodenal ligament lymphadenopathy. No retroperitoneal or mesenteric lymphadenopathy. No pelvic sidewall lymphadenopathy. Reproductive: The uterus is surgically absent. There is no adnexal mass. Other: No intraperitoneal free fluid. Musculoskeletal: Marked laxity of the ventral fascia noted, with prominent pannus. Similar appearance of umbilical hernia containing only fat. No worrisome lytic or sclerotic osseous abnormality. IMPRESSION: 1. 3 x 3 x 5 mm distal left ureteral stone with mild left hydroureteronephrosis. 2. Cluster of stones and/or staghorn calculus in the lower pole left kidney, similar to prior. 3. Hepatomegaly with hepatic steatosis. 4. Stable exophytic lesion upper pole right kidney since 2012, likely a cyst complicated by proteinaceous debris or hemorrhage. 5. Aortic Atherosclerosis (ICD10-I70.0). Electronically Signed   By: Misty Stanley M.D.   On: 03/18/2020 10:29    I independently reviewed the above imaging studies.  Impression/Recommendation 54 year old female with septic shock due to an obstructing 5 mm left distal ureteral calculus  -Blood and urine cultures are pending.  She received an empiric dose of Rocephin in the emergency room.  Critical care medicine is taking over as primary and will adjust antibiotics as necessary. -The risks, benefits and alternatives of cystoscopy with LEFT JJ stent placement was discussed with the patient.  Risks include, but are not limited to: bleeding, urinary tract infection, ureteral injury, ureteral stricture disease, chronic pain, urinary symptoms, bladder injury, stent migration, the need for nephrostomy tube  placement, MI, CVA, DVT, PE and the inherent risks with general anesthesia.  The patient voices understanding and wishes to proceed.    Ellison Hughs, MD [facility] 03/18/2020, 11:34 AM

## 2020-03-18 NOTE — H&P (Signed)
NAME:  Gina [name], MRN:  [id number], DOB:  [birth year], LOS: 0 ADMISSION DATE:  03/18/2020, CONSULTATION DATE:  NA REFERRING MD:  Winter, CHIEF COMPLAINT:  Septic shock   Brief History   54 year old female who presented to the ED on 8/9 w/ cc: sharp left flank pain. In ER was febrile, tachycardic, met SIRS/sepsis criteria. Initially was normotensive but became hypotensive in the ER in spite of IV crystalloid resuscitation. CT imaging showed left obstructing 54m stone w/ associated hydro. Urology was called. The patient went to the OR where she underwent cystoscopy and Left JJ stent. PCCM asked to admit  History of present illness   See above   Past Medical History  Obesity, anemia, asthma, dm type II, GERD, Fibromyalgia, kidney stones, recurrent UTIs Significant Hospital Events   8/9 admitted   Consults:  Urology   Procedures:  8/9 cystoscopy and Left JJ stent.  Significant Diagnostic Tests:  CT renal 8/9: 1. 3 x 3 x 5 mm distal left ureteral stone with mild left hydroureteronephrosis.2. Cluster of stones and/or staghorn calculus in the lower pole left kidney, similar to prior.3. Hepatomegaly with hepatic steatosis.4. Stable exophytic lesion upper pole right kidney since 26387,FIEPPIa cyst complicated by proteinaceous debris or hemorrhage.5. Aortic Atherosclerosis (ICD10-I70.0).   Micro Data:  UC 8/9>>> BC 8/9>>>  Antimicrobials:  Ceftriaxone 8/9  Interim history/subjective:  Complaining of left flank pain  Objective   Blood pressure (Abnormal) 77/41, pulse 77, temperature 98.4 F (36.9 C), temperature source Oral, resp. rate (Abnormal) 22, height '5\' 3"'  (1.6 m), weight 136.1 kg, SpO2 100 %.       No intake or output data in the 24 hours ending 03/18/20 1305 Filed Weights   03/18/20 0757 03/18/20 1144  Weight: 136.1 kg 136.1 kg    Examination: General: Obese 54year old white female resting in bed currently in no acute distress but is endorsing discomfort as  mentioned above HENT: Mucous membranes moist sclera nonicteric no JVD Lungs: Clear to auscultation no accessory use does have mild upper airway wheeze Cardiovascular: Regular rate and rhythm without murmur rub or gallop Abdomen: Soft nontender Extremities: Warm dry Neuro: I awake oriented GU: Bloody urine output  Resolved Hospital Problem list     Assessment & Plan:  Severe sepsis/septic shock secondary to urinary tract source Plan Continue IV fluids Peripheral phenylephrine as indicated for map greater than 65, has been off this since arrival at PACU Culture sent, continue ceftriaxone Repeat lactic acid Admit to ICU/stepdown Trend CBC and fever curve   obstructive uropathy due to left renal calculi now status post left JJ stent Plan Further recommendations per urology  Acute kidney injury with lactic acidosis -Secondary to sepsis Plan Follow-up chemistry and lactate given volume resuscitation efforts Renal dose medications Strict intake output Keeping Foley in place  Fluid electrolyte imbalance: Hyponatremia In setting of volume depletion Volume resuscitated Plan Repeat chemistry  Strict intake output  Best practice:  Diet: adv Pain/Anxiety/Delirium protocol (if indicated): NNA VAP protocol (if indicated): NA DVT prophylaxis:  heparin GI prophylaxis: PPI Glucose control: ssi Mobility: BR Code Status: full code  Family Communication: pending  Disposition: admit to ICU   Labs   CBC: Recent Labs  Lab 03/18/20 0801  WBC 13.8*  NEUTROABS 13.0*  HGB 12.1  HCT 40.1  MCV 84.6  PLT 1951   Basic Metabolic Panel: Recent Labs  Lab 03/18/20 0801  NA 129*  K 3.8  CL 98  CO2 19*  GLUCOSE 191*  BUN 27*  CREATININE 1.87*  CALCIUM 8.3*   GFR: Estimated Creatinine Clearance: 46.1 mL/min (A) (by C-G formula based on SCr of 1.87 mg/dL (H)). Recent Labs  Lab 03/18/20 0801  WBC 13.8*  LATICACIDVEN 2.2*    Liver Function Tests: Recent Labs  Lab  03/18/20 0801  AST 21  ALT 19  ALKPHOS 76  BILITOT 0.8  PROT 7.0  ALBUMIN 3.5   No results for input(s): LIPASE, AMYLASE in the last 168 hours. No results for input(s): AMMONIA in the last 168 hours.  ABG    Component Value Date/Time   TCO2 26 05/09/2008 0900     Coagulation Profile: No results for input(s): INR, PROTIME in the last 168 hours.  Cardiac Enzymes: No results for input(s): CKTOTAL, CKMB, CKMBINDEX, TROPONINI in the last 168 hours.  HbA1C: Hgb A1c MFr Bld  Date/Time Value Ref Range Status  06/25/2017 10:53 AM 6.4 4.6 - 6.5 % Final    Comment:    Glycemic Control Guidelines for People with Diabetes:Non Diabetic:  <6%Goal of Therapy: <7%Additional Action Suggested:  >8%   02/11/2017 08:06 PM 6.4 (H) 4.8 - 5.6 % Final    Comment:    (NOTE)         Pre-diabetes: 5.7 - 6.4         Diabetes: >6.4         Glycemic control for adults with diabetes: <7.0     CBG: Recent Labs  Lab 03/18/20 0913 03/18/20 1148  GLUCAP 175* 174*    Review of Systems:   Not able   Past Medical History  She,  has a past medical history of Anemia, Anxiety, Arthritis, Asthma, Back pain, Chronic pain, Diabetes mellitus, Family history of adverse reaction to anesthesia, Fibromyalgia, GERD (gastroesophageal reflux disease), Headache, History of kidney stones, History of recurrent UTIs, Hyperlipidemia, Hypertension, IBS (irritable bowel syndrome), Obesities, morbid ([place]), Pneumonia (06/2016), PONV (postoperative nausea and vomiting) (2004), Sinusitis, Tumor cells, Umbilical hernia, and Wound discharge.   Surgical History    Past Surgical History:  Procedure Laterality Date  . ABDOMINAL HYSTERECTOMY     1 ovary left  . CARPAL TUNNEL RELEASE Right   . CESAREAN SECTION     x 2  . CHOLECYSTECTOMY    . COLONOSCOPY WITH PROPOFOL N/A 12/03/2016   Procedure: COLONOSCOPY WITH PROPOFOL;  Surgeon: Doran Stabler, MD;  Location: [facility];  Service: Gastroenterology;  Laterality: N/A;    . CYSTECTOMY     left foot  . ESOPHAGOGASTRODUODENOSCOPY (EGD) WITH PROPOFOL N/A 12/03/2016   Procedure: ESOPHAGOGASTRODUODENOSCOPY (EGD) WITH PROPOFOL;  Surgeon: Doran Stabler, MD;  Location: [facility];  Service: Gastroenterology;  Laterality: N/A;  . LITHOTRIPSY    . MULTIPLE TOOTH EXTRACTIONS       Social History   reports that she has been smoking cigarettes. She has a 16.50 pack-year smoking history. She has never used smokeless tobacco. She reports that she does not drink alcohol and does not use drugs.   Family History   Her family history includes Cancer in her mother; Cirrhosis in her mother; Diabetes in her father; Heart failure in her father; Hypertension in her father. There is no history of Colon cancer.   Allergies Allergies  Allergen Reactions  . Darvocet [Propoxyphene N-Acetaminophen] Nausea And Vomiting  . Morphine And Related Itching  . Penicillins Hives and Swelling    Tolerates Rocephin  Swelling all over body  Has patient had a PCN reaction causing immediate rash, facial/tongue/throat swelling,  SOB or lightheadedness with hypotension: Yes Has patient had a PCN reaction causing severe rash involving mucus membranes or skin necrosis: Yes Has patient had a PCN reaction that required hospitalization Unknown Has patient had a PCN reaction occurring within the last 10 years: No  If all of the above answers are "NO", then may proceed with Cephalosporin use.   . Tape Hives    Adhesive tape  . Other     Bee venom-anaphylaxis      Home Medications  Prior to Admission medications   Medication Sig Start Date End Date Taking? Authorizing Provider  acetaminophen (TYLENOL) 325 MG tablet Take 650 mg by mouth every 6 (six) hours as needed for mild pain or headache.   Yes [provider]  ALPRAZolam Duanne Moron) 1 MG tablet Take 1 mg by mouth 2 (two) times daily as needed for anxiety.    Yes [provider]  Aspirin-Salicylamide-Caffeine (BC HEADACHE  POWDER PO) Take 1 packet by mouth as needed (headache/pain).   Yes [provider]  atorvastatin (LIPITOR) 40 MG tablet Take 40 mg by mouth daily. 01/09/20  Yes [provider]  fluticasone (FLONASE) 50 MCG/ACT nasal spray Place 1 spray into both nostrils daily as needed for allergies.  06/18/16  Yes [provider]  Fluticasone-Salmeterol (WIXELA INHUB) 250-50 MCG/DOSE AEPB Inhale 1 puff into the lungs 2 (two) times daily. 12/22/18  Yes [place], Betty G, MD  ibuprofen (ADVIL) 200 MG tablet Take 400 mg by mouth every 6 (six) hours as needed for headache or mild pain.   Yes [provider]  lisinopril (ZESTRIL) 10 MG tablet Take 10 mg by mouth daily. 10/12/19  Yes [provider]  metFORMIN (GLUCOPHAGE) 850 MG tablet TAKE 1 TABLET (850 MG TOTAL) BY MOUTH 2 (TWO) TIMES DAILY WITH A MEAL. Patient taking differently: Take 425 mg by mouth daily. 1/2 tablet daily 03/24/18  Yes [place], Betty G, MD  nystatin (NYSTATIN) powder Apply topically 3 (three) times daily as needed. Patient taking differently: Apply 1 application topically 3 (three) times daily as needed (chaffing).  01/17/18  Yes [place], Betty G, MD  omeprazole (PRILOSEC) 20 MG capsule TAKE 1 CAPSULE (20 MG TOTAL) BY MOUTH DAILY BEFORE BREAKFAST. 07/03/19  Yes [place], Betty G, MD  OZEMPIC, 0.25 OR 0.5 MG/DOSE, 2 MG/1.5ML SOPN Inject 2 mg into the skin once a week. 03/05/20  Yes [provider]  polyvinyl alcohol (LIQUIFILM TEARS) 1.4 % ophthalmic solution Place 1 drop into both eyes as needed for dry eyes.   Yes [provider]  pregabalin (LYRICA) 200 MG capsule TAKE 1 CAPSULE BY MOUTH THREE TIMES A DAY 01/17/18  Yes [place], Betty G, MD  VENTOLIN HFA 108 (90 Base) MCG/ACT inhaler INHALE 2 PUFFS EVERY 6 (SIX) HOURS AS NEEDED INTO THE LUNGS FOR WHEEZING OR SHORTNESS OF BREATH. Patient taking differently: Inhale 2 puffs into the lungs every 6 (six) hours as needed for wheezing or shortness of breath.   06/02/18  Yes [place], Betty G, MD  atorvastatin (LIPITOR) 20 MG tablet TAKE 1 TABLET (20 MG TOTAL) DAILY BY MOUTH. Patient not taking: Reported on 03/18/2020 03/18/18   [place], Betty G, MD  glucose blood (ONE TOUCH ULTRA TEST) test strip USE TO TEST BLOOD SUGAR 3 TIMES DAILY 06/16/18   [place], Betty G, MD  NOVOLOG MIX 70/30 FLEXPEN (70-30) 100 UNIT/ML FlexPen INJECT 12 UNITS TWO TIMES DAILY Patient not taking: Reported on 03/05/2020 01/11/19   [place], Betty G, MD  Critical care time: 32 minutes.      Erick Colace ACNP-BC [place] Pager # [phone] OR # [phone] if no answer

## 2020-03-19 ENCOUNTER — Inpatient Hospital Stay (HOSPITAL_COMMUNITY): Payer: Medicare Other

## 2020-03-19 ENCOUNTER — Encounter (HOSPITAL_COMMUNITY): Payer: Self-pay | Admitting: Urology

## 2020-03-19 DIAGNOSIS — Z6841 Body Mass Index (BMI) 40.0 and over, adult: Secondary | ICD-10-CM

## 2020-03-19 DIAGNOSIS — E875 Hyperkalemia: Secondary | ICD-10-CM | POA: Diagnosis present

## 2020-03-19 DIAGNOSIS — N10 Acute tubulo-interstitial nephritis: Secondary | ICD-10-CM | POA: Diagnosis present

## 2020-03-19 DIAGNOSIS — A419 Sepsis, unspecified organism: Secondary | ICD-10-CM | POA: Diagnosis present

## 2020-03-19 DIAGNOSIS — E1140 Type 2 diabetes mellitus with diabetic neuropathy, unspecified: Secondary | ICD-10-CM

## 2020-03-19 DIAGNOSIS — Z720 Tobacco use: Secondary | ICD-10-CM | POA: Diagnosis present

## 2020-03-19 DIAGNOSIS — K219 Gastro-esophageal reflux disease without esophagitis: Secondary | ICD-10-CM

## 2020-03-19 DIAGNOSIS — R062 Wheezing: Secondary | ICD-10-CM | POA: Clinically undetermined

## 2020-03-19 DIAGNOSIS — I10 Essential (primary) hypertension: Secondary | ICD-10-CM

## 2020-03-19 DIAGNOSIS — G629 Polyneuropathy, unspecified: Secondary | ICD-10-CM

## 2020-03-19 DIAGNOSIS — E785 Hyperlipidemia, unspecified: Secondary | ICD-10-CM

## 2020-03-19 LAB — BASIC METABOLIC PANEL
Anion gap: 10 (ref 5–15)
Anion gap: 9 (ref 5–15)
BUN: 28 mg/dL — ABNORMAL HIGH (ref 6–20)
BUN: 33 mg/dL — ABNORMAL HIGH (ref 6–20)
CO2: 21 mmol/L — ABNORMAL LOW (ref 22–32)
CO2: 20 mmol/L — ABNORMAL LOW (ref 22–32)
Calcium: 7.7 mg/dL — ABNORMAL LOW (ref 8.9–10.3)
Calcium: 7.7 mg/dL — ABNORMAL LOW (ref 8.9–10.3)
Chloride: 107 mmol/L (ref 98–111)
Chloride: 108 mmol/L (ref 98–111)
Creatinine, Ser: 1.87 mg/dL — ABNORMAL HIGH (ref 0.44–1.00)
Creatinine, Ser: 1.65 mg/dL — ABNORMAL HIGH (ref 0.44–1.00)
GFR calc Af Amer: 34 mL/min — ABNORMAL LOW (ref >60)
GFR calc Af Amer: 40 mL/min — ABNORMAL LOW (ref >60)
GFR calc non Af Amer: 30 mL/min — ABNORMAL LOW (ref >60)
GFR calc non Af Amer: 35 mL/min — ABNORMAL LOW (ref >60)
Glucose, Bld: 191 mg/dL — ABNORMAL HIGH (ref 70–99)
Glucose, Bld: 224 mg/dL — ABNORMAL HIGH (ref 70–99)
Potassium: 5.6 mmol/L — ABNORMAL HIGH (ref 3.5–5.1)
Potassium: 4.5 mmol/L (ref 3.5–5.1)
Sodium: 138 mmol/L (ref 135–145)
Sodium: 137 mmol/L (ref 135–145)

## 2020-03-19 LAB — CBC
HCT: 34.6 % — ABNORMAL LOW (ref 36.0–46.0)
Hemoglobin: 9.6 g/dL — ABNORMAL LOW (ref 12.0–15.0)
MCH: 25.5 pg — ABNORMAL LOW (ref 26.0–34.0)
MCHC: 27.7 g/dL — ABNORMAL LOW (ref 30.0–36.0)
MCV: 92 fL (ref 80.0–100.0)
Platelets: 151 10*3/uL (ref 150–400)
RBC: 3.76 MIL/uL — ABNORMAL LOW (ref 3.87–5.11)
RDW: 20 % — ABNORMAL HIGH (ref 11.5–15.5)
WBC: 14.4 10*3/uL — ABNORMAL HIGH (ref 4.0–10.5)
nRBC: 0 % (ref 0.0–0.2)

## 2020-03-19 LAB — PROCALCITONIN: Procalcitonin: 23.11 ng/mL

## 2020-03-19 LAB — GLUCOSE, CAPILLARY
Glucose-Capillary: 154 mg/dL — ABNORMAL HIGH (ref 70–99)
Glucose-Capillary: 241 mg/dL — ABNORMAL HIGH (ref 70–99)
Glucose-Capillary: 354 mg/dL — ABNORMAL HIGH (ref 70–99)
Glucose-Capillary: 432 mg/dL — ABNORMAL HIGH (ref 70–99)

## 2020-03-19 LAB — POTASSIUM: Potassium: 4.5 mmol/L (ref 3.5–5.1)

## 2020-03-19 MED ORDER — SODIUM POLYSTYRENE SULFONATE 15 GM/60ML PO SUSP
30.0000 g | Freq: Once | ORAL | Status: DC
  Filled 2020-03-19: qty 120

## 2020-03-19 MED ORDER — INSULIN GLARGINE 100 UNIT/ML ~~LOC~~ SOLN
10.0000 [IU] | Freq: Every day | SUBCUTANEOUS | Status: DC
  Administered 2020-03-19 – 2020-03-26 (×8): 10 [IU] via SUBCUTANEOUS
  Filled 2020-03-19 (×8): qty 0.1

## 2020-03-19 MED ORDER — ACETAMINOPHEN 325 MG PO TABS
650.0000 mg | ORAL_TABLET | ORAL | Status: DC | PRN
  Administered 2020-03-19 – 2020-03-25 (×5): 650 mg via ORAL
  Filled 2020-03-19 (×6): qty 2

## 2020-03-19 MED ORDER — BUDESONIDE 0.5 MG/2ML IN SUSP
0.5000 mg | Freq: Two times a day (BID) | RESPIRATORY_TRACT | Status: DC
  Administered 2020-03-19 – 2020-03-26 (×14): 0.5 mg via RESPIRATORY_TRACT
  Filled 2020-03-19 (×15): qty 2

## 2020-03-19 MED ORDER — OXYCODONE HCL 5 MG PO TABS
5.0000 mg | ORAL_TABLET | Freq: Four times a day (QID) | ORAL | Status: DC | PRN
  Administered 2020-03-19 – 2020-03-20 (×3): 5 mg via ORAL
  Filled 2020-03-19 (×4): qty 1

## 2020-03-19 MED ORDER — METHYLPREDNISOLONE SODIUM SUCC 125 MG IJ SOLR
80.0000 mg | Freq: Once | INTRAMUSCULAR | Status: AC
  Administered 2020-03-19: 80 mg via INTRAVENOUS
  Filled 2020-03-19: qty 2

## 2020-03-19 MED ORDER — FLUTICASONE PROPIONATE 50 MCG/ACT NA SUSP
2.0000 | Freq: Every day | NASAL | Status: DC
  Administered 2020-03-19 – 2020-03-26 (×7): 2 via NASAL
  Filled 2020-03-19: qty 16

## 2020-03-19 MED ORDER — SODIUM CHLORIDE 0.9 % IV SOLN: INTRAVENOUS | Status: DC

## 2020-03-19 MED ORDER — SODIUM CHLORIDE 0.9 % IV BOLUS
1000.0000 mL | Freq: Once | INTRAVENOUS | Status: AC
  Administered 2020-03-19: 1000 mL via INTRAVENOUS

## 2020-03-19 MED ORDER — IPRATROPIUM-ALBUTEROL 0.5-2.5 (3) MG/3ML IN SOLN
3.0000 mL | Freq: Three times a day (TID) | RESPIRATORY_TRACT | Status: DC
  Administered 2020-03-19 (×2): 3 mL via RESPIRATORY_TRACT
  Filled 2020-03-19 (×2): qty 3

## 2020-03-19 MED ORDER — HYDROXYZINE HCL 25 MG PO TABS
25.0000 mg | ORAL_TABLET | Freq: Three times a day (TID) | ORAL | Status: DC | PRN
  Filled 2020-03-19: qty 1

## 2020-03-19 MED ORDER — LORATADINE 10 MG PO TABS
10.0000 mg | ORAL_TABLET | Freq: Every day | ORAL | Status: DC
  Administered 2020-03-19 – 2020-03-20 (×2): 10 mg via ORAL
  Filled 2020-03-19 (×2): qty 1

## 2020-03-19 MED ORDER — SODIUM ZIRCONIUM CYCLOSILICATE 10 G PO PACK
10.0000 g | PACK | Freq: Once | ORAL | Status: DC
  Filled 2020-03-19: qty 1

## 2020-03-19 NOTE — Progress Notes (Signed)
Patient takes PRN at home and does not feel she needs scheduled nebs; states nebs give her headaches. O2 sats on 1 L 97-98%. [place] taken off and at bedside if needed. No distress noted.

## 2020-03-19 NOTE — Progress Notes (Signed)
1 Day Post-Op Subjective: Patient reports feeling better. Wants foley out. Sitting in chair..   Objective: Vital signs in last 24 hours: Temp:  [97.5 F (36.4 C)-98.5 F (36.9 C)] 97.5 F (36.4 C) (08/10 0800) Pulse Rate:  [61-87] 80 (08/10 1100) Resp:  [5-20] 18 (08/10 1100) BP: (81-145)/(33-129) 103/56 (08/10 1100) SpO2:  [81 %-100 %] 95 % (08/10 1100) Weight:  [145.2 kg] 145.2 kg (08/10 0500)  Intake/Output from previous day: 08/09 0701 - 08/10 0700 In: 7547.1 [I.V.:5578.6; IV Piggyback:1968.5] Out: 600 [Urine:600] Intake/Output this shift: No intake/output data recorded.  Physical Exam:  Looks well  Alert and oriented  Sitting in chair Urine clear   Lab Results: Recent Labs    03/18/20 0801 03/19/20 0258  HGB 12.1 9.6*  HCT 40.1 34.6*   BMET Recent Labs    03/19/20 0258 03/19/20 0258 03/19/20 0952 03/19/20 1201  NA 138  --   --  137  K 5.6*   < > 4.5 4.5  CL 107  --   --  108  CO2 21*  --   --  20*  GLUCOSE 191*  --   --  224*  BUN 28*  --   --  33*  CREATININE 1.87*  --   --  1.65*  CALCIUM 7.7*  --   --  7.7*   < > = values in this interval not displayed.   No results for input(s): LABPT, INR in the last 72 hours. No results for input(s): LABURIN in the last 72 hours. Results for orders placed or performed during the hospital encounter of 03/18/20  SARS Coronavirus 2 by RT PCR (hospital order, performed in [facility] hospital lab) Nasopharyngeal Nasopharyngeal Swab     Status: None   Collection Time: 03/18/20  9:02 AM   Specimen: Nasopharyngeal Swab  Result Value Ref Range Status   SARS Coronavirus 2 NEGATIVE NEGATIVE Final    Comment: (NOTE) SARS-CoV-2 target nucleic acids are NOT DETECTED.  The SARS-CoV-2 RNA is generally detectable in upper and lower respiratory specimens during the acute phase of infection. The lowest concentration of SARS-CoV-2 viral copies this assay can detect is 250 copies / mL. A negative result does not preclude  SARS-CoV-2 infection and should not be used as the sole basis for treatment or other patient management decisions.  A negative result may occur with improper specimen collection / handling, submission of specimen other than nasopharyngeal swab, presence of viral mutation(s) within the areas targeted by this assay, and inadequate number of viral copies (<250 copies / mL). A negative result must be combined with clinical observations, patient history, and epidemiological information.  Fact Sheet for Patients:   StrictlyIdeas.no  Fact Sheet for Healthcare Providers: BankingDealers.co.za  This test is not yet approved or  cleared by the [place] FDA and has been authorized for detection and/or diagnosis of SARS-CoV-2 by FDA under an Emergency Use Authorization (EUA).  This EUA will remain in effect (meaning this test can be used) for the duration of the COVID-19 declaration under Section 564(b)(1) of the Act, 21 U.S.C. section 360bbb-3(b)(1), unless the authorization is terminated or revoked sooner.  Performed at [facility], [place] [address] [place], [place] [address]   Blood Culture (routine x 2)     Status: None (Preliminary result)   Collection Time: 03/18/20  9:10 AM   Specimen: BLOOD  Result Value Ref Range Status   Specimen Description   Final    BLOOD LEFT ANTECUBITAL Performed  at [facility], [place] [address] [place], [place] [address]    Special Requests   Final    BLOOD Blood Culture adequate volume Performed at [place] [address], [place], [place] [address]    Culture   Final    NO GROWTH 1 DAY Performed at [facility], [place] [address] [place], [place] [address]    Report Status PENDING  Incomplete  Urine culture     Status: Abnormal (Preliminary result)   Collection Time: 03/18/20  9:57 AM   Specimen: In/Out Cath Urine  Result Value Ref  Range Status   Specimen Description   Final    IN/OUT CATH URINE Performed at [place] [address] [place], [place] [address]    Special Requests   Final    NONE Performed at [facility], [place] [address] [place], [place] [address]    Culture (A)  Final    >=100,000 COLONIES/mL ESCHERICHIA COLI SUSCEPTIBILITIES TO FOLLOW Performed at [facility], [place] [address] [place], [place] [address]    Report Status PENDING  Incomplete  MRSA PCR Screening     Status: None   Collection Time: 03/18/20  4:56 PM   Specimen: Nasal Mucosa; Nasopharyngeal  Result Value Ref Range Status   MRSA by PCR NEGATIVE NEGATIVE Final    Comment:        The GeneXpert MRSA Assay (FDA approved for NASAL specimens only), is one component of a comprehensive MRSA colonization surveillance program. It is not intended to diagnose MRSA infection nor to guide or monitor treatment for MRSA infections. Performed at [facility], [place] [address], [place],  [address]     Studies/Results: DG Chest 2 View  Result Date: 03/18/2020 CLINICAL DATA:  Left-sided pain, initial encounter EXAM: CHEST - 2 VIEW COMPARISON:  10/20/2017 FINDINGS: Cardiac shadow is enlarged but accentuated by the frontal technique. The lungs are well aerated bilaterally. Mild central vascular congestion is seen. No focal infiltrate or effusion is noted. Degenerative changes of the thoracic spine are seen. IMPRESSION: Central vascular congestion without other acute abnormality. Electronically Signed   By: Inez Catalina M.D.   On: 03/18/2020 09:01   DG C-Arm 1-60 Min-No Report  Result Date: 03/18/2020 Fluoroscopy was utilized by the requesting physician.  No radiographic interpretation.   CT Renal Stone Study  Result Date: 03/18/2020 CLINICAL DATA:  Left lower abdominal pain that radiates into the left flank. Fever. EXAM: CT ABDOMEN AND PELVIS WITHOUT CONTRAST TECHNIQUE:  Multidetector CT imaging of the abdomen and pelvis was performed following the standard protocol without IV contrast. COMPARISON:  01/26/2020 FINDINGS: Lower chest: Unremarkable. Hepatobiliary: The liver shows diffusely decreased attenuation suggesting fat deposition. 25.8 cm craniocaudal length of the liver is compatible with hepatomegaly. No focal abnormality in the liver on this study without intravenous contrast. Gallbladder is surgically absent. No intrahepatic or extrahepatic biliary dilation. Pancreas: No focal mass lesion. No dilatation of the main duct. No intraparenchymal cyst. No peripancreatic edema. Spleen: No splenomegaly. No focal mass lesion. Adrenals/Urinary Tract: No adrenal nodule or mass. Cortical scarring again noted upper pole right kidney with stable exophytic 1.6 x 1.2 cm lesion stable since prior study and also when comparing back to an exam from 11/20/2010. Cluster of stones and/or staghorn calculus in the lower pole left kidney is similar. There is mild left hydroureteronephrosis on today's exam secondary to a 3 x 3 x 5 mm stone in  the distal left ureter immediately proximal to the left UVJ (axial 76/2). Bladder is decompressed. Stomach/Bowel: Stomach is unremarkable. No gastric wall thickening. No evidence of outlet obstruction. Duodenum is normally positioned as is the ligament of Treitz. No small bowel wall thickening. No small bowel dilatation. The terminal ileum is normal. The appendix is normal. No gross colonic mass. No colonic wall thickening. Vascular/Lymphatic: There is abdominal aortic atherosclerosis without aneurysm. There is no gastrohepatic or hepatoduodenal ligament lymphadenopathy. No retroperitoneal or mesenteric lymphadenopathy. No pelvic sidewall lymphadenopathy. Reproductive: The uterus is surgically absent. There is no adnexal mass. Other: No intraperitoneal free fluid. Musculoskeletal: Marked laxity of the ventral fascia noted, with prominent pannus. Similar  appearance of umbilical hernia containing only fat. No worrisome lytic or sclerotic osseous abnormality. IMPRESSION: 1. 3 x 3 x 5 mm distal left ureteral stone with mild left hydroureteronephrosis. 2. Cluster of stones and/or staghorn calculus in the lower pole left kidney, similar to prior. 3. Hepatomegaly with hepatic steatosis. 4. Stable exophytic lesion upper pole right kidney since 2012, likely a cyst complicated by proteinaceous debris or hemorrhage. 5. Aortic Atherosclerosis (ICD10-I70.0). Electronically Signed   By: Misty [place] M.D.   On: 03/18/2020 10:29    Assessment/Plan:  sepsis - improving - cx growing GNR   Left renal and ureteral stones s/p urgent left stent 8/9/20201 - d/c foley. I discussed with the patient and her husband (he was on the phone) the nature, potential benefits, risks and alternatives to left ureteroscopy, holmium laser lithotripsy and stent exchange, including side effects of the proposed treatment, the likelihood of the patient achieving the goals of the procedure, and any potential problems that might occur during the procedure or recuperation. I will set this up for outpatient in 2-3 weeks.   Appreciate excellent hospitalist care.    LOS: 1 day   Festus Aloe 03/19/2020, 12:56 PM

## 2020-03-19 NOTE — Care Plan (Signed)
Pt refused placement of NGT.

## 2020-03-19 NOTE — Progress Notes (Signed)
[facility] Progress Note Patient Name: Gina [name] DOB: 12/23/1965 MRN: [id number]   Date of Service  03/19/2020  HPI/Events of Note  Hyperkalemia - K+ = 5.6. Patient refuses NGT placement.   eICU Interventions  Plan: 1. D/C NGT. 2. D/C Lokelma per tube. 3. Kayexalate 30 gm PR now.      Intervention Category Major Interventions: Electrolyte abnormality - evaluation and management  Gina [name] [name] 03/19/2020, 5:46 AM

## 2020-03-19 NOTE — Anesthesia Postprocedure Evaluation (Signed)
Anesthesia Post Note  Patient: Gina [name]  Procedure(s) Performed: CYSTOSCOPY/LEFT RETROGRADE PYELOGRAM, STENT PLACEMENT (Left )     Patient location during evaluation: PACU Anesthesia Type: General Level of consciousness: awake and alert Pain management: pain level controlled Vital Signs Assessment: post-procedure vital signs reviewed and stable Respiratory status: spontaneous breathing, nonlabored ventilation and respiratory function stable Cardiovascular status: blood pressure returned to baseline and stable Postop Assessment: no apparent nausea or vomiting Anesthetic complications: no   No complications documented.  Last Vitals:  Vitals:   03/19/20 0600 03/19/20 0700  BP: 101/74 (!) 102/56  Pulse: 66 69  Resp: 17 15  Temp:    SpO2: 99% 99%    Last Pain:  Vitals:   03/19/20 0407  TempSrc:   PainSc: Asleep   Pain Goal: Patients Stated Pain Goal: 0 (03/18/20 1700)                 Lynda Rainwater

## 2020-03-19 NOTE — Progress Notes (Signed)
PROGRESS NOTE    Gina [name]  VZD:[id number] DOB: September 20, 1965 DOA: 03/18/2020 PCP: Elenore Paddy, NP    Chief Complaint  Patient presents with  . Abdominal Pain  . Fever  . Dysuria    Brief Narrative:  54 year old female who presented to the ED on 8/9 w/ cc: sharp left flank pain. In ER was febrile, tachycardic, met SIRS/sepsis criteria. Initially was normotensive but became hypotensive in the ER in spite of IV crystalloid resuscitation. CT imaging showed left obstructing 48m stone w/ associated hydro. Urology was called. The patient went to the OR where she underwent cystoscopy and Left JJ stent. PCCM asked to admit. Patient initially required pressors which have subsequently been discontinued.  Patient transferred to hospitalist service.   Assessment & Plan:   Principal Problem:   Severe sepsis with septic shock (HCC) Active Problems:   Ureteral stone with hydronephrosis   Hyperlipidemia   Type 2 diabetes mellitus with diabetic neuropathy, unspecified (HCC)   Hypertension   Chronic back pain   GERD (gastroesophageal reflux disease)   Morbid obesity with BMI of 50.0-59.9, adult (HCC)   Chronic pain disorder   Peripheral neuropathy   Iron deficiency anemia   Sepsis with acute renal failure (HCC)   Hyperkalemia   Acute pyelonephritis   Tobacco abuse   Wheezing  1 severe sepsis with septic shock secondary to obstructing 5 mm left distal ureteral calculus/acute pyelonephritis/left mild hydroureteronephrosis Patient presenting with left flank pain, abdominal pain, meeting criteria for severe sepsis with hypotension despite fluid resuscitation requiring pressors, and acute renal failure, leukocytosis,, tachycardia, lactic acidosis.  Patient seen in consultation by urology and underwent cystoscopy with left JJ stent placement.  Op note describing grossly purulent urine seen draining from left ureteral orifice following wire manipulation and stent placement.  Urinalysis  concerning for UTI.  Urine cultures pending.  Change LR to normal saline as patient with hyperkalemia.  Blood pressure systolic borderline.  Off pressors.  Continue IV Rocephin.  Urology following.  2.  Acute renal failure with lactic acidosis Secondary to problem #1 in the setting of ACE inhibitor and NSAIDs.  Urinalysis large leukocytes, nitrite positive, greater than 300 protein, greater than 50 WBCs.  Patient seen by urology status post cystoscopy with left JJ stent placement.  Urine output recorded was 600 cc over the past 24 hours.  Creatinine slowly trending down with hydration.  Change LR to normal saline at 125 cc/h.  Avoid nephrotoxins.  Will likely not resume ACE inhibitor for at least 4 weeks.  Follow.  3.  Hyperkalemia Likely secondary to problem #2.  Patient also noted to be on LR.  Discontinue LR and placed on normal saline.  Repeat potassium levels.  Patient noted to have refused Kayexalate overnight as she was told she needed to lay on her left side which is the side with acute pyelonephritis and was unable to.  NG tube was to be placed however patient refused.  If repeat labs consistent with hyperkalemia we will give oral Kayexalate versus Lokelma.  Follow.  4.  Diabetes mellitus type 2 with neuropathy Last hemoglobin A1c 5.8 (03/18/2020).  Patient stated was on insulin before in the past however has been taken off insulin and only on oral hypoglycemic agents of Metformin and Ozempic.  Placed on a carb modified diet.  Hold oral hypoglycemic agents.  Sliding scale insulin.  Continue Lyrica.  Follow.   5.  Hyperlipidemia Continue statin.  6.  Wheezing/tobacco abuse. Patient with ongoing  tobacco abuse.  Tobacco cessation stressed to patient.  Place on Pulmicort and duo nebs, Claritin, Flonase.  We will give a dose of Solu-Medrol 80 mg IV x1.  Nicotine patch offered to patient however patient politely refused..  Supportive care.  7.  Gastroesophageal reflux disease PPI.  8.   Hypertension Patient presented with septic shock.  Blood pressure borderline.  Continue to hold oral antihypertensive medications.  9.  Hyponatremia Likely secondary to hypovolemic hyponatremia as patient had presented with severe sepsis and septic shock.  Improved with hydration.  Follow.  10.  Morbid obesity    DVT prophylaxis: Heparin Code Status: Full Family Communication: Updated patient and husband at bedside. Disposition:   Status is: Inpatient    Dispo: The patient is from: Home              Anticipated d/c is to: Home              Anticipated d/c date is: 3 to 4 days.              Patient currently in ICU, soft blood pressure, on IV antibiotics, status post urological procedure.  Not stable for discharge.       Consultants:   PCCM admission  Urology: Dr. Lovena Neighbours 03/18/2020  Procedures:   CT renal stone protocol 03/18/2020.  Chest x-ray 03/18/2020  Cystoscopy with left JJ stent placement, left retrograde pyelogram with intraoperative interpretation of fluoroscopic imaging per Dr. Lovena Neighbours 03/18/2020  Antimicrobials:   IV Rocephin 03/18/2020>>>>>   Subjective: Patient in room alert.  Stated refused Kayexalate this morning as she was told to lay on her left side and could not lay on her left side but willing to lay on her right side.  Denies any chest pain.  Denies any significant shortness of breath.  Complains of left sided abdominal pain.  Asking for diet.  Off pressors.  Objective: Vitals:   03/19/20 0600 03/19/20 0700 03/19/20 0800 03/19/20 0918  BP: 101/74 (!) 102/56 106/61   Pulse: 66 69 68   Resp: _0 Temp:   (!) 97.5 F (36.4 C)   TempSrc:   Oral   SpO2: 99% 99% 99% 97%  Weight:      Height:        Intake/Output Summary (Last 24 hours) at 03/19/2020 0934 Last data filed at 03/19/2020 0700 Gross per 24 hour  Intake 7547.07 ml  Output 600 ml  Net 6947.07 ml   Filed Weights   03/18/20 0757 03/18/20 1144 03/19/20 0500  Weight: 136.1 kg 136.1  kg (!) 145.2 kg    Examination:  General exam: Appears calm and comfortable  Respiratory system: Decreased breath sounds in the bases.  Some diffuse minimal to mild expiratory wheezing.  No crackles.  No rhonchi.  Speaking in full sentences.  Normal respiratory effort.  Cardiovascular system: S1 & S2 heard, RRR. No JVD, murmurs, rubs, gallops or clicks. No pedal edema. Gastrointestinal system: Abdomen is nondistended, soft and tender to palpation in the left flank, lower abdominal region.  Positive bowel sounds.  No rebound.  No guarding.  Central nervous system: Alert and oriented. No focal neurological deficits. Extremities: Symmetric 5 x 5 power. Skin: No rashes, lesions or ulcers Psychiatry: Judgement and insight appear normal. Mood & affect appropriate.     Data Reviewed: I have personally reviewed following labs and imaging studies  CBC: Recent Labs  Lab 03/18/20 0801 03/19/20 0258  WBC 13.8* 14.4*  NEUTROABS 13.0*  --  HGB 12.1 9.6*  HCT 40.1 34.6*  MCV 84.6 92.0  PLT 167 176    Basic Metabolic Panel: Recent Labs  Lab 03/18/20 0801 03/18/20 1400 03/19/20 0258  NA 129* 137 138  K 3.8 4.6 5.6*  CL 98 108 107  CO2 19* 21* 21*  GLUCOSE 191* 202* 191*  BUN 27* 26* 28*  CREATININE 1.87* 2.00* 1.87*  CALCIUM 8.3* 7.6* 7.7*  MG  --  1.3*  --     GFR: Estimated Creatinine Clearance: 48 mL/min (A) (by C-G formula based on SCr of 1.87 mg/dL (H)).  Liver Function Tests: Recent Labs  Lab 03/18/20 0801 03/18/20 1400  AST 21 27  ALT 19 21  ALKPHOS 76 61  BILITOT 0.8 0.9  PROT 7.0 5.9*  ALBUMIN 3.5 2.9*    CBG: Recent Labs  Lab 03/18/20 1324 03/18/20 1620 03/18/20 1706 03/18/20 2146 03/19/20 0759  GLUCAP 170* 208* 213* 244* 154*     Recent Results (from the past 240 hour(s))  SARS Coronavirus 2 by RT PCR (hospital order, performed in [facility] hospital lab) Nasopharyngeal Nasopharyngeal Swab     Status: None   Collection Time: 03/18/20  9:02 AM     Specimen: Nasopharyngeal Swab  Result Value Ref Range Status   SARS Coronavirus 2 NEGATIVE NEGATIVE Final    Comment: (NOTE) SARS-CoV-2 target nucleic acids are NOT DETECTED.  The SARS-CoV-2 RNA is generally detectable in upper and lower respiratory specimens during the acute phase of infection. The lowest concentration of SARS-CoV-2 viral copies this assay can detect is 250 copies / mL. A negative result does not preclude SARS-CoV-2 infection and should not be used as the sole basis for treatment or other patient management decisions.  A negative result may occur with improper specimen collection / handling, submission of specimen other than nasopharyngeal swab, presence of viral mutation(s) within the areas targeted by this assay, and inadequate number of viral copies (<250 copies / mL). A negative result must be combined with clinical observations, patient history, and epidemiological information.  Fact Sheet for Patients:   StrictlyIdeas.no  Fact Sheet for Healthcare Providers: BankingDealers.co.za  This test is not yet approved or  cleared by the [place] FDA and has been authorized for detection and/or diagnosis of SARS-CoV-2 by FDA under an Emergency Use Authorization (EUA).  This EUA will remain in effect (meaning this test can be used) for the duration of the COVID-19 declaration under Section 564(b)(1) of the Act, 21 U.S.C. section 360bbb-3(b)(1), unless the authorization is terminated or revoked sooner.  Performed at [facility], [place] [address], [place], [place] [address]   Urine culture     Status: Abnormal (Preliminary result)   Collection Time: 03/18/20  9:57 AM   Specimen: In/Out Cath Urine  Result Value Ref Range Status   Specimen Description   Final    IN/OUT CATH URINE Performed at [place] [address] [place], [place] [address]    Special Requests   Final     NONE Performed at [facility], [place] [address] [place], [place] [address]    Culture (A)  Final    >=100,000 COLONIES/mL GRAM NEGATIVE RODS SUSCEPTIBILITIES TO FOLLOW Performed at [facility], [place] [address], [place], [place] [address]    Report Status PENDING  Incomplete  MRSA PCR Screening     Status: None   Collection Time: 03/18/20  4:56 PM   Specimen: Nasal Mucosa; Nasopharyngeal  Result Value  Ref Range Status   MRSA by PCR NEGATIVE NEGATIVE Final    Comment:        The GeneXpert MRSA Assay (FDA approved for NASAL specimens only), is one component of a comprehensive MRSA colonization surveillance program. It is not intended to diagnose MRSA infection nor to guide or monitor treatment for MRSA infections. Performed at [facility], [place] [address] [place], [place] [address]          Radiology Studies: DG Chest 2 View  Result Date: 03/18/2020 CLINICAL DATA:  Left-sided pain, initial encounter EXAM: CHEST - 2 VIEW COMPARISON:  10/20/2017 FINDINGS: Cardiac shadow is enlarged but accentuated by the frontal technique. The lungs are well aerated bilaterally. Mild central vascular congestion is seen. No focal infiltrate or effusion is noted. Degenerative changes of the thoracic spine are seen. IMPRESSION: Central vascular congestion without other acute abnormality. Electronically Signed   By: Inez Catalina M.D.   On: 03/18/2020 09:01   DG C-Arm 1-60 Min-No Report  Result Date: 03/18/2020 Fluoroscopy was utilized by the requesting physician.  No radiographic interpretation.   CT Renal Stone Study  Result Date: 03/18/2020 CLINICAL DATA:  Left lower abdominal pain that radiates into the left flank. Fever. EXAM: CT ABDOMEN AND PELVIS WITHOUT CONTRAST TECHNIQUE: Multidetector CT imaging of the abdomen and pelvis was performed following the standard protocol without IV contrast. COMPARISON:  01/26/2020 FINDINGS: Lower chest:  Unremarkable. Hepatobiliary: The liver shows diffusely decreased attenuation suggesting fat deposition. 25.8 cm craniocaudal length of the liver is compatible with hepatomegaly. No focal abnormality in the liver on this study without intravenous contrast. Gallbladder is surgically absent. No intrahepatic or extrahepatic biliary dilation. Pancreas: No focal mass lesion. No dilatation of the main duct. No intraparenchymal cyst. No peripancreatic edema. Spleen: No splenomegaly. No focal mass lesion. Adrenals/Urinary Tract: No adrenal nodule or mass. Cortical scarring again noted upper pole right kidney with stable exophytic 1.6 x 1.2 cm lesion stable since prior study and also when comparing back to an exam from 11/20/2010. Cluster of stones and/or staghorn calculus in the lower pole left kidney is similar. There is mild left hydroureteronephrosis on today's exam secondary to a 3 x 3 x 5 mm stone in the distal left ureter immediately proximal to the left UVJ (axial 76/2). Bladder is decompressed. Stomach/Bowel: Stomach is unremarkable. No gastric wall thickening. No evidence of outlet obstruction. Duodenum is normally positioned as is the ligament of Treitz. No small bowel wall thickening. No small bowel dilatation. The terminal ileum is normal. The appendix is normal. No gross colonic mass. No colonic wall thickening. Vascular/Lymphatic: There is abdominal aortic atherosclerosis without aneurysm. There is no gastrohepatic or hepatoduodenal ligament lymphadenopathy. No retroperitoneal or mesenteric lymphadenopathy. No pelvic sidewall lymphadenopathy. Reproductive: The uterus is surgically absent. There is no adnexal mass. Other: No intraperitoneal free fluid. Musculoskeletal: Marked laxity of the ventral fascia noted, with prominent pannus. Similar appearance of umbilical hernia containing only fat. No worrisome lytic or sclerotic osseous abnormality. IMPRESSION: 1. 3 x 3 x 5 mm distal left ureteral stone with mild  left hydroureteronephrosis. 2. Cluster of stones and/or staghorn calculus in the lower pole left kidney, similar to prior. 3. Hepatomegaly with hepatic steatosis. 4. Stable exophytic lesion upper pole right kidney since 2012, likely a cyst complicated by proteinaceous debris or hemorrhage. 5. Aortic Atherosclerosis (ICD10-I70.0). Electronically Signed   By: Misty Stanley M.D.   On: 03/18/2020 10:29        Scheduled Meds: . atorvastatin  40 mg Oral Daily  . budesonide (PULMICORT) nebulizer solution  0.5 mg Nebulization BID  . Chlorhexidine Gluconate Cloth  6 each Topical Daily  . fluticasone  2 spray Each Nare Daily  . heparin  5,000 Units Subcutaneous Q8H  . insulin aspart  0-20 Units Subcutaneous TID WC  . insulin aspart  0-5 Units Subcutaneous QHS  . ipratropium-albuterol  3 mL Nebulization TID  . loratadine  10 mg Oral Daily  . mouth rinse  15 mL Mouth Rinse BID  . pantoprazole (PROTONIX) IV  40 mg Intravenous QHS  . pregabalin  200 mg Oral TID  . sodium chloride flush  3 mL Intravenous Once  . sodium polystyrene  30 g Rectal Once   Continuous Infusions: . sodium chloride 125 mL/hr at 03/19/20 0931  . cefTRIAXone (ROCEPHIN)  IV Stopped (03/18/20 1815)     LOS: 1 day    Time spent: 45 minutes    Irine Seal, MD [facility]   To contact the attending provider between 7A-7P or the covering provider during after hours 7P-7A, please log into the web site [email or web address] and access using universal [place] password for that web site. If you do not have the password, please call the hospital operator.  03/19/2020, 9:34 AM

## 2020-03-19 NOTE — TOC Initial Note (Signed)
Transition of Care [facility]) - Initial/Assessment Note    Patient Details  Name: Gina [name] MRN: [id number] Date of Birth: September 30, 1965  Transition of [facility]) CM/SW Contact:    Leeroy Cha, RN Phone Number: 03/19/2020, 9:44 AM  Clinical Narrative:                 Septic shock urine culture growing GNR,  WCB 214.4, K+ 5.6 rec'd kayexlate, Iv rocephin, iv ns at 125cc/hr,bun 28, creat 1.87. From home has pcp plan is to return to home.  Expected Discharge Plan: Home/Self Care Barriers to Discharge: Continued Medical Work up   Patient Goals and CMS Choice Patient states their goals for this hospitalization and ongoing recovery are:: to go home once I am better CMS Medicare.gov Compare Post Acute Care list provided to:: Patient    Expected Discharge Plan and Services Expected Discharge Plan: Home/Self Care   Discharge Planning Services: CM Consult   Living arrangements for the past 2 months: Single Family Home                                      Prior Living Arrangements/Services Living arrangements for the past 2 months: Single Family Home Lives with:: Spouse Patient language and need for interpreter reviewed:: Yes Do you feel safe going back to the place where you live?: Yes      Need for Family Participation in Patient Care: Yes (Comment) Care giver support system in place?: Yes (comment)   Criminal Activity/Legal Involvement Pertinent to Current Situation/Hospitalization: No - Comment as needed  Activities of Daily Living      Permission Sought/Granted                  Emotional Assessment Appearance:: Appears stated age     Orientation: : Oriented to Self, Oriented to Place, Oriented to  Time, Oriented to Situation Alcohol / Substance Use: Not Applicable Psych Involvement: No (comment)  Admission diagnosis:  Ureteral stone with hydronephrosis [N13.2] Sepsis with acute renal failure, due to unspecified organism, unspecified acute  renal failure type, unspecified whether septic shock present ([place]) [A41.9, R65.20, N17.9] Septic shock ([place]) [A41.9, R65.21] Patient Active Problem List   Diagnosis Date Noted  . Hyperkalemia 03/19/2020  . Severe sepsis with septic shock ([place]) 03/19/2020  . Acute pyelonephritis 03/19/2020  . Tobacco abuse 03/19/2020  . Wheezing 03/19/2020  . Sepsis with acute renal failure ([place]) 03/18/2020  . Ureteral stone with hydronephrosis   . Anemia 12/18/2019  . Iron deficiency anemia 12/18/2019  . Peripheral neuropathy 10/25/2017  . Chronic pain disorder 06/25/2017  . Intertrigo 06/25/2017  . Chronic back pain 03/19/2017  . GERD (gastroesophageal reflux disease) 03/19/2017  . Morbid obesity with BMI of 50.0-59.9, adult ([place]) 03/19/2017  . Bacteremia due to group B Streptococcus 02/11/2017  . Volume overload 02/11/2017  . Acute encephalopathy 02/11/2017  . Right renal mass 02/11/2017  . Normocytic anemia 02/11/2017  . Generalized abdominal pain   . Constipation   . Benign neoplasm of rectum   . Nausea and vomiting   . Shortness of breath 06/25/2016  . CAP (community acquired pneumonia) 06/25/2016  . History of recurrent UTIs 06/25/2016  . Hyperlipidemia 06/25/2016  . Type 2 diabetes mellitus with diabetic neuropathy, unspecified ([place]) 06/25/2016  . Hypertension 06/25/2016  . Asthma 06/25/2016  . Polypharmacy 06/25/2016  . Eosinophilia 04/23/2014   PCP:  Elenore Paddy, NP Pharmacy:  [id number] - [place], [place] - [address] [facility] [address] Phone: [phone] Fax: [phone]     Social Determinants of Health (SDOH) Interventions    Readmission Risk Interventions No flowsheet data found.

## 2020-03-19 NOTE — Progress Notes (Signed)
[facility] Progress Note Patient Name: TANYLA STEGE DOB: 1966-05-11 MRN: [id number]   Date of Service  03/19/2020  HPI/Events of Note  Multiple issues: 1. Patient still with nausea after Zofran, 2. K+ = 5.6 and 3. Urine output remains low. EKG without widened QRS or peaked T wave.   eICU Interventions  Plan: 1. NGT to LIS. 2. Lokelma 10 gm per tube (post NGT placement). 3. Repeat BMP at 12 noon.  4. Bolus with 0.9 NaCl 1 liter IV over 1 hour now.      Intervention Category Major Interventions: Other:;Electrolyte abnormality - evaluation and management  Emi Lymon [name] 03/19/2020, 5:15 AM

## 2020-03-20 LAB — URINE CULTURE: Culture: >=100000 — AB

## 2020-03-20 LAB — CBC WITH DIFFERENTIAL/PLATELET
Abs Immature Granulocytes: 0.04 10*3/uL (ref 0.00–0.07)
Basophils Absolute: 0 10*3/uL (ref 0.0–0.1)
Basophils Relative: 0 %
Eosinophils Absolute: 0 10*3/uL (ref 0.0–0.5)
Eosinophils Relative: 0 %
HCT: 34.3 % — ABNORMAL LOW (ref 36.0–46.0)
Hemoglobin: 9.9 g/dL — ABNORMAL LOW (ref 12.0–15.0)
Immature Granulocytes: 1 %
Lymphocytes Relative: 3 %
Lymphs Abs: 0.2 10*3/uL — ABNORMAL LOW (ref 0.7–4.0)
MCH: 25.8 pg — ABNORMAL LOW (ref 26.0–34.0)
MCHC: 28.9 g/dL — ABNORMAL LOW (ref 30.0–36.0)
MCV: 89.6 fL (ref 80.0–100.0)
Monocytes Absolute: 0.2 10*3/uL (ref 0.1–1.0)
Monocytes Relative: 3 %
Neutro Abs: 6.5 10*3/uL (ref 1.7–7.7)
Neutrophils Relative %: 93 %
Platelets: 117 10*3/uL — ABNORMAL LOW (ref 150–400)
RBC: 3.83 MIL/uL — ABNORMAL LOW (ref 3.87–5.11)
RDW: 19.7 % — ABNORMAL HIGH (ref 11.5–15.5)
WBC: 7 10*3/uL (ref 4.0–10.5)
nRBC: 0 % (ref 0.0–0.2)

## 2020-03-20 LAB — RENAL FUNCTION PANEL
Albumin: 3.1 g/dL — ABNORMAL LOW (ref 3.5–5.0)
Anion gap: 7 (ref 5–15)
BUN: 33 mg/dL — ABNORMAL HIGH (ref 6–20)
CO2: 20 mmol/L — ABNORMAL LOW (ref 22–32)
Calcium: 7.8 mg/dL — ABNORMAL LOW (ref 8.9–10.3)
Chloride: 109 mmol/L (ref 98–111)
Creatinine, Ser: 1.52 mg/dL — ABNORMAL HIGH (ref 0.44–1.00)
GFR calc Af Amer: 44 mL/min — ABNORMAL LOW (ref >60)
GFR calc non Af Amer: 38 mL/min — ABNORMAL LOW (ref >60)
Glucose, Bld: 407 mg/dL — ABNORMAL HIGH (ref 70–99)
Phosphorus: 3.2 mg/dL (ref 2.5–4.6)
Potassium: 6.1 mmol/L — ABNORMAL HIGH (ref 3.5–5.1)
Sodium: 136 mmol/L (ref 135–145)

## 2020-03-20 LAB — GLUCOSE, CAPILLARY
Glucose-Capillary: 356 mg/dL — ABNORMAL HIGH (ref 70–99)
Glucose-Capillary: 413 mg/dL — ABNORMAL HIGH (ref 70–99)
Glucose-Capillary: 255 mg/dL — ABNORMAL HIGH (ref 70–99)
Glucose-Capillary: 166 mg/dL — ABNORMAL HIGH (ref 70–99)

## 2020-03-20 LAB — PROCALCITONIN: Procalcitonin: 11.31 ng/mL

## 2020-03-20 MED ORDER — IBUPROFEN 200 MG PO TABS
400.0000 mg | ORAL_TABLET | Freq: Four times a day (QID) | ORAL | Status: DC | PRN
  Administered 2020-03-20: 400 mg via ORAL
  Filled 2020-03-20: qty 2

## 2020-03-20 MED ORDER — ALPRAZOLAM 0.25 MG PO TABS
0.2500 mg | ORAL_TABLET | Freq: Two times a day (BID) | ORAL | Status: DC | PRN
  Administered 2020-03-21: 0.25 mg via ORAL
  Filled 2020-03-20: qty 1

## 2020-03-20 MED ORDER — SODIUM POLYSTYRENE SULFONATE 15 GM/60ML PO SUSP
15.0000 g | Freq: Once | ORAL | Status: AC
  Administered 2020-03-20: 15 g via ORAL
  Filled 2020-03-20: qty 60

## 2020-03-20 MED ORDER — FUROSEMIDE 10 MG/ML IJ SOLN
40.0000 mg | Freq: Once | INTRAMUSCULAR | Status: AC
  Administered 2020-03-20: 40 mg via INTRAVENOUS
  Filled 2020-03-20: qty 4

## 2020-03-20 MED ORDER — PROSOURCE PLUS PO LIQD
30.0000 mL | Freq: Three times a day (TID) | ORAL | Status: DC
  Administered 2020-03-20 – 2020-03-26 (×12): 30 mL via ORAL
  Filled 2020-03-20 (×15): qty 30

## 2020-03-20 MED ORDER — BELLADONNA ALKALOIDS-OPIUM 16.2-60 MG RE SUPP
1.0000 | Freq: Four times a day (QID) | RECTAL | Status: DC | PRN
  Administered 2020-03-21 – 2020-03-22 (×2): 1 via RECTAL
  Filled 2020-03-20 (×2): qty 1

## 2020-03-20 MED ORDER — BUTALBITAL-APAP-CAFFEINE 50-325-40 MG PO TABS
1.0000 | ORAL_TABLET | Freq: Four times a day (QID) | ORAL | Status: DC | PRN
  Administered 2020-03-20: 1 via ORAL
  Filled 2020-03-20: qty 1

## 2020-03-20 MED ORDER — INSULIN ASPART 100 UNIT/ML ~~LOC~~ SOLN
10.0000 [IU] | Freq: Once | SUBCUTANEOUS | Status: AC
  Administered 2020-03-20: 10 [IU] via SUBCUTANEOUS

## 2020-03-20 NOTE — Progress Notes (Signed)
PROGRESS NOTE    [name]  EYC:[id number] DOB: 11/11/1965 DOA: 03/18/2020 PCP: Elenore Paddy, NP  Brief Narrative:  59 white female BMI 56, intermittent asthma, HTN, chronic pain syndrome?  IBS?  Fibromyalgia, IDDM, OSA, prior stage II-III left leg ulcer Prior exophytic right renal mass right kidney found coincidentally last admission 02/15/2017 Multifactorial anemia Prior Streptococcus galactosemia bacteremia 2018 Prior MRSA soft tissue infection of the chin 2006 Prior lysis of adhesions was BAL TSO 2006 Dr. Glennon Mac more Admit 03/18/2020 septic shock AKI obstructive uropathy with left renal calculi status post left double-J stent-found to have obstructing 5 mm UVJ calculus plus hydronephrosis-initially required pressors and was admitted by critical care medicine  Assessment & Plan:   Principal Problem:   Severe sepsis with septic shock ([place]) Active Problems:   Hyperlipidemia   Type 2 diabetes mellitus with diabetic neuropathy, unspecified (HCC)   Hypertension   Chronic back pain   GERD (gastroesophageal reflux disease)   Morbid obesity with BMI of 50.0-59.9, adult (HCC)   Chronic pain disorder   Peripheral neuropathy   Iron deficiency anemia   Sepsis with acute renal failure (HCC)   Ureteral stone with hydronephrosis   Hyperkalemia   Acute pyelonephritis   Tobacco abuse   Wheezing   1. Resolved septic shock secondary to obstructing 5 mm left ureteric calculus with E. coli (sensitivities are pending) a. Will probably be able to narrow antibiotics in the next 24 hours b. For now continue ceftriaxone 2 g every 24 and repeat labs in the a.m. c. Definitive management as per urology  2. AKI with lactic acidosis likely secondary to resolving oliguria a. Acidosis resolving CO2 now 20 b. AKI much better than prior-saline locked as per below 3. Iatrogenic fluid overload?  With hypoxia a. Net +8 L weight up from 136 to 145 kg b. Lasix 40 mg IV x1 now and reassess urine and  output-if urine output is sluggish may need bladder scanning and Foley replacement c. She desatted to the 80s with ambulation but seems to desat when she goes to sleep 4. Left-sided pain a. She may have some strangury and spasm from her stent I have placed her on BNO suppositories b. Urology to comment 5. Severe obesity BMI 56 OHSS a. I have placed an order for BiPAP at night to see if she can tolerate b. She will need a split-night study in the outpatient setting 6. Hyperkalemia a. Tolerating some diet therefore changed rectal to p.o. Kayexalate b. Etiology is likely secondary to AKI on admission c. A.m. labs 7. Fibromyalgia pain/headaches a. Received fentanyl 8/10 PM which has been discontinued-first choice OxyIR every 6 as needed b. For headache relief it looks like she was taking Goody powders at home I have placed her on careful Fioricet dosing every 6 as needed headache-she may take ibuprofen in addition with food and I have discussed this with nursing c. Would like to discontinue Atarax tomorrow but can continue her Xanax at this time 8. IDDM + neuropathy + nephropathy-A1c 5.8 on 03/18/2020 a. Continue Lantus 10 units in addition to sliding scale b. She was given a dose of Solu-Medrol 80 mg which increased her sugar to the 400 range-this is resolving 9. Mild thrombocytopenia a. Secondary to sepsis no further work-up 10. Likely dilutional anemia secondary to resuscitation attempts 11. Ongoing tobacco use 12. Hyperlipidemia 13. Reflux 14. HTN a. ACE inhibitor from admission has been discontinued b. Blood pressure trends of slightly elevated  DVT prophylaxis: Heparin Code  Status: Full Family Communication: Long discussion at the bedside with daughter  Disposition:   Status is: Inpatient  Remains inpatient appropriate because:Hemodynamically unstable, Persistent severe electrolyte disturbances and Unsafe d/c plan   Dispo: The patient is from: Home              Anticipated d/c  is to: Unclear at this time              Anticipated d/c date is: 3 days              Patient currently is not medically stable to d/c.       Consultants:   Urologist  Procedures: Double-J stent placement  Antimicrobials: Ceftriaxone   Subjective: Complaining of side pain on the left side which may be consistent with where she had her double-J stent No fever no chills no nausea no vomiting Cannot get to sleep and feels somnolent  Objective: Vitals:   03/19/20 2000 03/19/20 2359 03/20/20 0000 03/20/20 0400  BP:   130/65 139/62  Pulse:   70   Resp:   14   Temp: 97.8 F (36.6 C) 98.1 F (36.7 C)  97.7 F (36.5 C)  TempSrc: Oral Oral  Oral  SpO2:   99%   Weight:      Height:        Intake/Output Summary (Last 24 hours) at 03/20/2020 0704 Last data filed at 03/19/2020 1811 Gross per 24 hour  Intake 1180.42 ml  Output 650 ml  Net 530.42 ml   Filed Weights   03/18/20 0757 03/18/20 1144 03/19/20 0500  Weight: 136.1 kg 136.1 kg (!) 145.2 kg    Examination: Awake obese pleasant coherent no distress clear seems somewhat sleepy Respiratory system: No rales no rhonchi Cardiovascular system: S1-S2 no murmur rub or gallop Gastrointestinal system: Obese she does have a bandage over her pannus I did not examine below the pannus. Central nervous system: Moves all 4 limbs equally Extremities: None--- has some ichthyosis Skin: Stage I lower extremity edema Psychiatry: Euthymic but sleepy  Data Reviewed: I have personally reviewed following labs and imaging studies Potassium 6.1 Bicarb 20 BUN/creatinine peak of 26/2.0-->33/1.5 currently Procalcitonin peak of 23-->11.3 White count 14.4-->7.0 Platelet count 151-->117 Radiology Studies: DG Chest 2 View  Result Date: 03/18/2020 CLINICAL DATA:  Left-sided pain, initial encounter EXAM: CHEST - 2 VIEW COMPARISON:  10/20/2017 FINDINGS: Cardiac shadow is enlarged but accentuated by the frontal technique. The lungs are well  aerated bilaterally. Mild central vascular congestion is seen. No focal infiltrate or effusion is noted. Degenerative changes of the thoracic spine are seen. IMPRESSION: Central vascular congestion without other acute abnormality. Electronically Signed   By: Inez Catalina M.D.   On: 03/18/2020 09:01   DG CHEST PORT 1 VIEW  Result Date: 03/19/2020 CLINICAL DATA:  Shortness of breath EXAM: PORTABLE CHEST 1 VIEW COMPARISON:  03/18/2020 FINDINGS: There is cardiomegaly with vascular congestion. There is no focal infiltrate. There is no pneumothorax. No large pleural effusion. No acute osseous abnormality. IMPRESSION: Cardiomegaly with vascular congestion. Electronically Signed   By: Constance Holster M.D.   On: 03/19/2020 22:45   DG C-Arm 1-60 Min-No Report  Result Date: 03/18/2020 Fluoroscopy was utilized by the requesting physician.  No radiographic interpretation.   CT Renal Stone Study  Result Date: 03/18/2020 CLINICAL DATA:  Left lower abdominal pain that radiates into the left flank. Fever. EXAM: CT ABDOMEN AND PELVIS WITHOUT CONTRAST TECHNIQUE: Multidetector CT imaging of the abdomen and pelvis was performed following  the standard protocol without IV contrast. COMPARISON:  01/26/2020 FINDINGS: Lower chest: Unremarkable. Hepatobiliary: The liver shows diffusely decreased attenuation suggesting fat deposition. 25.8 cm craniocaudal length of the liver is compatible with hepatomegaly. No focal abnormality in the liver on this study without intravenous contrast. Gallbladder is surgically absent. No intrahepatic or extrahepatic biliary dilation. Pancreas: No focal mass lesion. No dilatation of the main duct. No intraparenchymal cyst. No peripancreatic edema. Spleen: No splenomegaly. No focal mass lesion. Adrenals/Urinary Tract: No adrenal nodule or mass. Cortical scarring again noted upper pole right kidney with stable exophytic 1.6 x 1.2 cm lesion stable since prior study and also when comparing back to an  exam from 11/20/2010. Cluster of stones and/or staghorn calculus in the lower pole left kidney is similar. There is mild left hydroureteronephrosis on today's exam secondary to a 3 x 3 x 5 mm stone in the distal left ureter immediately proximal to the left UVJ (axial 76/2). Bladder is decompressed. Stomach/Bowel: Stomach is unremarkable. No gastric wall thickening. No evidence of outlet obstruction. Duodenum is normally positioned as is the ligament of Treitz. No small bowel wall thickening. No small bowel dilatation. The terminal ileum is normal. The appendix is normal. No gross colonic mass. No colonic wall thickening. Vascular/Lymphatic: There is abdominal aortic atherosclerosis without aneurysm. There is no gastrohepatic or hepatoduodenal ligament lymphadenopathy. No retroperitoneal or mesenteric lymphadenopathy. No pelvic sidewall lymphadenopathy. Reproductive: The uterus is surgically absent. There is no adnexal mass. Other: No intraperitoneal free fluid. Musculoskeletal: Marked laxity of the ventral fascia noted, with prominent pannus. Similar appearance of umbilical hernia containing only fat. No worrisome lytic or sclerotic osseous abnormality. IMPRESSION: 1. 3 x 3 x 5 mm distal left ureteral stone with mild left hydroureteronephrosis. 2. Cluster of stones and/or staghorn calculus in the lower pole left kidney, similar to prior. 3. Hepatomegaly with hepatic steatosis. 4. Stable exophytic lesion upper pole right kidney since 2012, likely a cyst complicated by proteinaceous debris or hemorrhage. 5. Aortic Atherosclerosis (ICD10-I70.0). Electronically Signed   By: Misty Stanley M.D.   On: 03/18/2020 10:29     Scheduled Meds: . atorvastatin  40 mg Oral Daily  . budesonide (PULMICORT) nebulizer solution  0.5 mg Nebulization BID  . Chlorhexidine Gluconate Cloth  6 each Topical Daily  . fluticasone  2 spray Each Nare Daily  . heparin  5,000 Units Subcutaneous Q8H  . insulin aspart  0-20 Units  Subcutaneous TID WC  . insulin aspart  0-5 Units Subcutaneous QHS  . insulin glargine  10 Units Subcutaneous Daily  . loratadine  10 mg Oral Daily  . mouth rinse  15 mL Mouth Rinse BID  . pantoprazole (PROTONIX) IV  40 mg Intravenous QHS  . pregabalin  200 mg Oral TID  . sodium chloride flush  3 mL Intravenous Once  . sodium polystyrene  30 g Rectal Once   Continuous Infusions: . sodium chloride 10 mL/hr at 03/20/20 0630  . cefTRIAXone (ROCEPHIN)  IV Stopped (03/19/20 1807)     LOS: 2 days    Time spent: 77  Nita Sells, MD [facility] To contact the attending provider between 7A-7P or the covering provider during after hours 7P-7A, please log into the web site [email or web address] and access using universal [place] password for that web site. If you do not have the password, please call the hospital operator.  03/20/2020, 7:04 AM

## 2020-03-20 NOTE — Progress Notes (Signed)
Patient c/o that she can't breath spo2  Is 96 % with the CPAP mask on, wants mask off. O2 2L  applied, will monitor SPO2

## 2020-03-20 NOTE — Progress Notes (Signed)
Initial Nutrition Assessment  DOCUMENTATION CODES:   Morbid obesity  INTERVENTION:  - will order 30 mL Prosource Plus TID, each supplement provides 100 kcal and 15 grams of protein.  NUTRITION DIAGNOSIS:   Increased nutrient needs related to acute illness (sepsis) as evidenced by estimated needs.  GOAL:   Patient will meet greater than or equal to 90% of their needs  MONITOR:   PO intake, Supplement acceptance, Labs, Weight trends  REASON FOR ASSESSMENT:   Malnutrition Screening Tool  ASSESSMENT:   54 year-old female with medical history of HTN, chronic pain, fibromyalgia, arthritis, IBS, HLD, asthma, DM, anxiety, GERD, anemia, and headache. She presented to the ED on 8/9 with sharp L flank pain. She was dx with SIRS/sepsis. CT imaging showed L obstructing stone with hydronephrosis. She is now s/p cystoscopy and L JJ stent.  Patient ate 100% of breakfast this AM (452 kcal, 14 grams protein). She reports good appetite today and that she is able to chew and swallow with no issues. No abdominal pain/pressure or nausea associated with eating.   She reports that over the past few weeks she has lost 10 lb d/t worsening abdominal pain which became severe and sharp the date of admission.   Weight today is 325 lb. Weight on 8/6 was 300 lb, weight on 7/27 was 306 lb, and weight on 5/10 was 321 lb. See below concerning fluid gain.   Per notes: - septic shock--resolved - AKI with lactic acidosis 2/2 resolving oliguria - iatrogenic fluid overload--+8L    Labs reviewed; CBG: 356 mg/dl, K: 6.1 mmol/l, BUN: 33 mg/dl, creatinine: 1.52 mg/dl, Ca: 7.8 mg/dl, GFR: 38 ml/min. Medications reviewed; 40 mg IV lasix x1 dose 8/11, sliding scale novolog, 10 units novolog x1 dose 8/11, 10 units lantus/day, 40 mg IV protonix/day.    Diet Order:   Diet Order            Diet Carb Modified Fluid consistency: Thin; Room service appropriate? Yes  Diet effective now                 EDUCATION  NEEDS:   No education needs have been identified at this time  Skin:  Skin Assessment: Skin Integrity Issues: Skin Integrity Issues:: Incisions Incisions: perineum (8/9)  Last BM:  8/8  Height:   Ht Readings from Last 1 Encounters:  03/18/20 5\' 3"  (1.6 m)    Weight:   Wt Readings from Last 1 Encounters:  03/20/20 (!) 147.5 kg    Estimated Nutritional Needs:  Kcal:  2250-2500 kcal Protein:  115-130 grams Fluid:  >/= 2.5 L/day     Jarome Matin, MS, RD, LDN, CNSC Inpatient Clinical Dietitian RD pager # available in AMION  After hours/weekend pager # available in [facility]

## 2020-03-20 NOTE — Progress Notes (Signed)
SATURATION QUALIFICATIONS: (This note is used to comply with regulatory documentation for home oxygen)  Patient Saturations on Room Air at Rest = 90%  Patient Saturations on Room Air while Ambulating = 83%  Patient Saturations on 4iters of oxygen while Ambulating = 88%  Please briefly explain why patient needs home oxygen: Pt has increased work of breathing when do any type of exertion. MD made aware

## 2020-03-20 NOTE — Progress Notes (Signed)
Patient has had complaint of headache since start of shift. Pain medications changed to address this problem. Patient oxygen saturation began dropping while at rest down to the low 80's. Nasal canula at 2L was placed on patient, spo2 increased to 97% but she has continued to complain of shortness of breath. I paused her IV fluids and got an EKG. EKG appears unchanged and on call provider ordered chest xray to rule out fluid volume overload. CBG's have increased significantly to above 400- provider aware. Will continue to assess for change in status.

## 2020-03-20 NOTE — Progress Notes (Signed)
Notified Dr. Verlon Au of patients new confusion, urinary retention and blood sugar level. Orders were given and also contacted Urology about patient's retention who gave v/o to place foley for now. Will continue to monitor

## 2020-03-20 NOTE — Progress Notes (Signed)
2 Days Post-Op Subjective: Patient reports she doesn't like fentanyl but prefers dilaudid.   Objective: Vital signs in last 24 hours: Temp:  [97.5 F (36.4 C)-98.1 F (36.7 C)] 97.5 F (36.4 C) (08/11 1630) Pulse Rate:  [70-83] 71 (08/11 1630) Resp:  [14-18] 17 (08/11 1630) BP: (119-180)/(47-92) 180/92 (08/11 1630) SpO2:  [92 %-100 %] 97 % (08/11 1630) Weight:  [147.5 kg] 147.5 kg (08/11 1030)  Intake/Output from previous day: 08/10 0701 - 08/11 0700 In: 1180.4 [I.V.:1080.4; IV Piggyback:100] Out: 650 [Urine:650] Intake/Output this shift: Total I/O In: 1165.4 [P.O.:580; I.V.:585.4] Out: 2050 [Urine:2050]  Physical Exam:  NAd In bed Abd soft and NT Urine clear in foley tubing   Lab Results: Recent Labs    03/18/20 0801 03/19/20 0258 03/20/20 0138  HGB 12.1 9.6* 9.9*  HCT 40.1 34.6* 34.3*   BMET Recent Labs    03/19/20 1201 03/20/20 0138  NA 137 136  K 4.5 6.1*  CL 108 109  CO2 20* 20*  GLUCOSE 224* 407*  BUN 33* 33*  CREATININE 1.65* 1.52*  CALCIUM 7.7* 7.8*   No results for input(s): LABPT, INR in the last 72 hours. No results for input(s): LABURIN in the last 72 hours. Results for orders placed or performed during the hospital encounter of 03/18/20  SARS Coronavirus 2 by RT PCR (hospital order, performed in [facility] hospital lab) Nasopharyngeal Nasopharyngeal Swab     Status: None   Collection Time: 03/18/20  9:02 AM   Specimen: Nasopharyngeal Swab  Result Value Ref Range Status   SARS Coronavirus 2 NEGATIVE NEGATIVE Final    Comment: (NOTE) SARS-CoV-2 target nucleic acids are NOT DETECTED.  The SARS-CoV-2 RNA is generally detectable in upper and lower respiratory specimens during the acute phase of infection. The lowest concentration of SARS-CoV-2 viral copies this assay can detect is 250 copies / mL. A negative result does not preclude SARS-CoV-2 infection and should not be used as the sole basis for treatment or other patient management  decisions.  A negative result may occur with improper specimen collection / handling, submission of specimen other than nasopharyngeal swab, presence of viral mutation(s) within the areas targeted by this assay, and inadequate number of viral copies (<250 copies / mL). A negative result must be combined with clinical observations, patient history, and epidemiological information.  Fact Sheet for Patients:   StrictlyIdeas.no  Fact Sheet for Healthcare Providers: BankingDealers.co.za  This test is not yet approved or  cleared by the [place] FDA and has been authorized for detection and/or diagnosis of SARS-CoV-2 by FDA under an Emergency Use Authorization (EUA).  This EUA will remain in effect (meaning this test can be used) for the duration of the COVID-19 declaration under Section 564(b)(1) of the Act, 21 U.S.C. section 360bbb-3(b)(1), unless the authorization is terminated or revoked sooner.  Performed at [facility], [place] [address] [place], [place] [address]   Blood Culture (routine x 2)     Status: None (Preliminary result)   Collection Time: 03/18/20  9:10 AM   Specimen: BLOOD  Result Value Ref Range Status   Specimen Description   Final    BLOOD LEFT ANTECUBITAL Performed at [facility] [address], [place], [place] [address]    Special Requests   Final    BLOOD Blood Culture adequate volume Performed at [facility] [address], [place], [place] [address]    Culture   Final    NO GROWTH 2  DAYS Performed at [facility], [place] [address] [place], [place] [address]    Report Status PENDING  Incomplete  Urine culture     Status: Abnormal   Collection Time: 03/18/20  9:57 AM   Specimen: In/Out Cath Urine  Result Value Ref Range Status   Specimen Description   Final    IN/OUT CATH URINE Performed at [facility]  [address] [place], [place] [address]    Special Requests   Final    NONE Performed at [facility], [place] [address], [place], [place] [address]    Culture >=100,000 COLONIES/mL ESCHERICHIA COLI (A)  Final   Report Status 03/20/2020 FINAL  Final   Organism ID, Bacteria ESCHERICHIA COLI (A)  Final      Susceptibility   Escherichia coli - MIC*    AMPICILLIN >=32 RESISTANT Resistant     CEFAZOLIN >=64 RESISTANT Resistant     CEFTRIAXONE <=0.25 SENSITIVE Sensitive     CIPROFLOXACIN 0.5 SENSITIVE Sensitive     GENTAMICIN <=1 SENSITIVE Sensitive     IMIPENEM <=0.25 SENSITIVE Sensitive     NITROFURANTOIN <=16 SENSITIVE Sensitive     TRIMETH/SULFA >=320 RESISTANT Resistant     AMPICILLIN/SULBACTAM >=32 RESISTANT Resistant     PIP/TAZO >=128 RESISTANT Resistant     * >=100,000 COLONIES/mL ESCHERICHIA COLI  MRSA PCR Screening     Status: None   Collection Time: 03/18/20  4:56 PM   Specimen: Nasal Mucosa; Nasopharyngeal  Result Value Ref Range Status   MRSA by PCR NEGATIVE NEGATIVE Final    Comment:        The GeneXpert MRSA Assay (FDA approved for NASAL specimens only), is one component of a comprehensive MRSA colonization surveillance program. It is not intended to diagnose MRSA infection nor to guide or monitor treatment for MRSA infections. Performed at [facility], [place] [address], [place], [place] [address]     Studies/Results: DG CHEST PORT 1 VIEW  Result Date: 03/19/2020 CLINICAL DATA:  Shortness of breath EXAM: PORTABLE CHEST 1 VIEW COMPARISON:  03/18/2020 FINDINGS: There is cardiomegaly with vascular congestion. There is no focal infiltrate. There is no pneumothorax. No large pleural effusion. No acute osseous abnormality. IMPRESSION: Cardiomegaly with vascular congestion. Electronically Signed   By: Constance Holster M.D.   On: 03/19/2020 22:45    Assessment/Plan:  sepsis - improving - cx grew e coli with some resistance. She is  being diuresed and didn't do well with a void trial. Ok to continue foley until she is stable and ambulating more.   Left renal and ureteral stones s/p urgent left stent 8/9/20201 - plan ureteroscopy for for outpatient in 2-3 weeks. She may have stent colic when foley is removed. OK to use tamsulosin 0.4 mg po qhs and oxybutynin 5 mg po BID if needed. Cr improving.   Appreciate excellent hospitalist care. I will sign off but please page GU with any questions or concerns or changes in pt status.     LOS: 2 days   Festus Aloe 03/20/2020, 4:44 PM

## 2020-03-21 LAB — CBC WITH DIFFERENTIAL/PLATELET
Abs Immature Granulocytes: 0.02 10*3/uL (ref 0.00–0.07)
Basophils Absolute: 0 10*3/uL (ref 0.0–0.1)
Basophils Relative: 0 %
Eosinophils Absolute: 0 10*3/uL (ref 0.0–0.5)
Eosinophils Relative: 0 %
HCT: 34.5 % — ABNORMAL LOW (ref 36.0–46.0)
Hemoglobin: 9.9 g/dL — ABNORMAL LOW (ref 12.0–15.0)
Immature Granulocytes: 0 %
Lymphocytes Relative: 10 %
Lymphs Abs: 0.7 10*3/uL (ref 0.7–4.0)
MCH: 25.8 pg — ABNORMAL LOW (ref 26.0–34.0)
MCHC: 28.7 g/dL — ABNORMAL LOW (ref 30.0–36.0)
MCV: 90.1 fL (ref 80.0–100.0)
Monocytes Absolute: 0.4 10*3/uL (ref 0.1–1.0)
Monocytes Relative: 5 %
Neutro Abs: 6.1 10*3/uL (ref 1.7–7.7)
Neutrophils Relative %: 85 %
Platelets: 132 10*3/uL — ABNORMAL LOW (ref 150–400)
RBC: 3.83 MIL/uL — ABNORMAL LOW (ref 3.87–5.11)
RDW: 19.6 % — ABNORMAL HIGH (ref 11.5–15.5)
WBC: 7.3 10*3/uL (ref 4.0–10.5)
nRBC: 0 % (ref 0.0–0.2)

## 2020-03-21 LAB — BASIC METABOLIC PANEL
Anion gap: 8 (ref 5–15)
BUN: 37 mg/dL — ABNORMAL HIGH (ref 6–20)
CO2: 22 mmol/L (ref 22–32)
Calcium: 8.1 mg/dL — ABNORMAL LOW (ref 8.9–10.3)
Chloride: 108 mmol/L (ref 98–111)
Creatinine, Ser: 1.4 mg/dL — ABNORMAL HIGH (ref 0.44–1.00)
GFR calc Af Amer: 49 mL/min — ABNORMAL LOW (ref >60)
GFR calc non Af Amer: 42 mL/min — ABNORMAL LOW (ref >60)
Glucose, Bld: 136 mg/dL — ABNORMAL HIGH (ref 70–99)
Potassium: 4.9 mmol/L (ref 3.5–5.1)
Sodium: 138 mmol/L (ref 135–145)

## 2020-03-21 LAB — BLOOD GAS, ARTERIAL
Acid-base deficit: 3.1 mmol/L — ABNORMAL HIGH (ref 0.0–2.0)
Bicarbonate: 23.9 mmol/L (ref 20.0–28.0)
Drawn by: 331471
FIO2: 21
O2 Saturation: 85.4 %
Patient temperature: 98.6
pCO2 arterial: 56.1 mmHg — ABNORMAL HIGH (ref 32.0–48.0)
pH, Arterial: 7.253 — ABNORMAL LOW (ref 7.350–7.450)
pO2, Arterial: 56.5 mmHg — ABNORMAL LOW (ref 83.0–108.0)

## 2020-03-21 LAB — GLUCOSE, CAPILLARY
Glucose-Capillary: 111 mg/dL — ABNORMAL HIGH (ref 70–99)
Glucose-Capillary: 125 mg/dL — ABNORMAL HIGH (ref 70–99)
Glucose-Capillary: 148 mg/dL — ABNORMAL HIGH (ref 70–99)
Glucose-Capillary: 129 mg/dL — ABNORMAL HIGH (ref 70–99)

## 2020-03-21 MED ORDER — FUROSEMIDE 10 MG/ML IJ SOLN
40.0000 mg | Freq: Two times a day (BID) | INTRAMUSCULAR | Status: DC
  Administered 2020-03-21 – 2020-03-23 (×6): 40 mg via INTRAVENOUS
  Filled 2020-03-21 (×5): qty 4

## 2020-03-21 MED ORDER — TAMSULOSIN HCL 0.4 MG PO CAPS
0.4000 mg | ORAL_CAPSULE | Freq: Every day | ORAL | Status: DC
  Administered 2020-03-21 – 2020-03-26 (×5): 0.4 mg via ORAL
  Filled 2020-03-21 (×6): qty 1

## 2020-03-21 NOTE — Progress Notes (Signed)
PROGRESS NOTE    Gina [name]  NFA:[id number] DOB: 09-Oct-1965 DOA: 03/18/2020 PCP: Elenore Paddy, NP  Brief Narrative:  4 white female BMI 56, intermittent asthma, HTN, chronic pain syndrome?  IBS?  Fibromyalgia, IDDM, OSA, prior stage II-III left leg ulcer Prior exophytic right renal mass right kidney found coincidentally last admission 02/15/2017 Multifactorial anemia Prior Streptococcus galactosemia bacteremia 2018 Prior MRSA soft tissue infection of the chin 2006 Prior lysis of adhesions was BAL TSO 2006 Dr. Glennon Mac more Admit 03/18/2020 septic shock AKI obstructive uropathy with left renal calculi status post left double-J stent-found to have obstructing 5 mm UVJ calculus plus hydronephrosis-initially required pressors and was admitted by critical care medicine  Assessment & Plan:   Principal Problem:   Severe sepsis with septic shock ([place]) Active Problems:   Hyperlipidemia   Type 2 diabetes mellitus with diabetic neuropathy, unspecified (HCC)   Hypertension   Chronic back pain   GERD (gastroesophageal reflux disease)   Morbid obesity with BMI of 50.0-59.9, adult (HCC)   Chronic pain disorder   Peripheral neuropathy   Iron deficiency anemia   Sepsis with acute renal failure (HCC)   Ureteral stone with hydronephrosis   Hyperkalemia   Acute pyelonephritis   Tobacco abuse   Wheezing   1. Resolved septic shock secondary to obstructing 5 mm left ureteric calculus with E. coli (sensitivities are pending) a. Tenuous respiratory status therefore continue ceftriaxone 2 g every 24 and complete 7-day course of antibiotics b. Definitive management as per urology  2. AKI with lactic acidosis likely secondary to resolving oliguria a. Acidosis resolved b. AKI improved 3. Multifactorial delirium secondary probably to hypercarbia and multiple medications which may alter her sensorium a. ABG this morning shows a PCO2 of 55 which is not surprising b. She absolutely refused to wear  BiPAP last night and I have counseled her very clearly in the presence of her husband that she is not going to get better and may in fact get sicker and even to the point where she may need to be intubated if she does not comply c. I have asked that nursing try again to place her on BiPAP d. I have discontinued all psychotropic medications 4. Iatrogenic fluid overload?  With hypoxia a. Still net +6 L will place on Lasix 40 IV twice daily b. If possible daily weights given she is gone from 136 to 147 kg over the past several days I am not sure how accurate that is 5. Left-sided pain a. Continue BNO suppositories- b. Would hold oxybutynin for now (can cause confusion) but can start Flomax 6. Severe obesity BMI 56 OHSS a. As above orders 7. Hyperkalemia secondary to AKI on admission a. Toler Kayexalate given 8/11 b. Etiology is likely secondary to AKI on admission c. A.m. labs 8. Fibromyalgia pain/headaches a. She received fentanyl and OxyIR and Ativan in the past several days-have discontinued all of them b. Absolutely stopping all pain medications/psychotropics 8/12 given encephalopathy which is continuing 9. IDDM + neuropathy + nephropathy-A1c 5.8 on 03/18/2020 a. Continue Lantus 10 units in addition to sliding scale b. Uncontrolled hyperglycemia several days ago c. Sugars are now 111 09/10/1934 patient's husband and have attempted to encourage her 98. Mild thrombocytopenia a. Secondary to sepsis no further work-up 11. Likely dilutional anemia secondary to resuscitation attempts 12. Ongoing tobacco use 13. Hyperlipidemia 14. Reflux 15. HTN a. ACE inhibitor from admission has been discontinued b. Blood pressure trends of slightly elevated  DVT prophylaxis: Heparin Code Status:  Full Family Communication: Long discussion at the bedside with-have encouraged the patient to use BiPAP and ensure that she is compliant on the same if possible otherwise we will need to consider other measures to  make sure that she clears her hypercarbia  Disposition:   Status is: Inpatient  Remains inpatient appropriate because:Hemodynamically unstable, Persistent severe electrolyte disturbances and Unsafe d/c plan   Dispo: The patient is from: Home              Anticipated d/c is to: Unclear at this time              Anticipated d/c date is: 3 days              Patient currently is not medically stable to d/c.       Consultants:   Urologist  Procedures: Double-J stent placement  Antimicrobials: Ceftriaxone   Subjective: Quite somnolent Has not been arousable for the past 24 hours nursing expressing some concerns No chest pain Is out of bed in the chair but seems to repeat back to me what ever I say to her  Objective: Vitals:   03/21/20 0800 03/21/20 0839 03/21/20 0906 03/21/20 1043  BP: 131/67     Pulse:    73  Resp:    20  Temp:   98.3 F (36.8 C)   TempSrc:   Oral   SpO2: 92% 91%  99%  Weight:      Height:        Intake/Output Summary (Last 24 hours) at 03/21/2020 1125 Last data filed at 03/21/2020 0400 Gross per 24 hour  Intake 759.17 ml  Output 3000 ml  Net -2240.83 ml   Filed Weights   03/18/20 1144 03/19/20 0500 03/20/20 1030  Weight: 136.1 kg (!) 145.2 kg (!) 147.5 kg    Examination: Obese coherent no distress thick neck Mallampati 4 Respiratory system: No rales no rhonchi Cardiovascular system: S1-S2 no murmur rub or gallop Gastrointestinal system: Obese poor exam Central nervous system: Moves all 4 limbs equally Extremities: None--- has some ichthyosis Skin: Stage I lower extremity edema Psychiatry: Euthymic but sleepy  Data Reviewed: I have personally reviewed following labs and imaging studies Potassium 6.1-->4.9 Bicarb 22 BUN/creatinine peak of 26/2.0-->33/1.5 currently---37/1.4 Procalcitonin peak of 23-->11.3 White count 14.4-->7.3 Platelet count 151-->132 Radiology Studies: DG CHEST PORT 1 VIEW  Result Date: 03/19/2020 CLINICAL DATA:   Shortness of breath EXAM: PORTABLE CHEST 1 VIEW COMPARISON:  03/18/2020 FINDINGS: There is cardiomegaly with vascular congestion. There is no focal infiltrate. There is no pneumothorax. No large pleural effusion. No acute osseous abnormality. IMPRESSION: Cardiomegaly with vascular congestion. Electronically Signed   By: Constance Holster M.D.   On: 03/19/2020 22:45     Scheduled Meds: . (feeding supplement) PROSource Plus  30 mL Oral TID BM  . atorvastatin  40 mg Oral Daily  . budesonide (PULMICORT) nebulizer solution  0.5 mg Nebulization BID  . Chlorhexidine Gluconate Cloth  6 each Topical Daily  . fluticasone  2 spray Each Nare Daily  . furosemide  40 mg Intravenous Q12H  . heparin  5,000 Units Subcutaneous Q8H  . insulin aspart  0-20 Units Subcutaneous TID WC  . insulin aspart  0-5 Units Subcutaneous QHS  . insulin glargine  10 Units Subcutaneous Daily  . mouth rinse  15 mL Mouth Rinse BID  . pantoprazole (PROTONIX) IV  40 mg Intravenous QHS  . sodium chloride flush  3 mL Intravenous Once   Continuous Infusions: . cefTRIAXone (  ROCEPHIN)  IV Stopped (03/20/20 1903)     LOS: 3 days    Time spent: [place], MD [facility] To contact the attending provider between 7A-7P or the covering provider during after hours 7P-7A, please log into the web site [email or web address] and access using universal [place] password for that web site. If you do not have the password, please call the hospital operator.  03/21/2020, 11:25 AM

## 2020-03-22 ENCOUNTER — Inpatient Hospital Stay (HOSPITAL_COMMUNITY): Payer: Medicare Other

## 2020-03-22 ENCOUNTER — Inpatient Hospital Stay: Payer: Medicare Other

## 2020-03-22 DIAGNOSIS — A419 Sepsis, unspecified organism: Secondary | ICD-10-CM | POA: Diagnosis not present

## 2020-03-22 DIAGNOSIS — R6521 Severe sepsis with septic shock: Secondary | ICD-10-CM | POA: Diagnosis not present

## 2020-03-22 DIAGNOSIS — T17908A Unspecified foreign body in respiratory tract, part unspecified causing other injury, initial encounter: Secondary | ICD-10-CM

## 2020-03-22 LAB — COMPREHENSIVE METABOLIC PANEL
ALT: 53 U/L — ABNORMAL HIGH (ref 0–44)
AST: 36 U/L (ref 15–41)
Albumin: 3.9 g/dL (ref 3.5–5.0)
Alkaline Phosphatase: 115 U/L (ref 38–126)
Anion gap: 11 (ref 5–15)
BUN: 30 mg/dL — ABNORMAL HIGH (ref 6–20)
CO2: 27 mmol/L (ref 22–32)
Calcium: 9.1 mg/dL (ref 8.9–10.3)
Chloride: 104 mmol/L (ref 98–111)
Creatinine, Ser: 1 mg/dL (ref 0.44–1.00)
GFR calc Af Amer: >60 mL/min (ref >60)
GFR calc non Af Amer: >60 mL/min (ref >60)
Glucose, Bld: 164 mg/dL — ABNORMAL HIGH (ref 70–99)
Potassium: 4.4 mmol/L (ref 3.5–5.1)
Sodium: 142 mmol/L (ref 135–145)
Total Bilirubin: 0.6 mg/dL (ref 0.3–1.2)
Total Protein: 7.8 g/dL (ref 6.5–8.1)

## 2020-03-22 LAB — CBC WITH DIFFERENTIAL/PLATELET
Abs Immature Granulocytes: 0.02 10*3/uL (ref 0.00–0.07)
Basophils Absolute: 0 10*3/uL (ref 0.0–0.1)
Basophils Relative: 0 %
Eosinophils Absolute: 0.1 10*3/uL (ref 0.0–0.5)
Eosinophils Relative: 1 %
HCT: 40.5 % (ref 36.0–46.0)
Hemoglobin: 11.5 g/dL — ABNORMAL LOW (ref 12.0–15.0)
Immature Granulocytes: 0 %
Lymphocytes Relative: 13 %
Lymphs Abs: 0.7 10*3/uL (ref 0.7–4.0)
MCH: 25.2 pg — ABNORMAL LOW (ref 26.0–34.0)
MCHC: 28.4 g/dL — ABNORMAL LOW (ref 30.0–36.0)
MCV: 88.6 fL (ref 80.0–100.0)
Monocytes Absolute: 0.4 10*3/uL (ref 0.1–1.0)
Monocytes Relative: 7 %
Neutro Abs: 3.9 10*3/uL (ref 1.7–7.7)
Neutrophils Relative %: 79 %
Platelets: 142 10*3/uL — ABNORMAL LOW (ref 150–400)
RBC: 4.57 MIL/uL (ref 3.87–5.11)
RDW: 19 % — ABNORMAL HIGH (ref 11.5–15.5)
WBC: 5 10*3/uL (ref 4.0–10.5)
nRBC: 0 % (ref 0.0–0.2)

## 2020-03-22 LAB — HEPATIC FUNCTION PANEL
ALT: 53 U/L — ABNORMAL HIGH (ref 0–44)
AST: 36 U/L (ref 15–41)
Albumin: 3.7 g/dL (ref 3.5–5.0)
Alkaline Phosphatase: 113 U/L (ref 38–126)
Bilirubin, Direct: 0.2 mg/dL (ref 0.0–0.2)
Indirect Bilirubin: 0.5 mg/dL (ref 0.3–0.9)
Total Bilirubin: 0.7 mg/dL (ref 0.3–1.2)
Total Protein: 7.7 g/dL (ref 6.5–8.1)

## 2020-03-22 LAB — BLOOD GAS, ARTERIAL
Acid-Base Excess: 3.1 mmol/L — ABNORMAL HIGH (ref 0.0–2.0)
Bicarbonate: 29.8 mmol/L — ABNORMAL HIGH (ref 20.0–28.0)
O2 Saturation: 95.5 %
Patient temperature: 98.2
pCO2 arterial: 58.5 mmHg — ABNORMAL HIGH (ref 32.0–48.0)
pH, Arterial: 7.326 — ABNORMAL LOW (ref 7.350–7.450)
pO2, Arterial: 80.7 mmHg — ABNORMAL LOW (ref 83.0–108.0)

## 2020-03-22 LAB — GLUCOSE, CAPILLARY
Glucose-Capillary: 161 mg/dL — ABNORMAL HIGH (ref 70–99)
Glucose-Capillary: 206 mg/dL — ABNORMAL HIGH (ref 70–99)
Glucose-Capillary: 112 mg/dL — ABNORMAL HIGH (ref 70–99)
Glucose-Capillary: 149 mg/dL — ABNORMAL HIGH (ref 70–99)
Glucose-Capillary: 118 mg/dL — ABNORMAL HIGH (ref 70–99)

## 2020-03-22 LAB — AMMONIA: Ammonia: 44 umol/L — ABNORMAL HIGH (ref 9–35)

## 2020-03-22 MED ORDER — LORAZEPAM 0.5 MG PO TABS
0.5000 mg | ORAL_TABLET | Freq: Two times a day (BID) | ORAL | Status: DC
  Administered 2020-03-22: 0.5 mg via ORAL
  Filled 2020-03-22 (×2): qty 1

## 2020-03-22 MED ORDER — SODIUM CHLORIDE 0.9 % IV SOLN
8.0000 mg | Freq: Once | INTRAVENOUS | Status: AC
  Administered 2020-03-22: 8 mg via INTRAVENOUS
  Filled 2020-03-22: qty 4

## 2020-03-22 MED ORDER — OXYCODONE HCL 5 MG PO TABS
2.5000 mg | ORAL_TABLET | Freq: Three times a day (TID) | ORAL | Status: DC | PRN
  Administered 2020-03-22 – 2020-03-24 (×3): 2.5 mg via ORAL
  Filled 2020-03-22 (×4): qty 1

## 2020-03-22 MED ORDER — FLEET ENEMA 7-19 GM/118ML RE ENEM
1.0000 | ENEMA | Freq: Once | RECTAL | Status: AC
  Administered 2020-03-22: 1 via RECTAL
  Filled 2020-03-22: qty 1

## 2020-03-22 MED ORDER — LORAZEPAM 2 MG/ML IJ SOLN
0.5000 mg | Freq: Once | INTRAMUSCULAR | Status: AC
  Administered 2020-03-22: 0.5 mg via INTRAVENOUS
  Filled 2020-03-22: qty 1

## 2020-03-22 MED ORDER — BELLADONNA ALKALOIDS-OPIUM 16.2-60 MG RE SUPP: 1.0000 | Freq: Four times a day (QID) | RECTAL | Status: DC | PRN

## 2020-03-22 MED ORDER — IBUPROFEN 800 MG PO TABS
800.0000 mg | ORAL_TABLET | Freq: Three times a day (TID) | ORAL | Status: DC
  Administered 2020-03-22 – 2020-03-26 (×12): 800 mg via ORAL
  Filled 2020-03-22 (×12): qty 1

## 2020-03-22 MED ORDER — SODIUM CHLORIDE 0.9 % IV SOLN
1.0000 g | Freq: Three times a day (TID) | INTRAVENOUS | Status: DC
  Administered 2020-03-22 – 2020-03-25 (×10): 1 g via INTRAVENOUS
  Filled 2020-03-22 (×10): qty 1

## 2020-03-22 MED ORDER — PROMETHAZINE HCL 25 MG/ML IJ SOLN
12.5000 mg | Freq: Once | INTRAMUSCULAR | Status: AC
  Administered 2020-03-22: 12.5 mg via INTRAVENOUS
  Filled 2020-03-22: qty 1

## 2020-03-22 MED ORDER — LACTULOSE 10 GM/15ML PO SOLN
20.0000 g | Freq: Two times a day (BID) | ORAL | Status: DC
  Administered 2020-03-22 – 2020-03-26 (×8): 20 g via ORAL
  Filled 2020-03-22 (×6): qty 30

## 2020-03-22 MED ORDER — SODIUM CHLORIDE 0.9 % IV SOLN
INTRAVENOUS | Status: DC | PRN
  Administered 2020-03-22: 250 mL via INTRAVENOUS

## 2020-03-22 NOTE — Progress Notes (Signed)
PROGRESS NOTE    Gina [name]  ZOX:[id number] DOB: 1966/05/07 DOA: 03/18/2020 PCP: Elenore Paddy, NP  Brief Narrative:  1 white female BMI 56, intermittent asthma, HTN, chronic pain syndrome?  IBS?  Fibromyalgia, IDDM, OSA, prior stage II-III left leg ulcer Prior exophytic right renal mass right kidney found coincidentally last admission 02/15/2017 Multifactorial anemia Prior Streptococcus galactosemia bacteremia 2018 Prior MRSA soft tissue infection of the chin 2006 Prior lysis of adhesions was BAL TSO 2006 Dr. Glennon Mac more  Admit 03/18/2020 septic shock AKI obstructive uropathy with left renal calculi status post left double-J stent-found to have obstructing 5 mm UVJ calculus plus hydronephrosis-initially required pressors and was admitted by critical care medicine was transferred to the hospitalist service on 8/10 She is remained somewhat encephalopathic despite adjustment of meds and opiates and I have asked critical care to revisit her 8/13  Assessment & Plan:   Principal Problem:   Severe sepsis with septic shock ([place]) Active Problems:   Hyperlipidemia   Type 2 diabetes mellitus with diabetic neuropathy, unspecified ([place])   Hypertension   Chronic back pain   GERD (gastroesophageal reflux disease)   Morbid obesity with BMI of 50.0-59.9, adult (HCC)   Chronic pain disorder   Peripheral neuropathy   Iron deficiency anemia   Sepsis with acute renal failure ([place])   Ureteral stone with hydronephrosis   Hyperkalemia   Acute pyelonephritis   Tobacco abuse   Wheezing   1. Resolved septic shock secondary to obstructing 5 mm left ureteric calculus with E. coli (sensitivities are pending) a. Tenuous respiratory status therefore continue antibiotics as IV-see below discussion b. Definitive management as per urology  2. Abdominal pain NOS a. Cannot get a clear history from the patient b. We will attempt to get CT scan abdomen pelvis to see if there is any obstruction or blockage  of the stent however she is passing clear urine copiously c. If unable to do so we will place order for plain film x-ray of abdomen later today 3. Aspiration pneumonia diagnosed 8/13 a.m. a. Coughing and vomiting overnight 8/12-seems to have aspirated b. CXR = aspiration c. Meropenem started monitor respiratory status carefully d. High risk for intubation if does not cooperate with BiPAP and may be a contraindication for the same  e. will give only essential meds 4. AKI with lactic acidosis likely secondary to resolving oliguria a. Acidosis resolved b. AKI improved 5. Multifactorial delirium secondary probably to hypercarbia and multiple medications which may alter her sensorium a. ABG 8/12 PCO2 of 55 which is not surprising b. Refusing BiPAP c. Difficult situation keep on stepdown for one-to-one monitoring needs a safety sitter d. I have asked critical care to eyeball her again-she was quite lethargic 8/11 and 8/12 but now she is awake she is crying out with pain and refuses to wear BiPAP e. I have had 3-4 multiple concurrent discussions with her husband who is discussing with family and tells me at this stage she would want EVERYTHING DONE, INCLUDING INTUBATION 6. Iatrogenic fluid overload?  With hypoxia a. Continue Lasix 40 IV twice daily b. -8 L although weight has not changed? 7. Left-sided pain a. BNO suppositories holding oxybutynin continue Flomax if able to take p.o. 8. Severe obesity BMI 56 OHSS a. As above orders 9. Hyperkalemia secondary to AKI on admission a. Potassium resolved and was likely secondary to AKI on admission b. Monitor trends 10. Fibromyalgia pain/headaches a. Initially on fentanyl and OxyIR as well as Ativan for pain b.  These were all discontinued 8/12 c. I have had to resume some anxiolytics Ativan, increased her ibuprofen dosing and started low-dose oxycodone 8/13 because she is crying out in pain d. Husband tells me she is habituated to opiates for the  past 20 years at least 14. IDDM + neuropathy + nephropathy-A1c 5.8 on 03/18/2020 a. Continue Lantus 10 units in addition to sliding scale b. Uncontrolled hyperglycemia several days ago c. Sugars are now 111 09/10/1934 patient's husband and have attempted to encourage her 3. Mild thrombocytopenia a. Secondary to sepsis no further work-up 13. HTN a. ACE inhibitor from admission has been discontinued b. Blood pressure trends of slightly elevated 14. Likely dilutional anemia secondary to resuscitation attempts 15. Ongoing tobacco use 16. Hyperlipidemia 17. Reflux   DVT prophylaxis: Heparin Code Status: Full Family Communication: Long discussion at the bedside several times today with nursing staff and husband   Disposition:   Status is: Inpatient  Remains inpatient appropriate because:Hemodynamically unstable, Persistent severe electrolyte disturbances and Unsafe d/c plan   Dispo: The patient is from: Home              Anticipated d/c is to: Unclear at this time              Anticipated d/c date is: 3 days              Patient currently is not medically stable to d/c.       Consultants:   Urologist  Procedures: Double-J stent placement  Antimicrobials: Ceftriaxone   Subjective: Quite somnolent Has not been arousable for the past 24 hours nursing expressing some concerns No chest pain less somnolent but confused Calling out to people who were not there Saying names that do not make sense and overall awake but agitated Trying to crawl out of bed I took off two-point restraints this morning but they had to be replaced because she was pulling out her Foley and her IV tubes when I went to review her later on today I pulled the husband aside and had a long discussion and he tells me in the past she would not have wanted to be aggressively resuscitated but after discussion with his daughters she remains a FULL code  Objective: Vitals:   03/22/20 0811 03/22/20 1058 03/22/20  1205 03/22/20 1301  BP:  (!) 191/67 (!) 197/80 (!) 199/70  Pulse:  80 81 80  Resp:  20 (!) 31 19  Temp:      TempSrc:      SpO2: 96% 92% 96% 97%  Weight:      Height:        Intake/Output Summary (Last 24 hours) at 03/22/2020 1618 Last data filed at 03/22/2020 1500 Gross per 24 hour  Intake 310.29 ml  Output 10850 ml  Net -10539.71 ml   Filed Weights   03/19/20 0500 03/20/20 1030 03/22/20 0500  Weight: (!) 145.2 kg (!) 147.5 kg (!) 147.9 kg    Examination: Obese incoherent writhing in the bed Respiratory system: No rales no rhonchi that I can appreciate because of her habitus Cardiovascular system: S1-S2 no murmur rub or gallop Gastrointestinal system: Obese poor exam Central nervous system: Moves all 4 limbs equally Extremities: None--- has some ichthyosis Skin: Stage I lower extremity edema Psychiatry: Agitated  Data Reviewed: I have personally reviewed following labs and imaging studies Potassium 6.1-->4.9-->4.4 Bicarb 27 BUN/creatinine peak of 26/2.0-->33/1.5-->30/1.0 Procalcitonin peak of 23-->11.3 White count 14.4--> 5.0 Platelet count 151--> 142 Radiology Studies: DG CHEST PORT 1  VIEW  Result Date: 03/22/2020 CLINICAL DATA:  54 year old female possible aspiration. EXAM: PORTABLE CHEST 1 VIEW COMPARISON:  Portable chest 03/19/2020 and earlier. FINDINGS: Portable AP semi upright view at 0218 hours. Increased kyphotic positioning. Mediastinal contours appear stable. The patient's head obscures some of the lung apices. New Patchy and confluent right perihilar and lower lung opacity. Questionable mild increased left perihilar opacity. No pneumothorax. No pleural effusion is evident. Stable visualized osseous structures. IMPRESSION: New patchy and confluent right perihilar and lower lung opacity, compatible with aspiration in this setting. Electronically Signed   By: Genevie Ann M.D.   On: 03/22/2020 02:34     Scheduled Meds: . (feeding supplement) PROSource Plus  30 mL  Oral TID BM  . atorvastatin  40 mg Oral Daily  . budesonide (PULMICORT) nebulizer solution  0.5 mg Nebulization BID  . Chlorhexidine Gluconate Cloth  6 each Topical Daily  . fluticasone  2 spray Each Nare Daily  . furosemide  40 mg Intravenous Q12H  . heparin  5,000 Units Subcutaneous Q8H  . ibuprofen  800 mg Oral TID  . insulin aspart  0-20 Units Subcutaneous TID WC  . insulin aspart  0-5 Units Subcutaneous QHS  . insulin glargine  10 Units Subcutaneous Daily  . lactulose  20 g Oral BID  . LORazepam  0.5 mg Oral BID  . mouth rinse  15 mL Mouth Rinse BID  . pantoprazole (PROTONIX) IV  40 mg Intravenous QHS  . sodium chloride flush  3 mL Intravenous Once  . tamsulosin  0.4 mg Oral Daily   Continuous Infusions: . sodium chloride 10 mL/hr at 03/22/20 1500  . meropenem (MERREM) IV Stopped (03/22/20 1344)     LOS: 4 days    Time spent: [place], MD [facility] To contact the attending provider between 7A-7P or the covering provider during after hours 7P-7A, please log into the web site [email or web address] and access using universal [place] password for that web site. If you do not have the password, please call the hospital operator.  03/22/2020, 4:18 PM

## 2020-03-22 NOTE — TOC Progression Note (Signed)
Transition of Care [facility]) - Progression Note    Patient Details  Name: Gina [name] MRN: [id number] Date of Birth: November 25, 1965  Transition of Care [facility]) CM/SW Contact  Leeroy Cha, RN Phone Number: 03/22/2020, 9:55 AM  Clinical Narrative:    Pt with 72mm obstructive calculus s/p double j stent insertion,  at 2l/min, Chronulac, iv lasix 40 q12hr. Following for progression and toc needs-pt needs bipap but refuses to wear and is now having active emsis.   Expected Discharge Plan: Home/Self Care Barriers to Discharge: Continued Medical Work up  Expected Discharge Plan and Services Expected Discharge Plan: Home/Self Care   Discharge Planning Services: CM Consult   Living arrangements for the past 2 months: Single Family Home                                       Social Determinants of Health (SDOH) Interventions    Readmission Risk Interventions No flowsheet data found.

## 2020-03-22 NOTE — Evaluation (Signed)
Clinical/Bedside Swallow Evaluation Patient Details  Name: Gina [name] MRN: [id number] Date of Birth: 1966/03/13  Today's Date: 03/22/2020 Time: SLP Start Time (ACUTE ONLY): 79 SLP Stop Time (ACUTE ONLY): 1554 SLP Time Calculation (min) (ACUTE ONLY): 14 min  Past Medical History:  Past Medical History:  Diagnosis Date  . Anemia   . Anxiety   . Arthritis   . Asthma   . Back pain   . Chronic pain   . Diabetes mellitus    type 2  . Family history of adverse reaction to anesthesia    youngest daughter hard to wake up both daughters post op n/v  . Fibromyalgia   . GERD (gastroesophageal reflux disease)   . Headache    migraines  . History of kidney stones    2 big stones in left kidney now  . History of recurrent UTIs    last uti 3 weeks ago  . Hyperlipidemia   . Hypertension   . IBS (irritable bowel syndrome)   . Obesities, morbid ([place])   . Pneumonia 06/2016  . PONV (postoperative nausea and vomiting) 2004   came back after hystectomy bowels locked up on life support for 1 month   . Sinusitis   . 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 Umbilical hernia   . Wound discharge    open wound right leg going to wound center changes dressing qday with santyl cream quarter size wound   Past Surgical History:  Past Surgical History:  Procedure Laterality Date  . ABDOMINAL HYSTERECTOMY     1 ovary left  . CARPAL TUNNEL RELEASE Right   . CESAREAN SECTION     x 2  . CHOLECYSTECTOMY    . COLONOSCOPY WITH PROPOFOL N/A 12/03/2016   Procedure: COLONOSCOPY WITH PROPOFOL;  Surgeon: Doran Stabler, MD;  Location: [facility];  Service: Gastroenterology;  Laterality: N/A;  . CYSTECTOMY     left foot  . CYSTOSCOPY/URETEROSCOPY/HOLMIUM LASER/STENT PLACEMENT Left 03/18/2020   Procedure: CYSTOSCOPY/LEFT RETROGRADE PYELOGRAM, STENT PLACEMENT;  Surgeon: Ceasar Mons, MD;  Location: [facility];  Service: Urology;  Laterality: Left;  .  ESOPHAGOGASTRODUODENOSCOPY (EGD) WITH PROPOFOL N/A 12/03/2016   Procedure: ESOPHAGOGASTRODUODENOSCOPY (EGD) WITH PROPOFOL;  Surgeon: Doran Stabler, MD;  Location: [facility];  Service: Gastroenterology;  Laterality: N/A;  . LITHOTRIPSY    . MULTIPLE TOOTH EXTRACTIONS     HPI:  54 yo female with h/o fibromyalgia, GERD, HLD, asthma, DM, anxiety, anemia, chronic pain and headache adm on 8/9 with sharp left flank pain.  She was diagnosed with sepsis/SIRS.  CT imaging showed ostructucting stone ont he left with hydronephrosis.  She is now s/p cystoscopy with left JJ stent on 8/9.  Post-op, pt with worsening AMS and hypercarbic.  CXR showed new patchy and confluent right perihilar and lower lung opacity compatible with aspiration in this setting. MD stopped all medications that would contribute to AMS.  Swallow eval ordered.   Assessment / Plan / Recommendation Clinical Impression  Pt with no focal CN deficits but she does have altered mentation negatively impacting her ability to consume po intake.  She is confused, inconsistently follows directions but was able to participate to take medicine with water and consume some Mango flavored icee.  Cough noted immediately post-swallow with 1 of approx 6 boluses - with sequential thin water consumption only.  No indication of aspiration with small single boluses of thin via straw even when  taking pain medication from RN.   SLP placed pt's bed in reverse trendelenberg position to maximize airway protection given her restraints and poor positioning.  Pt accepted only 1/4 tsp of applesauce, grimaced and swallowed with delay but NO indication of aspiration/airway compromise.  SLP did not test solids with pt due to her mentation.  Recommend continue clears only with very strict precautions including assuring proper positioning and mentation.  SLP will follow up to assure po tolerance and assist in dietary advancement recommendations.  Anticipate as pt's mentation  improves, her swallow function will be adequate. SLP Visit Diagnosis: Dysphagia, unspecified (R13.10)    Aspiration Risk  Moderate aspiration risk    Diet Recommendation Other (Comment) (clears when fully alert and accepting)   Liquid Administration via: Straw Supervision: Full supervision/cueing for compensatory strategies Compensations: Slow rate;Small sips/bites Postural Changes: Other (Comment) (as upright as possible)    Other  Recommendations Oral Care Recommendations: Oral care BID Other Recommendations: Have oral suction available   Follow up Recommendations  (TBD)      Frequency and Duration min 1 x/week  2 weeks       Prognosis Prognosis for Safe Diet Advancement: Fair Barriers to Reach Goals: Cognitive deficits      Swallow Study   General Date of Onset: 03/22/20 HPI: 53 yo female with h/o fibromyalgia, GERD, HLD, asthma, DM, anxiety, anemia, chronic pain and headache adm on 8/9 with sharp left flank pain.  She was diagnosed with sepsis/SIRS.  CT imaging showed ostructucting stone ont he left with hydronephrosis.  She is now s/p cystoscopy with left JJ stent on 8/9.  Post-op, pt with worsening AMS and hypercarbic.  CXR showed new patchy and confluent right perihilar and lower lung opacity compatible with aspiration in this setting. MD stopped all medications that would contribute to AMS.  Swallow eval ordered. Type of Study: Bedside Swallow Evaluation Diet Prior to this Study: Thin liquids (clears) Temperature Spikes Noted: No Respiratory Status: Nasal cannula History of Recent Intubation: No Behavior/Cognition: Alert;Doesn't follow directions;Confused;Distractible Oral Cavity Assessment: Within Functional Limits Oral Care Completed by SLP: No Oral Cavity - Dentition: Other (Comment) (some dentition missing) Vision: Impaired for self-feeding Self-Feeding Abilities: Needs set up Patient Positioning: Partially reclined Baseline Vocal Quality: Other (comment)  (voice is adequate) Volitional Cough: Cognitively unable to elicit Volitional Swallow: Unable to elicit    Oral/Motor/Sensory Function Overall Oral Motor/Sensory Function: Generalized oral weakness (no focal CN deficits)   Ice Chips Ice chips: Not tested   Thin Liquid Thin Liquid: Impaired Presentation: Straw Pharyngeal  Phase Impairments: Cough - Immediate Other Comments: Cough noted immediately post-swallow with 1 of approx 6 boluses - with sequential thin water consumptiononly.  No indication of aspiration with small single boluses even when taking pain medication from RN; SLP placed pt's bed in reverse trendelenberg position to maximize airway protection given her restraints and poor positioning    Nectar Thick Presentation: Spoon Pharyngeal Phase Impairments: Suspected delayed Swallow Other Comments: Mango Icee - via tsp - clinically appears with mild delay but no indication of aspiration nor oral retention   Honey Thick Honey Thick Liquid: Not tested   Puree Puree: Within functional limits Presentation: Spoon Other Comments: Pt accepted only 1/4 tsp of applesauce, grimaced and swallowed with delay.  NO indication of aspiration/airway compromise with this consistency.   Solid     Solid: Not tested Other Comments: Pt is on clear liquids but also does not have adequate mentation to consume solids requiring mastication.  Macario Golds 03/22/2020,4:48 PM   Kathleen Lime, MS [place] [facility] [phone]

## 2020-03-22 NOTE — Progress Notes (Signed)
Paged on call concerning Pt continuing emesis awaiting new orders if any. Will continue to monitor.

## 2020-03-22 NOTE — Progress Notes (Signed)
Pt. has been refusing to wear/keep BiPAP on and currently has been vomiting, attempting to keep n/c set at 2 lpm for sats @ 92% per order.

## 2020-03-22 NOTE — Progress Notes (Signed)
Pt. Refusing to wear Bipap or CPAP at this time.  Both devices are in room.  Will continue to monitor for changes.

## 2020-03-22 NOTE — Progress Notes (Signed)
Pharmacy Antibiotic Note  Gina [name] is a 54 y.o. female admitted on 03/18/2020 with pneumonia.  Pharmacy has been consulted for meropenem dosing.  Plan: Meropenem 1gm iv q8hr  Height: 5\' 3"  (160 cm) Weight: (!) 147.5 kg (325 lb 2.9 oz) IBW/kg (Calculated) : 52.4  Temp (24hrs), Avg:97.9 F (36.6 C), Min:96.9 F (36.1 C), Max:98.6 F (37 C)  Recent Labs  Lab 03/18/20 0801 03/18/20 1357 03/18/20 1400 03/18/20 1944 03/19/20 0258 03/19/20 1201 03/20/20 0138 03/21/20 0212 03/22/20 0217  WBC 13.8*  --   --   --  14.4*  --  7.0 7.3 5.0  CREATININE 1.87*  --    < >  --  1.87* 1.65* 1.52* 1.40* 1.00  LATICACIDVEN 2.2* 1.4  --  1.0  --   --   --   --   --    < > = values in this interval not displayed.    Estimated Creatinine Clearance: 90.7 mL/min (by C-G formula based on SCr of 1 mg/dL).    Allergies  Allergen Reactions  . Darvocet [Propoxyphene N-Acetaminophen] Nausea And Vomiting  . Morphine And Related Itching  . Penicillins Hives and Swelling    Tolerates Rocephin  Swelling all over body  Has patient had a PCN reaction causing immediate rash, facial/tongue/throat swelling, SOB or lightheadedness with hypotension: Yes Has patient had a PCN reaction causing severe rash involving mucus membranes or skin necrosis: Yes Has patient had a PCN reaction that required hospitalization Unknown Has patient had a PCN reaction occurring within the last 10 years: No  If all of the above answers are "NO", then may proceed with Cephalosporin use.   . Tape Hives    Adhesive tape  . Other     Bee venom-anaphylaxis     Antimicrobials this admission: Meropenem 03/22/2020 >>  Dose adjustments this admission: -  Microbiology results: -  Thank you for allowing pharmacy to be a part of this patient's care.  Gina [name] 03/22/2020 5:17 AM

## 2020-03-22 NOTE — Progress Notes (Signed)
NAME:  Gina [name], MRN:  [id number], DOB:  Jun 30, 1966, LOS: 4 ADMISSION DATE:  03/18/2020, CONSULTATION DATE:  NA REFERRING MD:  Winter, CHIEF COMPLAINT:  Septic shock   Brief History   54 year old female who presented to the ED on 8/9 w/ cc: sharp left flank pain. In ER was febrile, tachycardic, met SIRS/sepsis criteria. Initially was normotensive but became hypotensive in the ER in spite of IV crystalloid resuscitation. CT imaging showed left obstructing 78m stone w/ associated hydro. Urology was called. The patient went to the OR where she underwent cystoscopy and Left JJ stent. PCCM asked to admit  History of present illness   See above   Past Medical History  Obesity, anemia, asthma, dm type II, GERD, Fibromyalgia, kidney stones, recurrent UTIs Significant Hospital Events   8[facility]9 [facility]  [facility] P[facility]M called back for delirium, refusing to wear BiPAP with hypercarbia  Consults:  Urology   Procedures:  8/9 cystoscopy and Left JJ stent.  Significant Diagnostic Tests:  CT renal 8/9: 1. 3 x 3 x 5 mm distal left ureteral stone with mild left hydroureteronephrosis.2. Cluster of stones and/or staghorn calculus in the lower pole left kidney, similar to prior.3. Hepatomegaly with hepatic steatosis.4. Stable exophytic lesion upper pole right kidney since 29390,ZESPQZa cyst complicated by proteinaceous debris or hemorrhage.5. Aortic Atherosclerosis (ICD10-I70.0).  Micro Data:  UC 8/9>>> BC 8/9>>>  Antimicrobials:  Ceftriaxone 8/9 >> 8/13 Meropenem 8/13 >>   Interim history/subjective:   PCCM called back for delirium, hypercarbia.  Concern for further deterioration  Objective   Blood pressure (!) 199/70, pulse 80, temperature 97.9 F (36.6 C), temperature source Oral, resp. rate 19, height _0  (1.6 m), weight (!) 147.9 kg, SpO2 97 %.        Intake/Output Summary (Last 24 hours) at 03/22/2020 1629 Last data filed at 03/22/2020 1500 Gross per 24 hour    Intake 310.29 ml  Output 10850 ml  Net -10539.71 ml   Filed Weights   03/19/20 0500 03/20/20 1030 03/22/20 0500  Weight: (!) 145.2 kg (!) 147.5 kg (!) 147.9 kg    Examination: Blood pressure (!) 199/70, pulse 80, temperature 97.9 F (36.6 C), temperature source Oral, resp. rate 19, height _1  (1.6 m), weight (!) 147.9 kg, SpO2 97 %. Gen:      No acute distress, obese HEENT:  EOMI, sclera anicteric Neck:     No masses; no thyromegaly Lungs:    Clear to auscultation bilaterally; normal respiratory effort CV:         Regular rate and rhythm; no murmurs Abd:      + bowel sounds; soft, non-tender; no palpable masses, no distension Ext:    No edema; adequate peripheral perfusion Skin:      Warm and dry; no rash Neuro: Awake, responds to questions  Labs reviewed significant for  Resolved Hospital Problem list     Assessment & Plan:  Delirium, hypercarbic respiratory failure, likely has OSA/OHS Also has volume overload, new infiltrate concerning for pneumonia Encouraged to use use BiPAP. Does not need intubation at present Continue close monitoring Antibiotics as below Lasix for diuresis  Severe sepsis/septic shock secondary to urinary tract source E. coli infection Plan Improved hemodynamics. Ceftriaxone changed to meropenem today  obstructive uropathy due to left renal calculi now status post left JJ stent Plan Further recommendations per urology  Acute kidney injury with lactic acidosis -Secondary to sepsis Plan Improving  Best practice:  Diet: PO  Pain/Anxiety/Delirium protocol (if indicated): NNA VAP protocol (if indicated): NA DVT prophylaxis: [place] heparin GI prophylaxis: PPI Glucose control: ssi Mobility: BR Code Status: full code  Family Communication: per primary team  Disposition: SDU  Critical care time:    The patient is critically ill with multiple organ system failure and requires high complexity decision making for assessment and support,  frequent evaluation and titration of therapies, advanced monitoring, review of radiographic studies and interpretation of complex data.   Critical Care Time devoted to patient care services, exclusive of separately billable procedures, described in this note is 45 minutes.   Marshell Garfinkel MD [facility] Please see [email or web address] for pager details.  03/22/2020, 4:30 PM

## 2020-03-23 DIAGNOSIS — A419 Sepsis, unspecified organism: Secondary | ICD-10-CM | POA: Diagnosis not present

## 2020-03-23 DIAGNOSIS — T17908A Unspecified foreign body in respiratory tract, part unspecified causing other injury, initial encounter: Secondary | ICD-10-CM

## 2020-03-23 DIAGNOSIS — J9601 Acute respiratory failure with hypoxia: Secondary | ICD-10-CM

## 2020-03-23 DIAGNOSIS — R6521 Severe sepsis with septic shock: Secondary | ICD-10-CM | POA: Diagnosis not present

## 2020-03-23 DIAGNOSIS — N132 Hydronephrosis with renal and ureteral calculous obstruction: Secondary | ICD-10-CM | POA: Diagnosis not present

## 2020-03-23 DIAGNOSIS — R6520 Severe sepsis without septic shock: Secondary | ICD-10-CM | POA: Diagnosis not present

## 2020-03-23 LAB — CBC WITH DIFFERENTIAL/PLATELET
Abs Immature Granulocytes: 0.05 10*3/uL (ref 0.00–0.07)
Basophils Absolute: 0 10*3/uL (ref 0.0–0.1)
Basophils Relative: 0 %
Eosinophils Absolute: 0 10*3/uL (ref 0.0–0.5)
Eosinophils Relative: 1 %
HCT: 36.6 % (ref 36.0–46.0)
Hemoglobin: 11.4 g/dL — ABNORMAL LOW (ref 12.0–15.0)
Immature Granulocytes: 1 %
Lymphocytes Relative: 13 %
Lymphs Abs: 0.9 10*3/uL (ref 0.7–4.0)
MCH: 26 pg (ref 26.0–34.0)
MCHC: 31.1 g/dL (ref 30.0–36.0)
MCV: 83.6 fL (ref 80.0–100.0)
Monocytes Absolute: 0.4 10*3/uL (ref 0.1–1.0)
Monocytes Relative: 5 %
Neutro Abs: 5.2 10*3/uL (ref 1.7–7.7)
Neutrophils Relative %: 80 %
Platelets: 174 10*3/uL (ref 150–400)
RBC: 4.38 MIL/uL (ref 3.87–5.11)
RDW: 18.3 % — ABNORMAL HIGH (ref 11.5–15.5)
WBC: 6.5 10*3/uL (ref 4.0–10.5)
nRBC: 0 % (ref 0.0–0.2)

## 2020-03-23 LAB — COMPREHENSIVE METABOLIC PANEL
ALT: 35 U/L (ref 0–44)
AST: 23 U/L (ref 15–41)
Albumin: 3.7 g/dL (ref 3.5–5.0)
Alkaline Phosphatase: 90 U/L (ref 38–126)
Anion gap: 14 (ref 5–15)
BUN: 18 mg/dL (ref 6–20)
CO2: 37 mmol/L — ABNORMAL HIGH (ref 22–32)
Calcium: 9.2 mg/dL (ref 8.9–10.3)
Chloride: 94 mmol/L — ABNORMAL LOW (ref 98–111)
Creatinine, Ser: 0.89 mg/dL (ref 0.44–1.00)
GFR calc Af Amer: >60 mL/min (ref >60)
GFR calc non Af Amer: >60 mL/min (ref >60)
Glucose, Bld: 174 mg/dL — ABNORMAL HIGH (ref 70–99)
Potassium: 3.1 mmol/L — ABNORMAL LOW (ref 3.5–5.1)
Sodium: 145 mmol/L (ref 135–145)
Total Bilirubin: 1 mg/dL (ref 0.3–1.2)
Total Protein: 6.8 g/dL (ref 6.5–8.1)

## 2020-03-23 LAB — GLUCOSE, CAPILLARY
Glucose-Capillary: 176 mg/dL — ABNORMAL HIGH (ref 70–99)
Glucose-Capillary: 157 mg/dL — ABNORMAL HIGH (ref 70–99)
Glucose-Capillary: 115 mg/dL — ABNORMAL HIGH (ref 70–99)
Glucose-Capillary: 180 mg/dL — ABNORMAL HIGH (ref 70–99)
Glucose-Capillary: 111 mg/dL — ABNORMAL HIGH (ref 70–99)

## 2020-03-23 LAB — CULTURE, BLOOD (ROUTINE X 2)
Culture: NO GROWTH
Special Requests: ADEQUATE

## 2020-03-23 LAB — MAGNESIUM: Magnesium: 1.1 mg/dL — ABNORMAL LOW (ref 1.7–2.4)

## 2020-03-23 MED ORDER — LORAZEPAM 2 MG/ML IJ SOLN
0.5000 mg | Freq: Once | INTRAMUSCULAR | Status: AC
  Administered 2020-03-23: 0.5 mg via INTRAVENOUS
  Filled 2020-03-23: qty 1

## 2020-03-23 MED ORDER — ALPRAZOLAM 1 MG PO TABS
1.0000 mg | ORAL_TABLET | Freq: Two times a day (BID) | ORAL | Status: DC
  Administered 2020-03-23 – 2020-03-26 (×6): 1 mg via ORAL
  Filled 2020-03-23 (×6): qty 1

## 2020-03-23 MED ORDER — FENTANYL CITRATE (PF) 100 MCG/2ML IJ SOLN
50.0000 ug | INTRAMUSCULAR | Status: DC | PRN
  Administered 2020-03-24 (×4): 50 ug via INTRAVENOUS
  Filled 2020-03-23 (×5): qty 2

## 2020-03-23 MED ORDER — MAGNESIUM SULFATE 2 GM/50ML IV SOLN
2.0000 g | Freq: Once | INTRAVENOUS | Status: AC
  Administered 2020-03-23: 2 g via INTRAVENOUS
  Filled 2020-03-23: qty 50

## 2020-03-23 MED ORDER — POTASSIUM CHLORIDE 10 MEQ/100ML IV SOLN
10.0000 meq | INTRAVENOUS | Status: AC
  Administered 2020-03-23 (×4): 10 meq via INTRAVENOUS
  Filled 2020-03-23 (×4): qty 100

## 2020-03-23 MED ORDER — LORAZEPAM 1 MG PO TABS
1.0000 mg | ORAL_TABLET | Freq: Four times a day (QID) | ORAL | Status: DC | PRN
  Administered 2020-03-23 (×2): 1 mg via ORAL
  Filled 2020-03-23 (×2): qty 1

## 2020-03-23 MED ORDER — FUROSEMIDE 10 MG/ML IJ SOLN: 40.0000 mg | Freq: Every day | INTRAMUSCULAR | Status: DC

## 2020-03-23 MED ORDER — OXYBUTYNIN CHLORIDE 5 MG PO TABS
5.0000 mg | ORAL_TABLET | Freq: Two times a day (BID) | ORAL | Status: DC
  Administered 2020-03-23 – 2020-03-24 (×2): 5 mg via ORAL
  Filled 2020-03-23 (×2): qty 1

## 2020-03-23 MED ORDER — HALOPERIDOL LACTATE 5 MG/ML IJ SOLN
5.0000 mg | INTRAMUSCULAR | Status: DC | PRN
  Administered 2020-03-23: 5 mg via INTRAVENOUS
  Filled 2020-03-23: qty 1

## 2020-03-23 MED ORDER — POTASSIUM CHLORIDE CRYS ER 20 MEQ PO TBCR: 40.0000 meq | EXTENDED_RELEASE_TABLET | Freq: Every day | ORAL | Status: DC

## 2020-03-23 NOTE — Progress Notes (Signed)
Paged by bedside RN with concerns for intractable n/v. Concerned for aspiration d/t lethargy and persistent vomiting. On arrival pt is agitated and uncooperative. VS are stable. On assessment, wheezing is noted. Assessment otherwise unremarkable.   Intractable N/V - IV Zofran gtt - Continue with MIVF - Chest xray ordered to r/o aspiration pneumonia  Chest xray indicative of asp pneumonia. Will consult pharmacy to start coverage with meropenem. Hold off on bipap at this time due to severe N/V. High threshold for intubation.   Lovey Newcomer NP  [facility] 7p-7a [phone]  CRITICAL CARE Performed by: Neila Gear   Total critical care time: 30 minutes  Critical care time was exclusive of separately billable procedures and treating other patients.  Critical care was necessary to treat or prevent imminent or life-threatening deterioration.  Critical care was time spent personally by me on the following activities: development of treatment plan with patient and/or surrogate as well as nursing, discussions with consultants, evaluation of patient's response to treatment, examination of patient, obtaining history from patient or surrogate, ordering and performing treatments and interventions, ordering and review of laboratory studies, ordering and review of radiographic studies, pulse oximetry and re-evaluation of patient's condition.

## 2020-03-23 NOTE — Progress Notes (Addendum)
NAME:  Gina [name], MRN:  [id number], DOB:  [birth year], LOS: 5 ADMISSION DATE:  03/18/2020, CONSULTATION DATE:  NA REFERRING MD:  Winter, CHIEF COMPLAINT:  Septic shock   Brief History   54 year old female who presented to the ED on 8/9 w/ cc: sharp left flank pain. In ER was febrile, tachycardic, met SIRS/sepsis criteria. Initially was normotensive but became hypotensive in the ER in spite of IV crystalloid resuscitation. CT imaging showed left obstructing 59m stone w/ associated hydro. Urology was called. The patient went to the OR where she underwent cystoscopy and Left JJ stent. PCCM asked to admit  History of present illness   See above   Past Medical History  Obesity, anemia, asthma, dm type II, GERD, Fibromyalgia, kidney stones, recurrent UTIs  Significant Hospital Events   8[facility]9 [facility]  [facility]M called back for delirium, refusing to wear BiPAP with hypercarbia  Consults:  Urology   Procedures:  8/9 cystoscopy and Left JJ stent.  Significant Diagnostic Tests:  CT renal 8/9: 1. 3 x 3 x 5 mm distal left ureteral stone with mild left hydroureteronephrosis.2. Cluster of stones and/or staghorn calculus in the lower pole left kidney, similar to prior.3. Hepatomegaly with hepatic steatosis.4. Stable exophytic lesion upper pole right kidney since 24944,HQPRFFa cyst complicated by proteinaceous debris or hemorrhage.5. Aortic Atherosclerosis (ICD10-I70.0).  Micro Data:  UC 8/9 >>> e coli resistant to sulfa/amp/pip/ancef but not rocephin MRSA  PCR  8/9 Neg  COVID 19 8/9  Neg  BC x 1 8/9 >>>  PCT  03/20/20 =  11.31   Antimicrobials:  Ceftriaxone 8/9 >> 8/13 Meropenem 8/13 >>   Scheduled Meds: . (feeding supplement) PROSource Plus  30 mL Oral TID BM  . budesonide (PULMICORT) nebulizer solution  0.5 mg Nebulization BID  . Chlorhexidine Gluconate Cloth  6 each Topical Daily  . fluticasone  2 spray Each Nare Daily  . [START ON 03/24/2020] furosemide  40  mg Intravenous Daily  . heparin  5,000 Units Subcutaneous Q8H  . ibuprofen  800 mg Oral TID  . insulin aspart  0-20 Units Subcutaneous TID WC  . insulin aspart  0-5 Units Subcutaneous QHS  . insulin glargine  10 Units Subcutaneous Daily  . lactulose  20 g Oral BID  . mouth rinse  15 mL Mouth Rinse BID  . oxybutynin  5 mg Oral BID  . pantoprazole (PROTONIX) IV  40 mg Intravenous QHS  . potassium chloride  40 mEq Oral Daily  . sodium chloride flush  3 mL Intravenous Once  . tamsulosin  0.4 mg Oral Daily   Continuous Infusions: . sodium chloride Stopped (03/22/20 1833)  . meropenem (MERREM) IV 1 g (03/23/20 1338)   PRN Meds:.sodium chloride, acetaminophen, albuterol, fluticasone, LORazepam, ondansetron (ZOFRAN) IV, opium-belladonna, oxyCODONE, polyethylene glycol  Interim history/subjective:  Declined bipap/ intermittently agitated     Objective   Blood pressure (!) 180/67, pulse 77, temperature 98.4 F (36.9 C), temperature source Oral, resp. rate 18, height _0  (1.6 m), weight (!) 147.9 kg, SpO2 98 %.        Intake/Output Summary (Last 24 hours) at 03/23/2020 1702 Last data filed at 03/23/2020 1505 Gross per 24 hour  Intake 800.6 ml  Output 6900 ml  Net -6099.4 ml   Filed Weights   03/20/20 1030 03/22/20 0500 03/23/20 0500  Weight: (!) 147.5 kg (!) 147.9 kg (!) 147.9 kg    Examination: Obese restless wf yelling out sporadically  s specific complaints on 2lpm NP/ declined bipap  Tmax 99 Pt can be calmed down but then starts yelling again  No jvd Oropharynx clear,  mucosa nl Neck supple Lungs with a minimal  rhonchi bilaterally RRR no s3 or or sign murmur Abd obese with limited   excursion  Extr warm with no edema or clubbing noted      I personally reviewed images and agree with radiology impression as follows:  CXR:   8/14 New patchy and confluent right perihilar and lower lung opacity, compatible with aspiration in this setting.     Resolved Hospital  Problem list     Assessment & Plan:  Delirium, hypercarbic respiratory failure, likely has OSA/OHS Also has volume overload, new infiltrate concerning for pneumonia Encouraged to use  BiPAP though a bit agitated for this  Antibiotics as below TME appears to be greatest challenge her and agitation is  refractory to benzo's  >>>  Will try haldol/ fentanyl IV prn and change benzo to xanax as that's what she's been taking at home     Severe sepsis/septic shock secondary to urinary tract source E. coli infection Plan Improved hemodynamics. Ceftriaxone changed to meropenem per flowsheet above  >>> check cxr in am    Obstructive uropathy due to left renal calculi now status post left JJ stent Plan Further recommendations per urology  Hypomagnesemia/hypokalemia rx IV 8/14  And recheck 8/15    Acute kidney injury with lactic acidosis Lab Results  Component Value Date   CREATININE 0.89 03/23/2020   CREATININE 1.00 03/22/2020   CREATININE 1.40 (H) 03/21/2020  -Secondary to sepsis Plan Improving  Best practice:  Diet: PO Pain/Anxiety/Delirium protocol (if indicated): NNA VAP protocol (if indicated): NA DVT prophylaxis: [place] heparin GI prophylaxis: PPI Glucose control: ssi Mobility: BR Code Status: full code  Family Communication: per primary team  Disposition: SDU        The patient is critically ill with multiple organ systems failure and requires high complexity decision making for assessment and support, frequent evaluation and titration of therapies, application of advanced monitoring technologies and extensive interpretation of multiple databases. Critical Care Time devoted to patient care services described in this note is  38  minutes.    Christinia Gully, MD Pul[facility] and [place] [phone]   After 7:00 pm call Elink  [phone]

## 2020-03-23 NOTE — Progress Notes (Signed)
Pt. Continues to not wear BIPAP.  Both v60 and dream station in room.  Will continue to  Monitor.

## 2020-03-23 NOTE — Progress Notes (Signed)
PROGRESS NOTE    Gina [name]  NLG:[id number] DOB: 02/07/1966 DOA: 03/18/2020 PCP: Elenore Paddy, NP  Brief Narrative:  89 white female BMI 56, intermittent asthma, HTN, chronic pain syndrome?  IBS?  Fibromyalgia, IDDM, OSA, prior stage II-III left leg ulcer Prior exophytic right renal mass right kidney found coincidentally last admission 02/15/2017 Multifactorial anemia Prior Streptococcus galactosemia bacteremia 2018 Prior MRSA soft tissue infection of the chin 2006 Prior lysis of adhesions was BAL TSO 2006 Dr. Glennon Mac more  Admit 03/18/2020 septic shock AKI obstructive uropathy with left renal calculi status post left double-J stent-found to have obstructing 5 mm UVJ calculus plus hydronephrosis-initially required pressors and was admitted by critical care medicine was transferred to the hospitalist service on 8/10 She is remained somewhat encephalopathic despite adjustment of meds and opiates and I have asked critical care to revisit her 8/13  Assessment & Plan:   Principal Problem:   Severe sepsis with septic shock ([place]) Active Problems:   Hyperlipidemia   Type 2 diabetes mellitus with diabetic neuropathy, unspecified ([place])   Hypertension   Chronic back pain   GERD (gastroesophageal reflux disease)   Morbid obesity with BMI of 50.0-59.9, adult (HCC)   Chronic pain disorder   Peripheral neuropathy   Iron deficiency anemia   Sepsis with acute renal failure ([place])   Ureteral stone with hydronephrosis   Hyperkalemia   Acute pyelonephritis   Tobacco abuse   Wheezing   Aspiration into airway   1. Resolved septic shock secondary to obstructing 5 mm left ureteric calculus with E. coli (sensitivities are pending) a. T finish antibiotics IV given intermittent confusion etc. precluding oral intake b. Definitive management as per urology  2. Abdominal pain NOS a. Cannot get a clear history from the patient b. Plain x-ray was performed on 8/13 showing no SBO c. We will start  oxybutynin cautiously (anticholinergic effects may cause somnolence and confusion) d. Use Flomax 0.4 daily 3. Aspiration pneumonia diagnosed 8/13 a.m. a. Coughing and vomiting overnight 8/12-seems to have aspirated confirmed on CXR = aspiration  b. Continue meropenem-stop date 8/18-if reliably taking p.o. over next several days can de-escalate to Augmentin c. High risk for intubation and CCM was consulted to see her 8/13 and felt she could be managed carefully on BiPAP as needed d. will give only essential meds 4. AKI with lactic acidosis likely secondary to resolving oliguria a. Acidosis resolved b. AKI improved 5. Multifactorial delirium secondary probably to hypercarbia and multiple medications which may alter her sensorium a. ABG 8/12 PCO2 of 55 which is not surprising b. Refusing BiPAP c. Difficult situation keep on stepdown for one-to-one monitoring needs a safety sitter d. She remains full code e. We have added Ativan at a higher dose p.o. 1 mg every 6 hourly 8/14 to ensure that she does not become agitated and harm herself f. She requires a one-to-one Air cabin crew and will probably require restraints until she is more coherent but she seems to be improving 6. Iatrogenic fluid overload?  With hypoxia a. Continue Lasix 40 IV twice daily b. Weight is inaccurate -14 L however since initiation of Lasix 7. Left-sided pain a. BNO suppositories holding oxybutynin continue Flomax if able to take p.o. 8. Severe obesity BMI 56 OHSS a. As above orders 9. Hyperkalemia hypokalemia currently condary to AKI on admission a. Replace orally if possible K. Dur b. Otherwise give runs 10. Fibromyalgia pain/headaches a. Initially on fentanyl and OxyIR as well as Ativan for pain b. These were  all discontinued 8/12 secondary to significant somnolence over 48-hour. c. See MAR have resumed some of these medications d. Husband tells me she is habituated to opiates for the past 20 years at least 18. IDDM  + neuropathy + nephropathy-A1c 5.8 on 03/18/2020 a. Continue Lantus 10 units in addition to sliding scale b. Uncontrolled hyperglycemia previously now is resolved c. Sugar trends 1 15-1 80 d. Eating 100% of meals and supplements 12. Mild thrombocytopenia a. Secondary to sepsis no further work-up 13. HTN a. ACE inhibitor from admission has been discontinued b. Blood pressure trends of slightly elevated 14. Likely dilutional anemia secondary to resuscitation attempts 15. Ongoing tobacco use 16. Hyperlipidemia 17. Reflux   DVT prophylaxis: Heparin Code Status: Full Family Communication: Long discussion at the bedside x2 today 8/14   Disposition:   Status is: Inpatient  Remains inpatient appropriate because:Hemodynamically unstable, Persistent severe electrolyte disturbances and Unsafe d/c plan   Dispo: The patient is from: Home              Anticipated d/c is to: Unclear at this time              Anticipated d/c date is: 3 days              Patient currently is not medically stable to d/c.       Consultants:   Urologist  Procedures: Double-J stent placement  Antimicrobials: Ceftriaxone   Subjective: Less somnolent more agitated this morning but when I reviewed the patient in the evening 8/14 she seems to be calmer with the Ativan on board Nursing staff have been trying to reorient her Husband is present at the bedside No pains no fevers that are objective however complains of nonspecific abdominal pain  Objective: Vitals:   03/23/20 0817 03/23/20 0843 03/23/20 1255 03/23/20 1400  BP: (!) 208/65 (!) 159/104 (!) 184/93 (!) 180/67  Pulse: 85  84 77  Resp: (!) 25  18 18   Temp: 97.7 F (36.5 C)  98.4 F (36.9 C)   TempSrc: Oral  Oral   SpO2:    98%  Weight:      Height:        Intake/Output Summary (Last 24 hours) at 03/23/2020 1632 Last data filed at 03/23/2020 1505 Gross per 24 hour  Intake 800.6 ml  Output 6900 ml  Net -6099.4 ml   Filed Weights    03/20/20 1030 03/22/20 0500 03/23/20 0500  Weight: (!) 147.5 kg (!) 147.9 kg (!) 147.9 kg    Examination: Obese incoherent writhing in the bed Respiratory system: No rales no rhonchi  Cardiovascular system: S1-S2 no murmur rub or gallop Gastrointestinal system: Obese poor exam Central nervous system: Moves all 4 limbs equally Extremities: None--- has some ichthyosis Skin: Stage I lower extremity edema Psychiatry: Agitated  Data Reviewed: I have personally reviewed following labs and imaging studies Potassium 6.1-->4.9-->4.4-->3.1 Bicarb 27-->37 BUN/creatinine peak of 26/2.0-->33/1.5-->30/1.0-->18/0.8 Procalcitonin peak of 23-->11.3 White count 14.4--> 5.0-->6.5 Platelet count 151--> 142-->174 Radiology Studies:--> DG CHEST PORT 1 VIEW  Result Date: 03/22/2020 CLINICAL DATA:  54 year old female possible aspiration. EXAM: PORTABLE CHEST 1 VIEW COMPARISON:  Portable chest 03/19/2020 and earlier. FINDINGS: Portable AP semi upright view at 0218 hours. Increased kyphotic positioning. Mediastinal contours appear stable. The patient's head obscures some of the lung apices. New Patchy and confluent right perihilar and lower lung opacity. Questionable mild increased left perihilar opacity. No pneumothorax. No pleural effusion is evident. Stable visualized osseous structures. IMPRESSION: New patchy and confluent right  perihilar and lower lung opacity, compatible with aspiration in this setting. Electronically Signed   By: Genevie Ann M.D.   On: 03/22/2020 02:34   DG Abd 2 Views  Result Date: 03/22/2020 CLINICAL DATA:  54 year old female with concern for small bowel obstruction EXAM: ABDOMEN - 2 VIEW COMPARISON:  Abdominal radiograph dated 11/09/2019 FINDINGS: There is no bowel dilatation or evidence of obstruction. No free air. Several stones noted over the inferior left renal silhouette. A pigtail left ureteral stent is in place. Cholecystectomy clips. The osseous structures and soft tissues are  unremarkable. IMPRESSION: 1. No evidence of obstruction. 2. Left renal calculi and left ureteral stent. Electronically Signed   By: Anner Crete M.D.   On: 03/22/2020 17:53     Scheduled Meds: . (feeding supplement) PROSource Plus  30 mL Oral TID BM  . budesonide (PULMICORT) nebulizer solution  0.5 mg Nebulization BID  . Chlorhexidine Gluconate Cloth  6 each Topical Daily  . fluticasone  2 spray Each Nare Daily  . furosemide  40 mg Intravenous Q12H  . heparin  5,000 Units Subcutaneous Q8H  . ibuprofen  800 mg Oral TID  . insulin aspart  0-20 Units Subcutaneous TID WC  . insulin aspart  0-5 Units Subcutaneous QHS  . insulin glargine  10 Units Subcutaneous Daily  . lactulose  20 g Oral BID  . mouth rinse  15 mL Mouth Rinse BID  . pantoprazole (PROTONIX) IV  40 mg Intravenous QHS  . sodium chloride flush  3 mL Intravenous Once  . tamsulosin  0.4 mg Oral Daily   Continuous Infusions: . sodium chloride Stopped (03/22/20 1833)  . meropenem (MERREM) IV 1 g (03/23/20 1338)     LOS: 5 days    Time spent: [place], MD [facility] To contact the attending provider between 7A-7P or the covering provider during after hours 7P-7A, please log into the web site [email or web address] and access using universal [place] password for that web site. If you do not have the password, please call the hospital operator.  03/23/2020, 4:32 PM

## 2020-03-23 NOTE — Progress Notes (Signed)
Pt has become increasingly agitated and restless. Pt has not slept all night as well as is refusing to keep [place] on. Pt has also started biting at staff on call paged awaiting new orders if any.

## 2020-03-24 ENCOUNTER — Inpatient Hospital Stay (HOSPITAL_COMMUNITY): Payer: Medicare Other

## 2020-03-24 LAB — CBC WITH DIFFERENTIAL/PLATELET
Abs Immature Granulocytes: 0.05 10*3/uL (ref 0.00–0.07)
Basophils Absolute: 0 10*3/uL (ref 0.0–0.1)
Basophils Relative: 1 %
Eosinophils Absolute: 0.3 10*3/uL (ref 0.0–0.5)
Eosinophils Relative: 5 %
HCT: 38.6 % (ref 36.0–46.0)
Hemoglobin: 11.5 g/dL — ABNORMAL LOW (ref 12.0–15.0)
Immature Granulocytes: 1 %
Lymphocytes Relative: 18 %
Lymphs Abs: 1.1 10*3/uL (ref 0.7–4.0)
MCH: 25.1 pg — ABNORMAL LOW (ref 26.0–34.0)
MCHC: 29.8 g/dL — ABNORMAL LOW (ref 30.0–36.0)
MCV: 84.3 fL (ref 80.0–100.0)
Monocytes Absolute: 0.4 10*3/uL (ref 0.1–1.0)
Monocytes Relative: 6 %
Neutro Abs: 4.3 10*3/uL (ref 1.7–7.7)
Neutrophils Relative %: 69 %
Platelets: 159 10*3/uL (ref 150–400)
RBC: 4.58 MIL/uL (ref 3.87–5.11)
RDW: 18.3 % — ABNORMAL HIGH (ref 11.5–15.5)
WBC: 6.1 10*3/uL (ref 4.0–10.5)
nRBC: 0 % (ref 0.0–0.2)

## 2020-03-24 LAB — BASIC METABOLIC PANEL
Anion gap: 14 (ref 5–15)
BUN: 16 mg/dL (ref 6–20)
CO2: 37 mmol/L — ABNORMAL HIGH (ref 22–32)
Calcium: 8.7 mg/dL — ABNORMAL LOW (ref 8.9–10.3)
Chloride: 92 mmol/L — ABNORMAL LOW (ref 98–111)
Creatinine, Ser: 0.87 mg/dL (ref 0.44–1.00)
GFR calc Af Amer: >60 mL/min (ref >60)
GFR calc non Af Amer: >60 mL/min (ref >60)
Glucose, Bld: 138 mg/dL — ABNORMAL HIGH (ref 70–99)
Potassium: 3.5 mmol/L (ref 3.5–5.1)
Sodium: 143 mmol/L (ref 135–145)

## 2020-03-24 LAB — GLUCOSE, CAPILLARY
Glucose-Capillary: 165 mg/dL — ABNORMAL HIGH (ref 70–99)
Glucose-Capillary: 118 mg/dL — ABNORMAL HIGH (ref 70–99)
Glucose-Capillary: 172 mg/dL — ABNORMAL HIGH (ref 70–99)
Glucose-Capillary: 190 mg/dL — ABNORMAL HIGH (ref 70–99)

## 2020-03-24 LAB — SEDIMENTATION RATE: Sed Rate: 30 mm/hr — ABNORMAL HIGH (ref 0–22)

## 2020-03-24 LAB — BRAIN NATRIURETIC PEPTIDE: B Natriuretic Peptide: 72.3 pg/mL (ref 0.0–100.0)

## 2020-03-24 LAB — MAGNESIUM: Magnesium: 1.8 mg/dL (ref 1.7–2.4)

## 2020-03-24 MED ORDER — OXYBUTYNIN CHLORIDE 5 MG PO TABS
10.0000 mg | ORAL_TABLET | Freq: Three times a day (TID) | ORAL | Status: DC
  Administered 2020-03-24 – 2020-03-26 (×6): 10 mg via ORAL
  Filled 2020-03-24 (×6): qty 2

## 2020-03-24 MED ORDER — FENTANYL CITRATE (PF) 100 MCG/2ML IJ SOLN
12.5000 ug | INTRAMUSCULAR | Status: DC | PRN
  Administered 2020-03-24: 12.5 ug via INTRAVENOUS
  Filled 2020-03-24: qty 2

## 2020-03-24 MED ORDER — OXYCODONE HCL 5 MG PO TABS
5.0000 mg | ORAL_TABLET | Freq: Three times a day (TID) | ORAL | Status: DC | PRN
  Administered 2020-03-24 – 2020-03-26 (×5): 5 mg via ORAL
  Filled 2020-03-24 (×5): qty 1

## 2020-03-24 NOTE — Progress Notes (Addendum)
PROGRESS NOTE    Gina [name]  YWV:[id number] DOB: 09-19-1965 DOA: 03/18/2020 PCP: Elenore Paddy, NP  Brief Narrative:  77 white female BMI 56, intermittent asthma, HTN, chronic pain syndrome?  IBS?  Fibromyalgia, IDDM, OSA, prior stage II-III left leg ulcer Prior exophytic right renal mass right kidney found coincidentally last admission 02/15/2017 Multifactorial anemia Prior Streptococcus galactosemia bacteremia 2018 Prior MRSA soft tissue infection of the chin 2006 Prior lysis of adhesions was BAL TSO 2006 Dr. Glennon Mac more  Admit 03/18/2020 septic shock AKI obstructive uropathy with left renal calculi status post left double-J stent-found to have obstructing 5 mm UVJ calculus plus hydronephrosis-initially required pressors and was admitted by critical care medicine was transferred to the hospitalist service on 8/10 She is remained somewhat encephalopathic despite adjustment of meds and opiates and I have asked critical care to revisit her 8/13  Assessment & Plan:   Principal Problem:   Severe sepsis with septic shock ([place]) Active Problems:   Hyperlipidemia   Type 2 diabetes mellitus with diabetic neuropathy, unspecified ([place])   Hypertension   Chronic back pain   GERD (gastroesophageal reflux disease)   Morbid obesity with BMI of 50.0-59.9, adult (HCC)   Chronic pain disorder   Peripheral neuropathy   Iron deficiency anemia   Sepsis with acute renal failure ([place])   Ureteral stone with hydronephrosis   Hyperkalemia   Acute pyelonephritis   Tobacco abuse   Wheezing   Aspiration into airway   Acute respiratory failure with hypoxia and hypercarbia ([place])   1. Multifactorial delirium secondary probably to hypercarbia and multiple medications which may alter her sensorium a. ABG 8/12 PCO2 of 55 which is not surprising b. Refusing BiPAP c. Difficult situation keep on stepdown 1 more day with one-to-one monitoring i. Patient was resumed on Xanax 1 mg as needed anxiety with good  effect on 8/14 ii. Haldol is in place in case patient gets agitated as well and we will taper as needed d.  Meds adjusted and tailored with significant improvement of affect and delirium therefore we will try to i. Taper and discontinue fentanyl by 8/16 for severe pain ii. Give as first choice for pain ibuprofen 800 3 times daily iii. Second choice for pain oxycodone from 5 every 8 as needed  2. Resolved septic shock secondary to obstructing 5 mm left ureteric calculus with E. coli (sensitivities are pending) a. T finish antibiotics IV given intermittent confusion etc. precluding oral intake b. Definitive management as per urology  3. Abdominal pain NOS a. Likely stent colic b. Increasing oxybutynin to 10 mg 3 times daily 8/15 c. Use Flomax 0.4 daily d. Liberal use of belladonna suppositories every 6 as needed spasm 4. Aspiration pneumonia diagnosed 8/13 a.m. a. Coughing and vomiting overnight 8/12-seems to have aspirated confirmed on CXR = aspiration  b. Continue meropenem-stop date 8/18-if reliably taking p.o. in a.m. de-escalate to Augmentin c. High risk for respiratory compromise-appreciate CCM input-seems to be stabilizing and may be able to transfer to floor in a.m. d. X-ray from 8/15 shows improving aeration per my overview e. will give only essential meds but may be able to escalate on 8/16 back to her regular meds 5. AKI with lactic acidosis likely secondary to resolving oliguria a. Acidosis resolved b. AKI improved 6. Iatrogenic fluid overload?  With hypoxia a. Continue Lasix 40 IV twice daily b. Weight is inaccurate -15 L however since initiation of Lasix 7. Severe obesity BMI 56 OHSS a. As above orders 8. Hyperkalemia  hypokalemia currently condary to AKI on admission a. Replace orally if possible K. Dur b. Otherwise give runs 9. Fibromyalgia pain/headaches a. Initially on fentanyl and OxyIR as well as Ativan for pain b. These were all discontinued 8/12 secondary to  significant somnolence over 48-hour. c. Husband tells me she is habituated to opiates for the past 20 years at least 10. IDDM + neuropathy + nephropathy-A1c 5.8 on 03/18/2020 a. Continue Lantus 10 units in addition to sliding scale b. Uncontrolled hyperglycemia previously now is resolved 11. Mild thrombocytopenia a. Secondary to sepsis no further work-up 12. HTN a. ACE inhibitor from admission has been discontinued b. Blood pressure trends of slightly elevated 13. Likely dilutional anemia secondary to resuscitation attempts 14. Ongoing tobacco use 15. Hyperlipidemia 16. Reflux   DVT prophylaxis: Heparin Code Status: Full Family Communication: Long discussion at the bedside with husband 8:15 AM   Disposition:   Status is: Inpatient  Remains inpatient appropriate because:Hemodynamically unstable, Persistent severe electrolyte disturbances and Unsafe d/c plan   Dispo: The patient is from: Home              Anticipated d/c is to: Unclear at this time              Anticipated d/c date is: 3 days              Patient currently is not medically stable to d/c.    Consultants:   Urologist  Procedures: Double-J stent placement  Antimicrobials: Ceftriaxone   Subjective: Coherent alert some left-sided pain Much more alert and oriented and seems to be her normal self can recall and relate who her husband is give his name etc. etc.  Objective: Vitals:   03/24/20 1153 03/24/20 1200 03/24/20 1300 03/24/20 1608  BP:      Pulse:  72 72   Resp:  19 18   Temp: 97.9 F (36.6 C)   98.2 F (36.8 C)  TempSrc: Oral   Oral  SpO2:  96% 96%   Weight:      Height:        Intake/Output Summary (Last 24 hours) at 03/24/2020 1642 Last data filed at 03/24/2020 0800 Gross per 24 hour  Intake 278.53 ml  Output 1000 ml  Net -721.47 ml   Filed Weights   03/20/20 1030 03/22/20 0500 03/23/20 0500  Weight: (!) 147.5 kg (!) 147.9 kg (!) 147.9 kg    Examination: Comfortable pleasant no  distress EOMI NCAT no focal deficit EOMI NCAT neck soft supple Chest clear no added sound Abdomen slightly tender on the left side greater than the right side no rebound no guarding Neurologically grossly intact much more coherent Psych euthymic  Data Reviewed: I have personally reviewed following labs and imaging studies Potassium 6.1-->4.9-->4.4-->3.1-->3.5 Bicarb 27-->37-->37 BUN/creatinine peak of 26/2.0-->33/1.5-->30/1.0-->18/0.8-->16/0.8 Procalcitonin peak of 23-->11.3 White count 14.4--> 5.0-->6.5-->6.1 Platelet count 151--> 142-->174--->159 Radiology Studies:--> DG Chest Port 1 View  Result Date: 03/24/2020 CLINICAL DATA:  Shortness of breath EXAM: PORTABLE CHEST 1 VIEW COMPARISON:  03/22/2020 FINDINGS: Cardiac shadow remains enlarged. Lungs are well aerated bilaterally. Mild central vascular congestion is noted. The perihilar density seen previously has resolved and was likely atelectatic in nature. No pneumothorax is seen. No bony abnormality is noted. IMPRESSION: Improved aeration as described. Electronically Signed   By: Inez Catalina M.D.   On: 03/24/2020 04:47   DG Abd 2 Views  Result Date: 03/22/2020 CLINICAL DATA:  54 year old female with concern for small bowel obstruction EXAM: ABDOMEN - 2 VIEW  COMPARISON:  Abdominal radiograph dated 11/09/2019 FINDINGS: There is no bowel dilatation or evidence of obstruction. No free air. Several stones noted over the inferior left renal silhouette. A pigtail left ureteral stent is in place. Cholecystectomy clips. The osseous structures and soft tissues are unremarkable. IMPRESSION: 1. No evidence of obstruction. 2. Left renal calculi and left ureteral stent. Electronically Signed   By: Anner Crete M.D.   On: 03/22/2020 17:53     Scheduled Meds: . (feeding supplement) PROSource Plus  30 mL Oral TID BM  . ALPRAZolam  1 mg Oral BID  . budesonide (PULMICORT) nebulizer solution  0.5 mg Nebulization BID  . Chlorhexidine Gluconate Cloth   6 each Topical Daily  . fluticasone  2 spray Each Nare Daily  . heparin  5,000 Units Subcutaneous Q8H  . ibuprofen  800 mg Oral TID  . insulin aspart  0-20 Units Subcutaneous TID WC  . insulin aspart  0-5 Units Subcutaneous QHS  . insulin glargine  10 Units Subcutaneous Daily  . lactulose  20 g Oral BID  . mouth rinse  15 mL Mouth Rinse BID  . oxybutynin  10 mg Oral TID  . pantoprazole (PROTONIX) IV  40 mg Intravenous QHS  . sodium chloride flush  3 mL Intravenous Once  . tamsulosin  0.4 mg Oral Daily   Continuous Infusions: . sodium chloride Stopped (03/22/20 1833)  . meropenem (MERREM) IV Stopped (03/24/20 1623)     LOS: 6 days    Time spent: [place], MD [facility] To contact the attending provider between 7A-7P or the covering provider during after hours 7P-7A, please log into the web site [email or web address] and access using universal [place] password for that web site. If you do not have the password, please call the hospital operator.  03/24/2020, 4:42 PM

## 2020-03-24 NOTE — Progress Notes (Signed)
At [place], restraints were removed from patient.  Patient is alert and promised to remain in bed.  This RN explained if she tried to get out of bed or pull at her IV, the restraints would have to be put on again.  Patient repositioned herself and is resting comfortably.

## 2020-03-25 DIAGNOSIS — R6521 Severe sepsis with septic shock: Secondary | ICD-10-CM | POA: Diagnosis not present

## 2020-03-25 DIAGNOSIS — A419 Sepsis, unspecified organism: Secondary | ICD-10-CM | POA: Diagnosis not present

## 2020-03-25 LAB — CBC WITH DIFFERENTIAL/PLATELET
Abs Immature Granulocytes: 0.02 10*3/uL (ref 0.00–0.07)
Basophils Absolute: 0 10*3/uL (ref 0.0–0.1)
Basophils Relative: 1 %
Eosinophils Absolute: 0.4 10*3/uL (ref 0.0–0.5)
Eosinophils Relative: 8 %
HCT: 40.1 % (ref 36.0–46.0)
Hemoglobin: 12 g/dL (ref 12.0–15.0)
Immature Granulocytes: 1 %
Lymphocytes Relative: 22 %
Lymphs Abs: 1 10*3/uL (ref 0.7–4.0)
MCH: 25.3 pg — ABNORMAL LOW (ref 26.0–34.0)
MCHC: 29.9 g/dL — ABNORMAL LOW (ref 30.0–36.0)
MCV: 84.6 fL (ref 80.0–100.0)
Monocytes Absolute: 0.3 10*3/uL (ref 0.1–1.0)
Monocytes Relative: 7 %
Neutro Abs: 2.7 10*3/uL (ref 1.7–7.7)
Neutrophils Relative %: 61 %
Platelets: 167 10*3/uL (ref 150–400)
RBC: 4.74 MIL/uL (ref 3.87–5.11)
RDW: 18.3 % — ABNORMAL HIGH (ref 11.5–15.5)
WBC: 4.4 10*3/uL (ref 4.0–10.5)
nRBC: 0 % (ref 0.0–0.2)

## 2020-03-25 LAB — BASIC METABOLIC PANEL
Anion gap: 11 (ref 5–15)
BUN: 20 mg/dL (ref 6–20)
CO2: 39 mmol/L — ABNORMAL HIGH (ref 22–32)
Calcium: 8.6 mg/dL — ABNORMAL LOW (ref 8.9–10.3)
Chloride: 90 mmol/L — ABNORMAL LOW (ref 98–111)
Creatinine, Ser: 1.04 mg/dL — ABNORMAL HIGH (ref 0.44–1.00)
GFR calc Af Amer: >60 mL/min (ref >60)
GFR calc non Af Amer: >60 mL/min (ref >60)
Glucose, Bld: 140 mg/dL — ABNORMAL HIGH (ref 70–99)
Potassium: 3.2 mmol/L — ABNORMAL LOW (ref 3.5–5.1)
Sodium: 140 mmol/L (ref 135–145)

## 2020-03-25 LAB — GLUCOSE, CAPILLARY
Glucose-Capillary: 143 mg/dL — ABNORMAL HIGH (ref 70–99)
Glucose-Capillary: 150 mg/dL — ABNORMAL HIGH (ref 70–99)
Glucose-Capillary: 135 mg/dL — ABNORMAL HIGH (ref 70–99)
Glucose-Capillary: 132 mg/dL — ABNORMAL HIGH (ref 70–99)

## 2020-03-25 MED ORDER — ONDANSETRON 4 MG PO TBDP
8.0000 mg | ORAL_TABLET | Freq: Two times a day (BID) | ORAL | Status: DC
  Administered 2020-03-25 – 2020-03-26 (×3): 8 mg via ORAL
  Filled 2020-03-25 (×3): qty 2

## 2020-03-25 MED ORDER — POTASSIUM CHLORIDE CRYS ER 20 MEQ PO TBCR
40.0000 meq | EXTENDED_RELEASE_TABLET | Freq: Two times a day (BID) | ORAL | Status: DC
  Administered 2020-03-25 – 2020-03-26 (×2): 40 meq via ORAL
  Filled 2020-03-25 (×2): qty 2

## 2020-03-25 MED ORDER — CEFDINIR 300 MG PO CAPS
300.0000 mg | ORAL_CAPSULE | Freq: Two times a day (BID) | ORAL | Status: DC
  Administered 2020-03-25 – 2020-03-26 (×3): 300 mg via ORAL
  Filled 2020-03-25 (×3): qty 1

## 2020-03-25 MED ORDER — POTASSIUM CHLORIDE CRYS ER 20 MEQ PO TBCR
20.0000 meq | EXTENDED_RELEASE_TABLET | ORAL | Status: DC
  Administered 2020-03-25: 20 meq via ORAL
  Filled 2020-03-25: qty 1

## 2020-03-25 MED ORDER — BELLADONNA ALKALOIDS-OPIUM 16.2-60 MG RE SUPP
1.0000 | Freq: Four times a day (QID) | RECTAL | Status: DC
  Administered 2020-03-25 – 2020-03-26 (×3): 1 via RECTAL
  Filled 2020-03-25 (×3): qty 1

## 2020-03-25 NOTE — Evaluation (Signed)
Occupational Therapy Evaluation and Discharge Patient Details Name: Gina [name] MRN: [id number] DOB: May 03, 1966 Today's Date: 03/25/2020    History of Present Illness 54 year old female who presented to the ED on 8/9 w/ c/o: sharp left flank pain. In ER was febrile, tachycardic, met SIRS/sepsis criteria. Initially was normotensive but became hypotensive in the ER. CT imaging showed left obstructing 83m stone w/ associated hydro. Urology was called. The patient went to the OR where she underwent cystoscopy and Left JJ stent and she remained somewhat encephalopathic despite adjustment of meds and opiates post surgery but delirium has now resolved. PHMx:  BMI 56, intermittent asthma, HTN, chronic pain syndrome?  IBS?  Fibromyalgia, IDDM, OSA, prior stage II-III left leg ulcer, prior exophytic right renal mass right kidney, multifactorial anemia   Clinical Impression   This 54yo female admitted with above presents to acute OT at an overall S level for basic ADLs. Pt has S at home from family, no further OT needs, we will sign off.    Follow Up Recommendations  No OT follow up          Precautions / Restrictions Precautions Precautions: None Restrictions Weight Bearing Restrictions: No      Mobility Bed Mobility               General bed mobility comments: Pt up in recliner upon arrival  Transfers Overall transfer level: Needs assistance Equipment used: None Transfers: Sit to/from Stand Sit to Stand: Supervision         General transfer comment: Ambulation S on unit without AD    Balance Overall balance assessment: No apparent balance deficits (not formally assessed)                                         ADL either performed or assessed with clinical judgement   ADL                                         General ADL Comments: Overall at a S level     Vision Patient Visual Report: No change from baseline               Pertinent Vitals/Pain Pain Assessment: No/denies pain     Hand Dominance Right   Extremity/Trunk Assessment Upper Extremity Assessment Upper Extremity Assessment: Overall WFL for tasks assessed   Lower Extremity Assessment Lower Extremity Assessment: Overall WFL for tasks assessed       Communication Communication Communication: No difficulties   Cognition Arousal/Alertness: Awake/alert Behavior During Therapy: WFL for tasks assessed/performed Overall Cognitive Status: Within Functional Limits for tasks assessed                                                Home Living Family/patient expects to be discharged to:: Private residence Living Arrangements: Spouse/significant other;Children Available Help at Discharge: Family;Available 24 hours/day Type of Home: House Home Access: Stairs to enter [employer] Steps: 6 Entrance Stairs-Rails: Right;Left;Can reach both Home Layout: One level     Bathroom Shower/Tub: Walk-in shower;Door;Curtain   Bathroom Toilet: Handicapped height     Home Equipment: WEnvironmental consultant- 2 wheels;Shower  seat          Prior Functioning/Environment Level of Independence: Independent                          OT Goals(Current goals can be found in the care plan section) Acute Rehab OT Goals Patient Stated Goal: to go home soon  OT Frequency:             Co-evaluation PT/OT/SLP Co-Evaluation/Treatment: Yes Reason for Co-Treatment: For patient/therapist safety;To address functional/ADL transfers PT goals addressed during session: Strengthening/ROM;Balance;Mobility/safety with mobility OT goals addressed during session: ADL's and self-care;Strengthening/ROM      AM-PAC OT "6 Clicks" Daily Activity     Outcome Measure Help from another person eating meals?: None Help from another person taking care of personal grooming?: None Help from another person toileting, which includes using toliet, bedpan, or  urinal?: None Help from another person bathing (including washing, rinsing, drying)?: None Help from another person to put on and taking off regular upper body clothing?: None Help from another person to put on and taking off regular lower body clothing?: None 6 Click Score: 24   End of Session Nurse Communication:  (RN saw pt ambulating in hallway)  Activity Tolerance: Patient tolerated treatment well Patient left: in chair;with call bell/phone within reach  OT Visit Diagnosis: Muscle weakness (generalized) (M62.81)                Time: 3299-2426 OT Time Calculation (min): 15 min Charges:  OT General Charges $OT Visit: 1 Visit OT Evaluation $OT Eval Moderate Complexity: 1 Mod  [place], OTR/L Acute [employer] Pager [phone] Office [phone]     Almon Register 03/25/2020, 2:37 PM

## 2020-03-25 NOTE — Progress Notes (Signed)
  Speech Language Pathology Treatment: Dysphagia  Patient Details Name: Gina [name] MRN: [id number] DOB: 04/04/1966 Today's Date: 03/25/2020 Time: 1700-1749 SLP Time Calculation (min) (ACUTE ONLY): 21 min  Assessment / Plan / Recommendation Clinical Impression  Upon entrance into pt's rom, she reports nausea.  Agreeable to consume crackers and water to determine if may diminish nausea. Pt eating and drinking slowly without cues.  No indication of aspiration with all po observed.  Voice appears mildly hoarse and pt attributes this to fan use.  Issues with nausea currently and states she was to receive Zofran - RN advised she will bring her Zofran soon.    Pt denies dysphagia, states swallowing ability is normal at this time. SLP reviewed importance of reflux precautions given pt is on a PPI and has premorbid reflux.  Pt's acute swallow dysfunction due to her ? delirum has resolved. No SLP follow up indicated.  Thanks for allowing me to help care for this pt.    HPI HPI: 54 yo female with h/o fibromyalgia, GERD, HLD, asthma, DM, anxiety, anemia, chronic pain and headache adm on 8/9 with sharp left flank pain.  She was diagnosed with sepsis/SIRS.  CT imaging showed ostructucting stone ont he left with hydronephrosis.  She is now s/p cystoscopy with left JJ stent on 8/9.  Post-op, pt with worsening AMS and hypercarbic.  CXR showed new patchy and confluent right perihilar and lower lung opacity compatible with aspiration in this setting. MD stopped all medications that would contribute to AMS.  Swallow eval ordered.      SLP Plan  All goals met       Recommendations  Diet recommendations: Regular;Thin liquid Liquids provided via: Cup;Straw Medication Administration: Whole meds with puree Compensations: Slow rate;Small sips/bites Postural Changes and/or Swallow Maneuvers: Seated upright 90 degrees;Upright 30-60 min after meal                Oral Care Recommendations: Oral care  BID Follow up Recommendations: None SLP Visit Diagnosis: Dysphagia, unspecified (R13.10) Plan: All goals met       GO                Macario Golds 03/25/2020, 11:36 AM  Kathleen Lime, MS [place] Office [phone]

## 2020-03-25 NOTE — Progress Notes (Addendum)
NAME:  Gina [name], MRN:  [id number], DOB:  [birth year], LOS: 7 ADMISSION DATE:  03/18/2020, CONSULTATION DATE:  NA REFERRING MD:  Winter, CHIEF COMPLAINT:  Septic shock   Brief History   54 year old female who presented to the ED on 8/9 w/ cc: sharp left flank pain. In ER was febrile, tachycardic, met SIRS/sepsis criteria. Initially was normotensive but became hypotensive in the ER in spite of IV crystalloid resuscitation. CT imaging showed left obstructing 98m stone w/ associated hydro. Urology was called. The patient went to the OR where she underwent cystoscopy and Left JJ stent. PCCM asked to admit.  Urine cultures grew out E-Coli.  Shock improved.  [facility]  Course complicated by agitated delirium.   Past Medical History  Obesity, anemia, asthma, dm type II, GERD, Fibromyalgia, kidney stones, recurrent UTIs  Significant Hospital Events   8/09 admitted  [facility] PCCM called back for delirium, refusing to wear BiPAP with hypercarbia  Consults:  Urology   Procedures:    Significant Diagnostic Tests:  CT renal 8/9 >> 1. 3 x 3 x 5 mm distal left ureteral stone with mild left hydroureteronephrosis.  Cluster of stones and/or staghorn calculus in the lower pole left kidney, similar to prior.  Hepatomegaly with hepatic steatosis.  Stable exophytic lesion upper pole right kidney since 2012, likely a cyst complicated by proteinaceous debris or hemorrhage. Aortic Atherosclerosis. Cystoscopy and Left JJ stent 8/9  Micro Data:  COVID 8/9 >> negative  UC 8/9 >> e coli resistant to sulfa/amp/pip/ancef but not rocephin MRSA PCR 8/9 >> negative  COVID 19 8/9 >> negative  BC x1 8/9 >> negative  PCT 8/10 23.1 >> 8/11 11.31     Antimicrobials:  Ceftriaxone 8/9 >> 8/13 Meropenem 8/13 >> 8/16 Cefdinir 8/16 >>   Interim history/subjective:  On 2L Did not wear bipap last night > states she does not have one at home and had a prior negative sleep  study Tmax 97.8 / WBC 4.4  I/O 3.5 L UOP, -2.7L in last 24 hours   Objective   Blood pressure (!) 122/58, pulse 65, temperature 97.8 F (36.6 C), temperature source Oral, resp. rate 14, height _0  (1.6 m), weight (!) 147.9 kg, SpO2 94 %.        Intake/Output Summary (Last 24 hours) at 03/25/2020 0819 Last data filed at 03/24/2020 1800 Gross per 24 hour  Intake 97.12 ml  Output 500 ml  Net -402.88 ml   Filed Weights   03/20/20 1030 03/22/20 0500 03/23/20 0500  Weight: (!) 147.5 kg (!) 147.9 kg (!) 147.9 kg    Examination: General: adult female sitting up in chair in NAD HEENT: MM pink/moist, on RA currently, anicteric Neuro: AAOx4, speech clear, MAE  CV: s1s2 rrr, no m/r/g PULM:  Non-labored on RA ([place] off), lungs bilaterally with good air movement, clear  GI: soft, bsx4 active  Extremities: warm/dry, no edema  Skin: no rashes or lesions  Resolved Hospital Problem list     Assessment & Plan:   Agitated Delirium Improved with addition of home xanax  -management per primary SVC  -continue home xanax -PRN oxycodone for pain  -delirium prevention measures   Acute on Chronic Hypercarbic Respiratory Failure, suspected OSA/OHS Volume Overload New infiltrate concerning for Pneumonia vs Atelectasis  -[place] O2 -QHS bipap use and PRN daytime sleep if patient will wear -suspect she will need follow up regarding sleep as outpatient   Severe Sepsis /  Septic Shock secondary to Urinary Source / Obstructive Uropathy  E. coli infection -continue abx as above   Obstructive uropathy due to left renal calculi now status post left JJ stent -Per Urology  -continue flomax, B&O suppository  -trial catheter clamping per Urology   Hypomagnesemia Hypokalemia -follow labs, replace as indicated   Acute kidney injury with lactic acidosis Secondary to sepsis -Trend BMP / urinary output -Replace electrolytes as indicated -Avoid nephrotoxic agents, ensure adequate renal  perfusion -caution with NSAID's for pain, monitor sr cr closely    Best practice:  Diet: PO Pain/Anxiety/Delirium protocol (if indicated): NA VAP protocol (if indicated): NA DVT prophylaxis: [place] heparin GI prophylaxis: PPI Glucose control: ssi Mobility: BR Code Status: full code  Family Communication: per primary team  Disposition: SDU   PCCM will be available PRN.  Please call back if new needs arise.   Noe Gens, MSN, NP-C [facility] 03/25/2020, 8:20 AM   Please see [email or web address] for pager details.

## 2020-03-25 NOTE — Progress Notes (Addendum)
PROGRESS NOTE    Gina [name]  NTZ:[id number] DOB: January 17, 1966 DOA: 03/18/2020 PCP: Elenore Paddy, NP  Brief Narrative:   11 white female BMI 56, intermittent asthma, HTN, chronic pain syndrome?  IBS?  Fibromyalgia, IDDM, OSA, prior stage II-III left leg ulcer Prior exophytic right renal mass right kidney found coincidentally last admission 02/15/2017 Multifactorial anemia Prior Streptococcus galactosemia bacteremia 2018 Prior MRSA soft tissue infection of the chin 2006 Prior lysis of adhesions was BAL TSO 2006 Dr. Glennon Mac More  Admit 03/18/2020 septic shock AKI obstructive uropathy with left renal calculi status post left double-J stent-found to have obstructing 5 mm UVJ calculus plus hydronephrosis-initially required pressors and was admitted by critical care medicine was transferred to the hospitalist service on 8/10 Patient came encephalopathic 8/10 likely a combination of hypercarbia polypharmacy in the setting of hypercarbia -critical care revisited her and her delirium resolved as of 8/14 8/15 with reinitiation of some of her anxiolytics and other meds She was transferred to floor on 8/16 She is nearing d/c  Assessment & Plan:   Principal Problem:   Severe sepsis with septic shock ([place]) Active Problems:   Hyperlipidemia   Type 2 diabetes mellitus with diabetic neuropathy, unspecified ([place])   Hypertension   Chronic back pain   GERD (gastroesophageal reflux disease)   Morbid obesity with BMI of 50.0-59.9, adult (HCC)   Chronic pain disorder   Peripheral neuropathy   Iron deficiency anemia   Sepsis with acute renal failure ([place])   Ureteral stone with hydronephrosis   Hyperkalemia   Acute pyelonephritis   Tobacco abuse   Wheezing   Aspiration into airway   Acute respiratory failure with hypoxia and hypercarbia ([place])   1. Multifactorial delirium 2/2 hypercarbia, polypharmacy a. ABG 8/12 PCO2 of 55 which is not surprisingRefusing BiPAP b. Stable today for transfer to  floor i. Patient was resumed on Xanax 1 mg as needed anxiety with good effect on 8/14 ii. Haldol is in place in case patient gets agitated - taper as needed c.  Meds adjusted and tailored with significant improvement of affect and delirium therefore we will try to i. Taper and discontinue fentanyl by 8/16 for severe pain ii. Give as first choice for pain ibuprofen 800 3 times daily iii. Second choice for pain oxycodone from 5 every 8 as needed iv. I have clearly discussed with the patient contact her pain clinic off of [place] to get refills as we will NOT BE PRESCRIBING controlled substances meds on discharge 2. Resolved septic shock secondary to obstructing 5 mm left ureteric calculus with E. coli pansen a. As above antibiotics IV given intermittent confusion etc. precluding oral intake b. Definitive management as per urology \ 3. Aspiration pneumonia diagnosed 8/13 a.m. a. Coughing and vomiting overnight 8/12-seems to have aspirated confirmed on CXR = aspiration  b. Was escalated to meropenem "->currently to cephalosporin 18 complete on 8/ c. X-ray from 8/15 shows improving aeration per my overview 4. AKI with lactic acidosis likely secondary to resolving oliguria  5. Abdominal pain NOS a. Likely stent colic-she has not been getting her BNO suppository scheduled so I have changed it to every 6 scheduled today to see how she does b. Increasing oxybutynin to 10 mg 3 times daily 8/15 c. Use Flomax 0.4 daily d. if no improvement could consider CT abdomen and pelvisBut suspect there is a large component of her chronic low back pain playing a role her 6. Chronic low back pain with radiation possible a. Continue  current inpatient regimen as dictated above b. See discussion above 7. Developing some compensatory alkaolosis --was given lasix in admit a. Acidosis resolved b. AKI improved c. Watch mentation and consider Diamox if bicarb goes above 40 8. Iatrogenic fluid overload?  With  hypoxia a. Lasix was discontinued secondary to above b. -22 L since admission 9. Severe obesity BMI 56 OHSS a. As above orders 10. Hyperkalemia hypokalemia currently condary to AKI on admission a. Give K. Dur 40 twice daily b. A.m. labs with magnesium 11. Fibromyalgia pain/headaches a. Initially on fentanyl and OxyIR as well as Ativan for pain b. habituated to opiates for the past 20 years at least 12. IDDM + neuropathy + nephropathy-A1c 5.8 on 03/18/2020 a. Continue Lantus 10 units in addition to sliding scale b. Blood sugars well controlled 1 43-1 90 c. Home medications include 70 3012 units twice daily which we will be resuming in the outpatient setting d. Resumed on gabapentin 200 3 times daily 13. Mild thrombocytopenia a. Secondary to sepsis no further work-up 14. HTN a. ACE inhibitor from admission has been discontinued b. Blood pressure trends of slightly elevated 15. Likely dilutional anemia secondary to resuscitation attempts 16. Ongoing tobacco use 17. Hyperlipidemia 18. Reflux   DVT prophylaxis: Heparin Code Status: Full Family Communication: no family today  Disposition:   Status is: Inpatient  Remains inpatient appropriate because:Hemodynamically unstable, Persistent severe electrolyte disturbances and Unsafe d/c plan   Dispo: The patient is from: Home              Anticipated d/c is to: Unclear at this time              Anticipated d/c date is: 2 days              Patient currently is not medically stable to d/c.  Consultants:   Urologist  Procedures: Double-J stent placement  Antimicrobials: Ceftriaxone   Subjective: Very coherent Needed only 1+ assist to get to the bedside and is sitting in chair comfortably Complaining of nonspecific nausea but ate half of her grits this morning Points to her left lower quadrant  Overall has drastically improved over the last several days  Objective: Vitals:   03/25/20 0600 03/25/20 0749 03/25/20 0800  03/25/20 0842  BP: (!) 122/58   (!) 124/53  Pulse: 65   66  Resp: 14   18  Temp:   97.9 F (36.6 C)   TempSrc:   Oral   SpO2: 92% 94%  95%  Weight:      Height:        Intake/Output Summary (Last 24 hours) at 03/25/2020 0933 Last data filed at 03/25/2020 0725 Gross per 24 hour  Intake 253.48 ml  Output 500 ml  Net -246.52 ml   Filed Weights   03/20/20 1030 03/22/20 0500 03/23/20 0500  Weight: (!) 147.5 kg (!) 147.9 kg (!) 147.9 kg    Examination: Comfortable pleasant no distress EOMI NCAT no focal deficit EOMI NCAT neck soft supple Chest clear no added sound Abdomen slightly tender on the left side-large pannus-possible hernia central abdomen, deep palpation reveals tenderness on the left side Neurologically grossly intact much more coherent Psych euthymic  Data Reviewed: I have personally reviewed following labs and imaging studies  Potassium 3.2 Bicarb is 39 BUNs/creatinine 20/1.0  Radiology Studies:--> DG Chest Port 1 View  Result Date: 03/24/2020 CLINICAL DATA:  Shortness of breath EXAM: PORTABLE CHEST 1 VIEW COMPARISON:  03/22/2020 FINDINGS: Cardiac shadow remains enlarged. Lungs are  well aerated bilaterally. Mild central vascular congestion is noted. The perihilar density seen previously has resolved and was likely atelectatic in nature. No pneumothorax is seen. No bony abnormality is noted. IMPRESSION: Improved aeration as described. Electronically Signed   By: Inez Catalina M.D.   On: 03/24/2020 04:47     Scheduled Meds: . (feeding supplement) PROSource Plus  30 mL Oral TID BM  . ALPRAZolam  1 mg Oral BID  . budesonide (PULMICORT) nebulizer solution  0.5 mg Nebulization BID  . cefdinir  300 mg Oral Q12H  . Chlorhexidine Gluconate Cloth  6 each Topical Daily  . fluticasone  2 spray Each Nare Daily  . heparin  5,000 Units Subcutaneous Q8H  . ibuprofen  800 mg Oral TID  . insulin aspart  0-20 Units Subcutaneous TID WC  . insulin aspart  0-5 Units Subcutaneous  QHS  . insulin glargine  10 Units Subcutaneous Daily  . lactulose  20 g Oral BID  . mouth rinse  15 mL Mouth Rinse BID  . ondansetron  8 mg Oral Q12H  . opium-belladonna  1 suppository Rectal Q6H  . oxybutynin  10 mg Oral TID  . pantoprazole (PROTONIX) IV  40 mg Intravenous QHS  . potassium chloride  40 mEq Oral BID  . tamsulosin  0.4 mg Oral Daily   Continuous Infusions: . sodium chloride Stopped (03/22/20 1833)     LOS: 7 days    Time spent: 25  Nita Sells, MD [facility] To contact the attending provider between 7A-7P or the covering provider during after hours 7P-7A, please log into the web site [email or web address] and access using universal [place] password for that web site. If you do not have the password, please call the hospital operator.  03/25/2020, 9:33 AM

## 2020-03-25 NOTE — TOC Progression Note (Signed)
Transition of Care [facility]) - Progression Note    Patient Details  Name: Gina [name] MRN: [id number] Date of Birth: 04-07-1966  [facility] CM/SW Contact  Leeroy Cha, RN Phone Number: 03/25/2020, 10:32 AM  Clinical Narrative:    Mentation clearing, transitioned to room air this am from [place] 2l/min, po chronulac, transitioning to med surg floor today. Following for progression and toc needs.   Expected Discharge Plan: Home/Self Care Barriers to Discharge: Continued Medical Work up  Expected Discharge Plan and Services Expected Discharge Plan: Home/Self Care   Discharge Planning Services: CM Consult   Living arrangements for the past 2 months: Single Family Home                                       Social Determinants of Health (SDOH) Interventions    Readmission Risk Interventions No flowsheet data found.

## 2020-03-25 NOTE — Evaluation (Signed)
Physical Therapy Evaluation Patient Details Name: Gina [name] MRN: [id number] DOB: 09-28-1965 Today's Date: 03/25/2020   History of Present Illness  54 year old female who presented to the ED on 8/9 w/ c/o: sharp left flank pain. In ER was febrile, tachycardic, met SIRS/sepsis criteria. Initially was normotensive but became hypotensive in the ER. CT imaging showed left obstructing 38m stone w/ associated hydro. Urology was called. The patient went to the OR where she underwent cystoscopy and Left JJ stent and she remained somewhat encephalopathic despite adjustment of meds and opiates post surgery but delirium has now resolved. PHMx:  BMI 56, intermittent asthma, HTN, chronic pain syndrome?  IBS?  Fibromyalgia, IDDM, OSA, prior stage II-III left leg ulcer, prior exophytic right renal mass right kidney, multifactorial anemia  Clinical Impression  The patient ambulated x 180' without AD, HR 77 and SPO2 on RA 95%. Patient tolerated well. Pt admitted with above diagnosis.  Pt currently with functional limitations due to the deficits listed below (see PT Problem List). Pt will benefit from skilled PT to increase their independence and safety with mobility to allow discharge to the venue listed below.       Follow Up Recommendations No PT follow up    Equipment Recommendations  None recommended by PT    Recommendations for Other Services       Precautions / Restrictions Precautions Precautions: None Restrictions Weight Bearing Restrictions: No      Mobility  Bed Mobility               General bed mobility comments: Pt up in recliner upon arrival  Transfers Overall transfer level: Needs assistance Equipment used: None Transfers: Sit to/from Stand Sit to Stand: Supervision         General transfer comment: Ambulation S on unit without AD  Ambulation/Gait Ambulation/Gait assistance: Supervision Gait Distance (Feet): 180 Feet Assistive device: None Gait  Pattern/deviations: Step-through pattern   Gait velocity interpretation: <1.31 ft/sec, indicative of household ambulator General Gait Details: steady gait, SPO2 955, HR 77  Stairs            Wheelchair Mobility    Modified Rankin (Stroke Patients Only)       Balance Overall balance assessment: No apparent balance deficits (not formally assessed)                                           Pertinent Vitals/Pain Pain Assessment: No/denies pain Faces Pain Scale: Hurts little more Pain Location: "I can't breathe" - oxygen saturation is adequate, encouraged pt to inhalte through nose and exhale through her mouth Pain Intervention(s): Monitored during session    Home Living Family/patient expects to be discharged to:: Private residence Living Arrangements: Spouse/significant other;Children Available Help at Discharge: Family;Available 24 hours/day Type of Home: House Home Access: Stairs to enter Entrance Stairs-Rails: Right;Left;Can reach both Entrance Stairs-Number of Steps: 6 Home Layout: One level Home Equipment: Walker - 2 wheels;Shower seat      Prior Function Level of Independence: Independent               Hand Dominance   Dominant Hand: Right    Extremity/Trunk Assessment   Upper Extremity Assessment Upper Extremity Assessment: Defer to OT evaluation    Lower Extremity Assessment Lower Extremity Assessment: Overall WFL for tasks assessed       Communication   Communication: No difficulties  Cognition Arousal/Alertness: Awake/alert Behavior During Therapy: WFL for tasks assessed/performed Overall Cognitive Status: Within Functional Limits for tasks assessed                                        General Comments      Exercises     Assessment/Plan    PT Assessment Patient needs continued PT services  PT Problem List Decreased strength;Obesity       PT Treatment Interventions Gait training;Functional  mobility training;Patient/family education    PT Goals (Current goals can be found in the Care Plan section)  Acute Rehab PT Goals Patient Stated Goal: to go home soon PT Goal Formulation: With patient Time For Goal Achievement: 04/08/20 Potential to Achieve Goals: Good    Frequency Min 3X/week   Barriers to discharge        Co-evaluation   Reason for Co-Treatment: For patient/therapist safety PT goals addressed during session: Mobility/safety with mobility OT goals addressed during session: ADL's and self-care       AM-PAC PT "6 Clicks" Mobility  Outcome Measure Help needed turning from your back to your side while in a flat bed without using bedrails?: None Help needed moving from lying on your back to sitting on the side of a flat bed without using bedrails?: None Help needed moving to and from a bed to a chair (including a wheelchair)?: None Help needed standing up from a chair using your arms (e.g., wheelchair or bedside chair)?: None Help needed to walk in hospital room?: A Little Help needed climbing 3-5 steps with a railing? : A Little 6 Click Score: 22    End of Session   Activity Tolerance: Patient tolerated treatment well Patient left:  (with OT) Nurse Communication: Mobility status PT Visit Diagnosis: Unsteadiness on feet (R26.81);Difficulty in walking, not elsewhere classified (R26.2)    Time: 6244-6950 PT Time Calculation (min) (ACUTE ONLY): 10 min   Charges:   PT Evaluation $PT Eval Low Complexity: [place] [facility] Acute Rehabilitation Services Pager [phone] Office [phone]   Claretha Cooper 03/25/2020, 4:03 PM

## 2020-03-26 ENCOUNTER — Other Ambulatory Visit: Payer: Self-pay | Admitting: Urology

## 2020-03-26 LAB — CBC WITH DIFFERENTIAL/PLATELET
Abs Immature Granulocytes: 0.01 10*3/uL (ref 0.00–0.07)
Basophils Absolute: 0 10*3/uL (ref 0.0–0.1)
Basophils Relative: 0 %
Eosinophils Absolute: 0.4 10*3/uL (ref 0.0–0.5)
Eosinophils Relative: 10 %
HCT: 37.1 % (ref 36.0–46.0)
Hemoglobin: 11.3 g/dL — ABNORMAL LOW (ref 12.0–15.0)
Immature Granulocytes: 0 %
Lymphocytes Relative: 27 %
Lymphs Abs: 1.2 10*3/uL (ref 0.7–4.0)
MCH: 25.9 pg — ABNORMAL LOW (ref 26.0–34.0)
MCHC: 30.5 g/dL (ref 30.0–36.0)
MCV: 84.9 fL (ref 80.0–100.0)
Monocytes Absolute: 0.4 10*3/uL (ref 0.1–1.0)
Monocytes Relative: 8 %
Neutro Abs: 2.4 10*3/uL (ref 1.7–7.7)
Neutrophils Relative %: 55 %
Platelets: 172 10*3/uL (ref 150–400)
RBC: 4.37 MIL/uL (ref 3.87–5.11)
RDW: 18.3 % — ABNORMAL HIGH (ref 11.5–15.5)
WBC: 4.4 10*3/uL (ref 4.0–10.5)
nRBC: 0 % (ref 0.0–0.2)

## 2020-03-26 LAB — GLUCOSE, CAPILLARY: Glucose-Capillary: 135 mg/dL — ABNORMAL HIGH (ref 70–99)

## 2020-03-26 LAB — COMPREHENSIVE METABOLIC PANEL
ALT: 27 U/L (ref 0–44)
AST: 26 U/L (ref 15–41)
Albumin: 3.2 g/dL — ABNORMAL LOW (ref 3.5–5.0)
Alkaline Phosphatase: 73 U/L (ref 38–126)
Anion gap: 11 (ref 5–15)
BUN: 26 mg/dL — ABNORMAL HIGH (ref 6–20)
CO2: 33 mmol/L — ABNORMAL HIGH (ref 22–32)
Calcium: 8.4 mg/dL — ABNORMAL LOW (ref 8.9–10.3)
Chloride: 90 mmol/L — ABNORMAL LOW (ref 98–111)
Creatinine, Ser: 1.83 mg/dL — ABNORMAL HIGH (ref 0.44–1.00)
GFR calc Af Amer: 35 mL/min — ABNORMAL LOW (ref >60)
GFR calc non Af Amer: 31 mL/min — ABNORMAL LOW (ref >60)
Glucose, Bld: 134 mg/dL — ABNORMAL HIGH (ref 70–99)
Potassium: 3.4 mmol/L — ABNORMAL LOW (ref 3.5–5.1)
Sodium: 134 mmol/L — ABNORMAL LOW (ref 135–145)
Total Bilirubin: 0.7 mg/dL (ref 0.3–1.2)
Total Protein: 6.2 g/dL — ABNORMAL LOW (ref 6.5–8.1)

## 2020-03-26 LAB — MAGNESIUM: Magnesium: 1.7 mg/dL (ref 1.7–2.4)

## 2020-03-26 MED ORDER — LACTULOSE 10 GM/15ML PO SOLN: 20.0000 g | Freq: Two times a day (BID) | ORAL | 0 refills | Status: DC

## 2020-03-26 MED ORDER — OXYCODONE HCL 5 MG PO TABS: 5.0000 mg | ORAL_TABLET | Freq: Three times a day (TID) | ORAL | 0 refills | Status: DC | PRN

## 2020-03-26 MED ORDER — INSULIN ASPART 100 UNIT/ML ~~LOC~~ SOLN: 0.0000 [IU] | Freq: Three times a day (TID) | SUBCUTANEOUS | 11 refills | Status: DC

## 2020-03-26 MED ORDER — CEFDINIR 300 MG PO CAPS: 300.0000 mg | ORAL_CAPSULE | Freq: Two times a day (BID) | ORAL | 0 refills | Status: DC

## 2020-03-26 MED ORDER — PANTOPRAZOLE SODIUM 40 MG PO TBEC: 40.0000 mg | DELAYED_RELEASE_TABLET | Freq: Every day | ORAL | Status: DC

## 2020-03-26 MED ORDER — POTASSIUM CHLORIDE CRYS ER 20 MEQ PO TBCR: 40.0000 meq | EXTENDED_RELEASE_TABLET | Freq: Two times a day (BID) | ORAL | 0 refills | Status: DC

## 2020-03-26 MED ORDER — TAMSULOSIN HCL 0.4 MG PO CAPS: 0.4000 mg | ORAL_CAPSULE | Freq: Every day | ORAL | 0 refills | Status: DC

## 2020-03-26 MED ORDER — INSULIN ASPART 100 UNIT/ML ~~LOC~~ SOLN: 0.0000 [IU] | Freq: Every day | SUBCUTANEOUS | 11 refills | Status: DC

## 2020-03-26 MED ORDER — IBUPROFEN 800 MG PO TABS: 800.0000 mg | ORAL_TABLET | Freq: Three times a day (TID) | ORAL | 0 refills | Status: DC

## 2020-03-26 MED ORDER — BELLADONNA ALKALOIDS-OPIUM 16.2-60 MG RE SUPP: 1.0000 | Freq: Four times a day (QID) | RECTAL | 0 refills | Status: DC

## 2020-03-26 MED ORDER — OXYBUTYNIN CHLORIDE 5 MG PO TABS: 10.0000 mg | ORAL_TABLET | Freq: Three times a day (TID) | ORAL | 0 refills | Status: DC

## 2020-03-26 NOTE — Discharge Summary (Signed)
Physician Discharge Summary  RENESSA WELLNITZ [name]:[id number] DOB: August 29, 1965 DOA: 03/18/2020  PCP: Elenore Paddy, NP  Admit date: 03/18/2020 Discharge date: 03/26/2020  Time spent: 40 minutes  Recommendations for Outpatient Follow-up:  1. Patient to be seen by urology in the outpatient setting very soon I will CC Dr. Junious Silk on this discharge summary 2. Needs Chem-12, CBC in about 1 week 3. Will need follow-up with her pain physician for chronic low back pain I have given her very limited prescription of oxycodone on discharge and reviewed the PDMP with regards to the same--- she has predominant urinary colic and should use the BNO suppositories for this 4. We will need also discussion with her outpatient physicians with regards to weight loss as well as CPAP initiation if she is able to tolerate the same 5. Patient will need complete cefdinir on 8/18 and was given a prescription for the same for aspiration 6. May need initiation in the outpatient setting with Lasix as she had some volume overload this admission 7. We will resume oral medications for diabetes and her other medications-she appears to be on Ozempic and may need titration in the outpatient setting 8. Please see MAR for further changes to medications  Discharge Diagnoses:  Principal Problem:   Severe sepsis with septic shock ([place]) Active Problems:   Hyperlipidemia   Type 2 diabetes mellitus with diabetic neuropathy, unspecified ([place])   Hypertension   Chronic back pain   GERD (gastroesophageal reflux disease)   Morbid obesity with BMI of 50.0-59.9, adult (HCC)   Chronic pain disorder   Peripheral neuropathy   Iron deficiency anemia   Sepsis with acute renal failure (HCC)   Ureteral stone with hydronephrosis   Hyperkalemia   Acute pyelonephritis   Tobacco abuse   Wheezing   Aspiration into airway   Acute respiratory failure with hypoxia and hypercarbia ([place])  Discharge Condition: Improved but overall is  guarded  Diet recommendation: Heart healthy diabetic  Filed Weights   03/22/20 0500 03/23/20 0500 03/26/20 0500  Weight: (!) 147.9 kg (!) 147.9 kg 132.3 kg    History of present illness:  54 white female BMI 56, intermittent asthma, HTN, chronic pain syndrome?  IBS?  Fibromyalgia, IDDM, OSA, prior stage II-III left leg ulcer Prior exophytic right renal mass right kidney found coincidentally last admission 02/15/2017 Multifactorial anemia Prior Streptococcus galactosemia bacteremia 2018 Prior MRSA soft tissue infection of the chin 2006 Prior lysis of adhesions was BAL TSO 2006 Dr. Glennon Mac More  Admit 03/18/2020 septic shock AKI obstructive uropathy with left renal calculi status post left double-J stent-found to have obstructing 5 mm UVJ calculus plus hydronephrosis-initially required pressors and was admitted by critical care medicine was transferred to the hospitalist service on 8/10 Patient came encephalopathic 8/10 likely a combination of hypercarbia polypharmacy in the setting of hypercarbia -critical care revisited her and her delirium resolved as of 8/14 8/15 with reinitiation of some of her anxiolytics and other meds She was transferred to floor on 8/16 She is nearing d/c  Hospital Course:  1. Multifactorial delirium 2/2 hypercarbia, polypharmacy a. ABG 8/12 PCO2 of 55 which is not surprisingRefusing BiPAP and may need outpatient titration b. Stabilized on floor i. Patient was resumed on Xanax 1 mg as needed anxiety with good effect on 8/14-she uses this at home and last was filled in July c.  Meds adjusted and tailored with significant improvement of affect and delirium therefore we will try to i. Taper and discontinue fentanyl by 8/16 for  severe pain ii. Give as first choice for pain ibuprofen 800 3 times daily iii. Second choice for pain oxycodone from 5 every 8 as needed iv. Reviewed the PDMP and have given her limited prescription of oxycodone she will need to follow-up with  her pain physician for low back pain office [place] for refills 2. Resolved septic shock secondary to obstructing 5 mm left ureteric calculus with E. coli pansen a. IV antibiotics for prolonged period transitioned eventually to orals b. Definitive management as per urology--supposed to follow-up with Dr. Junious Silk 2 weeks from placement of the stents and will need to follow-up with him in the outpatient setting I will send him a message stating that she is discharged coordinate the same 3. Aspiration pneumonia diagnosed 8/13 a.m. a. Coughing and vomiting overnight 8/12-seems to have aspirated confirmed on CXR = aspiration  b. Was escalated to meropenem "->currently to cephalosporin 18 complete on 8/18 and prescription was given for this c. X-ray from 8/15 shows improving aeration per my overview 4. AKI with lactic acidosis likely secondary to resolving oliguria  5. Abdominal pain NOS a. Likely stent colic-needs to continue BNO suppositories b. Increasing oxybutynin to 10 mg 3 times daily 8/15 c. Use Flomax 0.4 daily d. CT scan was considered but felt that this was mainly her back pain  6. Chronic low back pain with radiation possible a. Continue current inpatient regimen as dictated above b. See discussion above 7. Developing some compensatory alkaolosis --was given lasix in admit a. Acidosis resolved b. AKI improved c. Alkalosis resolved and she did not require any Diamox  8. Iatrogenic fluid overload?  With hypoxia a. Lasix was discontinued secondary to above b. -22 L since admission 9. Severe obesity BMI 56 OHSS a. As above orders 10. Hyperkalemia hypokalemia currently condary to AKI on admission a. Give K. Dur 40 twice daily and will discharge with the same  magnesium acceptable at 1.7 m 11. Fibromyalgia pain/headaches a. Initially on fentanyl and OxyIR as well as Ativan for pain b. habituated to opiates for the past 20 years at least 12. IDDM + neuropathy + nephropathy-A1c  5.8 on 03/18/2020 a. Continue Lantus 10 units in addition to sliding scale b. Blood sugars well controlled 1 43-1 90 c. Resuming home meds d. Resumed on gabapentin 200 3 times daily 13. Mild thrombocytopenia a. Secondary to sepsis no further work-up 14. HTN a. ACE inhibitor from admission has been discontinued b. Blood pressure trends of slightly elevated 15. Likely dilutional anemia secondary to resuscitation attempts 16. Ongoing tobacco use 17. Hyperlipidemia 18. Reflux  Procedures:  Multiple (i.e. Studies not automatically included, echos, thoracentesis, etc; not x-rays)  Consultations:  Urology  CCM  Discharge Exam: Vitals:   03/26/20 0644 03/26/20 0851  BP: (!) 123/54   Pulse: 73   Resp: 20   Temp: 97.9 F (36.6 C)   SpO2: 92% 93%    General: Awake coherent no distress EOMI NCAT no focal deficit Cardiovascular: S1-S2 no murmur Respiratory: Clinically clear no added sound Abdomen soft no rebound no guarding No neurological deficit  Discharge Instructions   Discharge Instructions    Diet - low sodium heart healthy   Complete by: As directed    Discharge instructions   Complete by: As directed    You are a severe infection and confusion probably because of multiple reasons but these are resolved You will need labs in the outpatient setting with your primary physician I have prescribed for you very limited amount of  pain meds and other anxiolytics but you should follow-up with your primary physician in addition to your urologist I will [place] your urologist to make sure he is aware that you will need an he will try to contact you   Increase activity slowly   Complete by: As directed    No wound care   Complete by: As directed      Allergies as of 03/26/2020      Reactions   Darvocet [propoxyphene N-acetaminophen] Nausea And Vomiting   Morphine And Related Itching   Penicillins Hives, Swelling   Tolerates Rocephin Swelling all over body  Has patient had a  PCN reaction causing immediate rash, facial/tongue/throat swelling, SOB or lightheadedness with hypotension: Yes Has patient had a PCN reaction causing severe rash involving mucus membranes or skin necrosis: Yes Has patient had a PCN reaction that required hospitalization Unknown Has patient had a PCN reaction occurring within the last 10 years: No  If all of the above answers are "NO", then may proceed with Cephalosporin use.   Tape Hives   Adhesive tape   Other    Bee venom-anaphylaxis       Medication List    STOP taking these medications   acetaminophen 325 MG tablet Commonly known as: TYLENOL   BC HEADACHE POWDER PO   NovoLOG Mix 70/30 FlexPen (70-30) 100 UNIT/ML FlexPen Generic drug: insulin aspart protamine - aspart     TAKE these medications   ALPRAZolam 1 MG tablet Commonly known as: XANAX Take 1 mg by mouth 2 (two) times daily as needed for anxiety.   atorvastatin 20 MG tablet Commonly known as: LIPITOR TAKE 1 TABLET (20 MG TOTAL) DAILY BY MOUTH.   atorvastatin 40 MG tablet Commonly known as: LIPITOR Take 40 mg by mouth daily.   cefdinir 300 MG capsule Commonly known as: OMNICEF Take 1 capsule (300 mg total) by mouth every 12 (twelve) hours.   fluticasone 50 MCG/ACT nasal spray Commonly known as: FLONASE Place 1 spray into both nostrils daily as needed for allergies.   Fluticasone-Salmeterol 250-50 MCG/DOSE Aepb Commonly known as: [place]ela Inhub Inhale 1 puff into the lungs 2 (two) times daily.   glucose blood test strip Commonly known as: ONE TOUCH ULTRA TEST USE TO TEST BLOOD SUGAR 3 TIMES DAILY   ibuprofen 800 MG tablet Commonly known as: ADVIL Take 1 tablet (800 mg total) by mouth 3 (three) times daily. What changed:   medication strength  how much to take  when to take this  reasons to take this   insulin aspart 100 UNIT/ML injection Commonly known as: novoLOG Inject 0-20 Units into the skin 3 (three) times daily with meals.   insulin  aspart 100 UNIT/ML injection Commonly known as: novoLOG Inject 0-5 Units into the skin at bedtime.   lactulose 10 GM/15ML solution Commonly known as: CHRONULAC Take 30 mLs (20 g total) by mouth 2 (two) times daily.   lisinopril 10 MG tablet Commonly known as: ZESTRIL Take 10 mg by mouth daily.   metFORMIN 850 MG tablet Commonly known as: GLUCOPHAGE TAKE 1 TABLET (850 MG TOTAL) BY MOUTH 2 (TWO) TIMES DAILY WITH A MEAL. What changed:   how much to take  when to take this  additional instructions   nystatin powder Commonly known as: nystatin Apply topically 3 (three) times daily as needed. What changed:   how much to take  reasons to take this   omeprazole 20 MG capsule Commonly known as: PRILOSEC TAKE 1 CAPSULE (  20 MG TOTAL) BY MOUTH DAILY BEFORE BREAKFAST.   opium-belladonna 16.2-60 MG suppository Commonly known as: B&O SUPPRETTES Place 1 suppository rectally every 6 (six) hours.   oxybutynin 5 MG tablet Commonly known as: DITROPAN Take 2 tablets (10 mg total) by mouth 3 (three) times daily.   oxyCODONE 5 MG immediate release tablet Commonly known as: Oxy IR/ROXICODONE Take 1 tablet (5 mg total) by mouth every 8 (eight) hours as needed for severe pain.   Ozempic (0.25 or 0.5 MG/DOSE) 2 MG/1.5ML Sopn Generic drug: Semaglutide(0.25 or 0.5MG /DOS) Inject 2 mg into the skin once a week.   polyvinyl alcohol 1.4 % ophthalmic solution Commonly known as: LIQUIFILM TEARS Place 1 drop into both eyes as needed for dry eyes.   potassium chloride SA 20 MEQ tablet Commonly known as: KLOR-CON Take 2 tablets (40 mEq total) by mouth 2 (two) times daily.   pregabalin 200 MG capsule Commonly known as: Lyrica TAKE 1 CAPSULE BY MOUTH THREE TIMES A DAY   tamsulosin 0.4 MG Caps capsule Commonly known as: FLOMAX Take 1 capsule (0.4 mg total) by mouth daily. Start taking on: March 27, 2020   Ventolin HFA 108 (90 Base) MCG/ACT inhaler Generic drug: albuterol INHALE 2 PUFFS  EVERY 6 (SIX) HOURS AS NEEDED INTO THE LUNGS FOR WHEEZING OR SHORTNESS OF BREATH. What changed: See the new instructions.      Allergies  Allergen Reactions  . Darvocet [Propoxyphene N-Acetaminophen] Nausea And Vomiting  . Morphine And Related Itching  . Penicillins Hives and Swelling    Tolerates Rocephin  Swelling all over body  Has patient had a PCN reaction causing immediate rash, facial/tongue/throat swelling, SOB or lightheadedness with hypotension: Yes Has patient had a PCN reaction causing severe rash involving mucus membranes or skin necrosis: Yes Has patient had a PCN reaction that required hospitalization Unknown Has patient had a PCN reaction occurring within the last 10 years: No  If all of the above answers are "NO", then may proceed with Cephalosporin use.   . Tape Hives    Adhesive tape  . Other     Bee venom-anaphylaxis     Follow-up Information    Festus Aloe, MD. Call.   Specialty: Urology Contact information: [place] [address] [phone]                The results of significant diagnostics from this hospitalization (including imaging, microbiology, ancillary and laboratory) are listed below for reference.    Significant Diagnostic Studies: DG Chest 2 View  Result Date: 03/18/2020 CLINICAL DATA:  Left-sided pain, initial encounter EXAM: CHEST - 2 VIEW COMPARISON:  10/20/2017 FINDINGS: Cardiac shadow is enlarged but accentuated by the frontal technique. The lungs are well aerated bilaterally. Mild central vascular congestion is seen. No focal infiltrate or effusion is noted. Degenerative changes of the thoracic spine are seen. IMPRESSION: Central vascular congestion without other acute abnormality. Electronically Signed   By: Inez Catalina M.D.   On: 03/18/2020 09:01   DG Chest Port 1 View  Result Date: 03/24/2020 CLINICAL DATA:  Shortness of breath EXAM: PORTABLE CHEST 1 VIEW COMPARISON:  03/22/2020 FINDINGS: Cardiac shadow  remains enlarged. Lungs are well aerated bilaterally. Mild central vascular congestion is noted. The perihilar density seen previously has resolved and was likely atelectatic in nature. No pneumothorax is seen. No bony abnormality is noted. IMPRESSION: Improved aeration as described. Electronically Signed   By: Inez Catalina M.D.   On: 03/24/2020 04:47   DG CHEST  PORT 1 VIEW  Result Date: 03/22/2020 CLINICAL DATA:  54 year old female possible aspiration. EXAM: PORTABLE CHEST 1 VIEW COMPARISON:  Portable chest 03/19/2020 and earlier. FINDINGS: Portable AP semi upright view at 0218 hours. Increased kyphotic positioning. Mediastinal contours appear stable. The patient's head obscures some of the lung apices. New Patchy and confluent right perihilar and lower lung opacity. Questionable mild increased left perihilar opacity. No pneumothorax. No pleural effusion is evident. Stable visualized osseous structures. IMPRESSION: New patchy and confluent right perihilar and lower lung opacity, compatible with aspiration in this setting. Electronically Signed   By: Genevie Ann M.D.   On: 03/22/2020 02:34   DG CHEST PORT 1 VIEW  Result Date: 03/19/2020 CLINICAL DATA:  Shortness of breath EXAM: PORTABLE CHEST 1 VIEW COMPARISON:  03/18/2020 FINDINGS: There is cardiomegaly with vascular congestion. There is no focal infiltrate. There is no pneumothorax. No large pleural effusion. No acute osseous abnormality. IMPRESSION: Cardiomegaly with vascular congestion. Electronically Signed   By: Constance Holster M.D.   On: 03/19/2020 22:45   DG Abd 2 Views  Result Date: 03/22/2020 CLINICAL DATA:  54 year old female with concern for small bowel obstruction EXAM: ABDOMEN - 2 VIEW COMPARISON:  Abdominal radiograph dated 11/09/2019 FINDINGS: There is no bowel dilatation or evidence of obstruction. No free air. Several stones noted over the inferior left renal silhouette. A pigtail left ureteral stent is in place. Cholecystectomy clips.  The osseous structures and soft tissues are unremarkable. IMPRESSION: 1. No evidence of obstruction. 2. Left renal calculi and left ureteral stent. Electronically Signed   By: Anner Crete M.D.   On: 03/22/2020 17:53   DG C-Arm 1-60 Min-No Report  Result Date: 03/18/2020 Fluoroscopy was utilized by the requesting physician.  No radiographic interpretation.   CT Renal Stone Study  Result Date: 03/18/2020 CLINICAL DATA:  Left lower abdominal pain that radiates into the left flank. Fever. EXAM: CT ABDOMEN AND PELVIS WITHOUT CONTRAST TECHNIQUE: Multidetector CT imaging of the abdomen and pelvis was performed following the standard protocol without IV contrast. COMPARISON:  01/26/2020 FINDINGS: Lower chest: Unremarkable. Hepatobiliary: The liver shows diffusely decreased attenuation suggesting fat deposition. 25.8 cm craniocaudal length of the liver is compatible with hepatomegaly. No focal abnormality in the liver on this study without intravenous contrast. Gallbladder is surgically absent. No intrahepatic or extrahepatic biliary dilation. Pancreas: No focal mass lesion. No dilatation of the main duct. No intraparenchymal cyst. No peripancreatic edema. Spleen: No splenomegaly. No focal mass lesion. Adrenals/Urinary Tract: No adrenal nodule or mass. Cortical scarring again noted upper pole right kidney with stable exophytic 1.6 x 1.2 cm lesion stable since prior study and also when comparing back to an exam from 11/20/2010. Cluster of stones and/or staghorn calculus in the lower pole left kidney is similar. There is mild left hydroureteronephrosis on today's exam secondary to a 3 x 3 x 5 mm stone in the distal left ureter immediately proximal to the left UVJ (axial 76/2). Bladder is decompressed. Stomach/Bowel: Stomach is unremarkable. No gastric wall thickening. No evidence of outlet obstruction. Duodenum is normally positioned as is the ligament of Treitz. No small bowel wall thickening. No small bowel  dilatation. The terminal ileum is normal. The appendix is normal. No gross colonic mass. No colonic wall thickening. Vascular/Lymphatic: There is abdominal aortic atherosclerosis without aneurysm. There is no gastrohepatic or hepatoduodenal ligament lymphadenopathy. No retroperitoneal or mesenteric lymphadenopathy. No pelvic sidewall lymphadenopathy. Reproductive: The uterus is surgically absent. There is no adnexal mass. Other: No intraperitoneal free fluid. Musculoskeletal:  Marked laxity of the ventral fascia noted, with prominent pannus. Similar appearance of umbilical hernia containing only fat. No worrisome lytic or sclerotic osseous abnormality. IMPRESSION: 1. 3 x 3 x 5 mm distal left ureteral stone with mild left hydroureteronephrosis. 2. Cluster of stones and/or staghorn calculus in the lower pole left kidney, similar to prior. 3. Hepatomegaly with hepatic steatosis. 4. Stable exophytic lesion upper pole right kidney since 2012, likely a cyst complicated by proteinaceous debris or hemorrhage. 5. Aortic Atherosclerosis (ICD10-I70.0). Electronically Signed   By: Misty Stanley M.D.   On: 03/18/2020 10:29    Microbiology: Recent Results (from the past 240 hour(s))  SARS Coronavirus 2 by RT PCR (hospital order, performed in [facility] hospital lab) Nasopharyngeal Nasopharyngeal Swab     Status: None   Collection Time: 03/18/20  9:02 AM   Specimen: Nasopharyngeal Swab  Result Value Ref Range Status   SARS Coronavirus 2 NEGATIVE NEGATIVE Final    Comment: (NOTE) SARS-CoV-2 target nucleic acids are NOT DETECTED.  The SARS-CoV-2 RNA is generally detectable in upper and lower respiratory specimens during the acute phase of infection. The lowest concentration of SARS-CoV-2 viral copies this assay can detect is 250 copies / mL. A negative result does not preclude SARS-CoV-2 infection and should not be used as the sole basis for treatment or other patient management decisions.  A negative result may  occur with improper specimen collection / handling, submission of specimen other than nasopharyngeal swab, presence of viral mutation(s) within the areas targeted by this assay, and inadequate number of viral copies (<250 copies / mL). A negative result must be combined with clinical observations, patient history, and epidemiological information.  Fact Sheet for Patients:   StrictlyIdeas.no  Fact Sheet for Healthcare Providers: BankingDealers.co.za  This test is not yet approved or  cleared by the [place] FDA and has been authorized for detection and/or diagnosis of SARS-CoV-2 by FDA under an Emergency Use Authorization (EUA).  This EUA will remain in effect (meaning this test can be used) for the duration of the COVID-19 declaration under Section 564(b)(1) of the Act, 21 U.S.C. section 360bbb-3(b)(1), unless the authorization is terminated or revoked sooner.  Performed at [facility], [place] [address] [place], [place] [address]   Blood Culture (routine x 2)     Status: None   Collection Time: 03/18/20  9:10 AM   Specimen: BLOOD  Result Value Ref Range Status   Specimen Description   Final    BLOOD LEFT ANTECUBITAL Performed at [facility] [place] [address] [place], [place] [address]    Special Requests   Final    BLOOD Blood Culture adequate volume Performed at [facility] [address] [place], [place] [address]    Culture   Final    NO GROWTH 5 DAYS Performed at [facility], [place] [address] [place], [place] [address]    Report Status 03/23/2020 FINAL  Final  Urine culture     Status: Abnormal   Collection Time: 03/18/20  9:57 AM   Specimen: In/Out Cath Urine  Result Value Ref Range Status   Specimen Description   Final    IN/OUT CATH URINE Performed at [facility] [address], [place], [place] [address]    Special Requests    Final    NONE Performed at [facility], [place] [address], [place], [place] [address]    Culture >=100,000 COLONIES/mL ESCHERICHIA COLI (A)  Final   Report Status 03/20/2020 FINAL  Final   Organism ID, Bacteria ESCHERICHIA COLI (A)  Final      Susceptibility   Escherichia coli - MIC*    AMPICILLIN >=32 RESISTANT Resistant     CEFAZOLIN >=64 RESISTANT Resistant     CEFTRIAXONE <=0.25 SENSITIVE Sensitive     CIPROFLOXACIN 0.5 SENSITIVE Sensitive     GENTAMICIN <=1 SENSITIVE Sensitive     IMIPENEM <=0.25 SENSITIVE Sensitive     NITROFURANTOIN <=16 SENSITIVE Sensitive     TRIMETH/SULFA >=320 RESISTANT Resistant     AMPICILLIN/SULBACTAM >=32 RESISTANT Resistant     PIP/TAZO >=128 RESISTANT Resistant     * >=100,000 COLONIES/mL ESCHERICHIA COLI  MRSA PCR Screening     Status: None   Collection Time: 03/18/20  4:56 PM   Specimen: Nasal Mucosa; Nasopharyngeal  Result Value Ref Range Status   MRSA by PCR NEGATIVE NEGATIVE Final    Comment:        The GeneXpert MRSA Assay (FDA approved for NASAL specimens only), is one component of a comprehensive MRSA colonization surveillance program. It is not intended to diagnose MRSA infection nor to guide or monitor treatment for MRSA infections. Performed at [facility], [place] [address] [place], [place] [address]      Labs: Basic Metabolic Panel: Recent Labs  Lab 03/20/20 0138 03/21/20 0174 03/22/20 0217 03/23/20 0903 03/24/20 0254 03/25/20 0306 03/26/20 0538  NA 136   < > 142 145 143 140 134*  K 6.1*   < > 4.4 3.1* 3.5 3.2* 3.4*  CL 109   < > 104 94* 92* 90* 90*  CO2 20*   < > 27 37* 37* 39* 33*  GLUCOSE 407*   < > 164* 174* 138* 140* 134*  BUN 33*   < > 30* 18 16 20  26*  CREATININE 1.52*   < > 1.00 0.89 0.87 1.04* 1.83*  CALCIUM 7.8*   < > 9.1 9.2 8.7* 8.6* 8.4*  MG  --   --   --  1.1* 1.8  --  1.7  PHOS 3.2  --   --   --   --   --   --    < > = values in this interval not displayed.    Liver Function Tests: Recent Labs  Lab 03/20/20 0138 03/22/20 0217 03/23/20 0903 03/26/20 0538  AST  --  36  36 23 26  ALT  --  53*  53* 35 27  ALKPHOS  --  113  115 90 73  BILITOT  --  0.7  0.6 1.0 0.7  PROT  --  7.7  7.8 6.8 6.2*  ALBUMIN 3.1* 3.7  3.9 3.7 3.2*   No results for input(s): LIPASE, AMYLASE in the last 168 hours. Recent Labs  Lab 03/22/20 0217  AMMONIA 44*   CBC: Recent Labs  Lab 03/22/20 0217 03/23/20 0903 03/24/20 0254 03/25/20 0306 03/26/20 0538  WBC 5.0 6.5 6.1 4.4 4.4  NEUTROABS 3.9 5.2 4.3 2.7 2.4  HGB 11.5* 11.4* 11.5* 12.0 11.3*  HCT 40.5 36.6 38.6 40.1 37.1  MCV 88.6 83.6 84.3 84.6 84.9  PLT 142* 174 159 167 172   Cardiac Enzymes: No results for input(s): CKTOTAL, CKMB, CKMBINDEX, TROPONINI in the last 168 hours. BNP: BNP (last 3 results) Recent Labs    03/24/20 0254  BNP 72.3    ProBNP (last 3 results) No results for input(s): PROBNP in the last 8760 hours.  CBG: Recent Labs  Lab 03/25/20 0809 03/25/20 1154 03/25/20 1746 03/25/20 2142 03/26/20 0737  GLUCAP 143* 150* 135* 132* 135*       Signed:  Nita Sells MD   [facility] 03/26/2020, 10:03 AM

## 2020-03-26 NOTE — TOC Transition Note (Signed)
Transition of Care [facility]) - CM/SW Discharge Note   Patient Details  Name: MARILYNN EKSTEIN MRN: [id number] Date of Birth: 1966-07-18  Transition of Care [facility] CM/SW Contact:  Dessa Phi, RN Phone Number: 03/26/2020, 11:31 AM   Clinical Narrative:  [facility] for HHRN-meds, disease mgmnt. Patient in agreement to no HHPT/OT-they recc no f/u. Patient has a rw,has support spouse,dtr.Has own transportation. No further CM needs.     Final next level of care: [place] Barriers to Discharge: No Barriers Identified   Patient Goals and CMS Choice Patient states their goals for this hospitalization and ongoing recovery are:: to go home once I am better CMS Medicare.gov Compare Post Acute Care list provided to:: Patient Choice offered to / list presented to : Patient  Discharge Placement                       Discharge Plan and Services   Discharge Planning Services: CM Consult Post Acute Care Choice: Home Health                    HH Arranged: RN [place]: [place] ([place]) Date [place]: 03/26/20 Time [place]: 1130 Representative spoke with at [place]: [place] ([place]) Interventions     Readmission Risk Interventions No flowsheet data found.

## 2020-03-26 NOTE — Plan of Care (Signed)

## 2020-03-26 NOTE — Care Management Important Message (Signed)
Important Message  Patient Details IM Letter given to the Patient Name: Gina [name] MRN: [id number] Date of Birth: June 08, 1966   Medicare Important Message Given:  Yes     Kerin Salen 03/26/2020, 10:33 AM

## 2020-03-26 NOTE — Discharge Instructions (Signed)
Acute Kidney Injury, Adult  Acute kidney injury is a sudden worsening of kidney function. The kidneys are organs that have several jobs. They filter the blood to remove waste products and extra fluid. They also maintain a healthy balance of minerals and hormones in the body, which helps control blood pressure and keep bones strong. With this condition, your kidneys do not do their jobs as well as they should. This condition ranges from mild to severe. Over time it may develop into long-lasting (chronic) kidney disease. Early detection and treatment may prevent acute kidney injury from developing into a chronic condition. What are the causes? Common causes of this condition include:  A problem with blood flow to the kidneys. This may be caused by: ? Low blood pressure (hypotension) or shock. ? Blood loss. ? Heart and blood vessel (cardiovascular) disease. ? Severe burns. ? Liver disease.  Direct damage to the kidneys. This may be caused by: ? Certain medicines. ? A kidney infection. ? Poisoning. ? Being around or in contact with toxic substances. ? A surgical wound. ? A hard, direct hit to the kidney area.  A sudden blockage of urine flow. This may be caused by: ? Cancer. ? Kidney stones. ? An enlarged prostate in males. What are the signs or symptoms? Symptoms of this condition may not be obvious until the condition becomes severe. Symptoms of this condition can include:  Tiredness (lethargy), or difficulty staying awake.  Nausea or vomiting.  Swelling (edema) of the face, legs, ankles, or feet.  Problems with urination, such as: ? Abdominal pain, or pain along the side of your stomach (flank). ? Decreased urine production. ? Decrease in the force of urine flow.  Muscle twitches and cramps, especially in the legs.  Confusion or trouble concentrating.  Loss of appetite.  Fever. How is this diagnosed? This condition may be diagnosed with tests, including:  Blood  tests.  Urine tests.  Imaging tests.  A test in which a sample of tissue is removed from the kidneys to be examined under a microscope (kidney biopsy). How is this treated? Treatment for this condition depends on the cause and how severe the condition is. In mild cases, treatment may not be needed. The kidneys may heal on their own. In more severe cases, treatment will involve:  Treating the cause of the kidney injury. This may involve changing any medicines you are taking or adjusting your dosage.  Fluids. You may need specialized IV fluids to balance your body's needs.  Having a catheter placed to drain urine and prevent blockages.  Preventing problems from occurring. This may mean avoiding certain medicines or procedures that can cause further injury to the kidneys. In some cases treatment may also require:  A procedure to remove toxic wastes from the body (dialysis or continuous renal replacement therapy - CRRT).  Surgery. This may be done to repair a torn kidney, or to remove the blockage from the urinary system. Follow these instructions at home: Medicines  Take over-the-counter and prescription medicines only as told by your health care provider.  Do not take any new medicines without your health care provider's approval. Many medicines can worsen your kidney damage.  Do not take any vitamin and mineral supplements without your health care provider's approval. Many nutritional supplements can worsen your kidney damage. Lifestyle  If your health care provider prescribed changes to your diet, follow them. You may need to decrease the amount of protein you eat.  Achieve and maintain a  healthy weight. If you need help with this, ask your health care provider.  Start or continue an exercise plan. Try to exercise at least 30 minutes a day, 5 days a week.  Do not use any tobacco products, such as cigarettes, chewing tobacco, and e-cigarettes. If you need help quitting, ask your  health care provider. General instructions  Keep track of your blood pressure. Report changes in your blood pressure as told by your health care provider.  Stay up to date with immunizations. Ask your health care provider which immunizations you need.  Keep all follow-up visits as told by your health care provider. This is important. Where to find more information  American Association of Kidney Patients: BombTimer.gl  National Kidney Foundation: www.kidney.[place]: https://mathis.com/  Life Options Rehabilitation Program: ? www.lifeoptions.org ? www.kidneyschool.org Contact a health care provider if:  Your symptoms get worse.  You develop new symptoms. Get help right away if:  You develop symptoms of worsening kidney disease, which include: ? Headaches. ? Abnormally dark or light skin. ? Easy bruising. ? Frequent hiccups. ? Chest pain. ? Shortness of breath. ? End of menstruation in women. ? Seizures. ? Confusion or altered mental status. ? Abdominal or back pain. ? Itchiness.  You have a fever.  Your body is producing less urine.  You have pain or bleeding when you urinate. Summary  Acute kidney injury is a sudden worsening of kidney function.  Acute kidney injury can be caused by problems with blood flow to the kidneys, direct damage to the kidneys, and sudden blockage of urine flow.  Symptoms of this condition may not be obvious until it becomes severe. Symptoms may include edema, lethargy, confusion, nausea or vomiting, and problems passing urine.  This condition can usually be diagnosed with blood tests, urine tests, and imaging tests. Sometimes a kidney biopsy is done to diagnose this condition.  Treatment for this condition often involves treating the underlying cause. It is treated with fluids, medicines, dialysis, diet changes, or surgery. This information is not intended to replace advice given to you by your health care provider. Make  sure you discuss any questions you have with your health care provider. Document Revised: 07/09/2017 Document Reviewed: 07/17/2016 Elsevier Patient Education  [place]. Ureteral Stent Implantation, Care After This sheet gives you information about how to care for yourself after your procedure. Your health care provider may also give you more specific instructions. If you have problems or questions, contact your health care provider.  Be sure to follow-up with Dr. Junious Silk to plan stent/stone removal   What can I expect after the procedure? After the procedure, it is common to have:  Nausea.  Mild pain when you urinate. You may feel this pain in your lower back or lower abdomen. The pain should stop within a few minutes after you urinate. This may last for up to 1 week.  A small amount of blood in your urine for several days. Follow these instructions at home: Medicines  Take over-the-counter and prescription medicines only as told by your health care provider.  If you were prescribed an antibiotic medicine, take it as told by your health care provider. Do not stop taking the antibiotic even if you start to feel better.  Do not drive for 24 hours if you were given a sedative during your procedure.  Ask your health care provider if the medicine prescribed to you requires you to avoid driving or using heavy machinery. Activity  Rest as told by your health care provider.  Avoid sitting for a long time without moving. Get up to take short walks every 1-2 hours. This is important to improve blood flow and breathing. Ask for help if you feel weak or unsteady.  Return to your normal activities as told by your health care provider. Ask your health care provider what activities are safe for you. General instructions   Watch for any blood in your urine. Call your health care provider if the amount of blood in your urine increases.  If you have a catheter: ? Follow instructions  from your health care provider about taking care of your catheter and collection bag. ? Do not take baths, swim, or use a hot tub until your health care provider approves. Ask your health care provider if you may take showers. You may only be allowed to take sponge baths.  Drink enough fluid to keep your urine pale yellow.  Do not use any products that contain nicotine or tobacco, such as cigarettes, e-cigarettes, and chewing tobacco. These can delay healing after surgery. If you need help quitting, ask your health care provider.  Keep all follow-up visits as told by your health care provider. This is important. Contact a health care provider if:  You have pain that gets worse or does not get better with medicine, especially pain when you urinate.  You have difficulty urinating.  You feel nauseous or you vomit repeatedly during a period of more than 2 days after the procedure. Get help right away if:  Your urine is dark red or has blood clots in it.  You are leaking urine (have incontinence).  The end of the stent comes out of your urethra.  You cannot urinate.  You have sudden, sharp, or severe pain in your abdomen or lower back.  You have a fever.  You have swelling or pain in your legs.  You have difficulty breathing. Summary  After the procedure, it is common to have mild pain when you urinate that goes away within a few minutes after you urinate. This may last for up to 1 week.  Watch for any blood in your urine. Call your health care provider if the amount of blood in your urine increases.  Take over-the-counter and prescription medicines only as told by your health care provider.  Drink enough fluid to keep your urine pale yellow. This information is not intended to replace advice given to you by your health care provider. Make sure you discuss any questions you have with your health care provider. Document Revised: 05/03/2018 Document Reviewed: 05/04/2018 Elsevier  Patient Education  2020 [employer].

## 2020-03-27 ENCOUNTER — Telehealth: Payer: Self-pay | Admitting: Medical Oncology

## 2020-03-27 DIAGNOSIS — N10 Acute tubulo-interstitial nephritis: Secondary | ICD-10-CM | POA: Diagnosis not present

## 2020-03-27 DIAGNOSIS — E1140 Type 2 diabetes mellitus with diabetic neuropathy, unspecified: Secondary | ICD-10-CM | POA: Diagnosis not present

## 2020-03-27 DIAGNOSIS — J45909 Unspecified asthma, uncomplicated: Secondary | ICD-10-CM | POA: Diagnosis not present

## 2020-03-27 DIAGNOSIS — L989 Disorder of the skin and subcutaneous tissue, unspecified: Secondary | ICD-10-CM | POA: Diagnosis not present

## 2020-03-27 DIAGNOSIS — I10 Essential (primary) hypertension: Secondary | ICD-10-CM | POA: Diagnosis not present

## 2020-03-27 DIAGNOSIS — I700 Atherosclerosis of aorta: Secondary | ICD-10-CM | POA: Diagnosis not present

## 2020-03-27 DIAGNOSIS — N132 Hydronephrosis with renal and ureteral calculous obstruction: Secondary | ICD-10-CM | POA: Diagnosis not present

## 2020-03-27 DIAGNOSIS — J690 Pneumonitis due to inhalation of food and vomit: Secondary | ICD-10-CM | POA: Diagnosis not present

## 2020-03-27 DIAGNOSIS — N179 Acute kidney failure, unspecified: Secondary | ICD-10-CM | POA: Diagnosis not present

## 2020-03-27 DIAGNOSIS — D509 Iron deficiency anemia, unspecified: Secondary | ICD-10-CM | POA: Diagnosis not present

## 2020-03-27 DIAGNOSIS — A4151 Sepsis due to Escherichia coli [E. coli]: Secondary | ICD-10-CM | POA: Diagnosis not present

## 2020-03-27 DIAGNOSIS — R1310 Dysphagia, unspecified: Secondary | ICD-10-CM | POA: Diagnosis not present

## 2020-03-27 NOTE — Telephone Encounter (Signed)
Discharged from [facility] yesterday 'sepsis with ARF".    Iron infusion Friday -Should she keep her iron infusion appt for Friday ?

## 2020-03-27 NOTE — Telephone Encounter (Signed)
Delay to next week. Thank you

## 2020-03-27 NOTE — Progress Notes (Addendum)
COVID Vaccine Completed:  No Date COVID Vaccine completed: COVID vaccine manufacturer: [place]   PCP - Carlyle Lipa, NP Cardiologist - Not recently  Chest x-ray - 03-24-2020 in Epic EKG - 03-19-20 in Epic Stress Test -  ECHO - 07-24-16 in Epic Cardiac Cath -   Sleep Study - in the past, negative for sleep apnea CPAP -   Fasting Blood Sugar - 120 to 170 Checks Blood Sugar three times a day  Blood Thinner Instructions: Aspirin Instructions: Last Dose:  Anesthesia review: Acute respiratory failure, hx of aspiration into airway  Patient denies shortness of breath, fever, cough and chest pain at PAT appointment   Patient verbalized understanding of instructions that were given to them at the PAT appointment. Patient was also instructed that they will need to review over the PAT instructions again at home before surgery.

## 2020-03-27 NOTE — Patient Instructions (Addendum)
DUE TO COVID-19 ONLY ONE VISITOR IS ALLOWED TO COME WITH YOU AND STAY IN THE WAITING ROOM ONLY DURING PRE  OP AND PROCEDURE.   IF YOU WILL BE ADMITTED INTO THE HOSPITAL YOU ARE ALLOWED ONE SUPPORT PERSON DURING VISITATION HOURS  ONLY  (10AM -8PM)   . The support person may change daily. . The support person must pass our screening, gel in and out, and wear a mask at all times, including in the patient's room. . Patients must also wear a mask when staff or their support person are in the room.   COVID SWAB TESTING MUST BE COMPLETED ON:  Friday, 04-05-20 @ 12:00 PM    [address] [place], [place] [id number]  (Must self quarantine after testing. Follow instructions on handout.)   Your procedure is scheduled on:  Tuesday, 04-09-20   Report to [facility] Main  Entrance      Report to admitting at 10:45 AM   Call this number if you have problems the morning of surgery [phone]   Do not eat food :After Midnight.  May have clear liquids until 9:30 AM  CLEAR LIQUID DIET  Foods Allowed                                                                     Foods Excluded  Water, Black Coffee and tea, regular and decaf             liquids that you cannot  Plain Jell-O in any flavor  (No red)                                   see through such as: Fruit ices (not with fruit pulp)                                      milk, soups, orange juice  Iced Popsicles (No red)                                     All solid food                                   Apple juices Sports drinks like Gatorade (No red) Lightly seasoned clear broth or consume(fat free) Sugar, honey syrup   Oral Hygiene is also important to reduce your risk of infection.                                    Remember - BRUSH YOUR TEETH THE MORNING OF SURGERY WITH YOUR REGULAR TOOTHPASTE   Do NOT smoke after Midnight   Take these medicines the morning of surgery with A SIP OF WATER: Alprazolam, Atorvastatin,  Omeprazole, Oxycodone, Pregabalin,  Tamsulosin  DO NOT TAKE ANY ORAL DIABETIC MEDICATIONS DAY OF YOUR SURGERY  How to Manage Your Diabetes Before and After Surgery  Why is it important to control my blood sugar before and after surgery? . Improving blood sugar levels before and after surgery helps healing and can limit problems. . A way of improving blood sugar control is eating a healthy diet by: o  Eating less sugar and carbohydrates o  Increasing activity/exercise o  Talking with your doctor about reaching your blood sugar goals . High blood sugars (greater than 180 mg/dL) can raise your risk of infections and slow your recovery, so you will need to focus on controlling your diabetes during the weeks before surgery. . Make sure that the doctor who takes care of your diabetes knows about your planned surgery including the date and location.  How do I manage my blood sugar before surgery? . Check your blood sugar at least 4 times a day, starting 2 days before surgery, to make sure that the level is not too high or low. o Check your blood sugar the morning of your surgery when you wake up and every 2 hours until you get to the Short Stay unit. . If your blood sugar is less than 70 mg/dL, you will need to treat for low blood sugar: o Do not take insulin. o Treat a low blood sugar (less than 70 mg/dL) with  cup of clear juice (cranberry or apple), 4 glucose tablets, OR glucose gel. o Recheck blood sugar in 15 minutes after treatment (to make sure it is greater than 70 mg/dL). If your blood sugar is not greater than 70 mg/dL on recheck, call [phone] for further instructions. . Report your blood sugar to the short stay nurse when you get to Short Stay.  . If you are admitted to the hospital after surgery: o Your blood sugar will be checked by the staff and you will probably be given insulin after surgery (instead of oral diabetes medicines) to make sure you have good blood  sugar levels. o The goal for blood sugar control after surgery is 80-180 mg/dL.                You may not have any metal on your body including hair pins, jewelry, and body piercings             Do not wear make-up, lotions, powders, perfumes/cologne, or deodorant             Do not wear nail polish.  Do not shave  48 hours prior to surgery.                Do not bring valuables to the hospital. [place].   Contacts, dentures or bridgework may not be worn into surgery.   Bring small overnight bag day of surgery.    Patients discharged the day of surgery will not be allowed to drive home.   Special Instructions: Bring a copy of your healthcare power of attorney and living will documents         the day of surgery if you haven't  scanned them in before.              Please read over the following fact sheets you were given: IF YOU HAVE QUESTIONS ABOUT YOUR PRE OP INSTRUCTIONS PLEASE  CALL [phone]   [place] - Preparing for Surgery Before surgery, you can play an important role.  Because skin is not sterile, your skin needs to be as free  of germs as possible.  You can reduce the number of germs on your skin by washing with CHG (chlorahexidine gluconate) soap before surgery.  CHG is an antiseptic cleaner which kills germs and bonds with the skin to continue killing germs even after washing. Please DO NOT use if you have an allergy to CHG or antibacterial soaps.  If your skin becomes reddened/irritated stop using the CHG and inform your nurse when you arrive at Short Stay. Do not shave (including legs and underarms) for at least 48 hours prior to the first CHG shower.  You may shave your face/neck.  Please follow these instructions carefully:  1.  Shower with CHG Soap the night before surgery and the  morning of surgery.  2.  If you choose to wash your hair, wash your hair first as usual with your normal  shampoo.  3.  After you shampoo, rinse  your hair and body thoroughly to remove the shampoo.                             4.  Use CHG as you would any other liquid soap.  You can apply chg directly to the skin and wash.  Gently with a scrungie or clean washcloth.  5.  Apply the CHG Soap to your body ONLY FROM THE NECK DOWN.   Do   not use on face/ open                           Wound or open sores. Avoid contact with eyes, ears mouth and   genitals (private parts).                       Wash face,  Genitals (private parts) with your normal soap.             6.  Wash thoroughly, paying special attention to the area where your    surgery  will be performed.  7.  Thoroughly rinse your body with warm water from the neck down.  8.  DO NOT shower/wash with your normal soap after using and rinsing off the CHG Soap.                9.  Pat yourself dry with a clean towel.            10.  Wear clean pajamas.            11.  Place clean sheets on your bed the night of your first shower and do not  sleep with pets. Day of Surgery : Do not apply any lotions/deodorants the morning of surgery.  Please wear clean clothes to the hospital/surgery center.  FAILURE TO FOLLOW THESE INSTRUCTIONS MAY RESULT IN THE CANCELLATION OF YOUR SURGERY  PATIENT SIGNATURE_________________________________  NURSE SIGNATURE__________________________________  ________________________________________________________________________

## 2020-03-28 DIAGNOSIS — N183 Chronic kidney disease, stage 3 unspecified: Secondary | ICD-10-CM | POA: Diagnosis not present

## 2020-03-28 DIAGNOSIS — R6521 Severe sepsis with septic shock: Secondary | ICD-10-CM | POA: Diagnosis not present

## 2020-03-28 DIAGNOSIS — N179 Acute kidney failure, unspecified: Secondary | ICD-10-CM | POA: Diagnosis not present

## 2020-03-28 DIAGNOSIS — A419 Sepsis, unspecified organism: Secondary | ICD-10-CM | POA: Diagnosis not present

## 2020-03-28 DIAGNOSIS — N23 Unspecified renal colic: Secondary | ICD-10-CM | POA: Diagnosis not present

## 2020-03-28 DIAGNOSIS — Z79899 Other long term (current) drug therapy: Secondary | ICD-10-CM | POA: Diagnosis not present

## 2020-03-29 ENCOUNTER — Ambulatory Visit: Payer: Medicare Other

## 2020-04-03 ENCOUNTER — Encounter (HOSPITAL_COMMUNITY): Payer: Self-pay

## 2020-04-03 ENCOUNTER — Encounter (HOSPITAL_COMMUNITY)
Admission: RE | Admit: 2020-04-03 | Discharge: 2020-04-03 | Disposition: A | Discharge status: Home / Self Care | Payer: Medicare Other | Source: Ambulatory Visit | Attending: Urology | Admitting: Urology

## 2020-04-03 ENCOUNTER — Other Ambulatory Visit: Payer: Self-pay

## 2020-04-03 DIAGNOSIS — Z01812 Encounter for preprocedural laboratory examination: Secondary | ICD-10-CM | POA: Insufficient documentation

## 2020-04-03 LAB — BASIC METABOLIC PANEL
Anion gap: 7 (ref 5–15)
BUN: 15 mg/dL (ref 6–20)
CO2: 24 mmol/L (ref 22–32)
Calcium: 9.5 mg/dL (ref 8.9–10.3)
Chloride: 108 mmol/L (ref 98–111)
Creatinine, Ser: 0.94 mg/dL (ref 0.44–1.00)
GFR calc Af Amer: >60 mL/min (ref >60)
GFR calc non Af Amer: >60 mL/min (ref >60)
Glucose, Bld: 133 mg/dL — ABNORMAL HIGH (ref 70–99)
Potassium: 4.7 mmol/L (ref 3.5–5.1)
Sodium: 139 mmol/L (ref 135–145)

## 2020-04-03 LAB — CBC
HCT: 35.8 % — ABNORMAL LOW (ref 36.0–46.0)
Hemoglobin: 10.6 g/dL — ABNORMAL LOW (ref 12.0–15.0)
MCH: 26 pg (ref 26.0–34.0)
MCHC: 29.6 g/dL — ABNORMAL LOW (ref 30.0–36.0)
MCV: 88 fL (ref 80.0–100.0)
Platelets: 221 10*3/uL (ref 150–400)
RBC: 4.07 MIL/uL (ref 3.87–5.11)
RDW: 19.9 % — ABNORMAL HIGH (ref 11.5–15.5)
WBC: 5.8 10*3/uL (ref 4.0–10.5)
nRBC: 0 % (ref 0.0–0.2)

## 2020-04-03 LAB — GLUCOSE, CAPILLARY: Glucose-Capillary: 144 mg/dL — ABNORMAL HIGH (ref 70–99)

## 2020-04-03 NOTE — Progress Notes (Signed)
CBC sent to Dr. Junious Silk to review.

## 2020-04-04 DIAGNOSIS — A4151 Sepsis due to Escherichia coli [E. coli]: Secondary | ICD-10-CM | POA: Diagnosis not present

## 2020-04-04 DIAGNOSIS — L989 Disorder of the skin and subcutaneous tissue, unspecified: Secondary | ICD-10-CM | POA: Diagnosis not present

## 2020-04-04 DIAGNOSIS — N132 Hydronephrosis with renal and ureteral calculous obstruction: Secondary | ICD-10-CM | POA: Diagnosis not present

## 2020-04-04 DIAGNOSIS — D509 Iron deficiency anemia, unspecified: Secondary | ICD-10-CM | POA: Diagnosis not present

## 2020-04-04 DIAGNOSIS — N10 Acute tubulo-interstitial nephritis: Secondary | ICD-10-CM | POA: Diagnosis not present

## 2020-04-04 DIAGNOSIS — I10 Essential (primary) hypertension: Secondary | ICD-10-CM | POA: Diagnosis not present

## 2020-04-04 DIAGNOSIS — J45909 Unspecified asthma, uncomplicated: Secondary | ICD-10-CM | POA: Diagnosis not present

## 2020-04-04 DIAGNOSIS — I700 Atherosclerosis of aorta: Secondary | ICD-10-CM | POA: Diagnosis not present

## 2020-04-04 DIAGNOSIS — E1140 Type 2 diabetes mellitus with diabetic neuropathy, unspecified: Secondary | ICD-10-CM | POA: Diagnosis not present

## 2020-04-04 DIAGNOSIS — R1310 Dysphagia, unspecified: Secondary | ICD-10-CM | POA: Diagnosis not present

## 2020-04-04 DIAGNOSIS — J690 Pneumonitis due to inhalation of food and vomit: Secondary | ICD-10-CM | POA: Diagnosis not present

## 2020-04-04 DIAGNOSIS — N179 Acute kidney failure, unspecified: Secondary | ICD-10-CM | POA: Diagnosis not present

## 2020-04-05 ENCOUNTER — Other Ambulatory Visit (HOSPITAL_COMMUNITY)
Admission: RE | Admit: 2020-04-05 | Discharge: 2020-04-05 | Disposition: A | Discharge status: Home / Self Care | Payer: Medicare Other | Source: Ambulatory Visit | Attending: Urology | Admitting: Urology

## 2020-04-05 ENCOUNTER — Inpatient Hospital Stay: Payer: Medicare Other

## 2020-04-05 DIAGNOSIS — Z20822 Contact with and (suspected) exposure to covid-19: Secondary | ICD-10-CM | POA: Insufficient documentation

## 2020-04-05 DIAGNOSIS — Z01812 Encounter for preprocedural laboratory examination: Secondary | ICD-10-CM | POA: Insufficient documentation

## 2020-04-05 LAB — SARS CORONAVIRUS 2 (TAT 6-24 HRS): SARS Coronavirus 2: NEGATIVE

## 2020-04-08 NOTE — Progress Notes (Signed)
Spoke to patient and she was advised of new surgery time and new arrival time of 1400.  Patient was also advised that she had could have clear liquids until 1300.  Patient stated understanding.

## 2020-04-09 ENCOUNTER — Encounter (HOSPITAL_COMMUNITY): Payer: Self-pay | Admitting: Urology

## 2020-04-09 ENCOUNTER — Ambulatory Visit (HOSPITAL_COMMUNITY): Payer: Medicare Other

## 2020-04-09 ENCOUNTER — Ambulatory Visit (HOSPITAL_COMMUNITY): Payer: Medicare Other | Admitting: Anesthesiology

## 2020-04-09 ENCOUNTER — Ambulatory Visit (HOSPITAL_COMMUNITY)
Admission: RE | Admit: 2020-04-09 | Discharge: 2020-04-09 | Disposition: A | Discharge status: Home / Self Care | Payer: Medicare Other | Attending: Urology | Admitting: Urology

## 2020-04-09 ENCOUNTER — Encounter (HOSPITAL_COMMUNITY)
Admission: RE | Disposition: A | Discharge status: Home / Self Care | Payer: Self-pay | Source: Home / Self Care | Attending: Urology

## 2020-04-09 DIAGNOSIS — Z7984 Long term (current) use of oral hypoglycemic drugs: Secondary | ICD-10-CM | POA: Insufficient documentation

## 2020-04-09 DIAGNOSIS — Z7951 Long term (current) use of inhaled steroids: Secondary | ICD-10-CM | POA: Diagnosis not present

## 2020-04-09 DIAGNOSIS — Z885 Allergy status to narcotic agent status: Secondary | ICD-10-CM | POA: Diagnosis not present

## 2020-04-09 DIAGNOSIS — Z833 Family history of diabetes mellitus: Secondary | ICD-10-CM | POA: Insufficient documentation

## 2020-04-09 DIAGNOSIS — N132 Hydronephrosis with renal and ureteral calculous obstruction: Secondary | ICD-10-CM | POA: Diagnosis present

## 2020-04-09 DIAGNOSIS — Z79899 Other long term (current) drug therapy: Secondary | ICD-10-CM | POA: Insufficient documentation

## 2020-04-09 DIAGNOSIS — J45909 Unspecified asthma, uncomplicated: Secondary | ICD-10-CM | POA: Insufficient documentation

## 2020-04-09 DIAGNOSIS — E785 Hyperlipidemia, unspecified: Secondary | ICD-10-CM | POA: Insufficient documentation

## 2020-04-09 DIAGNOSIS — Z8249 Family history of ischemic heart disease and other diseases of the circulatory system: Secondary | ICD-10-CM | POA: Insufficient documentation

## 2020-04-09 DIAGNOSIS — E119 Type 2 diabetes mellitus without complications: Secondary | ICD-10-CM | POA: Diagnosis not present

## 2020-04-09 DIAGNOSIS — Z87440 Personal history of urinary (tract) infections: Secondary | ICD-10-CM | POA: Diagnosis not present

## 2020-04-09 DIAGNOSIS — I10 Essential (primary) hypertension: Secondary | ICD-10-CM | POA: Insufficient documentation

## 2020-04-09 DIAGNOSIS — N202 Calculus of kidney with calculus of ureter: Secondary | ICD-10-CM | POA: Insufficient documentation

## 2020-04-09 DIAGNOSIS — Z880 Allergy status to penicillin: Secondary | ICD-10-CM | POA: Diagnosis not present

## 2020-04-09 DIAGNOSIS — K219 Gastro-esophageal reflux disease without esophagitis: Secondary | ICD-10-CM | POA: Diagnosis not present

## 2020-04-09 DIAGNOSIS — K589 Irritable bowel syndrome without diarrhea: Secondary | ICD-10-CM | POA: Insufficient documentation

## 2020-04-09 DIAGNOSIS — Z7982 Long term (current) use of aspirin: Secondary | ICD-10-CM | POA: Diagnosis not present

## 2020-04-09 DIAGNOSIS — Z87442 Personal history of urinary calculi: Secondary | ICD-10-CM | POA: Diagnosis not present

## 2020-04-09 DIAGNOSIS — M797 Fibromyalgia: Secondary | ICD-10-CM | POA: Insufficient documentation

## 2020-04-09 DIAGNOSIS — Z888 Allergy status to other drugs, medicaments and biological substances status: Secondary | ICD-10-CM | POA: Insufficient documentation

## 2020-04-09 DIAGNOSIS — F17210 Nicotine dependence, cigarettes, uncomplicated: Secondary | ICD-10-CM | POA: Insufficient documentation

## 2020-04-09 DIAGNOSIS — F419 Anxiety disorder, unspecified: Secondary | ICD-10-CM | POA: Diagnosis not present

## 2020-04-09 HISTORY — PX: URETEROSCOPY WITH HOLMIUM LASER LITHOTRIPSY: SHX6645

## 2020-04-09 HISTORY — DX: Dyspnea, unspecified: R06.00

## 2020-04-09 LAB — GLUCOSE, CAPILLARY
Glucose-Capillary: 98 mg/dL (ref 70–99)
Glucose-Capillary: 122 mg/dL — ABNORMAL HIGH (ref 70–99)

## 2020-04-09 SURGERY — URETEROSCOPY, WITH LITHOTRIPSY USING HOLMIUM LASER
Anesthesia: General | Site: Ureter | Laterality: Left

## 2020-04-09 MED ORDER — ONDANSETRON HCL 4 MG/2ML IJ SOLN
INTRAMUSCULAR | Status: DC | PRN
  Administered 2020-04-09: 4 mg via INTRAVENOUS

## 2020-04-09 MED ORDER — HYDROMORPHONE HCL 1 MG/ML IJ SOLN: 0.2500 mg | INTRAMUSCULAR | Status: DC | PRN

## 2020-04-09 MED ORDER — SUCCINYLCHOLINE CHLORIDE 200 MG/10ML IV SOSY
PREFILLED_SYRINGE | INTRAVENOUS | Status: DC | PRN
  Administered 2020-04-09: 140 mg via INTRAVENOUS

## 2020-04-09 MED ORDER — MIDAZOLAM HCL 2 MG/2ML IJ SOLN
INTRAMUSCULAR | Status: AC
  Filled 2020-04-09: qty 2

## 2020-04-09 MED ORDER — SCOPOLAMINE 1 MG/3DAYS TD PT72
1.0000 | MEDICATED_PATCH | Freq: Once | TRANSDERMAL | Status: DC
  Administered 2020-04-09: 1.5 mg via TRANSDERMAL
  Filled 2020-04-09: qty 1

## 2020-04-09 MED ORDER — PROMETHAZINE HCL 25 MG/ML IJ SOLN: 6.2500 mg | INTRAMUSCULAR | Status: DC | PRN

## 2020-04-09 MED ORDER — MIDAZOLAM HCL 5 MG/5ML IJ SOLN
INTRAMUSCULAR | Status: DC | PRN
  Administered 2020-04-09: 2 mg via INTRAVENOUS

## 2020-04-09 MED ORDER — DEXAMETHASONE SODIUM PHOSPHATE 10 MG/ML IJ SOLN
INTRAMUSCULAR | Status: AC
  Filled 2020-04-09: qty 1

## 2020-04-09 MED ORDER — HYDROMORPHONE HCL 1 MG/ML IJ SOLN
INTRAMUSCULAR | Status: DC | PRN
  Administered 2020-04-09: 1 mg via INTRAVENOUS

## 2020-04-09 MED ORDER — PROPOFOL 10 MG/ML IV BOLUS
INTRAVENOUS | Status: DC | PRN
  Administered 2020-04-09: 200 mg via INTRAVENOUS

## 2020-04-09 MED ORDER — SODIUM CHLORIDE 0.9 % IR SOLN
Status: DC | PRN
  Administered 2020-04-09: 3000 mL

## 2020-04-09 MED ORDER — DEXAMETHASONE SODIUM PHOSPHATE 4 MG/ML IJ SOLN
INTRAMUSCULAR | Status: DC | PRN
  Administered 2020-04-09: 5 mg via INTRAVENOUS

## 2020-04-09 MED ORDER — DEXTROSE 50 % IV SOLN
INTRAVENOUS | Status: DC | PRN
  Administered 2020-04-09: 12.5 g via INTRAVENOUS

## 2020-04-09 MED ORDER — ORAL CARE MOUTH RINSE: 15.0000 mL | Freq: Once | OROMUCOSAL | Status: AC

## 2020-04-09 MED ORDER — OXYCODONE HCL 5 MG PO TABS: 5.0000 mg | ORAL_TABLET | Freq: Once | ORAL | Status: DC | PRN

## 2020-04-09 MED ORDER — ONDANSETRON HCL 4 MG/2ML IJ SOLN
INTRAMUSCULAR | Status: AC
  Filled 2020-04-09: qty 2

## 2020-04-09 MED ORDER — DEXTROSE 50 % IV SOLN
INTRAVENOUS | Status: AC
  Filled 2020-04-09: qty 50

## 2020-04-09 MED ORDER — ACETAMINOPHEN 500 MG PO TABS
1000.0000 mg | ORAL_TABLET | Freq: Once | ORAL | Status: AC
  Administered 2020-04-09: 1000 mg via ORAL
  Filled 2020-04-09: qty 2

## 2020-04-09 MED ORDER — SODIUM CHLORIDE 0.9 % IV SOLN
2.0000 g | Freq: Once | INTRAVENOUS | Status: AC
  Administered 2020-04-09: 2 g via INTRAVENOUS
  Filled 2020-04-09: qty 20

## 2020-04-09 MED ORDER — OXYCODONE HCL 5 MG/5ML PO SOLN: 5.0000 mg | Freq: Once | ORAL | Status: DC | PRN

## 2020-04-09 MED ORDER — SUCCINYLCHOLINE CHLORIDE 200 MG/10ML IV SOSY
PREFILLED_SYRINGE | INTRAVENOUS | Status: AC
  Filled 2020-04-09: qty 10

## 2020-04-09 MED ORDER — LIDOCAINE 2% (20 MG/ML) 5 ML SYRINGE
INTRAMUSCULAR | Status: DC | PRN
  Administered 2020-04-09: 60 mg via INTRAVENOUS

## 2020-04-09 MED ORDER — LACTATED RINGERS IV SOLN: INTRAVENOUS | Status: DC

## 2020-04-09 MED ORDER — HYDROMORPHONE HCL 2 MG/ML IJ SOLN
INTRAMUSCULAR | Status: AC
  Filled 2020-04-09: qty 1

## 2020-04-09 MED ORDER — CEFDINIR 300 MG PO CAPS: 300.0000 mg | ORAL_CAPSULE | Freq: Every morning | ORAL | 0 refills | Status: DC

## 2020-04-09 MED ORDER — CHLORHEXIDINE GLUCONATE 0.12 % MT SOLN
15.0000 mL | Freq: Once | OROMUCOSAL | Status: AC
  Administered 2020-04-09: 15 mL via OROMUCOSAL

## 2020-04-09 MED ORDER — EPHEDRINE SULFATE-NACL 50-0.9 MG/10ML-% IV SOSY
PREFILLED_SYRINGE | INTRAVENOUS | Status: DC | PRN
  Administered 2020-04-09: 5 mg via INTRAVENOUS

## 2020-04-09 MED ORDER — FENTANYL CITRATE (PF) 100 MCG/2ML IJ SOLN
INTRAMUSCULAR | Status: AC
  Filled 2020-04-09: qty 2

## 2020-04-09 MED ORDER — PROPOFOL 10 MG/ML IV BOLUS
INTRAVENOUS | Status: AC
  Filled 2020-04-09: qty 20

## 2020-04-09 SURGICAL SUPPLY — 22 items
BAG URO CATCHER STRL LF (MISCELLANEOUS) ×3 IMPLANT
BASKET ZERO TIP NITINOL 2.4FR (BASKET) ×2 IMPLANT
BSKT STON RTRVL ZERO TP 2.4FR (BASKET) ×1
CATH INTERMIT  6FR 70CM (CATHETERS) ×3 IMPLANT
CATH URET 5FR 28IN CONE TIP (BALLOONS)
CATH URET 5FR 70CM CONE TIP (BALLOONS) IMPLANT
CLOTH BEACON ORANGE TIMEOUT ST (SAFETY) ×3 IMPLANT
GLOVE BIO SURGEON STRL SZ7.5 (GLOVE) ×3 IMPLANT
GOWN STRL REUS W/TWL XL LVL3 (GOWN DISPOSABLE) ×3 IMPLANT
GUIDEWIRE STR DUAL SENSOR (WIRE) ×3 IMPLANT
KIT TURNOVER KIT A (KITS) IMPLANT
LASER FIB FLEXIVA PULSE ID 365 (Laser) IMPLANT
MANIFOLD NEPTUNE II (INSTRUMENTS) ×3 IMPLANT
PACK CYSTO (CUSTOM PROCEDURE TRAY) ×3 IMPLANT
SHEATH URETERAL 12FRX28CM (UROLOGICAL SUPPLIES) ×2 IMPLANT
SHEATH URETERAL 12FRX35CM (MISCELLANEOUS) IMPLANT
STENT URET 6FRX24 CONTOUR (STENTS) ×2 IMPLANT
TRACTIP FLEXIVA PULS ID 200XHI (Laser) IMPLANT
TRACTIP FLEXIVA PULSE ID 200 (Laser) ×3
TUBING CONNECTING 10 (TUBING) ×2 IMPLANT
TUBING CONNECTING 10' (TUBING) ×1
TUBING UROLOGY SET (TUBING) ×3 IMPLANT

## 2020-04-09 NOTE — Transfer of Care (Signed)
Immediate Anesthesia Transfer of Care Note  Patient: Gina [name]  Procedure(s) Performed: URETEROSCOPY WITH HOLMIUM LASER LITHOTRIPSY/ STENT CHANGE (Left Ureter)  Patient Location: PACU  Anesthesia Type:General  Level of Consciousness: awake, alert , oriented and patient cooperative  Airway & Oxygen Therapy: Patient Spontanous Breathing and Patient connected to face mask  Post-op Assessment: Report given to RN and Post -op Vital signs reviewed and stable  Post vital signs: Reviewed and stable  Last Vitals:  Vitals Value Taken Time  BP 158/79 04/09/20 1725  Temp    Pulse 80 04/09/20 1728  Resp 17 04/09/20 1728  SpO2 98 % 04/09/20 1728  Vitals shown include unvalidated device data.  Last Pain:  Vitals:   04/09/20 1359  TempSrc: Oral  PainSc: 7       Patients Stated Pain Goal: 5 (72/09/19 8022)  Complications: No complications documented.

## 2020-04-09 NOTE — Anesthesia Preprocedure Evaluation (Addendum)
Anesthesia Evaluation  Patient identified by MRN, date of birth, ID band Patient awake    Reviewed: Allergy & Precautions, NPO status , Patient's Chart, lab work & pertinent test results  History of Anesthesia Complications (+) PONV and history of anesthetic complications  Airway Mallampati: III  TM Distance: >3 FB Neck ROM: Full    Dental  (+) Dental Advisory Given, Poor Dentition, Missing, Chipped,    Pulmonary asthma , Current Smoker and Patient abstained from smoking.,  Current smoker, 33 pack year history- 1.5ppd  Inhalers: last used albuterol a few days ago   Pulmonary exam normal breath sounds clear to auscultation       Cardiovascular hypertension, Pt. on medications Normal cardiovascular exam Rhythm:Regular Rate:Normal  Stress test 2016: nml   Neuro/Psych  Headaches, PSYCHIATRIC DISORDERS Anxiety    GI/Hepatic Neg liver ROS, GERD  Medicated and Controlled,  Endo/Other  diabetes, Well Controlled, Type 2, Insulin Dependent, Oral Hypoglycemic AgentsMorbid obesityBMI 52 Last a1c 5.8 FS 98- feels symptomatic at less than 120  Renal/GU Renal diseaseL ureteral and renal stones   negative genitourinary   Musculoskeletal  (+) Arthritis , Osteoarthritis,  Fibromyalgia -  Abdominal (+) + obese,   Peds  Hematology  (+) Blood dyscrasia, anemia , hct 35.8, plt 221   Anesthesia Other Findings   Reproductive/Obstetrics                            Anesthesia Physical Anesthesia Plan  ASA: III  Anesthesia Plan: General   Post-op Pain Management:    Induction: Intravenous and Rapid sequence  PONV Risk Score and Plan: 4 or greater and Ondansetron, Dexamethasone, Midazolam, Scopolamine patch - Pre-op and Treatment may vary due to age or medical condition  Airway Management Planned: Oral ETT  Additional Equipment: None  Intra-op Plan:   Post-operative Plan: Extubation in OR  Informed  Consent: I have reviewed the patients History and Physical, chart, labs and discussed the procedure including the risks, benefits and alternatives for the proposed anesthesia with the patient or authorized representative who has indicated his/her understanding and acceptance.     Dental advisory given  Plan Discussed with: CRNA  Anesthesia Plan Comments: (Refusing fentanyl- says it makes her crazy  preop FS 98, pt thinks she may be starting to get symptomatically hypoglycemic, will give dextrose intraop.)      Anesthesia Quick Evaluation

## 2020-04-09 NOTE — Op Note (Signed)
Preoperative diagnosis: Left ureteral stone, left renal stones Postoperative diagnosis: Same  Procedure: Cystoscopy, left ureteroscopy laser lithotripsy, left stent exchange  Surgeon: Junious Silk  Anesthesia: General  Indication for procedure: 54 year old female stented urgently a few weeks ago for left distal stone and sepsis.  She also has some left lower pole stones.  And a small left mid to lower pole stone.  She was brought today for definitive stone management.  Findings: Urethra and bladder unremarkable.  Urine clear.  On left ureteroscopy stone noted in the left distal ureter.  On renoscopy stones noted in left lower pole.  I did not isolate a 4 mm mid to lower pole stone  Excellent stone fragmentation/dusting noted.  Description of procedure: After consent was obtained patient brought to operating room.  After adequate anesthesia was placed in lithotomy position and prepped and draped in the usual sterile fashion.  A timeout was performed to confirm the patient and procedure.  Cystoscope was passed per urethra and the left ureteral stent grasped and removed through the urethral meatus.  A sensor wire was advanced and coiled in the collecting system.  A semirigid ureteroscope was advanced adjacent to the wire and the stone in the distal ureter was located.  It was dusted at a setting of 0.3 and 60.  I was then able to go proximally and no other stones were noted.  A Glidewire was placed under direct vision and the semirigid scope backed out.  A ureteral access sheath was passed without difficulty leaving the sensor wires the safety wire.  A dual channel digital ureteroscope was advanced into the kidney where a large lower pole stones were noted.  The stones appeared to have been previously fragmented.  The largest stone and stone fragments were brought to the upper pole with a 0 tip basket and dropped in the upper pole calyx for ease of fragmentation.  The lower pole stones were dusted.  The  upper pole stones were dusted.  No other stones were noted.  Fragmentation was excellent.  No residual stones were noted on fluoroscopy.  The lower pole stones had been visible on fluoroscopy.  The access sheath backed out on the ureteroscope and the collecting system and renal pelvis inspected again and noted to be normal.  The ureter was inspected on the way out noted to be normal without stone fragment or injury.  Wire was backloaded on the cystoscope and a 6 x 24 cm stent advanced.  The wire was removed with a good coil seen in the kidney and a good coil in the bladder.  She was awakened and taken recovery room in stable condition.  Complications: None  Blood loss: Minimal  Specimens: None  Drains: 6 x 24 cm left ureteral stent with string  Disposition: Patient stable to PACU

## 2020-04-09 NOTE — Anesthesia Postprocedure Evaluation (Signed)
Anesthesia Post Note  Patient: Gina [name]  Procedure(s) Performed: URETEROSCOPY WITH HOLMIUM LASER LITHOTRIPSY/ STENT CHANGE (Left Ureter)     Patient location during evaluation: PACU Anesthesia Type: General Level of consciousness: awake and alert, oriented and patient cooperative Pain management: pain level controlled Vital Signs Assessment: post-procedure vital signs reviewed and stable Respiratory status: spontaneous breathing, nonlabored ventilation and respiratory function stable Cardiovascular status: blood pressure returned to baseline and stable Postop Assessment: no apparent nausea or vomiting Anesthetic complications: no   No complications documented.  Last Vitals:  Vitals:   04/09/20 1730 04/09/20 1745  BP: (!) 144/67 139/73  Pulse: 76 73  Resp: 16 17  Temp:  36.6 C  SpO2: 100% 94%    Last Pain:  Vitals:   04/09/20 1745  TempSrc:   PainSc: 0-No pain                 Pervis Hocking

## 2020-04-09 NOTE — Anesthesia Procedure Notes (Signed)
Procedure Name: Intubation Date/Time: 04/09/2020 4:11 PM Performed by: Claudia Desanctis, CRNA Pre-anesthesia Checklist: Patient identified, Emergency Drugs available, Suction available and Patient being monitored Patient Re-evaluated:Patient Re-evaluated prior to induction Oxygen Delivery Method: Circle system utilized Preoxygenation: Pre-oxygenation with 100% oxygen Induction Type: IV induction Ventilation: Mask ventilation without difficulty Laryngoscope Size: 2 and Miller Grade View: Grade I Tube type: Oral Tube size: 7.0 mm Number of attempts: 1 Airway Equipment and Method: Stylet Placement Confirmation: ETT inserted through vocal cords under direct vision,  positive ETCO2,  breath sounds checked- equal and bilateral and CO2 detector Secured at: 21 cm Tube secured with: Tape Dental Injury: Teeth and Oropharynx as per pre-operative assessment

## 2020-04-09 NOTE — Interval H&P Note (Signed)
History and Physical Interval Note:  04/09/2020 3:46 PM  Gina [name]  has presented today for surgery, with the diagnosis of LEFT URETERAL AND RENAL STONES.  The various methods of treatment have been discussed with the patient and family. After consideration of risks, benefits and other options for treatment, the patient has consented to  Procedure(s): URETEROSCOPY WITH HOLMIUM LASER LITHOTRIPSY/ STENT CHANGE (Left) as a surgical intervention. She has been on cefdinir Qam and NF QHS. Urine cloudiness resolved and no dysuria or fever. The patient's history has been reviewed, patient examined, no change in status, stable for surgery.  I have reviewed the patient's chart and labs.  Questions were answered to the patient's satisfaction.  DIscussed we may or may not be able to clear all the renal stones on the left. Discussed post-op care, stent, tether, etc.    Festus Aloe

## 2020-04-09 NOTE — Discharge Instructions (Signed)
Ureteral Stent Implantation, Care After This sheet gives you information about how to care for yourself after your procedure. Your health care provider may also give you more specific instructions. If you have problems or questions, contact your health care provider.  Removal of the stent: Remove the stent by pulling the string on Monday morning April 15, 2020  What can I expect after the procedure? After the procedure, it is common to have:  Nausea.  Mild pain when you urinate. You may feel this pain in your lower back or lower abdomen. The pain should stop within a few minutes after you urinate. This may last for up to 1 week.  A small amount of blood in your urine for several days. Follow these instructions at home: Medicines  Take over-the-counter and prescription medicines only as told by your health care provider.  If you were prescribed an antibiotic medicine, take it as told by your health care provider. Do not stop taking the antibiotic even if you start to feel better.  Do not drive for 24 hours if you were given a sedative during your procedure.  Ask your health care provider if the medicine prescribed to you requires you to avoid driving or using heavy machinery. Activity  Rest as told by your health care provider.  Avoid sitting for a long time without moving. Get up to take short walks every 1-2 hours. This is important to improve blood flow and breathing. Ask for help if you feel weak or unsteady.  Return to your normal activities as told by your health care provider. Ask your health care provider what activities are safe for you. General instructions   Watch for any blood in your urine. Call your health care provider if the amount of blood in your urine increases.  If you have a catheter: ? Follow instructions from your health care provider about taking care of your catheter and collection bag. ? Do not take baths, swim, or use a hot tub until your health care  provider approves. Ask your health care provider if you may take showers. You may only be allowed to take sponge baths.  Drink enough fluid to keep your urine pale yellow.  Do not use any products that contain nicotine or tobacco, such as cigarettes, e-cigarettes, and chewing tobacco. These can delay healing after surgery. If you need help quitting, ask your health care provider.  Keep all follow-up visits as told by your health care provider. This is important. Contact a health care provider if:  You have pain that gets worse or does not get better with medicine, especially pain when you urinate.  You have difficulty urinating.  You feel nauseous or you vomit repeatedly during a period of more than 2 days after the procedure. Get help right away if:  Your urine is dark red or has blood clots in it.  You are leaking urine (have incontinence).  The end of the stent comes out of your urethra.  You cannot urinate.  You have sudden, sharp, or severe pain in your abdomen or lower back.  You have a fever.  You have swelling or pain in your legs.  You have difficulty breathing. Summary  After the procedure, it is common to have mild pain when you urinate that goes away within a few minutes after you urinate. This may last for up to 1 week.  Watch for any blood in your urine. Call your health care provider if the amount of blood in  your urine increases.  Take over-the-counter and prescription medicines only as told by your health care provider.  Drink enough fluid to keep your urine pale yellow. This information is not intended to replace advice given to you by your health care provider. Make sure you discuss any questions you have with your health care provider. Document Revised: 05/03/2018 Document Reviewed: 05/04/2018 Elsevier Patient Education  2020 [place] Anesthesia, Adult, Care After This sheet gives you information about how to care for yourself after  your procedure. Your health care provider may also give you more specific instructions. If you have problems or questions, contact your health care provider. What can I expect after the procedure? After the procedure, the following side effects are common:  Pain or discomfort at the IV site.  Nausea.  Vomiting.  Sore throat.  Trouble concentrating.  Feeling cold or chills.  Weak or tired.  Sleepiness and fatigue.  Soreness and body aches. These side effects can affect parts of the body that were not involved in surgery. Follow these instructions at home:  For at least 24 hours after the procedure:  Have a responsible adult stay with you. It is important to have someone help care for you until you are awake and alert.  Rest as needed.  Do not: ? Participate in activities in which you could fall or become injured. ? Drive. ? Use heavy machinery. ? Drink alcohol. ? Take sleeping pills or medicines that cause drowsiness. ? Make important decisions or sign legal documents. ? Take care of children on your own. Eating and drinking  Follow any instructions from your health care provider about eating or drinking restrictions.  When you feel hungry, start by eating small amounts of foods that are soft and easy to digest (bland), such as toast. Gradually return to your regular diet.  Drink enough fluid to keep your urine pale yellow.  If you vomit, rehydrate by drinking water, juice, or clear broth. General instructions  If you have sleep apnea, surgery and certain medicines can increase your risk for breathing problems. Follow instructions from your health care provider about wearing your sleep device: ? Anytime you are sleeping, including during daytime naps. ? While taking prescription pain medicines, sleeping medicines, or medicines that make you drowsy.  Return to your normal activities as told by your health care provider. Ask your health care provider what activities  are safe for you.  Take over-the-counter and prescription medicines only as told by your health care provider.  If you smoke, do not smoke without supervision.  Keep all follow-up visits as told by your health care provider. This is important. Contact a health care provider if:  You have nausea or vomiting that does not get better with medicine.  You cannot eat or drink without vomiting.  You have pain that does not get better with medicine.  You are unable to pass urine.  You develop a skin rash.  You have a fever.  You have redness around your IV site that gets worse. Get help right away if:  You have difficulty breathing.  You have chest pain.  You have blood in your urine or stool, or you vomit blood. Summary  After the procedure, it is common to have a sore throat or nausea. It is also common to feel tired.  Have a responsible adult stay with you for the first 24 hours after general anesthesia. It is important to have someone help care for you until  you are awake and alert.  When you feel hungry, start by eating small amounts of foods that are soft and easy to digest (bland), such as toast. Gradually return to your regular diet.  Drink enough fluid to keep your urine pale yellow.  Return to your normal activities as told by your health care provider. Ask your health care provider what activities are safe for you. This information is not intended to replace advice given to you by your health care provider. Make sure you discuss any questions you have with your health care provider. Document Revised: 07/30/2017 Document Reviewed: 03/12/2017 Elsevier Patient Education  [place].

## 2020-04-10 ENCOUNTER — Encounter (HOSPITAL_COMMUNITY): Payer: Self-pay | Admitting: Urology

## 2020-05-14 DIAGNOSIS — N200 Calculus of kidney: Secondary | ICD-10-CM | POA: Diagnosis not present

## 2020-05-14 DIAGNOSIS — N3020 Other chronic cystitis without hematuria: Secondary | ICD-10-CM | POA: Diagnosis not present

## 2020-05-16 DIAGNOSIS — J690 Pneumonitis due to inhalation of food and vomit: Secondary | ICD-10-CM | POA: Diagnosis not present

## 2020-05-16 DIAGNOSIS — I10 Essential (primary) hypertension: Secondary | ICD-10-CM | POA: Diagnosis not present

## 2020-05-16 DIAGNOSIS — L989 Disorder of the skin and subcutaneous tissue, unspecified: Secondary | ICD-10-CM | POA: Diagnosis not present

## 2020-05-16 DIAGNOSIS — I700 Atherosclerosis of aorta: Secondary | ICD-10-CM | POA: Diagnosis not present

## 2020-05-16 DIAGNOSIS — N10 Acute tubulo-interstitial nephritis: Secondary | ICD-10-CM | POA: Diagnosis not present

## 2020-05-16 DIAGNOSIS — R1310 Dysphagia, unspecified: Secondary | ICD-10-CM | POA: Diagnosis not present

## 2020-05-16 DIAGNOSIS — E1140 Type 2 diabetes mellitus with diabetic neuropathy, unspecified: Secondary | ICD-10-CM | POA: Diagnosis not present

## 2020-05-16 DIAGNOSIS — D509 Iron deficiency anemia, unspecified: Secondary | ICD-10-CM | POA: Diagnosis not present

## 2020-05-16 DIAGNOSIS — N132 Hydronephrosis with renal and ureteral calculous obstruction: Secondary | ICD-10-CM | POA: Diagnosis not present

## 2020-05-16 DIAGNOSIS — N179 Acute kidney failure, unspecified: Secondary | ICD-10-CM | POA: Diagnosis not present

## 2020-05-16 DIAGNOSIS — J45909 Unspecified asthma, uncomplicated: Secondary | ICD-10-CM | POA: Diagnosis not present

## 2020-05-20 DIAGNOSIS — E11621 Type 2 diabetes mellitus with foot ulcer: Secondary | ICD-10-CM | POA: Diagnosis not present

## 2020-05-20 DIAGNOSIS — Z0000 Encounter for general adult medical examination without abnormal findings: Secondary | ICD-10-CM | POA: Diagnosis not present

## 2020-05-20 DIAGNOSIS — N183 Chronic kidney disease, stage 3 unspecified: Secondary | ICD-10-CM | POA: Diagnosis not present

## 2020-05-20 DIAGNOSIS — L97521 Non-pressure chronic ulcer of other part of left foot limited to breakdown of skin: Secondary | ICD-10-CM | POA: Diagnosis not present

## 2020-05-20 DIAGNOSIS — E559 Vitamin D deficiency, unspecified: Secondary | ICD-10-CM | POA: Diagnosis not present

## 2020-05-20 DIAGNOSIS — E1165 Type 2 diabetes mellitus with hyperglycemia: Secondary | ICD-10-CM | POA: Diagnosis not present

## 2020-05-21 DIAGNOSIS — E1140 Type 2 diabetes mellitus with diabetic neuropathy, unspecified: Secondary | ICD-10-CM | POA: Diagnosis not present

## 2020-05-21 DIAGNOSIS — I700 Atherosclerosis of aorta: Secondary | ICD-10-CM | POA: Diagnosis not present

## 2020-05-21 DIAGNOSIS — J45909 Unspecified asthma, uncomplicated: Secondary | ICD-10-CM | POA: Diagnosis not present

## 2020-05-21 DIAGNOSIS — L989 Disorder of the skin and subcutaneous tissue, unspecified: Secondary | ICD-10-CM | POA: Diagnosis not present

## 2020-05-21 DIAGNOSIS — J690 Pneumonitis due to inhalation of food and vomit: Secondary | ICD-10-CM | POA: Diagnosis not present

## 2020-05-21 DIAGNOSIS — I10 Essential (primary) hypertension: Secondary | ICD-10-CM | POA: Diagnosis not present

## 2020-05-21 DIAGNOSIS — R1310 Dysphagia, unspecified: Secondary | ICD-10-CM | POA: Diagnosis not present

## 2020-05-21 DIAGNOSIS — N179 Acute kidney failure, unspecified: Secondary | ICD-10-CM | POA: Diagnosis not present

## 2020-05-21 DIAGNOSIS — N132 Hydronephrosis with renal and ureteral calculous obstruction: Secondary | ICD-10-CM | POA: Diagnosis not present

## 2020-05-21 DIAGNOSIS — D509 Iron deficiency anemia, unspecified: Secondary | ICD-10-CM | POA: Diagnosis not present

## 2020-05-21 DIAGNOSIS — N10 Acute tubulo-interstitial nephritis: Secondary | ICD-10-CM | POA: Diagnosis not present

## 2020-06-05 ENCOUNTER — Encounter (HOSPITAL_COMMUNITY): Payer: Self-pay

## 2020-06-05 ENCOUNTER — Other Ambulatory Visit: Payer: Self-pay

## 2020-06-05 ENCOUNTER — Emergency Department (HOSPITAL_COMMUNITY)
Admission: EM | Admit: 2020-06-05 | Discharge: 2020-06-05 | Disposition: A | Discharge status: Home / Self Care | Payer: Medicare Other | Attending: Emergency Medicine | Admitting: Emergency Medicine

## 2020-06-05 ENCOUNTER — Inpatient Hospital Stay: Payer: Medicare Other

## 2020-06-05 ENCOUNTER — Inpatient Hospital Stay: Payer: Medicare Other | Attending: Physician Assistant | Admitting: Internal Medicine

## 2020-06-05 DIAGNOSIS — J0100 Acute maxillary sinusitis, unspecified: Secondary | ICD-10-CM | POA: Diagnosis not present

## 2020-06-05 DIAGNOSIS — Z79899 Other long term (current) drug therapy: Secondary | ICD-10-CM | POA: Insufficient documentation

## 2020-06-05 DIAGNOSIS — Z955 Presence of coronary angioplasty implant and graft: Secondary | ICD-10-CM | POA: Diagnosis not present

## 2020-06-05 DIAGNOSIS — K0889 Other specified disorders of teeth and supporting structures: Secondary | ICD-10-CM | POA: Diagnosis not present

## 2020-06-05 DIAGNOSIS — Z794 Long term (current) use of insulin: Secondary | ICD-10-CM | POA: Insufficient documentation

## 2020-06-05 DIAGNOSIS — Z7951 Long term (current) use of inhaled steroids: Secondary | ICD-10-CM | POA: Insufficient documentation

## 2020-06-05 DIAGNOSIS — J069 Acute upper respiratory infection, unspecified: Secondary | ICD-10-CM

## 2020-06-05 DIAGNOSIS — Z7984 Long term (current) use of oral hypoglycemic drugs: Secondary | ICD-10-CM | POA: Diagnosis not present

## 2020-06-05 DIAGNOSIS — Z20822 Contact with and (suspected) exposure to covid-19: Secondary | ICD-10-CM | POA: Diagnosis not present

## 2020-06-05 DIAGNOSIS — R519 Headache, unspecified: Secondary | ICD-10-CM | POA: Diagnosis present

## 2020-06-05 DIAGNOSIS — J45909 Unspecified asthma, uncomplicated: Secondary | ICD-10-CM | POA: Diagnosis not present

## 2020-06-05 DIAGNOSIS — E119 Type 2 diabetes mellitus without complications: Secondary | ICD-10-CM | POA: Diagnosis not present

## 2020-06-05 DIAGNOSIS — I10 Essential (primary) hypertension: Secondary | ICD-10-CM | POA: Diagnosis not present

## 2020-06-05 DIAGNOSIS — F17210 Nicotine dependence, cigarettes, uncomplicated: Secondary | ICD-10-CM | POA: Diagnosis not present

## 2020-06-05 LAB — RESPIRATORY PANEL BY RT PCR (FLU A&B, COVID)
Influenza A by PCR: NEGATIVE
Influenza B by PCR: NEGATIVE
SARS Coronavirus 2 by RT PCR: NEGATIVE

## 2020-06-05 LAB — CBG MONITORING, ED: Glucose-Capillary: 141 mg/dL — ABNORMAL HIGH (ref 70–99)

## 2020-06-05 MED ORDER — DOXYCYCLINE HYCLATE 100 MG PO CAPS: 100.0000 mg | ORAL_CAPSULE | Freq: Two times a day (BID) | ORAL | 0 refills | Status: DC

## 2020-06-05 MED ORDER — PREDNISONE 20 MG PO TABS: 40.0000 mg | ORAL_TABLET | Freq: Every day | ORAL | 0 refills | Status: DC

## 2020-06-05 NOTE — ED Triage Notes (Signed)
Pt c/o sinus pain, and headache x3 days. States hx of "bad sinus infections". C/o worsening dental pain, does not have a dentist at this time. Denies any sick contacts.

## 2020-06-05 NOTE — Discharge Instructions (Addendum)
You are being treated with doxycycline for sinus infection dental infection and lung infection.  Take the steroids also to help with the asthma.  Return for worsening shortness of breath.  Follow-up with your doctor and dentist as needed

## 2020-06-05 NOTE — ED Notes (Signed)
CBG taken at 8:40am - resulted 141

## 2020-06-05 NOTE — ED Provider Notes (Addendum)
[facility] DEPT Provider Note   CSN: [id number] Arrival date & time: 06/05/20  8416     History Chief Complaint  Patient presents with  . Facial Pain  . Headache  . Dental Pain    Gina [name] is a 54 y.o. female.  HPI Patient is had 3 to 4-day history of sinus pressure facial pain headache dental pain cough and sputum production.  History of asthma.  States that history of sinus infections also.  States the headache is dull.  It is diffuse over the head but more in the front of the head.  Congestion and pressure started in the right cheek area and now is on both sides.  No vision changes.  No pain with moving her eyes.  Has been wheezing.  States that she has been using her inhaler without relief.  States she is also coughing up yellow sputum that seems to come from her lungs not just on her nose.    Past Medical History:  Diagnosis Date  . Anemia   . Anxiety   . Arthritis   . Asthma   . Back pain   . Chronic pain   . Diabetes mellitus    type 2  . Dyspnea    with exersion   . Family history of adverse reaction to anesthesia    youngest daughter hard to wake up both daughters post op n/v  . Fibromyalgia   . GERD (gastroesophageal reflux disease)   . Headache    migraines  . History of kidney stones    2 big stones in left kidney now  . History of recurrent UTIs    last uti 3 weeks ago  . Hyperlipidemia   . Hypertension   . IBS (irritable bowel syndrome)   . Obesities, morbid ([place])   . Pneumonia 06/2016  . PONV (postoperative nausea and vomiting) 2004   came back after hystectomy bowels locked up on life support for 1 month   . Sinusitis   . 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 Umbilical hernia   . Wound discharge    open wound right leg going to wound center changes dressing qday with santyl cream quarter size wound    Patient Active Problem List   Diagnosis Date Noted  . Acute respiratory failure  with hypoxia and hypercarbia ([place]) 03/23/2020  . Aspiration into airway   . Hyperkalemia 03/19/2020  . Severe sepsis with septic shock ([place]) 03/19/2020  . Acute pyelonephritis 03/19/2020  . Tobacco abuse 03/19/2020  . Wheezing 03/19/2020  . Sepsis with acute renal failure ([place]) 03/18/2020  . Ureteral stone with hydronephrosis   . Anemia 12/18/2019  . Iron deficiency anemia 12/18/2019  . Peripheral neuropathy 10/25/2017  . Chronic pain disorder 06/25/2017  . Intertrigo 06/25/2017  . Chronic back pain 03/19/2017  . GERD (gastroesophageal reflux disease) 03/19/2017  . Morbid obesity with BMI of 50.0-59.9, adult ([place]) 03/19/2017  . Bacteremia due to group B Streptococcus 02/11/2017  . Volume overload 02/11/2017  . Acute encephalopathy 02/11/2017  . Right renal mass 02/11/2017  . Normocytic anemia 02/11/2017  . Generalized abdominal pain   . Constipation   . Benign neoplasm of rectum   . Nausea and vomiting   . Shortness of breath 06/25/2016  . CAP (community acquired pneumonia) 06/25/2016  . History of recurrent UTIs 06/25/2016  . Hyperlipidemia 06/25/2016  . Type 2 diabetes mellitus with diabetic neuropathy, unspecified ([place])  06/25/2016  . Hypertension 06/25/2016  . Asthma 06/25/2016  . Polypharmacy 06/25/2016  . Eosinophilia 04/23/2014    Past Surgical History:  Procedure Laterality Date  . ABDOMINAL HYSTERECTOMY     1 ovary left  . CARPAL TUNNEL RELEASE Right   . CESAREAN SECTION     x 2  . CHOLECYSTECTOMY    . COLONOSCOPY WITH PROPOFOL N/A 12/03/2016   Procedure: COLONOSCOPY WITH PROPOFOL;  Surgeon: Doran Stabler, MD;  Location: [facility];  Service: Gastroenterology;  Laterality: N/A;  . CYSTECTOMY     left foot  . CYSTOSCOPY/URETEROSCOPY/HOLMIUM LASER/STENT PLACEMENT Left 03/18/2020   Procedure: CYSTOSCOPY/LEFT RETROGRADE PYELOGRAM, STENT PLACEMENT;  Surgeon: Ceasar Mons, MD;  Location: [facility];  Service: Urology;  Laterality: Left;  .  ESOPHAGOGASTRODUODENOSCOPY (EGD) WITH PROPOFOL N/A 12/03/2016   Procedure: ESOPHAGOGASTRODUODENOSCOPY (EGD) WITH PROPOFOL;  Surgeon: Doran Stabler, MD;  Location: [facility];  Service: Gastroenterology;  Laterality: N/A;  . LITHOTRIPSY    . MULTIPLE TOOTH EXTRACTIONS    . URETEROSCOPY WITH HOLMIUM LASER LITHOTRIPSY Left 04/09/2020   Procedure: URETEROSCOPY WITH HOLMIUM LASER LITHOTRIPSY/ STENT CHANGE;  Surgeon: Festus Aloe, MD;  Location: [facility];  Service: Urology;  Laterality: Left;     OB History   No obstetric history on file.     Family History  Problem Relation Age of Onset  . Cancer Mother   . Cirrhosis Mother   . Heart failure Father   . Diabetes Father   . Hypertension Father   . Colon cancer Neg Hx     Social History   Tobacco Use  . Smoking status: Current Every Day Smoker    Packs/day: 1.00    Years: 33.00    Pack years: 33.00    Types: Cigarettes  . Smokeless tobacco: Never Used  Vaping Use  . Vaping Use: Never used  Substance Use Topics  . Alcohol use: No    Comment: Social  . Drug use: No    Home Medications Prior to Admission medications   Medication Sig Start Date End Date Taking? Authorizing Provider  acetaminophen (TYLENOL) 500 MG tablet Take 1,000 mg by mouth every 6 (six) hours as needed for mild pain.    [provider]  ALPRAZolam Duanne Moron) 1 MG tablet Take 1 mg by mouth 2 (two) times daily as needed for anxiety.     [provider]  atorvastatin (LIPITOR) 20 MG tablet TAKE 1 TABLET (20 MG TOTAL) DAILY BY MOUTH. 03/18/18   [place], Betty G, MD  atorvastatin (LIPITOR) 40 MG tablet Take 40 mg by mouth daily. 01/09/20   [provider]  doxycycline (VIBRAMYCIN) 100 MG capsule Take 1 capsule (100 mg total) by mouth 2 (two) times daily. 06/05/20   Davonna Belling, MD  fluticasone (FLONASE) 50 MCG/ACT nasal spray Place 1 spray into both nostrils daily.  06/18/16   [provider]  Fluticasone-Salmeterol (WIXELA  INHUB) 250-50 MCG/DOSE AEPB Inhale 1 puff into the lungs 2 (two) times daily. 12/22/18   [place], Betty G, MD  glucose blood (ONE TOUCH ULTRA TEST) test strip USE TO TEST BLOOD SUGAR 3 TIMES DAILY 06/16/18   [place], Betty G, MD  ibuprofen (ADVIL) 800 MG tablet Take 1 tablet (800 mg total) by mouth 3 (three) times daily. 03/26/20   Nita Sells, MD  insulin aspart (NOVOLOG) 100 UNIT/ML injection Inject 0-20 Units into the skin 3 (three) times daily with meals. Patient not taking: Reported on 03/28/2020 03/26/20   Nita Sells, MD  insulin aspart (NOVOLOG) 100 UNIT/ML injection Inject 0-5 Units into the skin at bedtime. Patient not taking: Reported on 03/28/2020 03/26/20   Nita Sells, MD  lactulose (CHRONULAC) 10 GM/15ML solution Take 30 mLs (20 g total) by mouth 2 (two) times daily. Patient not taking: Reported on 03/28/2020 03/26/20   Nita Sells, MD  lisinopril (ZESTRIL) 10 MG tablet Take 10 mg by mouth daily. Patient not taking: Reported on 03/28/2020 10/12/19   [provider]  metFORMIN (GLUCOPHAGE) 850 MG tablet TAKE 1 TABLET (850 MG TOTAL) BY MOUTH 2 (TWO) TIMES DAILY WITH A MEAL. Patient not taking: Reported on 03/28/2020 03/24/18   [place], Betty G, MD  nitrofurantoin (MACRODANTIN) 50 MG capsule Take 50 mg by mouth at bedtime. 03/20/20   [provider]  nystatin (NYSTATIN) powder Apply topically 3 (three) times daily as needed. Patient taking differently: Apply 1 application topically 3 (three) times daily as needed (chaffing).  01/17/18   [place], Betty G, MD  omeprazole (PRILOSEC) 20 MG capsule TAKE 1 CAPSULE (20 MG TOTAL) BY MOUTH DAILY BEFORE BREAKFAST. 07/03/19   [place], Betty G, MD  opium-belladonna (B&O SUPPRETTES) 16.2-60 MG suppository Place 1 suppository rectally every 6 (six) hours. Patient not taking: Reported on 03/28/2020 03/26/20   Nita Sells, MD  oxybutynin (DITROPAN) 5 MG tablet Take 2 tablets (10 mg total) by mouth 3 (three)  times daily. 03/26/20   Nita Sells, MD  oxyCODONE (OXY IR/ROXICODONE) 5 MG immediate release tablet Take 1 tablet (5 mg total) by mouth every 8 (eight) hours as needed for severe pain. 03/26/20   Nita Sells, MD  OZEMPIC, 0.25 OR 0.5 MG/DOSE, 2 MG/1.5ML SOPN Inject 2 mg into the skin once a week. 03/05/20   [provider]  polyvinyl alcohol (LIQUIFILM TEARS) 1.4 % ophthalmic solution Place 1 drop into both eyes as needed for dry eyes.    [provider]  potassium chloride SA (KLOR-CON) 20 MEQ tablet Take 2 tablets (40 mEq total) by mouth 2 (two) times daily. 03/26/20   Nita Sells, MD  predniSONE (DELTASONE) 20 MG tablet Take 2 tablets (40 mg total) by mouth daily. 06/05/20   Davonna Belling, MD  pregabalin (LYRICA) 200 MG capsule TAKE 1 CAPSULE BY MOUTH THREE TIMES A DAY Patient taking differently: Take 200 mg by mouth in the morning, at noon, and at bedtime.  01/17/18   [place], Betty G, MD  tamsulosin (FLOMAX) 0.4 MG CAPS capsule Take 1 capsule (0.4 mg total) by mouth daily. 03/27/20   Nita Sells, MD  VENTOLIN HFA 108 (90 Base) MCG/ACT inhaler INHALE 2 PUFFS EVERY 6 (SIX) HOURS AS NEEDED INTO THE LUNGS FOR WHEEZING OR SHORTNESS OF BREATH. Patient taking differently: Inhale 2 puffs into the lungs every 6 (six) hours as needed for wheezing or shortness of breath.  06/02/18   [place], Betty G, MD    Allergies    Darvocet [propoxyphene n-acetaminophen], Fish allergy, Morphine and related, Penicillins, Tape, Fentanyl, and Other  Review of Systems   Review of Systems  Constitutional: Positive for fatigue. Negative for appetite change and fever.  HENT: Positive for congestion, dental problem, facial swelling, sinus pressure and sinus pain. Negative for sore throat, trouble swallowing and voice change.   Respiratory: Positive for cough and wheezing.   Cardiovascular: Negative for chest pain.  Gastrointestinal: Negative for abdominal pain.   Genitourinary: Negative for flank pain.  Musculoskeletal: Negative for back pain.  Skin: Negative for rash.  Neurological: Positive for headaches.  Psychiatric/Behavioral: Negative for confusion.  Physical Exam Updated Vital Signs BP (!) 169/66 (BP Location: Left Arm)   Pulse 70   Temp 97.9 F (36.6 C) (Oral)   Resp 18   SpO2 94%   Physical Exam Vitals and nursing note reviewed.  HENT:     Head: Atraumatic.     Comments: Some tenderness over bilateral maxillary sinuses.  No facial swelling.  Eye movements intact.  Patient's left central incisor on the upper jaw broken off at the gum. Eyes:     Pupils: Pupils are equal, round, and reactive to light.  Cardiovascular:     Rate and Rhythm: Normal rate.  Pulmonary:     Breath sounds: Wheezing present.     Comments: Wheezes but no focal rales or rhonchi. Abdominal:     Tenderness: There is no abdominal tenderness.  Skin:    General: Skin is warm.     Capillary Refill: Capillary refill takes less than 2 seconds.  Neurological:     Mental Status: She is alert.  Psychiatric:        Mood and Affect: Mood normal.     ED Results / Procedures / Treatments   Labs (all labs ordered are listed, but only abnormal results are displayed) Labs Reviewed  CBG MONITORING, ED - Abnormal; Notable for the following components:      Result Value   Glucose-Capillary 141 (*)    All other components within normal limits  RESPIRATORY PANEL BY RT PCR (FLU A&B, COVID)    EKG None  Radiology No results found.  Procedures Procedures (including critical care time)  Medications Ordered in ED Medications - No data to display  ED Course  I have reviewed the triage vital signs and the nursing notes.  Pertinent labs & imaging results that were available during my care of the patient were reviewed by me and considered in my medical decision making (see chart for details).    MDM Rules/Calculators/A&P                          Patient  with likely sinusitis also dental pain and URI symptoms.  With asthma in the wheezing will treat with both steroids and antibiotics.  Does not appear to be in any respiratory distress.  Is diabetic will need to follow her sugars more closely.  Has penicillin allergy but will do doxycycline.  Outpatient follow-up as needed.  Does not appear to need imaging at this time. No covid vaccine. Covid test done and can follow as outpatient. Final Clinical Impression(s) / ED Diagnoses Final diagnoses:  Pain, dental  Acute maxillary sinusitis, recurrence not specified  Upper respiratory tract infection, unspecified type  Asthma, unspecified asthma severity, unspecified whether complicated, unspecified whether persistent    Rx / DC Orders ED Discharge Orders         Ordered    doxycycline (VIBRAMYCIN) 100 MG capsule  2 times daily        06/05/20 0859    predniSONE (DELTASONE) 20 MG tablet  Daily        06/05/20 0859           Davonna Belling, MD 06/05/20 [id number]    Davonna Belling, MD 06/05/20 [phone]

## 2020-06-14 DIAGNOSIS — R110 Nausea: Secondary | ICD-10-CM | POA: Diagnosis not present

## 2020-06-14 DIAGNOSIS — J329 Chronic sinusitis, unspecified: Secondary | ICD-10-CM | POA: Diagnosis not present

## 2020-07-15 DIAGNOSIS — J4520 Mild intermittent asthma, uncomplicated: Secondary | ICD-10-CM | POA: Diagnosis not present

## 2020-07-15 DIAGNOSIS — E1169 Type 2 diabetes mellitus with other specified complication: Secondary | ICD-10-CM | POA: Diagnosis not present

## 2020-07-23 DIAGNOSIS — J4521 Mild intermittent asthma with (acute) exacerbation: Secondary | ICD-10-CM | POA: Diagnosis not present

## 2020-07-23 DIAGNOSIS — I10 Essential (primary) hypertension: Secondary | ICD-10-CM | POA: Diagnosis not present

## 2020-07-23 DIAGNOSIS — J40 Bronchitis, not specified as acute or chronic: Secondary | ICD-10-CM | POA: Diagnosis not present

## 2020-07-23 DIAGNOSIS — J329 Chronic sinusitis, unspecified: Secondary | ICD-10-CM | POA: Diagnosis not present

## 2020-08-02 DIAGNOSIS — M47816 Spondylosis without myelopathy or radiculopathy, lumbar region: Secondary | ICD-10-CM | POA: Diagnosis not present

## 2020-08-02 DIAGNOSIS — R109 Unspecified abdominal pain: Secondary | ICD-10-CM | POA: Diagnosis not present

## 2020-08-02 DIAGNOSIS — F17210 Nicotine dependence, cigarettes, uncomplicated: Secondary | ICD-10-CM | POA: Diagnosis not present

## 2020-08-02 DIAGNOSIS — K439 Ventral hernia without obstruction or gangrene: Secondary | ICD-10-CM | POA: Diagnosis not present

## 2020-08-02 DIAGNOSIS — B3749 Other urogenital candidiasis: Secondary | ICD-10-CM | POA: Diagnosis not present

## 2020-08-02 DIAGNOSIS — I700 Atherosclerosis of aorta: Secondary | ICD-10-CM | POA: Diagnosis not present

## 2020-08-02 DIAGNOSIS — N390 Urinary tract infection, site not specified: Secondary | ICD-10-CM | POA: Diagnosis not present

## 2020-08-02 DIAGNOSIS — N200 Calculus of kidney: Secondary | ICD-10-CM | POA: Diagnosis not present

## 2020-08-02 DIAGNOSIS — K047 Periapical abscess without sinus: Secondary | ICD-10-CM | POA: Diagnosis not present

## 2020-08-02 DIAGNOSIS — B9689 Other specified bacterial agents as the cause of diseases classified elsewhere: Secondary | ICD-10-CM | POA: Diagnosis not present

## 2020-08-02 DIAGNOSIS — M545 Low back pain, unspecified: Secondary | ICD-10-CM | POA: Diagnosis not present

## 2020-08-22 DIAGNOSIS — M47816 Spondylosis without myelopathy or radiculopathy, lumbar region: Secondary | ICD-10-CM | POA: Diagnosis not present

## 2020-08-22 DIAGNOSIS — N200 Calculus of kidney: Secondary | ICD-10-CM | POA: Diagnosis not present

## 2020-08-22 DIAGNOSIS — Q7959 Other congenital malformations of abdominal wall: Secondary | ICD-10-CM | POA: Diagnosis not present

## 2020-08-22 DIAGNOSIS — N10 Acute tubulo-interstitial nephritis: Secondary | ICD-10-CM | POA: Diagnosis not present

## 2020-08-22 DIAGNOSIS — R1084 Generalized abdominal pain: Secondary | ICD-10-CM | POA: Diagnosis not present

## 2020-08-22 DIAGNOSIS — I700 Atherosclerosis of aorta: Secondary | ICD-10-CM | POA: Diagnosis not present

## 2020-08-22 DIAGNOSIS — R8279 Other abnormal findings on microbiological examination of urine: Secondary | ICD-10-CM | POA: Diagnosis not present

## 2020-08-23 DIAGNOSIS — J4521 Mild intermittent asthma with (acute) exacerbation: Secondary | ICD-10-CM | POA: Diagnosis not present

## 2020-09-23 DIAGNOSIS — J4521 Mild intermittent asthma with (acute) exacerbation: Secondary | ICD-10-CM | POA: Diagnosis not present

## 2020-10-17 DIAGNOSIS — J40 Bronchitis, not specified as acute or chronic: Secondary | ICD-10-CM | POA: Diagnosis not present

## 2020-10-17 DIAGNOSIS — E1169 Type 2 diabetes mellitus with other specified complication: Secondary | ICD-10-CM | POA: Diagnosis not present

## 2020-10-17 DIAGNOSIS — J329 Chronic sinusitis, unspecified: Secondary | ICD-10-CM | POA: Diagnosis not present

## 2020-10-17 DIAGNOSIS — J4521 Mild intermittent asthma with (acute) exacerbation: Secondary | ICD-10-CM | POA: Diagnosis not present

## 2020-10-21 DIAGNOSIS — J4521 Mild intermittent asthma with (acute) exacerbation: Secondary | ICD-10-CM | POA: Diagnosis not present

## 2020-10-29 DIAGNOSIS — J309 Allergic rhinitis, unspecified: Secondary | ICD-10-CM | POA: Diagnosis not present

## 2020-10-29 DIAGNOSIS — K219 Gastro-esophageal reflux disease without esophagitis: Secondary | ICD-10-CM | POA: Diagnosis not present

## 2020-10-29 DIAGNOSIS — R490 Dysphonia: Secondary | ICD-10-CM | POA: Diagnosis not present

## 2020-10-29 DIAGNOSIS — J4520 Mild intermittent asthma, uncomplicated: Secondary | ICD-10-CM | POA: Diagnosis not present

## 2020-10-29 DIAGNOSIS — J329 Chronic sinusitis, unspecified: Secondary | ICD-10-CM | POA: Diagnosis not present

## 2020-10-29 DIAGNOSIS — R0982 Postnasal drip: Secondary | ICD-10-CM | POA: Diagnosis not present

## 2020-11-06 DIAGNOSIS — I700 Atherosclerosis of aorta: Secondary | ICD-10-CM | POA: Diagnosis not present

## 2020-11-06 DIAGNOSIS — N10 Acute tubulo-interstitial nephritis: Secondary | ICD-10-CM | POA: Diagnosis not present

## 2020-11-06 DIAGNOSIS — R8279 Other abnormal findings on microbiological examination of urine: Secondary | ICD-10-CM | POA: Diagnosis not present

## 2020-11-06 DIAGNOSIS — E785 Hyperlipidemia, unspecified: Secondary | ICD-10-CM | POA: Diagnosis not present

## 2020-11-06 DIAGNOSIS — N3020 Other chronic cystitis without hematuria: Secondary | ICD-10-CM | POA: Diagnosis not present

## 2020-11-06 DIAGNOSIS — K439 Ventral hernia without obstruction or gangrene: Secondary | ICD-10-CM | POA: Diagnosis not present

## 2020-11-06 DIAGNOSIS — Z9049 Acquired absence of other specified parts of digestive tract: Secondary | ICD-10-CM | POA: Diagnosis not present

## 2020-11-06 DIAGNOSIS — I10 Essential (primary) hypertension: Secondary | ICD-10-CM | POA: Diagnosis not present

## 2020-11-06 DIAGNOSIS — N200 Calculus of kidney: Secondary | ICD-10-CM | POA: Diagnosis not present

## 2020-11-10 ENCOUNTER — Emergency Department (HOSPITAL_COMMUNITY)
Admission: EM | Admit: 2020-11-10 | Discharge: 2020-11-10 | Disposition: A | Discharge status: Home / Self Care | Payer: Medicare Other | Attending: Emergency Medicine | Admitting: Emergency Medicine

## 2020-11-10 ENCOUNTER — Emergency Department (HOSPITAL_COMMUNITY): Payer: Medicare Other

## 2020-11-10 ENCOUNTER — Other Ambulatory Visit: Payer: Self-pay

## 2020-11-10 ENCOUNTER — Encounter (HOSPITAL_COMMUNITY): Payer: Self-pay | Admitting: Emergency Medicine

## 2020-11-10 DIAGNOSIS — N3000 Acute cystitis without hematuria: Secondary | ICD-10-CM | POA: Insufficient documentation

## 2020-11-10 DIAGNOSIS — F17210 Nicotine dependence, cigarettes, uncomplicated: Secondary | ICD-10-CM | POA: Diagnosis not present

## 2020-11-10 DIAGNOSIS — K429 Umbilical hernia without obstruction or gangrene: Secondary | ICD-10-CM | POA: Diagnosis not present

## 2020-11-10 DIAGNOSIS — I10 Essential (primary) hypertension: Secondary | ICD-10-CM | POA: Insufficient documentation

## 2020-11-10 DIAGNOSIS — Z79899 Other long term (current) drug therapy: Secondary | ICD-10-CM | POA: Insufficient documentation

## 2020-11-10 DIAGNOSIS — E1140 Type 2 diabetes mellitus with diabetic neuropathy, unspecified: Secondary | ICD-10-CM | POA: Diagnosis not present

## 2020-11-10 DIAGNOSIS — Z794 Long term (current) use of insulin: Secondary | ICD-10-CM | POA: Insufficient documentation

## 2020-11-10 DIAGNOSIS — J45909 Unspecified asthma, uncomplicated: Secondary | ICD-10-CM | POA: Diagnosis not present

## 2020-11-10 DIAGNOSIS — M47816 Spondylosis without myelopathy or radiculopathy, lumbar region: Secondary | ICD-10-CM | POA: Diagnosis not present

## 2020-11-10 DIAGNOSIS — N200 Calculus of kidney: Secondary | ICD-10-CM | POA: Diagnosis not present

## 2020-11-10 DIAGNOSIS — L989 Disorder of the skin and subcutaneous tissue, unspecified: Secondary | ICD-10-CM | POA: Diagnosis not present

## 2020-11-10 DIAGNOSIS — R059 Cough, unspecified: Secondary | ICD-10-CM | POA: Insufficient documentation

## 2020-11-10 DIAGNOSIS — R079 Chest pain, unspecified: Secondary | ICD-10-CM

## 2020-11-10 DIAGNOSIS — Z7984 Long term (current) use of oral hypoglycemic drugs: Secondary | ICD-10-CM | POA: Insufficient documentation

## 2020-11-10 LAB — BASIC METABOLIC PANEL
Anion gap: 9 (ref 5–15)
BUN: 15 mg/dL (ref 6–20)
CO2: 22 mmol/L (ref 22–32)
Calcium: 9.6 mg/dL (ref 8.9–10.3)
Chloride: 106 mmol/L (ref 98–111)
Creatinine, Ser: 0.85 mg/dL (ref 0.44–1.00)
GFR, Estimated: >60 mL/min (ref >60)
Glucose, Bld: 214 mg/dL — ABNORMAL HIGH (ref 70–99)
Potassium: 4.5 mmol/L (ref 3.5–5.1)
Sodium: 137 mmol/L (ref 135–145)

## 2020-11-10 LAB — URINALYSIS, ROUTINE W REFLEX MICROSCOPIC
Bilirubin Urine: NEGATIVE
Glucose, UA: NEGATIVE mg/dL
Ketones, ur: NEGATIVE mg/dL
Nitrite: NEGATIVE
Protein, ur: 30 mg/dL — AB
Specific Gravity, Urine: 1.011 (ref 1.005–1.030)
WBC, UA: >50 WBC/hpf — ABNORMAL HIGH (ref 0–5)
pH: 5 (ref 5.0–8.0)

## 2020-11-10 LAB — CBC
HCT: 41 % (ref 36.0–46.0)
Hemoglobin: 13 g/dL (ref 12.0–15.0)
MCH: 27.8 pg (ref 26.0–34.0)
MCHC: 31.7 g/dL (ref 30.0–36.0)
MCV: 87.6 fL (ref 80.0–100.0)
Platelets: 178 10*3/uL (ref 150–400)
RBC: 4.68 MIL/uL (ref 3.87–5.11)
RDW: 16.2 % — ABNORMAL HIGH (ref 11.5–15.5)
WBC: 3.4 10*3/uL — ABNORMAL LOW (ref 4.0–10.5)
nRBC: 0 % (ref 0.0–0.2)

## 2020-11-10 LAB — HEPATIC FUNCTION PANEL
ALT: 27 U/L (ref 0–44)
AST: 27 U/L (ref 15–41)
Albumin: 3.7 g/dL (ref 3.5–5.0)
Alkaline Phosphatase: 79 U/L (ref 38–126)
Bilirubin, Direct: 0.1 mg/dL (ref 0.0–0.2)
Indirect Bilirubin: 0.4 mg/dL (ref 0.3–0.9)
Total Bilirubin: 0.5 mg/dL (ref 0.3–1.2)
Total Protein: 7 g/dL (ref 6.5–8.1)

## 2020-11-10 LAB — I-STAT BETA HCG BLOOD, ED (MC, WL, AP ONLY): I-stat hCG, quantitative: <5 m[IU]/mL (ref <5)

## 2020-11-10 LAB — LIPASE, BLOOD: Lipase: 41 U/L (ref 11–51)

## 2020-11-10 LAB — TROPONIN I (HIGH SENSITIVITY)
Troponin I (High Sensitivity): 21 ng/L — ABNORMAL HIGH (ref <18)
Troponin I (High Sensitivity): 20 ng/L — ABNORMAL HIGH (ref <18)

## 2020-11-10 LAB — D-DIMER, QUANTITATIVE: D-Dimer, Quant: 0.45 ug/mL-FEU (ref 0.00–0.50)

## 2020-11-10 MED ORDER — ALBUTEROL SULFATE (2.5 MG/3ML) 0.083% IN NEBU
5.0000 mg | INHALATION_SOLUTION | Freq: Once | RESPIRATORY_TRACT | Status: AC
  Administered 2020-11-10: 5 mg via RESPIRATORY_TRACT
  Filled 2020-11-10: qty 6

## 2020-11-10 MED ORDER — FENTANYL CITRATE (PF) 100 MCG/2ML IJ SOLN
50.0000 ug | Freq: Once | INTRAMUSCULAR | Status: AC
  Administered 2020-11-10: 50 ug via INTRAVENOUS
  Filled 2020-11-10: qty 2

## 2020-11-10 MED ORDER — OXYCODONE HCL 5 MG PO TABS
5.0000 mg | ORAL_TABLET | Freq: Once | ORAL | Status: AC
  Administered 2020-11-10: 5 mg via ORAL
  Filled 2020-11-10: qty 1

## 2020-11-10 MED ORDER — ONDANSETRON HCL 4 MG/2ML IJ SOLN
4.0000 mg | Freq: Once | INTRAMUSCULAR | Status: AC
  Administered 2020-11-10: 4 mg via INTRAVENOUS
  Filled 2020-11-10: qty 2

## 2020-11-10 MED ORDER — IPRATROPIUM BROMIDE 0.02 % IN SOLN
0.5000 mg | Freq: Once | RESPIRATORY_TRACT | Status: AC
  Administered 2020-11-10: 0.5 mg via RESPIRATORY_TRACT
  Filled 2020-11-10: qty 2.5

## 2020-11-10 MED ORDER — HYDROCOD POLST-CPM POLST ER 10-8 MG/5ML PO SUER: 5.0000 mL | Freq: Two times a day (BID) | ORAL | 0 refills | Status: DC | PRN

## 2020-11-10 NOTE — ED Provider Notes (Signed)
[facility] DEPT Provider Note   CSN: [id number] Arrival date & time: 11/10/20  1136     History Chief Complaint  Patient presents with  . Chest Pain    Gina [name] is a 55 y.o. female.  Patient with history of diabetes, asthma, kidney stones --presents today for multiple complaints.  She mainly presents for chest pain that is in the mid chest with radiation to left shoulder and left arm that is worse with coughing.  This has been ongoing for the past approximately 5 days.  She states that she was diagnosed with sinusitis and bronchitis about 3 weeks ago.  She was placed on a course of antibiotics and a course of steroids which she has completed.  She continues to have the symptoms.  They are improved by home albuterol inhaler and nebulizer temporarily.  Patient states that after the onset of the symptoms she developed pain in her back as well as dysuria.  She states that she saw a urologist and was started on a course of antibiotics for "kidney infection" that she has been taking for about 5 days.  She is currently also on Zyrtec for her symptoms.  She has had a fever to 101 F, last fever was yesterday.  She reports nausea and vomiting, especially after coughing. The onset of this condition was acute. The course is constant.         Past Medical History:  Diagnosis Date  . Anemia   . Anxiety   . Arthritis   . Asthma   . Back pain   . Chronic pain   . Diabetes mellitus    type 2  . Dyspnea    with exersion   . Family history of adverse reaction to anesthesia    youngest daughter hard to wake up both daughters post op n/v  . Fibromyalgia   . GERD (gastroesophageal reflux disease)   . Headache    migraines  . History of kidney stones    2 big stones in left kidney now  . History of recurrent UTIs    last uti 3 weeks ago  . Hyperlipidemia   . Hypertension   . IBS (irritable bowel syndrome)   . Obesities, morbid ([place])   . Pneumonia  06/2016  . PONV (postoperative nausea and vomiting) 2004   came back after hystectomy bowels locked up on life support for 1 month   . Sinusitis   . 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 Umbilical hernia   . Wound discharge    open wound right leg going to wound center changes dressing qday with santyl cream quarter size wound    Patient Active Problem List   Diagnosis Date Noted  . Acute respiratory failure with hypoxia and hypercarbia ([place]) 03/23/2020  . Aspiration into airway   . Hyperkalemia 03/19/2020  . Severe sepsis with septic shock ([place]) 03/19/2020  . Acute pyelonephritis 03/19/2020  . Tobacco abuse 03/19/2020  . Wheezing 03/19/2020  . Sepsis with acute renal failure ([place]) 03/18/2020  . Ureteral stone with hydronephrosis   . Anemia 12/18/2019  . Iron deficiency anemia 12/18/2019  . Peripheral neuropathy 10/25/2017  . Chronic pain disorder 06/25/2017  . Intertrigo 06/25/2017  . Chronic back pain 03/19/2017  . GERD (gastroesophageal reflux disease) 03/19/2017  . Morbid obesity with BMI of 50.0-59.9, adult ([place]) 03/19/2017  . Bacteremia due to group B Streptococcus 02/11/2017  . Volume overload 02/11/2017  .  Acute encephalopathy 02/11/2017  . Right renal mass 02/11/2017  . Normocytic anemia 02/11/2017  . Generalized abdominal pain   . Constipation   . Benign neoplasm of rectum   . Nausea and vomiting   . Shortness of breath 06/25/2016  . CAP (community acquired pneumonia) 06/25/2016  . History of recurrent UTIs 06/25/2016  . Hyperlipidemia 06/25/2016  . Type 2 diabetes mellitus with diabetic neuropathy, unspecified ([place]) 06/25/2016  . Hypertension 06/25/2016  . Asthma 06/25/2016  . Polypharmacy 06/25/2016  . Eosinophilia 04/23/2014    Past Surgical History:  Procedure Laterality Date  . ABDOMINAL HYSTERECTOMY     1 ovary left  . CARPAL TUNNEL RELEASE Right   . CESAREAN SECTION     x 2  . CHOLECYSTECTOMY    . COLONOSCOPY WITH PROPOFOL  N/A 12/03/2016   Procedure: COLONOSCOPY WITH PROPOFOL;  Surgeon: Doran Stabler, MD;  Location: [facility];  Service: Gastroenterology;  Laterality: N/A;  . CYSTECTOMY     left foot  . CYSTOSCOPY/URETEROSCOPY/HOLMIUM LASER/STENT PLACEMENT Left 03/18/2020   Procedure: CYSTOSCOPY/LEFT RETROGRADE PYELOGRAM, STENT PLACEMENT;  Surgeon: Ceasar Mons, MD;  Location: [facility];  Service: Urology;  Laterality: Left;  . ESOPHAGOGASTRODUODENOSCOPY (EGD) WITH PROPOFOL N/A 12/03/2016   Procedure: ESOPHAGOGASTRODUODENOSCOPY (EGD) WITH PROPOFOL;  Surgeon: Doran Stabler, MD;  Location: [facility];  Service: Gastroenterology;  Laterality: N/A;  . LITHOTRIPSY    . MULTIPLE TOOTH EXTRACTIONS    . URETEROSCOPY WITH HOLMIUM LASER LITHOTRIPSY Left 04/09/2020   Procedure: URETEROSCOPY WITH HOLMIUM LASER LITHOTRIPSY/ STENT CHANGE;  Surgeon: Festus Aloe, MD;  Location: [facility];  Service: Urology;  Laterality: Left;     OB History   No obstetric history on file.     Family History  Problem Relation Age of Onset  . Cancer Mother   . Cirrhosis Mother   . Heart failure Father   . Diabetes Father   . Hypertension Father   . Colon cancer Neg Hx     Social History   Tobacco Use  . Smoking status: Current Every Day Smoker    Packs/day: 1.00    Years: 33.00    Pack years: 33.00    Types: Cigarettes  . Smokeless tobacco: Never Used  Vaping Use  . Vaping Use: Never used  Substance Use Topics  . Alcohol use: No    Comment: Social  . Drug use: No    Home Medications Prior to Admission medications   Medication Sig Start Date End Date Taking? Authorizing Provider  acetaminophen (TYLENOL) 500 MG tablet Take 1,000 mg by mouth every 6 (six) hours as needed for mild pain.    [provider]  ALPRAZolam Duanne Moron) 1 MG tablet Take 1 mg by mouth 2 (two) times daily as needed for anxiety.     [provider]  atorvastatin (LIPITOR) 20 MG tablet TAKE 1 TABLET (20 MG TOTAL)  DAILY BY MOUTH. 03/18/18   [place], Betty G, MD  atorvastatin (LIPITOR) 40 MG tablet Take 40 mg by mouth daily. 01/09/20   [provider]  doxycycline (VIBRAMYCIN) 100 MG capsule Take 1 capsule (100 mg total) by mouth 2 (two) times daily. 06/05/20   Davonna Belling, MD  fluticasone (FLONASE) 50 MCG/ACT nasal spray Place 1 spray into both nostrils daily.  06/18/16   [provider]  Fluticasone-Salmeterol (WIXELA INHUB) 250-50 MCG/DOSE AEPB Inhale 1 puff into the lungs 2 (two) times daily. 12/22/18   [place], Betty G, MD  glucose blood (ONE TOUCH ULTRA TEST)  test strip USE TO TEST BLOOD SUGAR 3 TIMES DAILY 06/16/18   [place], Betty G, MD  ibuprofen (ADVIL) 800 MG tablet Take 1 tablet (800 mg total) by mouth 3 (three) times daily. 03/26/20   Nita Sells, MD  insulin aspart (NOVOLOG) 100 UNIT/ML injection Inject 0-20 Units into the skin 3 (three) times daily with meals. Patient not taking: Reported on 03/28/2020 03/26/20   Nita Sells, MD  insulin aspart (NOVOLOG) 100 UNIT/ML injection Inject 0-5 Units into the skin at bedtime. Patient not taking: Reported on 03/28/2020 03/26/20   Nita Sells, MD  lactulose (CHRONULAC) 10 GM/15ML solution Take 30 mLs (20 g total) by mouth 2 (two) times daily. Patient not taking: Reported on 03/28/2020 03/26/20   Nita Sells, MD  lisinopril (ZESTRIL) 10 MG tablet Take 10 mg by mouth daily. Patient not taking: Reported on 03/28/2020 10/12/19   [provider]  metFORMIN (GLUCOPHAGE) 850 MG tablet TAKE 1 TABLET (850 MG TOTAL) BY MOUTH 2 (TWO) TIMES DAILY WITH A MEAL. Patient not taking: Reported on 03/28/2020 03/24/18   [place], Betty G, MD  nitrofurantoin (MACRODANTIN) 50 MG capsule Take 50 mg by mouth at bedtime. 03/20/20   [provider]  nystatin (NYSTATIN) powder Apply topically 3 (three) times daily as needed. Patient taking differently: Apply 1 application topically 3 (three) times daily as needed (chaffing).   01/17/18   [place], Betty G, MD  omeprazole (PRILOSEC) 20 MG capsule TAKE 1 CAPSULE (20 MG TOTAL) BY MOUTH DAILY BEFORE BREAKFAST. 07/03/19   [place], Betty G, MD  opium-belladonna (B&O SUPPRETTES) 16.2-60 MG suppository Place 1 suppository rectally every 6 (six) hours. Patient not taking: Reported on 03/28/2020 03/26/20   Nita Sells, MD  oxybutynin (DITROPAN) 5 MG tablet Take 2 tablets (10 mg total) by mouth 3 (three) times daily. 03/26/20   Nita Sells, MD  oxyCODONE (OXY IR/ROXICODONE) 5 MG immediate release tablet Take 1 tablet (5 mg total) by mouth every 8 (eight) hours as needed for severe pain. 03/26/20   Nita Sells, MD  OZEMPIC, 0.25 OR 0.5 MG/DOSE, 2 MG/1.5ML SOPN Inject 2 mg into the skin once a week. 03/05/20   [provider]  polyvinyl alcohol (LIQUIFILM TEARS) 1.4 % ophthalmic solution Place 1 drop into both eyes as needed for dry eyes.    [provider]  potassium chloride SA (KLOR-CON) 20 MEQ tablet Take 2 tablets (40 mEq total) by mouth 2 (two) times daily. 03/26/20   Nita Sells, MD  predniSONE (DELTASONE) 20 MG tablet Take 2 tablets (40 mg total) by mouth daily. 06/05/20   Davonna Belling, MD  pregabalin (LYRICA) 200 MG capsule TAKE 1 CAPSULE BY MOUTH THREE TIMES A DAY Patient taking differently: Take 200 mg by mouth in the morning, at noon, and at bedtime.  01/17/18   [place], Betty G, MD  tamsulosin (FLOMAX) 0.4 MG CAPS capsule Take 1 capsule (0.4 mg total) by mouth daily. 03/27/20   Nita Sells, MD  VENTOLIN HFA 108 (90 Base) MCG/ACT inhaler INHALE 2 PUFFS EVERY 6 (SIX) HOURS AS NEEDED INTO THE LUNGS FOR WHEEZING OR SHORTNESS OF BREATH. Patient taking differently: Inhale 2 puffs into the lungs every 6 (six) hours as needed for wheezing or shortness of breath.  06/02/18   [place], Betty G, MD    Allergies    Darvocet [propoxyphene n-acetaminophen], Fish allergy, Morphine and related, Penicillins, Tape, Fentanyl, and  Other  Review of Systems   Review of Systems  Constitutional: Positive for chills and fever.  HENT:  Positive for congestion and sinus pressure. Negative for rhinorrhea and sore throat.   Eyes: Negative for redness.  Respiratory: Positive for cough and shortness of breath.   Cardiovascular: Positive for chest pain. Negative for leg swelling.  Gastrointestinal: Positive for nausea and vomiting. Negative for abdominal pain and diarrhea.  Genitourinary: Positive for dysuria and flank pain. Negative for frequency, hematuria and urgency.  Musculoskeletal: Positive for back pain and myalgias.  Skin: Negative for rash.  Neurological: Negative for headaches.    Physical Exam Updated Vital Signs BP (!) 167/57   Pulse 77   Temp 98.2 F (36.8 C)   Resp (!) 21   SpO2 96%   Physical Exam Vitals and nursing note reviewed.  Constitutional:      Appearance: She is well-developed. She is not diaphoretic.  HENT:     Head: Normocephalic and atraumatic.     Mouth/Throat:     Mouth: Mucous membranes are not dry.  Eyes:     Conjunctiva/sclera: Conjunctivae normal.  Neck:     Vascular: Normal carotid pulses. No carotid bruit or JVD.     Trachea: Trachea normal. No tracheal deviation.  Cardiovascular:     Rate and Rhythm: Normal rate and regular rhythm.     Pulses: No decreased pulses.     Heart sounds: Normal heart sounds, S1 normal and S2 normal. No murmur heard.   Pulmonary:     Effort: Pulmonary effort is normal. No respiratory distress.     Breath sounds: Wheezing (Scattered) present.  Chest:     Chest wall: No tenderness.  Abdominal:     General: Bowel sounds are normal.     Palpations: Abdomen is soft.     Tenderness: There is abdominal tenderness (Generalized, mild). There is no guarding or rebound.  Musculoskeletal:        General: Normal range of motion.     Cervical back: Normal range of motion and neck supple. No muscular tenderness.     Right lower leg: No tenderness.      Left lower leg: No tenderness.  Skin:    General: Skin is warm and dry.     Coloration: Skin is not pale.  Neurological:     Mental Status: She is alert.     ED Results / Procedures / Treatments   Labs (all labs ordered are listed, but only abnormal results are displayed) Labs Reviewed  BASIC METABOLIC PANEL - Abnormal; Notable for the following components:      Result Value   Glucose, Bld 214 (*)    All other components within normal limits  CBC - Abnormal; Notable for the following components:   WBC 3.4 (*)    RDW 16.2 (*)    All other components within normal limits  URINALYSIS, ROUTINE W REFLEX MICROSCOPIC - Abnormal; Notable for the following components:   APPearance CLOUDY (*)    Hgb urine dipstick MODERATE (*)    Protein, ur 30 (*)    Leukocytes,Ua LARGE (*)    WBC, UA >50 (*)    Bacteria, UA RARE (*)    All other components within normal limits  TROPONIN I (HIGH SENSITIVITY) - Abnormal; Notable for the following components:   Troponin I (High Sensitivity) 21 (*)    All other components within normal limits  URINE CULTURE  HEPATIC FUNCTION PANEL  LIPASE, BLOOD  D-DIMER, QUANTITATIVE  I-STAT BETA HCG BLOOD, ED ([facility], [facility], [facility] ONLY)  TROPONIN I (HIGH SENSITIVITY)    EKG EKG Interpretation  Date/Time:  Sunday November 10 2020 11:47:36 EDT Ventricular Rate:  76 PR Interval:  170 QRS Duration: 102 QT Interval:  369 QTC Calculation: 415 R Axis:   157 Text Interpretation: Sinus rhythm Right axis deviation Low voltage, precordial leads No STEMI Confirmed by Octaviano Glow [phone]) on 11/10/2020 11:55:19 AM   Radiology No results found.  Procedures Procedures   Medications Ordered in ED Medications  albuterol (PROVENTIL) (2.5 MG/3ML) 0.083% nebulizer solution 5 mg (has no administration in time range)  ipratropium (ATROVENT) nebulizer solution 0.5 mg (has no administration in time range)  oxyCODONE (Oxy IR/ROXICODONE) immediate release tablet 5 mg (has no  administration in time range)    ED Course  I have reviewed the triage vital signs and the nursing notes.  Pertinent labs & imaging results that were available during my care of the patient were reviewed by me and considered in my medical decision making (see chart for details).  Patient seen and examined. Work-up initiated. Medications ordered. EKG reviewed.   Vital signs reviewed and are as follows: BP (!) 165/73   Pulse 82   Temp 98.2 F (36.8 C)   Resp 17   SpO2 97%   2:12 PM patient reassessed.  She is feeling a bit better after pain medication.  She has signs of UTI.  History of kidney stones, will obtain renal CT to rule out infected stone.  Will need second cardiac marker is first troponin was slightly elevated.  If remainder of work-up is negative, will treat symptomatically.  3:55 PM clinically patient improved.  Troponin flat.  Do not suspect ACS.  CT scan does not show any obstructive kidney stones.  It does show possible pelvic wall inflammation and cellulitis.  I went back in with a nurse chaperone in looked at the pelvic area and there is no signs of significant cellulitis at this point.  Patient with mild tenderness.  No signs of necrotizing fasciitis or other significant infection.   Patient will be treated for bronchitis.  She is on near maximal therapy with steroids, antihistamines, albuterol.  Will give Tussionex as patient has significant pain with coughing.  Patient counseled on use of narcotic pain medications. Counseled not to combine these medications with others containing tylenol. Urged not to drink alcohol, drive, or perform any other activities that requires focus while taking these medications. The patient verbalizes understanding and agrees with the plan.  She also has urinary tract infection.  At this point I do not suspect that he she has pyelonephritis given lack of fever and vomiting.  She will continue course of Cipro.  Urine culture sent today.  For  her problems, she is following up with her doctor in 2 days.  I encouraged that she keep this appointment.  Encouraged return to the emergency department with uncontrolled symptoms including uncontrolled pain, vomiting, fevers, new symptoms or other concerns.  She verbalizes understanding agrees with plan.     MDM Rules/Calculators/A&P                          Chest pain: Likely pleuritic in nature.  D-dimer normal.  Troponin slightly elevated but flat.  EKG without ischemic changes today.  Symptoms controlled in the ED.  Chest x-ray without pneumonia.  Treatment plan as above.  Doubt ACS, dissection, PE, pneumothorax.  UTI: Doubt pyelonephritis.  Culture sent and pending.  Patient will continue Cipro.  CT of the abdomen and pelvis does not show any obstructive  stones.  No signs of pelvic wall cellulitis on chaperoned exam.  No dangerous or life-threatening conditions suspected or identified by history, physical exam, and by work-up. No indications for hospitalization identified.     Final Clinical Impression(s) / ED Diagnoses Final diagnoses:  Cough  Chest pain, unspecified type  Acute cystitis without hematuria    Rx / DC Orders ED Discharge Orders         Ordered    chlorpheniramine-HYDROcodone (TUSSIONEX PENNKINETIC ER) 10-8 MG/5ML SUER  Every 12 hours PRN        11/10/20 1553           Carlisle Cater, PA-C 11/10/20 1600    Wyvonnia Dusky, MD 11/10/20 [phone]

## 2020-11-10 NOTE — ED Triage Notes (Signed)
Patient reports sinusitis and bronchitis diagnosed at PCP x2 weeks ago. Reports taking cipro for kidney infection. Today, c/o constant left chest pain radiating down left arm x4 days. Reports taking zyrtec for sinusitis.

## 2020-11-10 NOTE — ED Notes (Signed)
Patient transported to X-ray 

## 2020-11-10 NOTE — ED Notes (Signed)
Pt ambulated to bathroom with one stand-by assist.

## 2020-11-10 NOTE — Discharge Instructions (Signed)
Please read and follow all provided instructions.  Your diagnoses today include:  1. Cough   2. Chest pain, unspecified type   3. Acute cystitis without hematuria     Tests performed today include:  An EKG of your heart  A chest x-ray -shows some signs of bronchitis  Cardiac enzymes - a blood test for heart muscle damage, no sign of heart attack  D-dimer -screening test for blood clot was negative  Urine test and urine culture -suggest signs of UTI  CT scan of the abdomen and pelvis -no stones that are blocking the ureters today  Blood counts and electrolytes  Liver function test and pancreas function test were normal  Vital signs. See below for your results today.   Medications prescribed:   Tussinex - narcotic cough suppressant syrup  You have been prescribed narcotic cough suppressant such as Tussinex: DO NOT drive or perform any activities that require you to be awake and alert because this medicine can make you drowsy.   Take any prescribed medications only as directed.  Follow-up instructions: Please follow-up with your primary care provider as soon as you can for further evaluation of your symptoms.   Return instructions:  SEEK IMMEDIATE MEDICAL ATTENTION IF:  You have severe chest pain, especially if the pain is crushing or pressure-like and spreads to the arms, back, neck, or jaw, or if you have sweating, nausea (feeling sick to your stomach), or shortness of breath. THIS IS AN EMERGENCY. Don't wait to see if the pain will go away. Get medical help at once. Call 911 or 0 (operator). DO NOT drive yourself to the hospital.   Your chest pain gets worse and does not go away with rest.   You have an attack of chest pain lasting longer than usual, despite rest and treatment with the medications your caregiver has prescribed.   You wake from sleep with chest pain or shortness of breath.  You feel dizzy or faint.  You have chest pain not typical of your usual pain  for which you originally saw your caregiver.   You have any other emergent concerns regarding your health.  Additional Information: Chest pain comes from many different causes. Your caregiver has diagnosed you as having chest pain that is not specific for one problem, but does not require admission.  You are at low risk for an acute heart condition or other serious illness.   Your vital signs today were: BP (!) 118/47   Pulse 79   Temp 98.2 F (36.8 C)   Resp 18   SpO2 94%  If your blood pressure (BP) was elevated above 135/85 this visit, please have this repeated by your doctor within one month. --------------

## 2020-11-12 DIAGNOSIS — R300 Dysuria: Secondary | ICD-10-CM | POA: Diagnosis not present

## 2020-11-12 DIAGNOSIS — N390 Urinary tract infection, site not specified: Secondary | ICD-10-CM | POA: Diagnosis not present

## 2020-11-12 DIAGNOSIS — E119 Type 2 diabetes mellitus without complications: Secondary | ICD-10-CM | POA: Diagnosis not present

## 2020-11-12 DIAGNOSIS — R051 Acute cough: Secondary | ICD-10-CM | POA: Diagnosis not present

## 2020-11-12 DIAGNOSIS — R8290 Unspecified abnormal findings in urine: Secondary | ICD-10-CM | POA: Diagnosis not present

## 2020-11-12 DIAGNOSIS — R350 Frequency of micturition: Secondary | ICD-10-CM | POA: Diagnosis not present

## 2020-11-12 LAB — URINE CULTURE: Culture: 30000 — AB

## 2020-11-13 ENCOUNTER — Telehealth: Payer: Self-pay | Admitting: Emergency Medicine

## 2020-11-13 NOTE — Telephone Encounter (Signed)
Post ED Visit - Positive Culture Follow-up  Culture report reviewed by antimicrobial stewardship pharmacist: [facility] Team []  Elenor Quinones, Pharm.D. []  Heide Guile, Pharm.D., BCPS AQ-ID []  Parks Neptune, Pharm.D., BCPS []  Alycia Rossetti, Pharm.D., BCPS []  [place], Pharm.D., BCPS, AAHIVP []  Legrand Como, Pharm.D., BCPS, AAHIVP []  Salome Arnt, PharmD, BCPS []  Johnnette Gourd, PharmD, BCPS []  Hughes Better, PharmD, BCPS []  Leeroy Cha, PharmD []  Laqueta Linden, PharmD, BCPS []  Albertina Parr, PharmD  [place] Team []  Leodis Sias, PharmD []  Lindell Spar, PharmD []  Royetta Asal, PharmD []  Graylin Shiver, Rph []  Rema Fendt) Glennon Mac, PharmD []  Arlyn Dunning, PharmD []  Netta Cedars, PharmD []  Dia Sitter, PharmD []  Leone Haven, PharmD []  Gretta Arab, PharmD []  Theodis Shove, PharmD []  Peggyann Juba, PharmD []  Reuel Boom, PharmD   Positive urine culture Treated with cipro, has f/u with doctor, no patient follow-up is required at this time.  Hazle Nordmann 11/13/2020, 11:08 AM

## 2020-11-21 DIAGNOSIS — J4521 Mild intermittent asthma with (acute) exacerbation: Secondary | ICD-10-CM | POA: Diagnosis not present

## 2020-11-26 DIAGNOSIS — M25512 Pain in left shoulder: Secondary | ICD-10-CM | POA: Diagnosis not present

## 2020-11-26 DIAGNOSIS — R051 Acute cough: Secondary | ICD-10-CM | POA: Diagnosis not present

## 2020-11-26 DIAGNOSIS — R5383 Other fatigue: Secondary | ICD-10-CM | POA: Diagnosis not present

## 2020-11-26 DIAGNOSIS — E785 Hyperlipidemia, unspecified: Secondary | ICD-10-CM | POA: Diagnosis not present

## 2020-11-26 DIAGNOSIS — E119 Type 2 diabetes mellitus without complications: Secondary | ICD-10-CM | POA: Diagnosis not present

## 2020-11-26 DIAGNOSIS — R059 Cough, unspecified: Secondary | ICD-10-CM | POA: Diagnosis not present

## 2020-11-26 DIAGNOSIS — M545 Low back pain, unspecified: Secondary | ICD-10-CM | POA: Diagnosis not present

## 2020-11-26 DIAGNOSIS — N390 Urinary tract infection, site not specified: Secondary | ICD-10-CM | POA: Diagnosis not present

## 2020-11-26 DIAGNOSIS — M19012 Primary osteoarthritis, left shoulder: Secondary | ICD-10-CM | POA: Diagnosis not present

## 2020-11-27 DIAGNOSIS — R0602 Shortness of breath: Secondary | ICD-10-CM | POA: Diagnosis not present

## 2020-11-27 DIAGNOSIS — R051 Acute cough: Secondary | ICD-10-CM | POA: Diagnosis not present

## 2020-12-03 DIAGNOSIS — B372 Candidiasis of skin and nail: Secondary | ICD-10-CM | POA: Diagnosis not present

## 2020-12-03 DIAGNOSIS — R110 Nausea: Secondary | ICD-10-CM | POA: Diagnosis not present

## 2020-12-03 DIAGNOSIS — J019 Acute sinusitis, unspecified: Secondary | ICD-10-CM | POA: Diagnosis not present

## 2020-12-03 DIAGNOSIS — M545 Low back pain, unspecified: Secondary | ICD-10-CM | POA: Diagnosis not present

## 2020-12-03 DIAGNOSIS — M25512 Pain in left shoulder: Secondary | ICD-10-CM | POA: Diagnosis not present

## 2020-12-03 DIAGNOSIS — E1165 Type 2 diabetes mellitus with hyperglycemia: Secondary | ICD-10-CM | POA: Diagnosis not present

## 2020-12-03 DIAGNOSIS — H6123 Impacted cerumen, bilateral: Secondary | ICD-10-CM | POA: Diagnosis not present

## 2020-12-10 DIAGNOSIS — J3489 Other specified disorders of nose and nasal sinuses: Secondary | ICD-10-CM | POA: Diagnosis not present

## 2020-12-10 DIAGNOSIS — J329 Chronic sinusitis, unspecified: Secondary | ICD-10-CM | POA: Diagnosis not present

## 2020-12-10 DIAGNOSIS — J343 Hypertrophy of nasal turbinates: Secondary | ICD-10-CM | POA: Diagnosis not present

## 2020-12-10 DIAGNOSIS — J310 Chronic rhinitis: Secondary | ICD-10-CM | POA: Diagnosis not present

## 2020-12-10 DIAGNOSIS — F17200 Nicotine dependence, unspecified, uncomplicated: Secondary | ICD-10-CM | POA: Diagnosis not present

## 2020-12-10 DIAGNOSIS — Z87898 Personal history of other specified conditions: Secondary | ICD-10-CM | POA: Diagnosis not present

## 2020-12-10 DIAGNOSIS — R0981 Nasal congestion: Secondary | ICD-10-CM | POA: Diagnosis not present

## 2020-12-10 DIAGNOSIS — R059 Cough, unspecified: Secondary | ICD-10-CM | POA: Diagnosis not present

## 2020-12-10 DIAGNOSIS — J449 Chronic obstructive pulmonary disease, unspecified: Secondary | ICD-10-CM | POA: Diagnosis not present

## 2020-12-10 DIAGNOSIS — I10 Essential (primary) hypertension: Secondary | ICD-10-CM | POA: Diagnosis not present

## 2020-12-10 DIAGNOSIS — H6123 Impacted cerumen, bilateral: Secondary | ICD-10-CM | POA: Diagnosis not present

## 2020-12-10 DIAGNOSIS — J342 Deviated nasal septum: Secondary | ICD-10-CM | POA: Diagnosis not present

## 2020-12-12 DIAGNOSIS — M5136 Other intervertebral disc degeneration, lumbar region: Secondary | ICD-10-CM | POA: Diagnosis not present

## 2020-12-16 ENCOUNTER — Other Ambulatory Visit: Payer: Self-pay | Admitting: Orthopedic Surgery

## 2020-12-16 ENCOUNTER — Other Ambulatory Visit: Payer: Self-pay | Admitting: Radiology

## 2020-12-16 DIAGNOSIS — M5416 Radiculopathy, lumbar region: Secondary | ICD-10-CM

## 2020-12-16 DIAGNOSIS — M5136 Other intervertebral disc degeneration, lumbar region: Secondary | ICD-10-CM

## 2020-12-16 DIAGNOSIS — M51369 Other intervertebral disc degeneration, lumbar region without mention of lumbar back pain or lower extremity pain: Secondary | ICD-10-CM

## 2020-12-18 DIAGNOSIS — J320 Chronic maxillary sinusitis: Secondary | ICD-10-CM | POA: Diagnosis not present

## 2020-12-18 DIAGNOSIS — J3489 Other specified disorders of nose and nasal sinuses: Secondary | ICD-10-CM | POA: Diagnosis not present

## 2020-12-18 DIAGNOSIS — J0110 Acute frontal sinusitis, unspecified: Secondary | ICD-10-CM | POA: Diagnosis not present

## 2020-12-18 DIAGNOSIS — J329 Chronic sinusitis, unspecified: Secondary | ICD-10-CM | POA: Diagnosis not present

## 2020-12-18 DIAGNOSIS — J310 Chronic rhinitis: Secondary | ICD-10-CM | POA: Diagnosis not present

## 2020-12-18 DIAGNOSIS — J321 Chronic frontal sinusitis: Secondary | ICD-10-CM | POA: Diagnosis not present

## 2020-12-21 DIAGNOSIS — J4521 Mild intermittent asthma with (acute) exacerbation: Secondary | ICD-10-CM | POA: Diagnosis not present

## 2020-12-27 DIAGNOSIS — J343 Hypertrophy of nasal turbinates: Secondary | ICD-10-CM | POA: Diagnosis not present

## 2020-12-27 DIAGNOSIS — J320 Chronic maxillary sinusitis: Secondary | ICD-10-CM | POA: Diagnosis not present

## 2020-12-27 DIAGNOSIS — R059 Cough, unspecified: Secondary | ICD-10-CM | POA: Diagnosis not present

## 2020-12-27 DIAGNOSIS — R0981 Nasal congestion: Secondary | ICD-10-CM | POA: Diagnosis not present

## 2020-12-27 DIAGNOSIS — J342 Deviated nasal septum: Secondary | ICD-10-CM | POA: Diagnosis not present

## 2020-12-27 DIAGNOSIS — F17200 Nicotine dependence, unspecified, uncomplicated: Secondary | ICD-10-CM | POA: Diagnosis not present

## 2020-12-29 ENCOUNTER — Ambulatory Visit
Admission: RE | Admit: 2020-12-29 | Discharge: 2020-12-29 | Disposition: A | Discharge status: Home / Self Care | Payer: Medicare Other | Source: Ambulatory Visit | Attending: Orthopedic Surgery | Admitting: Orthopedic Surgery

## 2020-12-29 ENCOUNTER — Other Ambulatory Visit: Payer: Self-pay

## 2020-12-29 DIAGNOSIS — M545 Low back pain, unspecified: Secondary | ICD-10-CM | POA: Diagnosis not present

## 2020-12-29 DIAGNOSIS — M5136 Other intervertebral disc degeneration, lumbar region: Secondary | ICD-10-CM

## 2020-12-29 DIAGNOSIS — M48061 Spinal stenosis, lumbar region without neurogenic claudication: Secondary | ICD-10-CM | POA: Diagnosis not present

## 2020-12-29 DIAGNOSIS — M5416 Radiculopathy, lumbar region: Secondary | ICD-10-CM

## 2020-12-31 DIAGNOSIS — M5136 Other intervertebral disc degeneration, lumbar region: Secondary | ICD-10-CM | POA: Diagnosis not present

## 2021-01-01 DIAGNOSIS — R053 Chronic cough: Secondary | ICD-10-CM | POA: Diagnosis not present

## 2021-01-01 DIAGNOSIS — Z5181 Encounter for therapeutic drug level monitoring: Secondary | ICD-10-CM | POA: Diagnosis not present

## 2021-01-01 DIAGNOSIS — E1165 Type 2 diabetes mellitus with hyperglycemia: Secondary | ICD-10-CM | POA: Diagnosis not present

## 2021-01-01 DIAGNOSIS — M545 Low back pain, unspecified: Secondary | ICD-10-CM | POA: Diagnosis not present

## 2021-01-01 DIAGNOSIS — E785 Hyperlipidemia, unspecified: Secondary | ICD-10-CM | POA: Diagnosis not present

## 2021-01-01 DIAGNOSIS — N390 Urinary tract infection, site not specified: Secondary | ICD-10-CM | POA: Diagnosis not present

## 2021-01-16 DIAGNOSIS — M160 Bilateral primary osteoarthritis of hip: Secondary | ICD-10-CM | POA: Diagnosis not present

## 2021-01-16 DIAGNOSIS — M47816 Spondylosis without myelopathy or radiculopathy, lumbar region: Secondary | ICD-10-CM | POA: Diagnosis not present

## 2021-01-16 DIAGNOSIS — M5416 Radiculopathy, lumbar region: Secondary | ICD-10-CM | POA: Diagnosis not present

## 2021-01-16 DIAGNOSIS — M170 Bilateral primary osteoarthritis of knee: Secondary | ICD-10-CM | POA: Diagnosis not present

## 2021-01-21 DIAGNOSIS — J4521 Mild intermittent asthma with (acute) exacerbation: Secondary | ICD-10-CM | POA: Diagnosis not present

## 2021-02-04 DIAGNOSIS — E785 Hyperlipidemia, unspecified: Secondary | ICD-10-CM | POA: Diagnosis not present

## 2021-02-04 DIAGNOSIS — N390 Urinary tract infection, site not specified: Secondary | ICD-10-CM | POA: Diagnosis not present

## 2021-02-04 DIAGNOSIS — E1165 Type 2 diabetes mellitus with hyperglycemia: Secondary | ICD-10-CM | POA: Diagnosis not present

## 2021-02-04 DIAGNOSIS — R3915 Urgency of urination: Secondary | ICD-10-CM | POA: Diagnosis not present

## 2021-02-07 DIAGNOSIS — R8279 Other abnormal findings on microbiological examination of urine: Secondary | ICD-10-CM | POA: Diagnosis not present

## 2021-02-07 DIAGNOSIS — N3020 Other chronic cystitis without hematuria: Secondary | ICD-10-CM | POA: Diagnosis not present

## 2021-02-07 DIAGNOSIS — N10 Acute tubulo-interstitial nephritis: Secondary | ICD-10-CM | POA: Diagnosis not present

## 2021-02-20 ENCOUNTER — Ambulatory Visit (INDEPENDENT_AMBULATORY_CARE_PROVIDER_SITE_OTHER): Payer: Medicare Other | Admitting: Pulmonary Disease

## 2021-02-20 ENCOUNTER — Other Ambulatory Visit: Payer: Self-pay

## 2021-02-20 ENCOUNTER — Encounter: Payer: Self-pay | Admitting: Pulmonary Disease

## 2021-02-20 VITALS — BP 130/64 | HR 81 | Ht 63.0 in | Wt 284.0 lb

## 2021-02-20 DIAGNOSIS — R059 Cough, unspecified: Secondary | ICD-10-CM | POA: Diagnosis not present

## 2021-02-20 DIAGNOSIS — J329 Chronic sinusitis, unspecified: Secondary | ICD-10-CM | POA: Diagnosis not present

## 2021-02-20 DIAGNOSIS — F17210 Nicotine dependence, cigarettes, uncomplicated: Secondary | ICD-10-CM | POA: Diagnosis not present

## 2021-02-20 DIAGNOSIS — J4521 Mild intermittent asthma with (acute) exacerbation: Secondary | ICD-10-CM | POA: Diagnosis not present

## 2021-02-20 DIAGNOSIS — K219 Gastro-esophageal reflux disease without esophagitis: Secondary | ICD-10-CM | POA: Diagnosis not present

## 2021-02-20 DIAGNOSIS — J4520 Mild intermittent asthma, uncomplicated: Secondary | ICD-10-CM | POA: Diagnosis not present

## 2021-02-20 DIAGNOSIS — J302 Other seasonal allergic rhinitis: Secondary | ICD-10-CM | POA: Diagnosis not present

## 2021-02-20 DIAGNOSIS — F17200 Nicotine dependence, unspecified, uncomplicated: Secondary | ICD-10-CM

## 2021-02-20 NOTE — Progress Notes (Signed)
Synopsis: Referred in July 2022 for cough by [facility]*  Subjective:   PATIENT ID: Gina [name] GENDER: female DOB: 02/14/1966, MRN: [id number]  Chief Complaint  Patient presents with   Consult    Cough for a few months    This is a 55 year old, morbidly obese female, past medical history of chronic pain, asthma, anxiety, reflux, hypertension, hyperlipidemia.  She presents today with complaints of cough.  Cough has been going on for several years.  She is a longstanding smoker.  She unfortunately is still smoking 1 to 1.5 packs/day.  She uses several antihistamines, nasal sprays, inhalers.  She has been on lisinopril for 10+ years.  Overall multifactorial etiology for cough.    Past Medical History:  Diagnosis Date   Anemia    Anxiety    Arthritis    Asthma    Back pain    Chronic pain    Diabetes mellitus    type 2   Dyspnea    with exersion    Family history of adverse reaction to anesthesia    youngest daughter hard to wake up both daughters post op n/v   Fibromyalgia    GERD (gastroesophageal reflux disease)    Headache    migraines   History of kidney stones    2 big stones in left kidney now   History of recurrent UTIs    last uti 3 weeks ago   Hyperlipidemia    Hypertension    IBS (irritable bowel syndrome)    Obesities, morbid (HCC)    Pneumonia 06/2016   PONV (postoperative nausea and vomiting) 2004   came back after hystectomy bowels locked up on life support for 1 month    Sinusitis    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 at [facility]   Umbilical hernia    Wound discharge    open wound right leg going to wound center changes dressing qday with santyl cream quarter size wound     Family History  Problem Relation Age of Onset   Cancer Mother    Cirrhosis Mother    Heart failure Father    Diabetes Father    Hypertension Father    Colon cancer Neg Hx      Past Surgical History:  Procedure Laterality Date   ABDOMINAL  HYSTERECTOMY     1 ovary left   CARPAL TUNNEL RELEASE Right    CESAREAN SECTION     x 2   CHOLECYSTECTOMY     COLONOSCOPY WITH PROPOFOL N/A 12/03/2016   Procedure: COLONOSCOPY WITH PROPOFOL;  Surgeon: Doran Stabler, MD;  Location: [facility];  Service: Gastroenterology;  Laterality: N/A;   CYSTECTOMY     left foot   CYSTOSCOPY/URETEROSCOPY/HOLMIUM LASER/STENT PLACEMENT Left 03/18/2020   Procedure: CYSTOSCOPY/LEFT RETROGRADE PYELOGRAM, STENT PLACEMENT;  Surgeon: Ceasar Mons, MD;  Location: [facility];  Service: Urology;  Laterality: Left;   ESOPHAGOGASTRODUODENOSCOPY (EGD) WITH PROPOFOL N/A 12/03/2016   Procedure: ESOPHAGOGASTRODUODENOSCOPY (EGD) WITH PROPOFOL;  Surgeon: Doran Stabler, MD;  Location: [facility];  Service: Gastroenterology;  Laterality: N/A;   LITHOTRIPSY     MULTIPLE TOOTH EXTRACTIONS     URETEROSCOPY WITH HOLMIUM LASER LITHOTRIPSY Left 04/09/2020   Procedure: URETEROSCOPY WITH HOLMIUM LASER LITHOTRIPSY/ STENT CHANGE;  Surgeon: Festus Aloe, MD;  Location: [facility];  Service: Urology;  Laterality: Left;    Social History   Socioeconomic History   Marital status: Married    Spouse  name: Jenny Reichmann   Number of children: 3   Years of education: 11   Highest education level: Not on file  Occupational History   Occupation: Disabled  Tobacco Use   Smoking status: Every Day    Packs/day: 1.00    Years: 33.00    Pack years: 33.00    Types: Cigarettes    Start date: 1985   Smokeless tobacco: Never  Vaping Use   Vaping Use: Never used  Substance and Sexual Activity   Alcohol use: No    Comment: Social   Drug use: No   Sexual activity: Yes    Birth control/protection: None, Surgical    Comment: Hysterectomy  Other Topics Concern   Not on file  Social History Narrative   Smokes 1/2 ppd. Lives with husband and son.  Unemployed/disabled.  Independent of ADLs.   Social Determinants of Health   Financial Resource Strain: Not on file  Food  Insecurity: Not on file  Transportation Needs: Not on file  Physical Activity: Not on file  Stress: Not on file  Social Connections: Not on file  Intimate Partner Violence: Not on file     Allergies  Allergen Reactions   Darvocet [Propoxyphene N-Acetaminophen] Nausea And Vomiting   Fish Allergy Anaphylaxis   Morphine And Related Itching   Penicillins Hives and Swelling    Tolerates Rocephin  Swelling all over body  Has patient had a PCN reaction causing immediate rash, facial/tongue/throat swelling, SOB or lightheadedness with hypotension: Yes Has patient had a PCN reaction causing severe rash involving mucus membranes or skin necrosis: Yes Has patient had a PCN reaction that required hospitalization Unknown Has patient had a PCN reaction occurring within the last 10 years: No  If all of the above answers are "NO", then may proceed with Cephalosporin use.    Tape Hives    Adhesive tape   Fentanyl Nausea Only   Other     Bee venom-anaphylaxis      Outpatient Medications Prior to Visit  Medication Sig Dispense Refill   acetaminophen (TYLENOL) 500 MG tablet Take 1,000 mg by mouth every 6 (six) hours as needed for mild pain.     ALPRAZolam (XANAX) 1 MG tablet Take 1 mg by mouth 2 (two) times daily as needed for anxiety.      atorvastatin (LIPITOR) 20 MG tablet TAKE 1 TABLET (20 MG TOTAL) DAILY BY MOUTH. 90 tablet 2   atorvastatin (LIPITOR) 40 MG tablet Take 40 mg by mouth daily.     chlorpheniramine-HYDROcodone (TUSSIONEX PENNKINETIC ER) 10-8 MG/5ML SUER Take 5 mLs by mouth every 12 (twelve) hours as needed for cough. 140 mL 0   doxycycline (VIBRAMYCIN) 100 MG capsule Take 1 capsule (100 mg total) by mouth 2 (two) times daily. 20 capsule 0   fluticasone (FLONASE) 50 MCG/ACT nasal spray Place 1 spray into both nostrils daily.     Fluticasone-Salmeterol (WIXELA INHUB) 250-50 MCG/DOSE AEPB Inhale 1 puff into the lungs 2 (two) times daily. 60 each 1   glucose blood (ONE TOUCH ULTRA  TEST) test strip USE TO TEST BLOOD SUGAR 3 TIMES DAILY 100 each 3   ibuprofen (ADVIL) 800 MG tablet Take 1 tablet (800 mg total) by mouth 3 (three) times daily. 30 tablet 0   insulin aspart (NOVOLOG) 100 UNIT/ML injection Inject 0-20 Units into the skin 3 (three) times daily with meals. 10 mL 11   insulin aspart (NOVOLOG) 100 UNIT/ML injection Inject 0-5 Units into the skin at bedtime. 10 mL  11   lactulose (CHRONULAC) 10 GM/15ML solution Take 30 mLs (20 g total) by mouth 2 (two) times daily. 236 mL 0   lisinopril (ZESTRIL) 10 MG tablet Take 10 mg by mouth daily.     metFORMIN (GLUCOPHAGE) 850 MG tablet TAKE 1 TABLET (850 MG TOTAL) BY MOUTH 2 (TWO) TIMES DAILY WITH A MEAL. 180 tablet 2   nitrofurantoin (MACRODANTIN) 50 MG capsule Take 50 mg by mouth at bedtime.     nystatin (NYSTATIN) powder Apply topically 3 (three) times daily as needed. (Patient taking differently: Apply 1 application topically 3 (three) times daily as needed (chaffing).) 60 g 6   omeprazole (PRILOSEC) 20 MG capsule TAKE 1 CAPSULE (20 MG TOTAL) BY MOUTH DAILY BEFORE BREAKFAST. 90 capsule 2   opium-belladonna (B&O SUPPRETTES) 16.2-60 MG suppository Place 1 suppository rectally every 6 (six) hours. 30 suppository 0   oxybutynin (DITROPAN) 5 MG tablet Take 2 tablets (10 mg total) by mouth 3 (three) times daily. 60 tablet 0   oxyCODONE (OXY IR/ROXICODONE) 5 MG immediate release tablet Take 1 tablet (5 mg total) by mouth every 8 (eight) hours as needed for severe pain. 30 tablet 0   OZEMPIC, 0.25 OR 0.5 MG/DOSE, 2 MG/1.5ML SOPN Inject 2 mg into the skin once a week.     polyvinyl alcohol (LIQUIFILM TEARS) 1.4 % ophthalmic solution Place 1 drop into both eyes as needed for dry eyes.     potassium chloride SA (KLOR-CON) 20 MEQ tablet Take 2 tablets (40 mEq total) by mouth 2 (two) times daily. 20 tablet 0   pregabalin (LYRICA) 200 MG capsule TAKE 1 CAPSULE BY MOUTH THREE TIMES A DAY (Patient taking differently: Take 200 mg by mouth in the  morning, at noon, and at bedtime.) 90 capsule 2   tamsulosin (FLOMAX) 0.4 MG CAPS capsule Take 1 capsule (0.4 mg total) by mouth daily. 30 capsule 0   VENTOLIN HFA 108 (90 Base) MCG/ACT inhaler INHALE 2 PUFFS EVERY 6 (SIX) HOURS AS NEEDED INTO THE LUNGS FOR WHEEZING OR SHORTNESS OF BREATH. (Patient taking differently: Inhale 2 puffs into the lungs every 6 (six) hours as needed for wheezing or shortness of breath.) 18 Inhaler 4   predniSONE (DELTASONE) 20 MG tablet Take 2 tablets (40 mg total) by mouth daily. (Patient not taking: Reported on 02/20/2021) 6 tablet 0   No facility-administered medications prior to visit.    Review of Systems  Constitutional:  Negative for chills, fever, malaise/fatigue and weight loss.  HENT:  Negative for hearing loss, sore throat and tinnitus.   Eyes:  Negative for blurred vision and double vision.  Respiratory:  Positive for cough, sputum production and wheezing. Negative for hemoptysis, shortness of breath and stridor.   Cardiovascular:  Positive for leg swelling. Negative for chest pain, palpitations, orthopnea and PND.  Gastrointestinal:  Negative for abdominal pain, constipation, diarrhea, heartburn, nausea and vomiting.  Genitourinary:  Negative for dysuria, hematuria and urgency.  Musculoskeletal:  Negative for joint pain and myalgias.  Skin:  Negative for itching and rash.  Neurological:  Negative for dizziness, tingling, weakness and headaches.  Endo/Heme/Allergies:  Negative for environmental allergies. Does not bruise/bleed easily.  Psychiatric/Behavioral:  Positive for depression. The patient is nervous/anxious. The patient does not have insomnia.   All other systems reviewed and are negative.   Objective:  Physical Exam Vitals reviewed.  Constitutional:      General: She is not in acute distress.    Appearance: She is well-developed. She is obese.  HENT:     Head: Normocephalic and atraumatic.  Eyes:     General: No scleral icterus.     Conjunctiva/sclera: Conjunctivae normal.     Pupils: Pupils are equal, round, and reactive to light.  Neck:     Vascular: No JVD.     Trachea: No tracheal deviation.  Cardiovascular:     Rate and Rhythm: Normal rate and regular rhythm.     Heart sounds: Normal heart sounds. No murmur heard. Pulmonary:     Effort: Pulmonary effort is normal. No tachypnea, accessory muscle usage or respiratory distress.     Breath sounds: No stridor. No wheezing, rhonchi or rales.  Abdominal:     General: Bowel sounds are normal. There is no distension.     Palpations: Abdomen is soft.     Tenderness: There is no abdominal tenderness.  Musculoskeletal:        General: No tenderness.     Cervical back: Neck supple.     Right lower leg: Edema present.     Left lower leg: Edema present.  Lymphadenopathy:     Cervical: No cervical adenopathy.  Skin:    General: Skin is warm and dry.     Capillary Refill: Capillary refill takes less than 2 seconds.     Findings: No rash.  Neurological:     Mental Status: She is alert and oriented to person, place, and time.  Psychiatric:        Behavior: Behavior normal.     Vitals:   02/20/21 0853  BP: 130/64  Pulse: 81  SpO2: 97%  Weight: 284 lb (128.8 kg)  Height: 5\' 3"  (1.6 m)   97% on RA BMI Readings from Last 3 Encounters:  02/20/21 50.31 kg/m  04/03/20 51.55 kg/m  03/26/20 51.67 kg/m   Wt Readings from Last 3 Encounters:  02/20/21 284 lb (128.8 kg)  04/03/20 291 lb (132 kg)  03/26/20 291 lb 10.7 oz (132.3 kg)     CBC    Component Value Date/Time   WBC 3.4 (L) 11/10/2020 1206   RBC 4.68 11/10/2020 1206   HGB 13.0 11/10/2020 1206   HGB 11.1 (L) 03/05/2020 1150   HGB 12.5 04/23/2014 1104   HCT 41.0 11/10/2020 1206   HCT 38.8 04/23/2014 1104   PLT 178 11/10/2020 1206   PLT 204 03/05/2020 1150   PLT 205 04/23/2014 1104   MCV 87.6 11/10/2020 1206   MCV 91.2 04/23/2014 1104   MCH 27.8 11/10/2020 1206   MCHC 31.7 11/10/2020 1206   RDW  16.2 (H) 11/10/2020 1206   RDW 16.5 (H) 04/23/2014 1104   LYMPHSABS 1.2 03/26/2020 0538   LYMPHSABS 2.2 04/23/2014 1104   MONOABS 0.4 03/26/2020 0538   MONOABS 0.3 04/23/2014 1104   EOSABS 0.4 03/26/2020 0538   EOSABS 0.5 04/23/2014 1104   BASOSABS 0.0 03/26/2020 0538   BASOSABS 0.0 04/23/2014 1104    Chest Imaging: No recent chest imaging  Chest x-ray 11/10/2020: Chronic vascular congestion, hypoinflated. The patient's images have been independently reviewed by me.    Pulmonary Functions Testing Results: No flowsheet data found.  FeNO:   Pathology:   Echocardiogram:   Heart Catheterization:     Assessment & Plan:     ICD-10-CM   1. Cough  R05.9     2. Chronic sinusitis, unspecified location  J32.9     3. Gastroesophageal reflux disease, unspecified whether esophagitis present  K21.9     4. Mild intermittent asthma without complication  D63.87  5. Seasonal allergies  J30.2     6. Current smoker  F17.200 Ambulatory Referral for Lung Cancer Scre      Discussion:  This is a 55 year old female, multifactorial etiology of cough.  Longstanding history of smoking.  No recent axial CT imaging of the chest.  She does qualify for lung cancer screening we talked about this today.  She has had a cough for a long time which I think is related to her chronic sinus issues, reflux that is not well managed, and daily 1 to 1.5 packs of smoking per day.  Plan: Patient was counseled on smoking cessation please see separate documentation. She can continue her PPI at Continue her antihistamines Continue nasal sprays Continue her current inhaler regimen I doubt her cough is ever gone to go away until she quit smoking. She is on lisinopril and has been on this for 10 years.  Could consider coming off of this to see if it plays a role in her cough however once again I suspect she coughs related to smoking daily.    Current Outpatient Medications:    acetaminophen (TYLENOL) 500  MG tablet, Take 1,000 mg by mouth every 6 (six) hours as needed for mild pain., Disp: , Rfl:    ALPRAZolam (XANAX) 1 MG tablet, Take 1 mg by mouth 2 (two) times daily as needed for anxiety. , Disp: , Rfl:    atorvastatin (LIPITOR) 20 MG tablet, TAKE 1 TABLET (20 MG TOTAL) DAILY BY MOUTH., Disp: 90 tablet, Rfl: 2   atorvastatin (LIPITOR) 40 MG tablet, Take 40 mg by mouth daily., Disp: , Rfl:    chlorpheniramine-HYDROcodone (TUSSIONEX PENNKINETIC ER) 10-8 MG/5ML SUER, Take 5 mLs by mouth every 12 (twelve) hours as needed for cough., Disp: 140 mL, Rfl: 0   doxycycline (VIBRAMYCIN) 100 MG capsule, Take 1 capsule (100 mg total) by mouth 2 (two) times daily., Disp: 20 capsule, Rfl: 0   fluticasone (FLONASE) 50 MCG/ACT nasal spray, Place 1 spray into both nostrils daily., Disp: , Rfl:    Fluticasone-Salmeterol (WIXELA INHUB) 250-50 MCG/DOSE AEPB, Inhale 1 puff into the lungs 2 (two) times daily., Disp: 60 each, Rfl: 1   glucose blood (ONE TOUCH ULTRA TEST) test strip, USE TO TEST BLOOD SUGAR 3 TIMES DAILY, Disp: 100 each, Rfl: 3   ibuprofen (ADVIL) 800 MG tablet, Take 1 tablet (800 mg total) by mouth 3 (three) times daily., Disp: 30 tablet, Rfl: 0   insulin aspart (NOVOLOG) 100 UNIT/ML injection, Inject 0-20 Units into the skin 3 (three) times daily with meals., Disp: 10 mL, Rfl: 11   insulin aspart (NOVOLOG) 100 UNIT/ML injection, Inject 0-5 Units into the skin at bedtime., Disp: 10 mL, Rfl: 11   lactulose (CHRONULAC) 10 GM/15ML solution, Take 30 mLs (20 g total) by mouth 2 (two) times daily., Disp: 236 mL, Rfl: 0   lisinopril (ZESTRIL) 10 MG tablet, Take 10 mg by mouth daily., Disp: , Rfl:    metFORMIN (GLUCOPHAGE) 850 MG tablet, TAKE 1 TABLET (850 MG TOTAL) BY MOUTH 2 (TWO) TIMES DAILY WITH A MEAL., Disp: 180 tablet, Rfl: 2   nitrofurantoin (MACRODANTIN) 50 MG capsule, Take 50 mg by mouth at bedtime., Disp: , Rfl:    nystatin (NYSTATIN) powder, Apply topically 3 (three) times daily as needed. (Patient  taking differently: Apply 1 application topically 3 (three) times daily as needed (chaffing).), Disp: 60 g, Rfl: 6   omeprazole (PRILOSEC) 20 MG capsule, TAKE 1 CAPSULE (20 MG TOTAL) BY MOUTH DAILY BEFORE  BREAKFAST., Disp: 90 capsule, Rfl: 2   opium-belladonna (B&O SUPPRETTES) 16.2-60 MG suppository, Place 1 suppository rectally every 6 (six) hours., Disp: 30 suppository, Rfl: 0   oxybutynin (DITROPAN) 5 MG tablet, Take 2 tablets (10 mg total) by mouth 3 (three) times daily., Disp: 60 tablet, Rfl: 0   oxyCODONE (OXY IR/ROXICODONE) 5 MG immediate release tablet, Take 1 tablet (5 mg total) by mouth every 8 (eight) hours as needed for severe pain., Disp: 30 tablet, Rfl: 0   OZEMPIC, 0.25 OR 0.5 MG/DOSE, 2 MG/1.5ML SOPN, Inject 2 mg into the skin once a week., Disp: , Rfl:    polyvinyl alcohol (LIQUIFILM TEARS) 1.4 % ophthalmic solution, Place 1 drop into both eyes as needed for dry eyes., Disp: , Rfl:    potassium chloride SA (KLOR-CON) 20 MEQ tablet, Take 2 tablets (40 mEq total) by mouth 2 (two) times daily., Disp: 20 tablet, Rfl: 0   pregabalin (LYRICA) 200 MG capsule, TAKE 1 CAPSULE BY MOUTH THREE TIMES A DAY (Patient taking differently: Take 200 mg by mouth in the morning, at noon, and at bedtime.), Disp: 90 capsule, Rfl: 2   tamsulosin (FLOMAX) 0.4 MG CAPS capsule, Take 1 capsule (0.4 mg total) by mouth daily., Disp: 30 capsule, Rfl: 0   VENTOLIN HFA 108 (90 Base) MCG/ACT inhaler, INHALE 2 PUFFS EVERY 6 (SIX) HOURS AS NEEDED INTO THE LUNGS FOR WHEEZING OR SHORTNESS OF BREATH. (Patient taking differently: Inhale 2 puffs into the lungs every 6 (six) hours as needed for wheezing or shortness of breath.), Disp: 18 Inhaler, Rfl: 4   predniSONE (DELTASONE) 20 MG tablet, Take 2 tablets (40 mg total) by mouth daily. (Patient not taking: Reported on 02/20/2021), Disp: 6 tablet, Rfl: 0   Garner Nash, DO [facility] 02/20/2021 9:28 AM

## 2021-02-20 NOTE — Progress Notes (Signed)
Smoking Cessation Counseling:   The patient's current tobacco use: 1.5ppd, 18 years  The patient was advised to quit and impact of smoking on their health.  I assessed the patient's willingness to attempt to quit. I provided methods and skills for cessation. We reviewed medication management of smoking session drugs if appropriate. Resources to help quit smoking were provided. A smoking cessation quit date was set: Oct 12th (birthday)  Follow-up was arranged in our clinic.  The amount of time spent counseling patient was 4 mins    Garner Nash, DO [facility] 02/20/2021 9:32 AM

## 2021-02-20 NOTE — Patient Instructions (Addendum)
Thank you for visiting Dr. Valeta Harms at [facility]. Today we recommend the following:  Orders Placed This Encounter  Procedures   Ambulatory Referral for Lung Cancer Scre   Keep omeprazole going  Keep antihistamines  Continue wixela  Consider coming off lisinopril, talk to PCP  Stop smoking (this is likely the culprit for your cough)   Return in about 6 months (around 08/23/2021), or if symptoms worsen or fail to improve, for with APP or Dr. Valeta Harms.    Please do your part to reduce the spread of COVID-19.    Smoking Tobacco Information, Adult Smoking tobacco can be harmful to your health. Tobacco contains a poisonous (toxic), colorless chemical called nicotine. Nicotine is addictive. It changes the brain and can make it hard to stop smoking. Tobacco also has other toxicchemicals that can hurt your body and raise your risk of many cancers. How can smoking tobacco affect me? Smoking tobacco puts you at risk for: Cancer. Smoking is most commonly associated with lung cancer, but can also lead to cancer in other parts of the body. Chronic obstructive pulmonary disease (COPD). This is a long-term lung condition that makes it hard to breathe. It also gets worse over time. High blood pressure (hypertension), heart disease, stroke, or heart attack. Lung infections, such as pneumonia. Cataracts. This is when the lenses in the eyes become clouded. Digestive problems. This may include peptic ulcers, heartburn, and gastroesophageal reflux disease (GERD). Oral health problems, such as gum disease and tooth loss. Loss of taste and smell. Smoking can affect your appearance by causing: Wrinkles. Yellow or stained teeth, fingers, and fingernails. Smoking tobacco can also affect your social life, because: It may be challenging to find places to smoke when away from home. Many workplaces, [employer], hotels, and public places are tobacco-free. Smoking is expensive. This is due to the cost of  tobacco and the long-term costs of treating health problems from smoking. Secondhand smoke may affect those around you. Secondhand smoke can cause lung cancer, breathing problems, and heart disease. Children of smokers have a higher risk for: Sudden infant death syndrome (SIDS). Ear infections. Lung infections. If you currently smoke tobacco, quitting now can help you: Lead a longer and healthier life. Look, smell, breathe, and feel better over time. Save money. Protect others from the harms of secondhand smoke. What actions can I take to prevent health problems? Quit smoking  Do not start smoking. Quit if you already do. Make a plan to quit smoking and commit to it. Look for programs to help you and ask your health care provider for recommendations and ideas. Set a date and write down all the reasons you want to quit. Let your friends and family know you are quitting so they can help and support you. Consider finding friends who also want to quit. It can be easier to quit with someone else, so that you can support each other. Talk with your health care provider about using nicotine replacement medicines to help you quit, such as gum, lozenges, patches, sprays, or pills. Do not replace cigarette smoking with electronic cigarettes, which are commonly called e-cigarettes. The safety of e-cigarettes is not known, and some may contain harmful chemicals. If you try to quit but return to smoking, stay positive. It is common to slip up when you first quit, so take it one day at a time. Be prepared for cravings. When you feel the urge to smoke, chew gum or suck on hard candy.  Lifestyle Stay busy and  take care of your body. Drink enough fluid to keep your urine pale yellow. Get plenty of exercise and eat a healthy diet. This can help prevent weight gain after quitting. Monitor your eating habits. Quitting smoking can cause you to have a larger appetite than when you smoke. Find ways to relax. Go  out with friends or family to a movie or a restaurant where people do not smoke. Ask your health care provider about having regular tests (screenings) to check for cancer. This may include blood tests, imaging tests, and other tests. Find ways to manage your stress, such as meditation, yoga, or exercise. Where to find support To get support to quit smoking, consider: Asking your health care provider for more information and resources. Taking classes to learn more about quitting smoking. Looking for local organizations that offer resources about quitting smoking. Joining a support group for people who want to quit smoking in your local community. Calling the smokefree.gov counselor helpline: 1-800-Quit-Now 4196302515) Where to find more information You may find more information about quitting smoking from: HelpGuide.org: www.helpguide.org https://hall.com/: smokefree.gov American Lung Association: www.lung.org Contact a health care provider if you: Have problems breathing. Notice that your lips, nose, or fingers turn blue. Have chest pain. Are coughing up blood. Feel faint or you pass out. Have other health changes that cause you to worry. Summary Smoking tobacco can negatively affect your health, the health of those around you, your finances, and your social life. Do not start smoking. Quit if you already do. If you need help quitting, ask your health care provider. Think about joining a support group for people who want to quit smoking in your local community. There are many effective programs that will help you to quit this behavior. This information is not intended to replace advice given to you by your health care provider. Make sure you discuss any questions you have with your healthcare provider. Document Revised: 06/18/2020 Document Reviewed: 06/18/2020 Elsevier Patient Education  2022 [employer].

## 2021-02-25 DIAGNOSIS — N3020 Other chronic cystitis without hematuria: Secondary | ICD-10-CM | POA: Diagnosis not present

## 2021-02-26 DIAGNOSIS — R8279 Other abnormal findings on microbiological examination of urine: Secondary | ICD-10-CM | POA: Diagnosis not present

## 2021-02-26 DIAGNOSIS — R1031 Right lower quadrant pain: Secondary | ICD-10-CM | POA: Diagnosis not present

## 2021-02-26 DIAGNOSIS — N3020 Other chronic cystitis without hematuria: Secondary | ICD-10-CM | POA: Diagnosis not present

## 2021-02-26 DIAGNOSIS — R1084 Generalized abdominal pain: Secondary | ICD-10-CM | POA: Diagnosis not present

## 2021-02-27 ENCOUNTER — Other Ambulatory Visit: Payer: Self-pay | Admitting: *Deleted

## 2021-02-27 DIAGNOSIS — F17210 Nicotine dependence, cigarettes, uncomplicated: Secondary | ICD-10-CM

## 2021-02-27 DIAGNOSIS — Z87891 Personal history of nicotine dependence: Secondary | ICD-10-CM

## 2021-03-02 ENCOUNTER — Inpatient Hospital Stay (HOSPITAL_COMMUNITY)
Admission: EM | Admit: 2021-03-02 | Discharge: 2021-03-04 | DRG: 689 | Disposition: A | Discharge status: Home / Self Care | Payer: Medicare Other | Attending: Student | Admitting: Student

## 2021-03-02 ENCOUNTER — Telehealth: Payer: Self-pay | Admitting: Student

## 2021-03-02 ENCOUNTER — Other Ambulatory Visit: Payer: Self-pay

## 2021-03-02 ENCOUNTER — Encounter (HOSPITAL_COMMUNITY): Payer: Self-pay

## 2021-03-02 ENCOUNTER — Emergency Department (HOSPITAL_COMMUNITY): Payer: Medicare Other

## 2021-03-02 DIAGNOSIS — E785 Hyperlipidemia, unspecified: Secondary | ICD-10-CM | POA: Diagnosis not present

## 2021-03-02 DIAGNOSIS — Z7984 Long term (current) use of oral hypoglycemic drugs: Secondary | ICD-10-CM

## 2021-03-02 DIAGNOSIS — M2578 Osteophyte, vertebrae: Secondary | ICD-10-CM | POA: Diagnosis not present

## 2021-03-02 DIAGNOSIS — E1165 Type 2 diabetes mellitus with hyperglycemia: Secondary | ICD-10-CM | POA: Diagnosis present

## 2021-03-02 DIAGNOSIS — Z8249 Family history of ischemic heart disease and other diseases of the circulatory system: Secondary | ICD-10-CM

## 2021-03-02 DIAGNOSIS — M1990 Unspecified osteoarthritis, unspecified site: Secondary | ICD-10-CM | POA: Diagnosis not present

## 2021-03-02 DIAGNOSIS — Z6841 Body Mass Index (BMI) 40.0 and over, adult: Secondary | ICD-10-CM | POA: Diagnosis not present

## 2021-03-02 DIAGNOSIS — Z833 Family history of diabetes mellitus: Secondary | ICD-10-CM

## 2021-03-02 DIAGNOSIS — J9601 Acute respiratory failure with hypoxia: Secondary | ICD-10-CM | POA: Diagnosis not present

## 2021-03-02 DIAGNOSIS — E1142 Type 2 diabetes mellitus with diabetic polyneuropathy: Secondary | ICD-10-CM | POA: Diagnosis present

## 2021-03-02 DIAGNOSIS — Z716 Tobacco abuse counseling: Secondary | ICD-10-CM

## 2021-03-02 DIAGNOSIS — M797 Fibromyalgia: Secondary | ICD-10-CM | POA: Diagnosis present

## 2021-03-02 DIAGNOSIS — K219 Gastro-esophageal reflux disease without esophagitis: Secondary | ICD-10-CM | POA: Diagnosis present

## 2021-03-02 DIAGNOSIS — N12 Tubulo-interstitial nephritis, not specified as acute or chronic: Secondary | ICD-10-CM | POA: Diagnosis not present

## 2021-03-02 DIAGNOSIS — Z960 Presence of urogenital implants: Secondary | ICD-10-CM | POA: Diagnosis present

## 2021-03-02 DIAGNOSIS — Z888 Allergy status to other drugs, medicaments and biological substances status: Secondary | ICD-10-CM

## 2021-03-02 DIAGNOSIS — Z87442 Personal history of urinary calculi: Secondary | ICD-10-CM | POA: Diagnosis not present

## 2021-03-02 DIAGNOSIS — Z91030 Bee allergy status: Secondary | ICD-10-CM

## 2021-03-02 DIAGNOSIS — Z87440 Personal history of urinary (tract) infections: Secondary | ICD-10-CM

## 2021-03-02 DIAGNOSIS — Z7952 Long term (current) use of systemic steroids: Secondary | ICD-10-CM

## 2021-03-02 DIAGNOSIS — Z9049 Acquired absence of other specified parts of digestive tract: Secondary | ICD-10-CM

## 2021-03-02 DIAGNOSIS — N200 Calculus of kidney: Secondary | ICD-10-CM | POA: Diagnosis not present

## 2021-03-02 DIAGNOSIS — Z713 Dietary counseling and surveillance: Secondary | ICD-10-CM

## 2021-03-02 DIAGNOSIS — M62830 Muscle spasm of back: Secondary | ICD-10-CM | POA: Diagnosis present

## 2021-03-02 DIAGNOSIS — Z7951 Long term (current) use of inhaled steroids: Secondary | ICD-10-CM

## 2021-03-02 DIAGNOSIS — Z90710 Acquired absence of both cervix and uterus: Secondary | ICD-10-CM | POA: Diagnosis not present

## 2021-03-02 DIAGNOSIS — E1169 Type 2 diabetes mellitus with other specified complication: Secondary | ICD-10-CM | POA: Diagnosis not present

## 2021-03-02 DIAGNOSIS — I10 Essential (primary) hypertension: Secondary | ICD-10-CM | POA: Diagnosis present

## 2021-03-02 DIAGNOSIS — G894 Chronic pain syndrome: Secondary | ICD-10-CM | POA: Diagnosis present

## 2021-03-02 DIAGNOSIS — K589 Irritable bowel syndrome without diarrhea: Secondary | ICD-10-CM | POA: Diagnosis not present

## 2021-03-02 DIAGNOSIS — M545 Low back pain, unspecified: Secondary | ICD-10-CM | POA: Diagnosis not present

## 2021-03-02 DIAGNOSIS — N10 Acute tubulo-interstitial nephritis: Secondary | ICD-10-CM | POA: Diagnosis not present

## 2021-03-02 DIAGNOSIS — F419 Anxiety disorder, unspecified: Secondary | ICD-10-CM | POA: Diagnosis present

## 2021-03-02 DIAGNOSIS — Z91048 Other nonmedicinal substance allergy status: Secondary | ICD-10-CM

## 2021-03-02 DIAGNOSIS — Z91013 Allergy to seafood: Secondary | ICD-10-CM

## 2021-03-02 DIAGNOSIS — R319 Hematuria, unspecified: Secondary | ICD-10-CM | POA: Diagnosis not present

## 2021-03-02 DIAGNOSIS — Z794 Long term (current) use of insulin: Secondary | ICD-10-CM

## 2021-03-02 DIAGNOSIS — E1140 Type 2 diabetes mellitus with diabetic neuropathy, unspecified: Secondary | ICD-10-CM

## 2021-03-02 DIAGNOSIS — Z79899 Other long term (current) drug therapy: Secondary | ICD-10-CM

## 2021-03-02 DIAGNOSIS — K439 Ventral hernia without obstruction or gangrene: Secondary | ICD-10-CM | POA: Diagnosis not present

## 2021-03-02 DIAGNOSIS — Z9189 Other specified personal risk factors, not elsewhere classified: Secondary | ICD-10-CM | POA: Diagnosis not present

## 2021-03-02 DIAGNOSIS — Z20822 Contact with and (suspected) exposure to covid-19: Secondary | ICD-10-CM | POA: Diagnosis present

## 2021-03-02 DIAGNOSIS — F17210 Nicotine dependence, cigarettes, uncomplicated: Secondary | ICD-10-CM | POA: Diagnosis not present

## 2021-03-02 DIAGNOSIS — Z880 Allergy status to penicillin: Secondary | ICD-10-CM

## 2021-03-02 DIAGNOSIS — M793 Panniculitis, unspecified: Secondary | ICD-10-CM | POA: Diagnosis not present

## 2021-03-02 DIAGNOSIS — Z885 Allergy status to narcotic agent status: Secondary | ICD-10-CM

## 2021-03-02 LAB — CBC
HCT: 38.8 % (ref 36.0–46.0)
Hemoglobin: 12.1 g/dL (ref 12.0–15.0)
MCH: 28 pg (ref 26.0–34.0)
MCHC: 31.2 g/dL (ref 30.0–36.0)
MCV: 89.8 fL (ref 80.0–100.0)
Platelets: 199 10*3/uL (ref 150–400)
RBC: 4.32 MIL/uL (ref 3.87–5.11)
RDW: 17.1 % — ABNORMAL HIGH (ref 11.5–15.5)
WBC: 7.7 10*3/uL (ref 4.0–10.5)
nRBC: 0 % (ref 0.0–0.2)

## 2021-03-02 LAB — CBC WITH DIFFERENTIAL/PLATELET
Abs Immature Granulocytes: 0.02 10*3/uL (ref 0.00–0.07)
Basophils Absolute: 0 10*3/uL (ref 0.0–0.1)
Basophils Relative: 1 %
Eosinophils Absolute: 0.4 10*3/uL (ref 0.0–0.5)
Eosinophils Relative: 6 %
HCT: 40.7 % (ref 36.0–46.0)
Hemoglobin: 12.6 g/dL (ref 12.0–15.0)
Immature Granulocytes: 0 %
Lymphocytes Relative: 19 %
Lymphs Abs: 1.1 10*3/uL (ref 0.7–4.0)
MCH: 27.7 pg (ref 26.0–34.0)
MCHC: 31 g/dL (ref 30.0–36.0)
MCV: 89.5 fL (ref 80.0–100.0)
Monocytes Absolute: 0.3 10*3/uL (ref 0.1–1.0)
Monocytes Relative: 5 %
Neutro Abs: 3.8 10*3/uL (ref 1.7–7.7)
Neutrophils Relative %: 69 %
Platelets: 179 10*3/uL (ref 150–400)
RBC: 4.55 MIL/uL (ref 3.87–5.11)
RDW: 17.2 % — ABNORMAL HIGH (ref 11.5–15.5)
WBC: 5.6 10*3/uL (ref 4.0–10.5)
nRBC: 0 % (ref 0.0–0.2)

## 2021-03-02 LAB — URINALYSIS, ROUTINE W REFLEX MICROSCOPIC
Bilirubin Urine: NEGATIVE
Glucose, UA: NEGATIVE mg/dL
Ketones, ur: NEGATIVE mg/dL
Nitrite: NEGATIVE
Protein, ur: 30 mg/dL — AB
Specific Gravity, Urine: 1.011 (ref 1.005–1.030)
WBC, UA: >50 WBC/hpf — ABNORMAL HIGH (ref 0–5)
pH: 6 (ref 5.0–8.0)

## 2021-03-02 LAB — BASIC METABOLIC PANEL
Anion gap: 8 (ref 5–15)
BUN: 13 mg/dL (ref 6–20)
CO2: 24 mmol/L (ref 22–32)
Calcium: 9.1 mg/dL (ref 8.9–10.3)
Chloride: 106 mmol/L (ref 98–111)
Creatinine, Ser: 0.95 mg/dL (ref 0.44–1.00)
GFR, Estimated: >60 mL/min (ref >60)
Glucose, Bld: 212 mg/dL — ABNORMAL HIGH (ref 70–99)
Potassium: 4.8 mmol/L (ref 3.5–5.1)
Sodium: 138 mmol/L (ref 135–145)

## 2021-03-02 LAB — CBG MONITORING, ED: Glucose-Capillary: 142 mg/dL — ABNORMAL HIGH (ref 70–99)

## 2021-03-02 LAB — CREATININE, SERUM
Creatinine, Ser: 0.83 mg/dL (ref 0.44–1.00)
GFR, Estimated: >60 mL/min (ref >60)

## 2021-03-02 LAB — LIPASE, BLOOD: Lipase: 28 U/L (ref 11–51)

## 2021-03-02 LAB — SARS CORONAVIRUS 2 (TAT 6-24 HRS): SARS Coronavirus 2: NEGATIVE

## 2021-03-02 MED ORDER — SODIUM CHLORIDE 0.9 % IV SOLN: INTRAVENOUS | Status: DC

## 2021-03-02 MED ORDER — FENTANYL CITRATE (PF) 100 MCG/2ML IJ SOLN
50.0000 ug | Freq: Once | INTRAMUSCULAR | Status: AC
  Administered 2021-03-02: 50 ug via INTRAVENOUS
  Filled 2021-03-02: qty 2

## 2021-03-02 MED ORDER — PROCHLORPERAZINE EDISYLATE 10 MG/2ML IJ SOLN
10.0000 mg | Freq: Four times a day (QID) | INTRAMUSCULAR | Status: DC | PRN
  Administered 2021-03-02 – 2021-03-04 (×4): 10 mg via INTRAVENOUS
  Filled 2021-03-02 (×4): qty 2

## 2021-03-02 MED ORDER — METHOCARBAMOL 1000 MG/10ML IJ SOLN
500.0000 mg | Freq: Four times a day (QID) | INTRAVENOUS | Status: DC | PRN
  Filled 2021-03-02: qty 5

## 2021-03-02 MED ORDER — PREGABALIN 50 MG PO CAPS
200.0000 mg | ORAL_CAPSULE | Freq: Once | ORAL | Status: AC
  Administered 2021-03-02: 200 mg via ORAL
  Filled 2021-03-02: qty 4

## 2021-03-02 MED ORDER — ENOXAPARIN SODIUM 60 MG/0.6ML IJ SOSY
60.0000 mg | PREFILLED_SYRINGE | INTRAMUSCULAR | Status: DC
  Administered 2021-03-02 – 2021-03-03 (×2): 60 mg via SUBCUTANEOUS
  Filled 2021-03-02 (×2): qty 0.6

## 2021-03-02 MED ORDER — PANTOPRAZOLE SODIUM 40 MG PO TBEC
40.0000 mg | DELAYED_RELEASE_TABLET | Freq: Every day | ORAL | Status: DC
Start: 2021-03-02 — End: 2021-03-04
  Administered 2021-03-02 – 2021-03-04 (×3): 40 mg via ORAL
  Filled 2021-03-02 (×3): qty 1

## 2021-03-02 MED ORDER — ACETAMINOPHEN 325 MG PO TABS
650.0000 mg | ORAL_TABLET | Freq: Four times a day (QID) | ORAL | Status: DC | PRN
  Administered 2021-03-03: 650 mg via ORAL
  Filled 2021-03-02: qty 2

## 2021-03-02 MED ORDER — KETOROLAC TROMETHAMINE 30 MG/ML IJ SOLN
30.0000 mg | Freq: Four times a day (QID) | INTRAMUSCULAR | Status: DC | PRN
  Administered 2021-03-03 – 2021-03-04 (×3): 30 mg via INTRAVENOUS
  Filled 2021-03-02 (×3): qty 1

## 2021-03-02 MED ORDER — ONDANSETRON HCL 4 MG/2ML IJ SOLN
4.0000 mg | Freq: Once | INTRAMUSCULAR | Status: AC
  Administered 2021-03-02: 4 mg via INTRAVENOUS
  Filled 2021-03-02: qty 2

## 2021-03-02 MED ORDER — DIPHENHYDRAMINE HCL 25 MG PO CAPS: 25.0000 mg | ORAL_CAPSULE | Freq: Four times a day (QID) | ORAL | Status: DC | PRN

## 2021-03-02 MED ORDER — MORPHINE SULFATE (PF) 4 MG/ML IV SOLN
4.0000 mg | Freq: Once | INTRAVENOUS | Status: AC
Start: 2021-03-02 — End: 2021-03-02
  Administered 2021-03-02: 4 mg via INTRAVENOUS
  Filled 2021-03-02: qty 1

## 2021-03-02 MED ORDER — SODIUM CHLORIDE 0.9 % IV SOLN
2.0000 g | INTRAVENOUS | Status: DC
  Administered 2021-03-03 – 2021-03-04 (×2): 2 g via INTRAVENOUS
  Filled 2021-03-02: qty 20
  Filled 2021-03-02: qty 2
  Filled 2021-03-02: qty 20

## 2021-03-02 MED ORDER — INSULIN ASPART 100 UNIT/ML IJ SOLN
0.0000 [IU] | Freq: Three times a day (TID) | INTRAMUSCULAR | Status: DC
  Administered 2021-03-03 (×2): 4 [IU] via SUBCUTANEOUS
  Filled 2021-03-02: qty 0.2

## 2021-03-02 MED ORDER — ACETAMINOPHEN 650 MG RE SUPP: 650.0000 mg | Freq: Four times a day (QID) | RECTAL | Status: DC | PRN

## 2021-03-02 MED ORDER — HYDROMORPHONE HCL 1 MG/ML IJ SOLN
1.0000 mg | INTRAMUSCULAR | Status: DC | PRN
  Administered 2021-03-02 – 2021-03-03 (×2): 1 mg via INTRAVENOUS
  Filled 2021-03-02 (×2): qty 1

## 2021-03-02 MED ORDER — MORPHINE SULFATE (PF) 4 MG/ML IV SOLN
4.0000 mg | INTRAVENOUS | Status: DC | PRN
Start: 2021-03-02 — End: 2021-03-02
  Filled 2021-03-02: qty 1

## 2021-03-02 MED ORDER — SODIUM CHLORIDE 0.9 % IV SOLN
1.0000 g | Freq: Once | INTRAVENOUS | Status: AC
  Administered 2021-03-02: 1 g via INTRAVENOUS
  Filled 2021-03-02: qty 10

## 2021-03-02 MED ORDER — INSULIN ASPART 100 UNIT/ML IJ SOLN
0.0000 [IU] | Freq: Every day | INTRAMUSCULAR | Status: DC
  Filled 2021-03-02: qty 0.05

## 2021-03-02 MED ORDER — HYDRALAZINE HCL 20 MG/ML IJ SOLN: 10.0000 mg | Freq: Three times a day (TID) | INTRAMUSCULAR | Status: DC | PRN

## 2021-03-02 NOTE — ED Provider Notes (Signed)
[facility] DEPT Provider Note   CSN: [id number] Arrival date & time: 03/02/21  0710     History Chief Complaint  Patient presents with   Flank Pain   Hematuria    Gina [name] is a 55 y.o. female.  The history is provided by the patient and medical records. No language interpreter was used.  Flank Pain  Hematuria   55 year old female with prior history of kidney stone, fibromyalgia, chronic pain, diabetes, obesity, who presents for evaluation of flank pain.  Patient report for the past 8 days she has been dealing with recurrent right flank pain.  Pain is radiates towards her right pelvic region, sharp stabbing moderate to severe with associated nausea and hematuria.  Pain feels very similar to prior kidney stones that she has had multiple times in the past.  She was seen at urology office 5 days ago for her symptoms.  States that she had an ultrasound performed but was not sure if she has an obstructive stone.  She was prescribed antibiotic, pain medication, and antinausea medication that she has been taking but without adequate relief.  Symptoms moderate in severity, persistent.  No fever no cough and no burning sensation when urinating.  She recently finished taking ciprofloxacin that was prescribed by her urologist.  She was told that if her symptoms worsen to come to the ER.  Past Medical History:  Diagnosis Date   Anemia    Anxiety    Arthritis    Asthma    Back pain    Chronic pain    Diabetes mellitus    type 2   Dyspnea    with exersion    Family history of adverse reaction to anesthesia    youngest daughter hard to wake up both daughters post op n/v   Fibromyalgia    GERD (gastroesophageal reflux disease)    Headache    migraines   History of kidney stones    2 big stones in left kidney now   History of recurrent UTIs    last uti 3 weeks ago   Hyperlipidemia    Hypertension    IBS (irritable bowel syndrome)    Obesities,  morbid ([place])    Pneumonia 06/2016   PONV (postoperative nausea and vomiting) 2004   came back after hystectomy bowels locked up on life support for 1 month    Sinusitis    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 at [place]   Umbilical hernia    Wound discharge    open wound right leg going to wound center changes dressing qday with santyl cream quarter size wound    Patient Active Problem List   Diagnosis Date Noted   Acute respiratory failure with hypoxia and hypercarbia ([place]) 03/23/2020   Aspiration into airway    Hyperkalemia 03/19/2020   Severe sepsis with septic shock ([place]) 03/19/2020   Acute pyelonephritis 03/19/2020   Tobacco abuse 03/19/2020   Wheezing 03/19/2020   Sepsis with acute renal failure ([place]) 03/18/2020   Ureteral stone with hydronephrosis    Anemia 12/18/2019   Iron deficiency anemia 12/18/2019   Peripheral neuropathy 10/25/2017   Chronic pain disorder 06/25/2017   Intertrigo 06/25/2017   Chronic back pain 03/19/2017   GERD (gastroesophageal reflux disease) 03/19/2017   Morbid obesity with BMI of 50.0-59.9, adult ([place]) 03/19/2017   Bacteremia due to group B Streptococcus 02/11/2017   Volume overload 02/11/2017   Acute encephalopathy 02/11/2017  Right renal mass 02/11/2017   Normocytic anemia 02/11/2017   Generalized abdominal pain    Constipation    Benign neoplasm of rectum    Nausea and vomiting    Shortness of breath 06/25/2016   CAP (community acquired pneumonia) 06/25/2016   History of recurrent UTIs 06/25/2016   Hyperlipidemia 06/25/2016   Type 2 diabetes mellitus with diabetic neuropathy, unspecified ([place]) 06/25/2016   Hypertension 06/25/2016   Asthma 06/25/2016   Polypharmacy 06/25/2016   Eosinophilia 04/23/2014    Past Surgical History:  Procedure Laterality Date   ABDOMINAL HYSTERECTOMY     1 ovary left   CARPAL TUNNEL RELEASE Right    CESAREAN SECTION     x 2   CHOLECYSTECTOMY     COLONOSCOPY WITH PROPOFOL N/A 12/03/2016    Procedure: COLONOSCOPY WITH PROPOFOL;  Surgeon: Doran Stabler, MD;  Location: [facility];  Service: Gastroenterology;  Laterality: N/A;   CYSTECTOMY     left foot   CYSTOSCOPY/URETEROSCOPY/HOLMIUM LASER/STENT PLACEMENT Left 03/18/2020   Procedure: CYSTOSCOPY/LEFT RETROGRADE PYELOGRAM, STENT PLACEMENT;  Surgeon: Ceasar Mons, MD;  Location: [facility];  Service: Urology;  Laterality: Left;   ESOPHAGOGASTRODUODENOSCOPY (EGD) WITH PROPOFOL N/A 12/03/2016   Procedure: ESOPHAGOGASTRODUODENOSCOPY (EGD) WITH PROPOFOL;  Surgeon: Doran Stabler, MD;  Location: [facility];  Service: Gastroenterology;  Laterality: N/A;   LITHOTRIPSY     MULTIPLE TOOTH EXTRACTIONS     URETEROSCOPY WITH HOLMIUM LASER LITHOTRIPSY Left 04/09/2020   Procedure: URETEROSCOPY WITH HOLMIUM LASER LITHOTRIPSY/ STENT CHANGE;  Surgeon: Festus Aloe, MD;  Location: [facility];  Service: Urology;  Laterality: Left;     OB History   No obstetric history on file.     Family History  Problem Relation Age of Onset   Cancer Mother    Cirrhosis Mother    Heart failure Father    Diabetes Father    Hypertension Father    Colon cancer Neg Hx     Social History   Tobacco Use   Smoking status: Every Day    Packs/day: 1.00    Years: 33.00    Pack years: 33.00    Types: Cigarettes    Start date: 72   Smokeless tobacco: Never  Vaping Use   Vaping Use: Never used  Substance Use Topics   Alcohol use: No    Comment: Social   Drug use: No    Home Medications Prior to Admission medications   Medication Sig Start Date End Date Taking? Authorizing Provider  acetaminophen (TYLENOL) 500 MG tablet Take 1,000 mg by mouth every 6 (six) hours as needed for mild pain.    [provider]  ALPRAZolam Duanne Moron) 1 MG tablet Take 1 mg by mouth 2 (two) times daily as needed for anxiety.     [provider]  atorvastatin (LIPITOR) 20 MG tablet TAKE 1 TABLET (20 MG TOTAL) DAILY BY MOUTH. 03/18/18   [place],  Betty G, MD  atorvastatin (LIPITOR) 40 MG tablet Take 40 mg by mouth daily. 01/09/20   [provider]  chlorpheniramine-HYDROcodone (TUSSIONEX PENNKINETIC ER) 10-8 MG/5ML SUER Take 5 mLs by mouth every 12 (twelve) hours as needed for cough. 11/10/20   Carlisle Cater, PA-C  doxycycline (VIBRAMYCIN) 100 MG capsule Take 1 capsule (100 mg total) by mouth 2 (two) times daily. 06/05/20   Davonna Belling, MD  fluticasone (FLONASE) 50 MCG/ACT nasal spray Place 1 spray into both nostrils daily. 06/18/16   [provider]  Fluticasone-Salmeterol (WIXELA INHUB) 250-50 MCG/DOSE AEPB  Inhale 1 puff into the lungs 2 (two) times daily. 12/22/18   [place], Betty G, MD  glucose blood (ONE TOUCH ULTRA TEST) test strip USE TO TEST BLOOD SUGAR 3 TIMES DAILY 06/16/18   [place], Betty G, MD  ibuprofen (ADVIL) 800 MG tablet Take 1 tablet (800 mg total) by mouth 3 (three) times daily. 03/26/20   Nita Sells, MD  insulin aspart (NOVOLOG) 100 UNIT/ML injection Inject 0-20 Units into the skin 3 (three) times daily with meals. 03/26/20   Nita Sells, MD  insulin aspart (NOVOLOG) 100 UNIT/ML injection Inject 0-5 Units into the skin at bedtime. 03/26/20   Nita Sells, MD  lactulose (CHRONULAC) 10 GM/15ML solution Take 30 mLs (20 g total) by mouth 2 (two) times daily. 03/26/20   Nita Sells, MD  lisinopril (ZESTRIL) 10 MG tablet Take 10 mg by mouth daily. 10/12/19   [provider]  metFORMIN (GLUCOPHAGE) 850 MG tablet TAKE 1 TABLET (850 MG TOTAL) BY MOUTH 2 (TWO) TIMES DAILY WITH A MEAL. 03/24/18   [place], Betty G, MD  nitrofurantoin (MACRODANTIN) 50 MG capsule Take 50 mg by mouth at bedtime. 03/20/20   [provider]  nystatin (NYSTATIN) powder Apply topically 3 (three) times daily as needed. Patient taking differently: Apply 1 application topically 3 (three) times daily as needed (chaffing). 01/17/18   [place], Betty G, MD  omeprazole (PRILOSEC) 20 MG capsule TAKE 1  CAPSULE (20 MG TOTAL) BY MOUTH DAILY BEFORE BREAKFAST. 07/03/19   [place], Betty G, MD  opium-belladonna (B&O SUPPRETTES) 16.2-60 MG suppository Place 1 suppository rectally every 6 (six) hours. 03/26/20   Nita Sells, MD  oxybutynin (DITROPAN) 5 MG tablet Take 2 tablets (10 mg total) by mouth 3 (three) times daily. 03/26/20   Nita Sells, MD  oxyCODONE (OXY IR/ROXICODONE) 5 MG immediate release tablet Take 1 tablet (5 mg total) by mouth every 8 (eight) hours as needed for severe pain. 03/26/20   Nita Sells, MD  OZEMPIC, 0.25 OR 0.5 MG/DOSE, 2 MG/1.5ML SOPN Inject 2 mg into the skin once a week. 03/05/20   [provider]  polyvinyl alcohol (LIQUIFILM TEARS) 1.4 % ophthalmic solution Place 1 drop into both eyes as needed for dry eyes.    [provider]  potassium chloride SA (KLOR-CON) 20 MEQ tablet Take 2 tablets (40 mEq total) by mouth 2 (two) times daily. 03/26/20   Nita Sells, MD  predniSONE (DELTASONE) 20 MG tablet Take 2 tablets (40 mg total) by mouth daily. Patient not taking: Reported on 02/20/2021 06/05/20   Davonna Belling, MD  pregabalin (LYRICA) 200 MG capsule TAKE 1 CAPSULE BY MOUTH THREE TIMES A DAY Patient taking differently: Take 200 mg by mouth in the morning, at noon, and at bedtime. 01/17/18   [place], Betty G, MD  tamsulosin (FLOMAX) 0.4 MG CAPS capsule Take 1 capsule (0.4 mg total) by mouth daily. 03/27/20   Nita Sells, MD  VENTOLIN HFA 108 (90 Base) MCG/ACT inhaler INHALE 2 PUFFS EVERY 6 (SIX) HOURS AS NEEDED INTO THE LUNGS FOR WHEEZING OR SHORTNESS OF BREATH. Patient taking differently: Inhale 2 puffs into the lungs every 6 (six) hours as needed for wheezing or shortness of breath. 06/02/18   [place], Betty G, MD    Allergies    Darvocet [propoxyphene n-acetaminophen], Fish allergy, Morphine and related, Penicillins, Tape, Fentanyl, and Other  Review of Systems   Review of Systems  Genitourinary:  Positive for flank  pain and hematuria.  All other systems reviewed and are negative.  Physical Exam Updated Vital Signs BP (!) 161/65 (BP Location: Right Arm)   Pulse 70   Temp 98.1 F (36.7 C) (Oral)   Resp (!) 22   Wt 128.8 kg   SpO2 96%   BMI 50.31 kg/m   Physical Exam Vitals and nursing note reviewed.  Constitutional:      General: She is not in acute distress.    Appearance: She is well-developed. She is obese.     Comments: Patient appears uncomfortable.  HENT:     Head: Atraumatic.  Eyes:     Conjunctiva/sclera: Conjunctivae normal.  Cardiovascular:     Rate and Rhythm: Normal rate and regular rhythm.     Pulses: Normal pulses.     Heart sounds: Normal heart sounds.  Pulmonary:     Effort: Pulmonary effort is normal.  Abdominal:     Palpations: Abdomen is soft.     Tenderness: There is right CVA tenderness. There is no left CVA tenderness.  Musculoskeletal:     Cervical back: Neck supple.  Skin:    Findings: No rash.  Neurological:     Mental Status: She is alert.  Psychiatric:        Mood and Affect: Mood normal.    ED Results / Procedures / Treatments   Labs (all labs ordered are listed, but only abnormal results are displayed) Labs Reviewed  CBC WITH DIFFERENTIAL/PLATELET - Abnormal; Notable for the following components:      Result Value   RDW 17.2 (*)    All other components within normal limits  URINALYSIS, ROUTINE W REFLEX MICROSCOPIC - Abnormal; Notable for the following components:   APPearance CLOUDY (*)    Hgb urine dipstick SMALL (*)    Protein, ur 30 (*)    Leukocytes,Ua LARGE (*)    WBC, UA >50 (*)    Bacteria, UA RARE (*)    All other components within normal limits  BASIC METABOLIC PANEL - Abnormal; Notable for the following components:   Glucose, Bld 212 (*)    All other components within normal limits  URINE CULTURE  LIPASE, BLOOD    EKG None  Radiology CT Renal Stone Study  Result Date: 03/02/2021 CLINICAL DATA:  Flank pain with kidney  stone suspected. EXAM: CT ABDOMEN AND PELVIS WITHOUT CONTRAST TECHNIQUE: Multidetector CT imaging of the abdomen and pelvis was performed following the standard protocol without IV contrast. COMPARISON:  11/10/2020 FINDINGS: Lower chest:  Coronary atherosclerosis Hepatobiliary: Negative for mass or adenopathy. Cholecystectomy. Pancreas: Unremarkable. Spleen: Unremarkable. Adrenals/Urinary Tract: Negative adrenals. Two tiny left lower pole renal calculi. Asymmetric left perinephric stranding. No hydronephrosis or ureteral stone. Lobulated bilateral renal cortex from scarring. Unremarkable bladder. Stomach/Bowel: No obstruction. No visible bowel inflammation. Fatty densities in the proximal small bowel which were not seen on prior and attributed to ingested material. Vascular/Lymphatic: No acute vascular abnormality. Atheromatous calcifications. No mass or adenopathy. Reproductive:Hysterectomy Other: No ascites or pneumoperitoneum. Subcutaneous reticulation to the pannus which is chronic and stable. Fatty midline hernia. Musculoskeletal: No acute abnormalities. Generalized spondylosis and disc degeneration. Endplate spurs cause multilevel lower thoracic ankylosis. IMPRESSION: 1. No hydronephrosis or ureteral stone. 2. Asymmetric left-sided perinephric stranding, upper tract infection would be considered if there is UTI. 3. Two punctate left renal calculi. 4. Chronic panniculitis. Electronically Signed   By: Monte Fantasia M.D.   On: 03/02/2021 08:46    Procedures Procedures   Medications Ordered in ED Medications  cefTRIAXone (ROCEPHIN) 1 g in sodium chloride 0.9 % 100 mL  IVPB (has no administration in time range)  fentaNYL (SUBLIMAZE) injection 50 mcg (has no administration in time range)  ondansetron (ZOFRAN) injection 4 mg (has no administration in time range)  morphine 4 MG/ML injection 4 mg (4 mg Intravenous Given 03/02/21 0823)  ondansetron (ZOFRAN) injection 4 mg (4 mg Intravenous Given 03/02/21 [id number])     ED Course  I have reviewed the triage vital signs and the nursing notes.  Pertinent labs & imaging results that were available during my care of the patient were reviewed by me and considered in my medical decision making (see chart for details).    MDM Rules/Calculators/A&P                           BP (!) 138/48   Pulse 71   Temp 98.1 F (36.7 C) (Oral)   Resp 20   Wt 128.8 kg   SpO2 96%   BMI 50.31 kg/m   Final Clinical Impression(s) / ED Diagnoses Final diagnoses:  Pyelonephritis    Rx / DC Orders ED Discharge Orders     None      8:04 AM Patient here with right flank pain and hematuria.  Significant history of recurrent kidney stone.  She appears in Some discomfort.  Will obtain CT renal stone study, check UA and labs and provide symptomatic treatment.  8:54 AM CT renal stone study without any evidence of ureteral stones or hydronephrosis.  There is asymmetric left-sided perinephric stranding and therefore upper tract infection could be considered if there is evidence of UTI however patient without any significant left-sided pain.  She is currently on ciprofloxacin for UTI.  10:18 AM UA obtained today shows cloudy appearance large leukocyte esterase with greater than 50 WBC.  This is likely suggestive of an underlying urinary tract infection that may have led to pyelonephritis.  Normal WBC, patient is afebrile.  10:27 AM I discussed care with patient in the setting of progressive worsening pain, underlying infection, patient endorsed nausea and she did try outpatient antibiotic without relief plan to consult for admission.  Urine culture sent.  Care discussed with Dr. Francia Greaves.    10:40 AM Appreciate consultation from [facility] Dr. Marylyn Ishihara who agrees to admit pt but request consultation to urologist to check up on urine culture sensitivity as it will help determine abx choice.    11:21 AM Appreciate the help of urologist Dr. Leilani Merl who have informed me  that her urine culture the office on 7/20 grows less then 10,000 colony of yeast.,  Past cultures have grown group B strep.  Her previous cultures shows multidrug-resistant E. coli that is sensitive to Cipro, ceftriaxone, and gentamicin.   Domenic Moras, PA-C 03/02/21 1130    Valarie Merino, MD 03/02/21 [id number]

## 2021-03-02 NOTE — H&P (Signed)
History and Physical    Gina [name] [id number] DOB: 1966/01/26 DOA: 03/02/2021  PCP: Imagene Riches, NP  Patient coming from: Home  Chief Complaint: right flank pain  HPI: Gina [name] is a 55 y.o. female with medical history significant of HTN, HLD, DM, anxiety. Presenting with flank pain. Symptoms started 8 days ago. She was cooking. She bent over and felt right flank pain that radiated to her groin. It continued through the weekend, so she went to her urologist 6 days ago. An [place] was negative. UCx was collected. She was given abx, nausea meds, and pain meds. 2 days later, she was informed that her UCx was negative per her report. However, it was recommended that she complete the course of abx. She has become diaphoretic w/ continued nausea and pain since that time. This morning, her symptoms were still present, so she decided to come to the ED. She denies any other aggravating or alleviating factors.   ED Course: CT renal study positive for left perinephric inflammation. UA suggestive of UTI. She was started on rocephin. [facility] was called for admisison.   Review of Systems:  Denies CP, dyspnea, palpitations, V/D, fevers. Review of systems is otherwise negative for all not mentioned in HPI.   PMHx Past Medical History:  Diagnosis Date   Anemia    Anxiety    Arthritis    Asthma    Back pain    Chronic pain    Diabetes mellitus    type 2   Dyspnea    with exersion    Family history of adverse reaction to anesthesia    youngest daughter hard to wake up both daughters post op n/v   Fibromyalgia    GERD (gastroesophageal reflux disease)    Headache    migraines   History of kidney stones    2 big stones in left kidney now   History of recurrent UTIs    last uti 3 weeks ago   Hyperlipidemia    Hypertension    IBS (irritable bowel syndrome)    Obesities, morbid (HCC)    Pneumonia 06/2016   PONV (postoperative nausea and vomiting) 2004   came back after hystectomy  bowels locked up on life support for 1 month    Sinusitis    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 at [place]   Umbilical hernia    Wound discharge    open wound right leg going to wound center changes dressing qday with santyl cream quarter size wound    PSHx Past Surgical History:  Procedure Laterality Date   ABDOMINAL HYSTERECTOMY     1 ovary left   CARPAL TUNNEL RELEASE Right    CESAREAN SECTION     x 2   CHOLECYSTECTOMY     COLONOSCOPY WITH PROPOFOL N/A 12/03/2016   Procedure: COLONOSCOPY WITH PROPOFOL;  Surgeon: Doran Stabler, MD;  Location: [facility];  Service: Gastroenterology;  Laterality: N/A;   CYSTECTOMY     left foot   CYSTOSCOPY/URETEROSCOPY/HOLMIUM LASER/STENT PLACEMENT Left 03/18/2020   Procedure: CYSTOSCOPY/LEFT RETROGRADE PYELOGRAM, STENT PLACEMENT;  Surgeon: Ceasar Mons, MD;  Location: [facility];  Service: Urology;  Laterality: Left;   ESOPHAGOGASTRODUODENOSCOPY (EGD) WITH PROPOFOL N/A 12/03/2016   Procedure: ESOPHAGOGASTRODUODENOSCOPY (EGD) WITH PROPOFOL;  Surgeon: Doran Stabler, MD;  Location: [facility];  Service: Gastroenterology;  Laterality: N/A;   LITHOTRIPSY     MULTIPLE TOOTH EXTRACTIONS     URETEROSCOPY WITH  HOLMIUM LASER LITHOTRIPSY Left 04/09/2020   Procedure: URETEROSCOPY WITH HOLMIUM LASER LITHOTRIPSY/ STENT CHANGE;  Surgeon: Festus Aloe, MD;  Location: [facility];  Service: Urology;  Laterality: Left;    SocHx  reports that she has been smoking cigarettes. She started smoking about 37 years ago. She has a 33.00 pack-year smoking history. She has never used smokeless tobacco. She reports that she does not drink alcohol and does not use drugs.  Allergies  Allergen Reactions   Darvocet [Propoxyphene N-Acetaminophen] Nausea And Vomiting   Fish Allergy Anaphylaxis   Morphine And Related Itching   Penicillins Hives and Swelling    Tolerates Rocephin  Swelling all over body  Has patient had a PCN reaction causing  immediate rash, facial/tongue/throat swelling, SOB or lightheadedness with hypotension: Yes Has patient had a PCN reaction causing severe rash involving mucus membranes or skin necrosis: Yes Has patient had a PCN reaction that required hospitalization Unknown Has patient had a PCN reaction occurring within the last 10 years: No  If all of the above answers are "NO", then may proceed with Cephalosporin use.    Tape Hives    Adhesive tape   Fentanyl Nausea Only   Other     Bee venom-anaphylaxis     FamHx Family History  Problem Relation Age of Onset   Cancer Mother    Cirrhosis Mother    Heart failure Father    Diabetes Father    Hypertension Father    Colon cancer Neg Hx     Prior to Admission medications   Medication Sig Start Date End Date Taking? Authorizing Provider  acetaminophen (TYLENOL) 500 MG tablet Take 1,000 mg by mouth every 6 (six) hours as needed for mild pain.    [provider]  ALPRAZolam Duanne Moron) 1 MG tablet Take 1 mg by mouth 2 (two) times daily as needed for anxiety.     [provider]  atorvastatin (LIPITOR) 20 MG tablet TAKE 1 TABLET (20 MG TOTAL) DAILY BY MOUTH. 03/18/18   [place], Betty G, MD  atorvastatin (LIPITOR) 40 MG tablet Take 40 mg by mouth daily. 01/09/20   [provider]  chlorpheniramine-HYDROcodone (TUSSIONEX PENNKINETIC ER) 10-8 MG/5ML SUER Take 5 mLs by mouth every 12 (twelve) hours as needed for cough. 11/10/20   Carlisle Cater, PA-C  cyclobenzaprine (FLEXERIL) 10 MG tablet Take 10 mg by mouth daily as needed for muscle spasms. 12/05/20   [provider]  doxycycline (VIBRAMYCIN) 100 MG capsule Take 1 capsule (100 mg total) by mouth 2 (two) times daily. 06/05/20   Davonna Belling, MD  fluticasone (FLONASE) 50 MCG/ACT nasal spray Place 1 spray into both nostrils daily. 06/18/16   [provider]  Fluticasone-Salmeterol (WIXELA INHUB) 250-50 MCG/DOSE AEPB Inhale 1 puff into the lungs 2 (two) times daily.  12/22/18   [place], Betty G, MD  glucose blood (ONE TOUCH ULTRA TEST) test strip USE TO TEST BLOOD SUGAR 3 TIMES DAILY 06/16/18   [place], Betty G, MD  HYDROcodone-acetaminophen (NORCO/VICODIN) 5-325 MG tablet Take 1 tablet by mouth every 6 (six) hours as needed for severe pain. 02/26/21   [provider]  ibuprofen (ADVIL) 800 MG tablet Take 1 tablet (800 mg total) by mouth 3 (three) times daily. 03/26/20   Nita Sells, MD  insulin aspart (NOVOLOG) 100 UNIT/ML injection Inject 0-20 Units into the skin 3 (three) times daily with meals. 03/26/20   Nita Sells, MD  insulin aspart (NOVOLOG) 100 UNIT/ML injection Inject 0-5 Units into the skin at  bedtime. 03/26/20   Nita Sells, MD  lactulose (CHRONULAC) 10 GM/15ML solution Take 30 mLs (20 g total) by mouth 2 (two) times daily. 03/26/20   Nita Sells, MD  lisinopril (ZESTRIL) 10 MG tablet Take 10 mg by mouth daily. 10/12/19   [provider]  metFORMIN (GLUCOPHAGE) 850 MG tablet TAKE 1 TABLET (850 MG TOTAL) BY MOUTH 2 (TWO) TIMES DAILY WITH A MEAL. 03/24/18   [place], Betty G, MD  montelukast (SINGULAIR) 10 MG tablet Take 10 mg by mouth every evening. 12/06/20   [provider]  nitrofurantoin (MACRODANTIN) 50 MG capsule Take 50 mg by mouth at bedtime. 03/20/20   [provider]  nystatin (NYSTATIN) powder Apply topically 3 (three) times daily as needed. Patient taking differently: Apply 1 application topically 3 (three) times daily as needed (chaffing). 01/17/18   [place], Betty G, MD  omeprazole (PRILOSEC) 20 MG capsule TAKE 1 CAPSULE (20 MG TOTAL) BY MOUTH DAILY BEFORE BREAKFAST. 07/03/19   [place], Betty G, MD  opium-belladonna (B&O SUPPRETTES) 16.2-60 MG suppository Place 1 suppository rectally every 6 (six) hours. 03/26/20   Nita Sells, MD  oxybutynin (DITROPAN) 5 MG tablet Take 2 tablets (10 mg total) by mouth 3 (three) times daily. 03/26/20   Nita Sells, MD  oxyCODONE (OXY  IR/ROXICODONE) 5 MG immediate release tablet Take 1 tablet (5 mg total) by mouth every 8 (eight) hours as needed for severe pain. 03/26/20   Nita Sells, MD  OZEMPIC, 0.25 OR 0.5 MG/DOSE, 2 MG/1.5ML SOPN Inject 2 mg into the skin once a week. 03/05/20   [provider]  polyvinyl alcohol (LIQUIFILM TEARS) 1.4 % ophthalmic solution Place 1 drop into both eyes as needed for dry eyes.    [provider]  potassium chloride SA (KLOR-CON) 20 MEQ tablet Take 2 tablets (40 mEq total) by mouth 2 (two) times daily. 03/26/20   Nita Sells, MD  predniSONE (DELTASONE) 20 MG tablet Take 2 tablets (40 mg total) by mouth daily. Patient not taking: Reported on 02/20/2021 06/05/20   Davonna Belling, MD  pregabalin (LYRICA) 200 MG capsule TAKE 1 CAPSULE BY MOUTH THREE TIMES A DAY Patient taking differently: Take 200 mg by mouth in the morning, at noon, and at bedtime. 01/17/18   [place], Betty G, MD  promethazine (PHENERGAN) 12.5 MG tablet Take 12.5 mg by mouth every 8 (eight) hours as needed for nausea/vomiting. 02/26/21   [provider]  sulfamethoxazole-trimethoprim (BACTRIM DS) 800-160 MG tablet Take 1 tablet by mouth 2 (two) times daily. 02/26/21   [provider]  tamsulosin (FLOMAX) 0.4 MG CAPS capsule Take 1 capsule (0.4 mg total) by mouth daily. 03/27/20   Nita Sells, MD  VENTOLIN HFA 108 (90 Base) MCG/ACT inhaler INHALE 2 PUFFS EVERY 6 (SIX) HOURS AS NEEDED INTO THE LUNGS FOR WHEEZING OR SHORTNESS OF BREATH. Patient taking differently: Inhale 2 puffs into the lungs every 6 (six) hours as needed for wheezing or shortness of breath. 06/02/18   [place], Betty G, MD    Physical Exam: Vitals:   03/02/21 0737 03/02/21 0910 03/02/21 1000  BP: (!) 161/65 (!) 138/48 (!) 138/48  Pulse: 70 71 71  Resp: (!) '22 20 20  '$ Temp: 98.1 F (36.7 C)    TempSrc: Oral    SpO2: 96% 96% 96%  Weight: 128.8 kg      General: 55 y.o. female resting in bed in NAD Eyes:  PERRL, normal sclera ENMT: Nares patent w/o discharge, orophaynx clear, dentition normal, ears w/o discharge/lesions/ulcers  Neck: Supple, trachea midline Cardiovascular: RRR, +S1, S2, no m/g/r, equal pulses throughout Respiratory: CTABL, no w/r/r, normal WOB GI: BS+, NDNT, no masses noted, no organomegaly noted MSK: No e/c/c, b/l CVAT Skin: No rashes, bruises, ulcerations noted Neuro: A&O x 3, no focal deficits Psyc: Appropriate interaction and affect, calm/cooperative  Labs on Admission: I have personally reviewed following labs and imaging studies  CBC: Recent Labs  Lab 03/02/21 0801  WBC 5.6  NEUTROABS 3.8  HGB 12.6  HCT 40.7  MCV 89.5  PLT 0000000   Basic Metabolic Panel: Recent Labs  Lab 03/02/21 0801  NA 138  K 4.8  CL 106  CO2 24  GLUCOSE 212*  BUN 13  CREATININE 0.95  CALCIUM 9.1   GFR: Estimated Creatinine Clearance: 86.6 mL/min (by C-G formula based on SCr of 0.95 mg/dL). Liver Function Tests: No results for input(s): AST, ALT, ALKPHOS, BILITOT, PROT, ALBUMIN in the last 168 hours. Recent Labs  Lab 03/02/21 0801  LIPASE 28   No results for input(s): AMMONIA in the last 168 hours. Coagulation Profile: No results for input(s): INR, PROTIME in the last 168 hours. Cardiac Enzymes: No results for input(s): CKTOTAL, CKMB, CKMBINDEX, TROPONINI in the last 168 hours. BNP (last 3 results) No results for input(s): PROBNP in the last 8760 hours. HbA1C: No results for input(s): HGBA1C in the last 72 hours. CBG: No results for input(s): GLUCAP in the last 168 hours. Lipid Profile: No results for input(s): CHOL, HDL, LDLCALC, TRIG, CHOLHDL, LDLDIRECT in the last 72 hours. Thyroid Function Tests: No results for input(s): TSH, T4TOTAL, FREET4, T3FREE, THYROIDAB in the last 72 hours. Anemia Panel: No results for input(s): VITAMINB12, FOLATE, FERRITIN, TIBC, IRON, RETICCTPCT in the last 72 hours. Urine analysis:    Component Value Date/Time   COLORURINE YELLOW  03/02/2021 0907   APPEARANCEUR CLOUDY (A) 03/02/2021 0907   LABSPEC 1.011 03/02/2021 0907   PHURINE 6.0 03/02/2021 0907   GLUCOSEU NEGATIVE 03/02/2021 0907   GLUCOSEU NEGATIVE 03/19/2017 0827   HGBUR SMALL (A) 03/02/2021 0907   BILIRUBINUR NEGATIVE 03/02/2021 0907   BILIRUBINUR negative 01/17/2018 1227   KETONESUR NEGATIVE 03/02/2021 0907   PROTEINUR 30 (A) 03/02/2021 0907   UROBILINOGEN 0.2 01/17/2018 1227   UROBILINOGEN 0.2 03/19/2017 0827   NITRITE NEGATIVE 03/02/2021 0907   LEUKOCYTESUR LARGE (A) 03/02/2021 0907    Radiological Exams on Admission: CT Renal Stone Study  Result Date: 03/02/2021 CLINICAL DATA:  Flank pain with kidney stone suspected. EXAM: CT ABDOMEN AND PELVIS WITHOUT CONTRAST TECHNIQUE: Multidetector CT imaging of the abdomen and pelvis was performed following the standard protocol without IV contrast. COMPARISON:  11/10/2020 FINDINGS: Lower chest:  Coronary atherosclerosis Hepatobiliary: Negative for mass or adenopathy. Cholecystectomy. Pancreas: Unremarkable. Spleen: Unremarkable. Adrenals/Urinary Tract: Negative adrenals. Two tiny left lower pole renal calculi. Asymmetric left perinephric stranding. No hydronephrosis or ureteral stone. Lobulated bilateral renal cortex from scarring. Unremarkable bladder. Stomach/Bowel: No obstruction. No visible bowel inflammation. Fatty densities in the proximal small bowel which were not seen on prior and attributed to ingested material. Vascular/Lymphatic: No acute vascular abnormality. Atheromatous calcifications. No mass or adenopathy. Reproductive:Hysterectomy Other: No ascites or pneumoperitoneum. Subcutaneous reticulation to the pannus which is chronic and stable. Fatty midline hernia. Musculoskeletal: No acute abnormalities. Generalized spondylosis and disc degeneration. Endplate spurs cause multilevel lower thoracic ankylosis. IMPRESSION: 1. No hydronephrosis or ureteral stone. 2. Asymmetric left-sided perinephric stranding, upper  tract infection would be considered if there is UTI. 3. Two punctate left renal calculi. 4. Chronic panniculitis.  Electronically Signed   By: Monte Fantasia M.D.   On: 03/02/2021 08:46    EKG: None obtained in ED  Assessment/Plan Pyleonephritis Flank pain     - admit to inpt, med-surg     - CT positive for left perinephric inflammation suggestive of pyelo     - rocephin 1 g given in ED, increase to 2 g daily     - let's get [facility] records     - strange that nothing is showing on right, but she has physical findings b/l     - compazine, toradol, morphine, fluids  DM2     - DM diet, A1c, glucose checks, SSI  HLD     - resume home statin  HTN     - resume home regimen  GERD     - PPI  Anxiety     - resume home regimen  Tobacco abuse     - counsel against further use     - nicotine patch  Morbid obesity     - counsel on diet and lifestyle changes; outpt follow up  DVT prophylaxis: lovenox  Code Status: FULL  Family Communication: None at bedside  Consults called: None   Status is: Inpatient  Remains inpatient appropriate because:Inpatient level of care appropriate due to severity of illness  Dispo: The patient is from: Home              Anticipated d/c is to: Home              Patient currently is not medically stable to d/c.   Difficult to place patient No  Jonnie Finner DO [facility]  If 7PM-7AM, please contact night-coverage [email or web address]  03/02/2021, 10:47 AM

## 2021-03-02 NOTE — ED Triage Notes (Signed)
Pt c/o R flank pain radiating into RLQ and hematuria x 1 week.  Pain score 9/10.  Hx of kidney stones.  Pt was seen by Urologist and told to come to ED if symptoms got worse.   Pt is being treated for a UTI as well.

## 2021-03-02 NOTE — ED Notes (Signed)
Patients oxygen saturation dropped to 80% room air after pain medication administration. RN placed 2L [place] on patient. Saturation increased to 96% on 2L [place].

## 2021-03-02 NOTE — Telephone Encounter (Signed)
This is a patient who is being admitted for treatment of pyelonephritis.  I was contacted by [name] emergency room regarding her culture data since she is seen in our outpatient urology practice.  She does have a urine culture collected from 02/26/2021 which grew less than 10K colonies of yeast.  While her current urine culture is pending it may be reasonable to put her on an antifungal.  Otherwise past cultures have grown group B strep.  She has also grown on urine culture is available in epic a multidrug-resistant E. coli which is sensitive to ceftriaxone, ciprofloxacin, gentamicin.  In the past she has also grown Enterococcus which was sensitive to ampicillin and vancomycin.  I hope that this information is helpful to the emergency room physicians.  We are available if any help is needed.  Of note she does not have any hydronephrosis on her CT scan.

## 2021-03-03 ENCOUNTER — Encounter (HOSPITAL_COMMUNITY): Payer: Self-pay | Admitting: Internal Medicine

## 2021-03-03 DIAGNOSIS — M545 Low back pain, unspecified: Secondary | ICD-10-CM

## 2021-03-03 DIAGNOSIS — N12 Tubulo-interstitial nephritis, not specified as acute or chronic: Secondary | ICD-10-CM

## 2021-03-03 DIAGNOSIS — G894 Chronic pain syndrome: Secondary | ICD-10-CM

## 2021-03-03 DIAGNOSIS — J9601 Acute respiratory failure with hypoxia: Secondary | ICD-10-CM

## 2021-03-03 DIAGNOSIS — Z9189 Other specified personal risk factors, not elsewhere classified: Secondary | ICD-10-CM

## 2021-03-03 DIAGNOSIS — E1165 Type 2 diabetes mellitus with hyperglycemia: Secondary | ICD-10-CM

## 2021-03-03 LAB — CBC
HCT: 40.8 % (ref 36.0–46.0)
Hemoglobin: 12.5 g/dL (ref 12.0–15.0)
MCH: 27.8 pg (ref 26.0–34.0)
MCHC: 30.6 g/dL (ref 30.0–36.0)
MCV: 90.7 fL (ref 80.0–100.0)
Platelets: 206 10*3/uL (ref 150–400)
RBC: 4.5 MIL/uL (ref 3.87–5.11)
RDW: 17.2 % — ABNORMAL HIGH (ref 11.5–15.5)
WBC: 7.4 10*3/uL (ref 4.0–10.5)
nRBC: 0 % (ref 0.0–0.2)

## 2021-03-03 LAB — URINE CULTURE

## 2021-03-03 LAB — COMPREHENSIVE METABOLIC PANEL
ALT: 16 U/L (ref 0–44)
AST: 19 U/L (ref 15–41)
Albumin: 3.5 g/dL (ref 3.5–5.0)
Alkaline Phosphatase: 66 U/L (ref 38–126)
Anion gap: 6 (ref 5–15)
BUN: 11 mg/dL (ref 6–20)
CO2: 27 mmol/L (ref 22–32)
Calcium: 9.2 mg/dL (ref 8.9–10.3)
Chloride: 104 mmol/L (ref 98–111)
Creatinine, Ser: 0.85 mg/dL (ref 0.44–1.00)
GFR, Estimated: >60 mL/min (ref >60)
Glucose, Bld: 145 mg/dL — ABNORMAL HIGH (ref 70–99)
Potassium: 4.9 mmol/L (ref 3.5–5.1)
Sodium: 137 mmol/L (ref 135–145)
Total Bilirubin: 0.6 mg/dL (ref 0.3–1.2)
Total Protein: 6.4 g/dL — ABNORMAL LOW (ref 6.5–8.1)

## 2021-03-03 LAB — GLUCOSE, CAPILLARY
Glucose-Capillary: 188 mg/dL — ABNORMAL HIGH (ref 70–99)
Glucose-Capillary: 180 mg/dL — ABNORMAL HIGH (ref 70–99)
Glucose-Capillary: 206 mg/dL — ABNORMAL HIGH (ref 70–99)
Glucose-Capillary: 151 mg/dL — ABNORMAL HIGH (ref 70–99)

## 2021-03-03 MED ORDER — PREGABALIN 100 MG PO CAPS
100.0000 mg | ORAL_CAPSULE | Freq: Three times a day (TID) | ORAL | Status: DC
Start: 2021-03-03 — End: 2021-03-04
  Administered 2021-03-03 – 2021-03-04 (×3): 100 mg via ORAL
  Filled 2021-03-03 (×3): qty 1

## 2021-03-03 MED ORDER — ATORVASTATIN CALCIUM 40 MG PO TABS
80.0000 mg | ORAL_TABLET | Freq: Every day | ORAL | Status: DC
  Administered 2021-03-03: 80 mg via ORAL
  Filled 2021-03-03: qty 2

## 2021-03-03 MED ORDER — INSULIN ASPART 100 UNIT/ML IJ SOLN
6.0000 [IU] | Freq: Three times a day (TID) | INTRAMUSCULAR | Status: DC
  Administered 2021-03-03 – 2021-03-04 (×2): 6 [IU] via SUBCUTANEOUS

## 2021-03-03 MED ORDER — MONTELUKAST SODIUM 10 MG PO TABS
10.0000 mg | ORAL_TABLET | Freq: Every evening | ORAL | Status: DC
  Administered 2021-03-03: 10 mg via ORAL
  Filled 2021-03-03: qty 1

## 2021-03-03 MED ORDER — INSULIN ASPART 100 UNIT/ML IJ SOLN
0.0000 [IU] | Freq: Three times a day (TID) | INTRAMUSCULAR | Status: DC
  Administered 2021-03-03: 7 [IU] via SUBCUTANEOUS
  Administered 2021-03-04 (×2): 4 [IU] via SUBCUTANEOUS

## 2021-03-03 MED ORDER — GLUCERNA SHAKE PO LIQD
237.0000 mL | ORAL | Status: DC
  Administered 2021-03-03 – 2021-03-04 (×2): 237 mL via ORAL
  Filled 2021-03-03 (×2): qty 237

## 2021-03-03 MED ORDER — HYDROCODONE-ACETAMINOPHEN 5-325 MG PO TABS
1.0000 | ORAL_TABLET | Freq: Four times a day (QID) | ORAL | Status: DC | PRN
  Administered 2021-03-03 – 2021-03-04 (×4): 1 via ORAL
  Filled 2021-03-03 (×4): qty 1

## 2021-03-03 MED ORDER — ALPRAZOLAM 0.5 MG PO TABS: 0.5000 mg | ORAL_TABLET | Freq: Two times a day (BID) | ORAL | Status: DC | PRN

## 2021-03-03 MED ORDER — HYDROMORPHONE HCL 1 MG/ML IJ SOLN
0.5000 mg | INTRAMUSCULAR | Status: AC | PRN
  Administered 2021-03-03: 0.5 mg via INTRAVENOUS
  Filled 2021-03-03: qty 0.5

## 2021-03-03 MED ORDER — ADULT MULTIVITAMIN W/MINERALS CH
1.0000 | ORAL_TABLET | Freq: Every day | ORAL | Status: DC
  Administered 2021-03-03 – 2021-03-04 (×2): 1 via ORAL
  Filled 2021-03-03 (×2): qty 1

## 2021-03-03 MED ORDER — INSULIN ASPART 100 UNIT/ML IJ SOLN: 0.0000 [IU] | Freq: Every day | INTRAMUSCULAR | Status: DC

## 2021-03-03 MED ORDER — MOMETASONE FURO-FORMOTEROL FUM 200-5 MCG/ACT IN AERO
2.0000 | INHALATION_SPRAY | Freq: Two times a day (BID) | RESPIRATORY_TRACT | Status: DC
  Administered 2021-03-04: 2 via RESPIRATORY_TRACT
  Filled 2021-03-03: qty 8.8

## 2021-03-03 MED ORDER — ENSURE MAX PROTEIN PO LIQD
11.0000 [oz_av] | Freq: Every day | ORAL | Status: DC
  Administered 2021-03-04: 11 [oz_av] via ORAL
  Filled 2021-03-03: qty 330

## 2021-03-03 MED ORDER — PROSOURCE PLUS PO LIQD
30.0000 mL | Freq: Two times a day (BID) | ORAL | Status: DC
  Administered 2021-03-03: 30 mL via ORAL
  Filled 2021-03-03 (×2): qty 30

## 2021-03-03 MED ORDER — LIDOCAINE 5 % EX PTCH
1.0000 | MEDICATED_PATCH | CUTANEOUS | Status: DC
  Administered 2021-03-03 – 2021-03-04 (×2): 1 via TRANSDERMAL
  Filled 2021-03-03 (×3): qty 1

## 2021-03-03 NOTE — Progress Notes (Signed)
Initial Nutrition Assessment  DOCUMENTATION CODES:   Morbid obesity  INTERVENTION:  - will order Ensure Max once/day, each supplement provides 150 kcal and 30 grams protein. - will order Glucerna Shake once/day, each supplement provides 220 kcal and 10 grams of protein. - will order 30 ml Prosource Plus BID, each supplement provides 100 kcal and 15 grams protein.  - will order 1 tablet multivitamin with minerals/day.   NUTRITION DIAGNOSIS:   Inadequate oral intake related to acute illness as evidenced by per patient/family report.  GOAL:   Patient will meet greater than or equal to 90% of their needs  MONITOR:   PO intake, Supplement acceptance, Labs, Weight trends, I & O's  REASON FOR ASSESSMENT:   Malnutrition Screening Tool    ASSESSMENT:   55 year old female with medical history of HTN, HLD, type 2 DM, anxiety, morbid obesity, chronic pain, tobacco use, and asthma. She presented to the ED with R flank pain x8 days. CT in the ED was concerning for L pyelonephritis. She was started on IV abx.  She ate 100% of breakfast this AM (386 kcal and 9 grams protein). She did not have any nausea during or after the meal but continued to have R-sided pain that did not seem to worsen during or after the meal.   R-sided pain has been ongoing for the past 1 week and has intermittently worsened during that time. Due to pain, she was not interested in eating but is unsure if eating worsened pain; drinking did not worsen pain.   Weight yesterday was 284 lb, weight at [place] on 6/9 was 283 lb, and weight within [facility] on 04/03/20 was 290 lb. This indicates 6 lb weight loss (2% body weight) in unknown time frame.   Per notes:  - acute L-sided pyelonephritis - hx of chronic pain syndrome - acute respiratory failure with hypoxia and desat into the 80s overnight - hx of uncontrolled DM--pending HgbA1c - from home with likely plan to return home at the time of d/c   Labs reviewed;  CBGs: 188 and 180 mg/dl. Medications reviewed; sliding scale novolog, 6 units novolog TID. IVF; NS @ 100 ml/hr.    NUTRITION - FOCUSED PHYSICAL EXAM:  No muscle or fat depletions, mild edema to all extremities.   Diet Order:   Diet Order             Diet Carb Modified Fluid consistency: Thin; Room service appropriate? Yes  Diet effective now                   EDUCATION NEEDS:   No education needs have been identified at this time  Skin:  Skin Assessment: Reviewed RN Assessment  Last BM:  PTA/unknown  Height:   Ht Readings from Last 1 Encounters:  03/02/21 '5\' 3"'$  (1.6 m)    Weight:   Wt Readings from Last 1 Encounters:  03/02/21 128.8 kg     Estimated Nutritional Needs:  Kcal:  2000-2250 kcal Protein:  105-120 grams Fluid:  >/= 2.2 L/day       Jarome Matin, MS, RD, LDN, CNSC Inpatient Clinical Dietitian RD pager # available in [facility]  After hours/weekend pager # available in [facility]

## 2021-03-03 NOTE — Plan of Care (Signed)

## 2021-03-03 NOTE — Progress Notes (Signed)
PROGRESS NOTE  Gina [name] [id number] DOB: [birth year]   PCP: Imagene Riches, NP  Patient is from: Home.  DOA: 03/02/2021 LOS: 1  Chief complaints:  Chief Complaint  Patient presents with   Flank Pain   Hematuria     Brief Narrative / Interim history: 55 year old F with PMH of HTN, HLD, DM-2, anxiety, morbid obesity, chronic pain, tobacco use and asthma presenting with right flank pain for 8 days.  She was seen by urologist 6 days POA and had urine culture that showed some yeast.  She was empirically treated with oral antibiotics for possible UTI but she became diaphoretic with nausea and pain which prompted her to come to ED.  In ED, afebrile.  No leukocytosis.  UA with large LE and small Hgb and rare bacteria.  CT renal stone study concerning for left pyelonephritis.  Urine cultures with multiple species.  She is on IV ceftriaxone.  Subjective: Seen and examined earlier this morning.  She desaturated to 80s on RA and started on 2 L.  She continues to endorse right-sided back pain and flank pain.  No nausea or vomiting.  No fever, dysuria or frequency.  Denies chest pain or dyspnea.  Objective: Vitals:   03/02/21 2346 03/02/21 2349 03/03/21 0622 03/03/21 1251  BP: (!) 156/67  (!) 117/49 (!) 118/49  Pulse: 86  78 70  Resp: '19  14 18  '$ Temp: 99.9 F (37.7 C)  100.1 F (37.8 C) 98.6 F (37 C)  TempSrc: Oral  Oral Oral  SpO2: (!) 87% 97% 94% (!) 89%  Weight:  128.8 kg    Height:  '5\' 3"'$  (1.6 m)      Intake/Output Summary (Last 24 hours) at 03/03/2021 1259 Last data filed at 03/03/2021 1056 Gross per 24 hour  Intake 1455.46 ml  Output --  Net 1455.46 ml   Filed Weights   03/02/21 0737 03/02/21 2349  Weight: 128.8 kg 128.8 kg    Examination:  GENERAL: No apparent distress.  Nontoxic. HEENT: MMM.  Vision and hearing grossly intact.  NECK: Supple.  No apparent JVD.  RESP: 94% on 2 L.  No IWOB.  Fair aeration bilaterally. CVS:  RRR. Heart sounds normal.   ABD/GI/GU: BS+. Abd soft, NTND.  MSK/EXT:  Moves extremities.  Tenderness over right lumbosacral paraspinal muscles SKIN: no apparent skin lesion or wound NEURO: Awake, alert and oriented appropriately.  No apparent focal neuro deficit. PSYCH: Calm. Normal affect.   Procedures:  None  Microbiology summarized: [id number] and influenza PCR nonreactive. Urine culture with multiple species. Repeat urine culture pending  Assessment & Plan: Acute left Pyleonephritis-patient presents with right-sided back pain and flank pain, nausea and diaphoresis.  No leukocytosis or fever here.  Initial urine culture with multiple species.  No history of ESBL. -Continue IV ceftriaxone -Repeat urine culture  Right lower back/flank pain-this looks musculoskeletal on exam. Chronic pain syndrome -Decrease IV Dilaudid.  Resume home Norco.  Continue IV Toradol and Robaxin -Resume home Lyrica at lower dose.  Add topical Lidoderm -OOB/PT/OT  Acute respiratory failure with hypoxia-desaturated to lower 80s about midnight after pain medication but recovered to mid 90s on 2 L. OSA?  [facility]?Marland Kitchen  She says his sleep study was negative. -Wean oxygen as able -Incentive spirometry, OOB/PT/OT   Uncontrolled NIDDM-2 with hyperglycemia, hyperlipidemia and neuropathy Recent Labs  Lab 03/02/21 2135 03/03/21 0728 03/03/21 1201  GLUCAP 142* 188* 180*  -Continue SSI-high -Add NovoLog 6 units 3 times daily with meals -Resume home started  and Lyrica -Check hemoglobin A1c  Essential hypertension: Soft blood pressures -Continue IV fluid -Hold home lisinopril   GERD -Continue PPI   Anxiety -Resume home Xanax at reduced dose   Tobacco use disorder -Encourage cessation -Nicotine patch  At risk for polypharmacy: On multiple sedating medication for pain and anxiety.  Morbid obesity Body mass index is 50.3 kg/m.  -Continue Ozempic on discharge -Encourage lifestyle change to lose weight.       DVT prophylaxis:     On subcu Lovenox  Code Status: Full code Family Communication: Patient and/or RN.  Updated patient's husband at bedside Level of care: Med-Surg Status is: Inpatient  Remains inpatient appropriate because:Ongoing active pain requiring inpatient pain management, IV treatments appropriate due to intensity of illness or inability to take PO, and Inpatient level of care appropriate due to severity of illness  Dispo: The patient is from: Home              Anticipated d/c is to: Home              Patient currently is not medically stable to d/c.   Difficult to place patient No       Consultants:  None   Sch Meds:  Scheduled Meds:  atorvastatin  80 mg Oral QHS   enoxaparin (LOVENOX) injection  60 mg Subcutaneous Q24H   insulin aspart  0-20 Units Subcutaneous TID WC   insulin aspart  0-5 Units Subcutaneous QHS   insulin aspart  6 Units Subcutaneous TID WC   lidocaine  1 patch Transdermal Q24H   mometasone-formoterol  2 puff Inhalation BID   montelukast  10 mg Oral QPM   pantoprazole  40 mg Oral Daily   pregabalin  100 mg Oral TID   Continuous Infusions:  sodium chloride 100 mL/hr at 03/03/21 0845   cefTRIAXone (ROCEPHIN)  IV 2 g (03/03/21 0944)   methocarbamol (ROBAXIN) IV     PRN Meds:.acetaminophen **OR** acetaminophen, ALPRAZolam, diphenhydrAMINE, hydrALAZINE, HYDROcodone-acetaminophen, HYDROmorphone (DILAUDID) injection, ketorolac, methocarbamol (ROBAXIN) IV, prochlorperazine  Antimicrobials: Anti-infectives (From admission, onward)    Start     Dose/Rate Route Frequency Ordered Stop   03/03/21 1000  cefTRIAXone (ROCEPHIN) 2 g in sodium chloride 0.9 % 100 mL IVPB        2 g 200 mL/hr over 30 Minutes Intravenous Every 24 hours 03/02/21 1110     03/02/21 1115  cefTRIAXone (ROCEPHIN) 1 g in sodium chloride 0.9 % 100 mL IVPB        1 g 200 mL/hr over 30 Minutes Intravenous  Once 03/02/21 1110 03/02/21 1229   03/02/21 1030  cefTRIAXone (ROCEPHIN) 1 g in sodium chloride 0.9  % 100 mL IVPB        1 g 200 mL/hr over 30 Minutes Intravenous  Once 03/02/21 1017 03/02/21 1148        I have personally reviewed the following labs and images: CBC: Recent Labs  Lab 03/02/21 0801 03/02/21 2145 03/03/21 0308  WBC 5.6 7.7 7.4  NEUTROABS 3.8  --   --   HGB 12.6 12.1 12.5  HCT 40.7 38.8 40.8  MCV 89.5 89.8 90.7  PLT 179 199 206   BMP &GFR Recent Labs  Lab 03/02/21 0801 03/02/21 2145 03/03/21 0308  NA 138  --  137  K 4.8  --  4.9  CL 106  --  104  CO2 24  --  27  GLUCOSE 212*  --  145*  BUN 13  --  11  CREATININE 0.95 0.83 0.85  CALCIUM 9.1  --  9.2   Estimated Creatinine Clearance: 96.8 mL/min (by C-G formula based on SCr of 0.85 mg/dL). Liver & Pancreas: Recent Labs  Lab 03/03/21 0308  AST 19  ALT 16  ALKPHOS 66  BILITOT 0.6  PROT 6.4*  ALBUMIN 3.5   Recent Labs  Lab 03/02/21 0801  LIPASE 28   No results for input(s): AMMONIA in the last 168 hours. Diabetic: No results for input(s): HGBA1C in the last 72 hours. Recent Labs  Lab 03/02/21 2135 03/03/21 0728 03/03/21 1201  GLUCAP 142* 188* 180*   Cardiac Enzymes: No results for input(s): CKTOTAL, CKMB, CKMBINDEX, TROPONINI in the last 168 hours. No results for input(s): PROBNP in the last 8760 hours. Coagulation Profile: No results for input(s): INR, PROTIME in the last 168 hours. Thyroid Function Tests: No results for input(s): TSH, T4TOTAL, FREET4, T3FREE, THYROIDAB in the last 72 hours. Lipid Profile: No results for input(s): CHOL, HDL, LDLCALC, TRIG, CHOLHDL, LDLDIRECT in the last 72 hours. Anemia Panel: No results for input(s): VITAMINB12, FOLATE, FERRITIN, TIBC, IRON, RETICCTPCT in the last 72 hours. Urine analysis:    Component Value Date/Time   COLORURINE YELLOW 03/02/2021 0907   APPEARANCEUR CLOUDY (A) 03/02/2021 0907   LABSPEC 1.011 03/02/2021 0907   PHURINE 6.0 03/02/2021 0907   GLUCOSEU NEGATIVE 03/02/2021 0907   GLUCOSEU NEGATIVE 03/19/2017 0827   HGBUR  SMALL (A) 03/02/2021 0907   BILIRUBINUR NEGATIVE 03/02/2021 0907   BILIRUBINUR negative 01/17/2018 1227   KETONESUR NEGATIVE 03/02/2021 0907   PROTEINUR 30 (A) 03/02/2021 0907   UROBILINOGEN 0.2 01/17/2018 1227   UROBILINOGEN 0.2 03/19/2017 0827   NITRITE NEGATIVE 03/02/2021 0907   LEUKOCYTESUR LARGE (A) 03/02/2021 0907   Sepsis Labs: Invalid input(s): PROCALCITONIN, [place]  Microbiology: Recent Results (from the past 240 hour(s))  Urine Culture     Status: Abnormal   Collection Time: 03/02/21  9:11 AM   Specimen: Urine, Clean Catch  Result Value Ref Range Status   Specimen Description   Final    URINE, CLEAN CATCH Performed at [facility], [place] [address], [place], [place] [address]    Special Requests   Final    NONE Performed at [facility], [place] [address], [place], [place] [address]    Culture MULTIPLE SPECIES PRESENT, SUGGEST RECOLLECTION (A)  Final   Report Status 03/03/2021 FINAL  Final  SARS CORONAVIRUS 2 (TAT 6-24 HRS) Nasopharyngeal Nasopharyngeal Swab     Status: None   Collection Time: 03/02/21 10:40 AM   Specimen: Nasopharyngeal Swab  Result Value Ref Range Status   SARS Coronavirus 2 NEGATIVE NEGATIVE Final    Comment: (NOTE) SARS-CoV-2 target nucleic acids are NOT DETECTED.  The SARS-CoV-2 RNA is generally detectable in upper and lower respiratory specimens during the acute phase of infection. Negative results do not preclude SARS-CoV-2 infection, do not rule out co-infections with other pathogens, and should not be used as the sole basis for treatment or other patient management decisions. Negative results must be combined with clinical observations, patient history, and epidemiological information. The expected result is Negative.  Fact Sheet for Patients: SugarRoll.be  Fact Sheet for Healthcare Providers: https://www.woods-mathews.com/  This test is not yet  approved or cleared by the [place] FDA and  has been authorized for detection and/or diagnosis of SARS-CoV-2 by FDA under an Emergency Use Authorization (EUA). This EUA will remain  in effect (meaning this test can be used) for the duration of  the COVID-19 declaration under Se ction 564(b)(1) of the Act, 21 U.S.C. section 360bbb-3(b)(1), unless the authorization is terminated or revoked sooner.  Performed at [facility], [place] [address], [place], [place] [address]     Radiology Studies: No results found.    Miklo Aken T. [address]  If 7PM-7AM, please contact night-coverage [email or web address] 03/03/2021, 12:59 PM

## 2021-03-03 NOTE — Progress Notes (Signed)
Patient Saturations on Room Air at Rest = 96%  Patient Saturations on [employer] while Ambulating = 92-95%

## 2021-03-04 DIAGNOSIS — I10 Essential (primary) hypertension: Secondary | ICD-10-CM

## 2021-03-04 LAB — HEMOGLOBIN A1C
Hgb A1c MFr Bld: 8.2 % — ABNORMAL HIGH (ref 4.8–5.6)
Hgb A1c MFr Bld: 8.4 % — ABNORMAL HIGH (ref 4.8–5.6)
Mean Plasma Glucose: 189 mg/dL
Mean Plasma Glucose: 194 mg/dL

## 2021-03-04 LAB — URINE CULTURE: Culture: NO GROWTH

## 2021-03-04 LAB — GLUCOSE, CAPILLARY
Glucose-Capillary: 159 mg/dL — ABNORMAL HIGH (ref 70–99)
Glucose-Capillary: 188 mg/dL — ABNORMAL HIGH (ref 70–99)

## 2021-03-04 MED ORDER — LEVOFLOXACIN 750 MG PO TABS: 750.0000 mg | ORAL_TABLET | Freq: Every day | ORAL | 0 refills | Status: AC

## 2021-03-04 MED ORDER — TRIMETHOPRIM 100 MG PO TABS: 100.0000 mg | ORAL_TABLET | Freq: Every day | ORAL | Status: DC

## 2021-03-04 NOTE — Plan of Care (Signed)

## 2021-03-04 NOTE — Discharge Summary (Signed)
Physician Discharge Summary  Gina [name] [id number] DOB: 1966-01-29 DOA: 03/02/2021  PCP: Imagene Riches, NP  Admit date: 03/02/2021 Discharge date: 03/04/2021  Admitted From: Home Disposition: Home  Recommendations for Outpatient Follow-up:  Follow ups as below. Please obtain CBC/BMP/Mag at follow up Consider referral to sleep clinic Patient is at risk for polypharmacy with multiple sedating medication.  Recommend reviewing. Please follow up on the following pending results: None  Home Health: None recommended Equipment/Devices: None recommended  Discharge Condition: Stable CODE STATUS: Full code   Follow-up Information     Imagene Riches, NP. Schedule an appointment as soon as possible for a visit in 1 week(s).   Contact information: [place] [address] [phone]                 Hospital Course: 55 year old F with PMH of HTN, HLD, DM-2, anxiety, morbid obesity, chronic pain, tobacco use and asthma presenting with right flank pain for 8 days.  She was seen by urologist 6 days POA and had urine culture that showed some yeast.  She was empirically treated with oral antibiotics for possible UTI but she became diaphoretic with nausea and pain which prompted her to come to ED.  In ED, afebrile.  No leukocytosis.  UA with large LE and small Hgb and rare bacteria.  CT renal stone study concerning for left pyelonephritis.  Initial urine cultures with multiple species.  Repeat urine culture NGTD.  Patient received 3 doses of IV ceftriaxone in-house, and discharged on p.o. Levaquin for 7 more days to complete treatment course for possible pyelonephritis.  Patient also has acute on chronic low back pain likely due to lumbar paraspinal muscle spasm/strain.  She has been encouraged to stay active.  She is already on multiple pain medications at home.  She has been advised to be cautious with this medication.  She was evaluated by therapy and no needles  identified.  See individual problem list below for more on hospital course.  Discharge Diagnoses:  Acute left Pyleonephritis-patient presents with right-sided back pain and flank pain, nausea and diaphoresis.  No leukocytosis or fever here.  Initial urine culture with multiple species.  Repeat urine culture negative.  No history of ESBL. -Received IV ceftriaxone for 3 days.  Discharged on p.o. Levaquin for 7 more days.   Right lower back/flank pain-this looks musculoskeletal on exam.  Improved. Chronic pain syndrome -Encouraged to stay active -Continue home pain medication cautiously   Acute respiratory failure with hypoxia-desaturated to lower 80s about midnight after pain medication.  She is at risk for OSA/OHS.  Respiratory failure resolved.  Maintain saturation in the range of 92 to 95% with ambulation on room air. -She would benefit from outpatient sleep study   Uncontrolled NIDDM-2 with hyperglycemia, hyperlipidemia and neuropathy: A1c 8.4% -Continue home medications including statin.  Essential hypertension: Normotensive. -Continue home meds   GERD -Continue PPI   Anxiety: Stable -Continue home meds   Tobacco use disorder -Encouraged cessation   At risk for polypharmacy: On multiple sedating medication for pain and anxiety. -Recommend careful review of her medications   Morbid obesity -Encourage lifestyle change to lose weight. Body mass index is 50.3 kg/m. Nutrition Problem: Inadequate oral intake Etiology: acute illness Signs/Symptoms: per patient/family report Interventions: Premier Protein, Prostat, Glucerna shake, MVI      Discharge Exam: Vitals:   03/04/21 0830 03/04/21 1402  BP: (!) 164/68 (!) 142/62  Pulse: 73 66  Resp: 15 18  Temp: 98 F (36.7 C) 98.2 F (36.8 C)  SpO2: 96% 95%    GENERAL: No apparent distress.  Nontoxic. HEENT: MMM.  Vision and hearing grossly intact.  NECK: Supple.  No apparent JVD.  RESP: On RA.  No IWOB.  Fair aeration  bilaterally. CVS:  RRR. Heart sounds normal.  ABD/GI/GU: Bowel sounds present. Soft. Non tender.  MSK/EXT:  Moves extremities. No apparent deformity.  Some tenderness over lumbar paraspinal muscles SKIN: no apparent skin lesion or wound NEURO: Awake, alert and oriented appropriately.  No apparent focal neuro deficit. PSYCH: Calm. Normal affect.   Discharge Instructions  Discharge Instructions     Diet - low sodium heart healthy   Complete by: As directed    Diet Carb Modified   Complete by: As directed    Discharge instructions   Complete by: As directed    It has been a pleasure taking care of you!  You were hospitalized due to lower back pain likely due to possible left kidney infection and back muscle spasm or strain.  We have started you on IV antibiotic for kidney infection.  We are discharging you on more antibiotics to complete treatment course. In regards to your back pain/possible muscle strain, we recommend staying active as tolerated in addition to taking your pain medication as needed.  Note that you are on multiple pain medications with the potential for serious side effects on your mentation and breathing.  We strongly recommend you go over your medications with your prescribers at your next visit.  Please review your new medication list and the directions on your medications before you take them.  Follow-up with your primary care doctor in 1 to 2 weeks.   Take care,   Increase activity slowly   Complete by: As directed       Allergies as of 03/04/2021       Reactions   Bee Venom Anaphylaxis   Darvocet [propoxyphene N-acetaminophen] Nausea And Vomiting   Fish Allergy Anaphylaxis   Morphine And Related Itching   Penicillins Hives, Swelling   Tolerates Rocephin Swelling all over body  Has patient had a PCN reaction causing immediate rash, facial/tongue/throat swelling, SOB or lightheadedness with hypotension: Yes Has patient had a PCN reaction causing severe  rash involving mucus membranes or skin necrosis: Yes Has patient had a PCN reaction that required hospitalization Unknown Has patient had a PCN reaction occurring within the last 10 years: No  If all of the above answers are "NO", then may proceed with Cephalosporin use.   Tape Hives   Adhesive tape   Fentanyl Nausea Only        Medication List     STOP taking these medications    ciprofloxacin 500 MG tablet Commonly known as: CIPRO       TAKE these medications    acetaminophen 500 MG tablet Commonly known as: TYLENOL Take 1,000 mg by mouth every 6 (six) hours as needed for mild pain. Notes to patient: Last dose given 07/25 12:46pm   ALPRAZolam 1 MG tablet Commonly known as: XANAX Take 1 mg by mouth 2 (two) times daily as needed for anxiety. Notes to patient: Resume home regimen   atorvastatin 80 MG tablet Commonly known as: LIPITOR Take 80 mg by mouth at bedtime. What changed: Another medication with the same name was removed. Continue taking this medication, and follow the directions you see here.   cetirizine 10 MG tablet Commonly known as: ZYRTEC Take 10 mg by mouth  at bedtime. Notes to patient: Resume home regimen   cyclobenzaprine 10 MG tablet Commonly known as: FLEXERIL Take 10 mg by mouth daily as needed for muscle spasms. Notes to patient: Resume home regimen   fluticasone 50 MCG/ACT nasal spray Commonly known as: FLONASE Place 1 spray into both nostrils daily. Notes to patient: Resume home regimen   Fluticasone-Salmeterol 250-50 MCG/DOSE Aepb Commonly known as: Wixela Inhub Inhale 1 puff into the lungs 2 (two) times daily. Notes to patient: Resume home regimen   glucose blood test strip Commonly known as: ONE TOUCH ULTRA TEST USE TO TEST BLOOD SUGAR 3 TIMES DAILY   HYDROcodone-acetaminophen 5-325 MG tablet Commonly known as: NORCO/VICODIN Take 1 tablet by mouth every 6 (six) hours as needed for severe pain. Notes to patient: 07/26 2:30pm    levofloxacin 750 MG tablet Commonly known as: Levaquin Take 1 tablet (750 mg total) by mouth daily for 7 days.   lisinopril 5 MG tablet Commonly known as: ZESTRIL Take 5 mg by mouth daily. Notes to patient: Resume home regimen   montelukast 10 MG tablet Commonly known as: SINGULAIR Take 10 mg by mouth every evening.   omeprazole 20 MG capsule Commonly known as: PRILOSEC TAKE 1 CAPSULE (20 MG TOTAL) BY MOUTH DAILY BEFORE BREAKFAST.   Ozempic (0.25 or 0.5 MG/DOSE) 2 MG/1.5ML Sopn Generic drug: Semaglutide(0.25 or 0.'5MG'$ /DOS) Inject 0.5 mg into the skin every Tuesday. Notes to patient: Resume home regimen   polyvinyl alcohol 1.4 % ophthalmic solution Commonly known as: LIQUIFILM TEARS Place 1 drop into both eyes as needed for dry eyes. Notes to patient: Resume home regimen   promethazine 12.5 MG tablet Commonly known as: PHENERGAN Take 12.5 mg by mouth every 8 (eight) hours as needed for nausea/vomiting. Notes to patient: Resume home regimen   trimethoprim 100 MG tablet Commonly known as: TRIMPEX Take 1 tablet (100 mg total) by mouth at bedtime. Start taking on: March 11, 2021 What changed: These instructions start on March 11, 2021. If you are unsure what to do until then, ask your doctor or other care provider. Notes to patient: Resume home regimen       ASK your doctor about these medications    nystatin powder Commonly known as: nystatin Apply topically 3 (three) times daily as needed. Notes to patient: Resume home regimen   pregabalin 200 MG capsule Commonly known as: Lyrica TAKE 1 CAPSULE BY MOUTH THREE TIMES A DAY Notes to patient: Last dose given 07/26 08:31am   Ventolin HFA 108 (90 Base) MCG/ACT inhaler Generic drug: albuterol INHALE 2 PUFFS EVERY 6 (SIX) HOURS AS NEEDED INTO THE LUNGS FOR WHEEZING OR SHORTNESS OF BREATH. Notes to patient: Resume home regimen        Consultations: None  Procedures/Studies:  CT Renal Stone Study  Result  Date: 03/02/2021 CLINICAL DATA:  Flank pain with kidney stone suspected. EXAM: CT ABDOMEN AND PELVIS WITHOUT CONTRAST TECHNIQUE: Multidetector CT imaging of the abdomen and pelvis was performed following the standard protocol without IV contrast. COMPARISON:  11/10/2020 FINDINGS: Lower chest:  Coronary atherosclerosis Hepatobiliary: Negative for mass or adenopathy. Cholecystectomy. Pancreas: Unremarkable. Spleen: Unremarkable. Adrenals/Urinary Tract: Negative adrenals. Two tiny left lower pole renal calculi. Asymmetric left perinephric stranding. No hydronephrosis or ureteral stone. Lobulated bilateral renal cortex from scarring. Unremarkable bladder. Stomach/Bowel: No obstruction. No visible bowel inflammation. Fatty densities in the proximal small bowel which were not seen on prior and attributed to ingested material. Vascular/Lymphatic: No acute vascular abnormality. Atheromatous calcifications. No mass or  adenopathy. Reproductive:Hysterectomy Other: No ascites or pneumoperitoneum. Subcutaneous reticulation to the pannus which is chronic and stable. Fatty midline hernia. Musculoskeletal: No acute abnormalities. Generalized spondylosis and disc degeneration. Endplate spurs cause multilevel lower thoracic ankylosis. IMPRESSION: 1. No hydronephrosis or ureteral stone. 2. Asymmetric left-sided perinephric stranding, upper tract infection would be considered if there is UTI. 3. Two punctate left renal calculi. 4. Chronic panniculitis. Electronically Signed   By: Monte Fantasia M.D.   On: 03/02/2021 08:46       The results of significant diagnostics from this hospitalization (including imaging, microbiology, ancillary and laboratory) are listed below for reference.     Microbiology: Recent Results (from the past 240 hour(s))  Urine Culture     Status: Abnormal   Collection Time: 03/02/21  9:11 AM   Specimen: Urine, Clean Catch  Result Value Ref Range Status   Specimen Description   Final    URINE, CLEAN  CATCH Performed at [facility], [place] [address], [place], [place] [address]    Special Requests   Final    NONE Performed at [facility], [place] [address] [place], [place] [address]    Culture MULTIPLE SPECIES PRESENT, SUGGEST RECOLLECTION (A)  Final   Report Status 03/03/2021 FINAL  Final  SARS CORONAVIRUS 2 (TAT 6-24 HRS) Nasopharyngeal Nasopharyngeal Swab     Status: None   Collection Time: 03/02/21 10:40 AM   Specimen: Nasopharyngeal Swab  Result Value Ref Range Status   SARS Coronavirus 2 NEGATIVE NEGATIVE Final    Comment: (NOTE) SARS-CoV-2 target nucleic acids are NOT DETECTED.  The SARS-CoV-2 RNA is generally detectable in upper and lower respiratory specimens during the acute phase of infection. Negative results do not preclude SARS-CoV-2 infection, do not rule out co-infections with other pathogens, and should not be used as the sole basis for treatment or other patient management decisions. Negative results must be combined with clinical observations, patient history, and epidemiological information. The expected result is Negative.  Fact Sheet for Patients: SugarRoll.be  Fact Sheet for Healthcare Providers: https://www.woods-mathews.com/  This test is not yet approved or cleared by the [place] FDA and  has been authorized for detection and/or diagnosis of SARS-CoV-2 by FDA under an Emergency Use Authorization (EUA). This EUA will remain  in effect (meaning this test can be used) for the duration of the COVID-19 declaration under Se ction 564(b)(1) of the Act, 21 U.S.C. section 360bbb-3(b)(1), unless the authorization is terminated or revoked sooner.  Performed at [facility], [place] [address], [place], [place] [address]   Urine Culture     Status: None   Collection Time: 03/03/21  2:45 PM   Specimen: Urine, Catheterized  Result Value Ref Range Status   Specimen  Description   Final    URINE, CATHETERIZED Performed at [place] [address], [place], [place] [address]    Special Requests   Final    NONE Performed at [facility], [place] [address] [place], [place] [address]    Culture   Final    NO GROWTH Performed at [facility], [place] [address] [place], [place] [address]    Report Status 03/04/2021 FINAL  Final     Labs:  CBC: Recent Labs  Lab 03/02/21 0801 03/02/21 2145 03/03/21 0308  WBC 5.6 7.7 7.4  NEUTROABS 3.8  --   --   HGB 12.6 12.1 12.5  HCT 40.7 38.8 40.8  MCV 89.5 89.8 90.7  PLT 179 199 206   BMP &GFR Recent Labs  Lab 03/02/21 0801 03/02/21 2145 03/03/21 0308  NA 138  --  137  K 4.8  --  4.9  CL 106  --  104  CO2 24  --  27  GLUCOSE 212*  --  145*  BUN 13  --  11  CREATININE 0.95 0.83 0.85  CALCIUM 9.1  --  9.2   Estimated Creatinine Clearance: 96.8 mL/min (by C-G formula based on SCr of 0.85 mg/dL). Liver & Pancreas: Recent Labs  Lab 03/03/21 0308  AST 19  ALT 16  ALKPHOS 66  BILITOT 0.6  PROT 6.4*  ALBUMIN 3.5   Recent Labs  Lab 03/02/21 0801  LIPASE 28   No results for input(s): AMMONIA in the last 168 hours. Diabetic: Recent Labs    03/02/21 2145 03/03/21 0308  HGBA1C 8.2* 8.4*   Recent Labs  Lab 03/03/21 1201 03/03/21 1633 03/03/21 2118 03/04/21 0725 03/04/21 1128  GLUCAP 180* 206* 151* 159* 188*   Cardiac Enzymes: No results for input(s): CKTOTAL, CKMB, CKMBINDEX, TROPONINI in the last 168 hours. No results for input(s): PROBNP in the last 8760 hours. Coagulation Profile: No results for input(s): INR, PROTIME in the last 168 hours. Thyroid Function Tests: No results for input(s): TSH, T4TOTAL, FREET4, T3FREE, THYROIDAB in the last 72 hours. Lipid Profile: No results for input(s): CHOL, HDL, LDLCALC, TRIG, CHOLHDL, LDLDIRECT in the last 72 hours. Anemia Panel: No results for input(s): VITAMINB12, FOLATE, FERRITIN, TIBC,  IRON, RETICCTPCT in the last 72 hours. Urine analysis:    Component Value Date/Time   COLORURINE YELLOW 03/02/2021 0907   APPEARANCEUR CLOUDY (A) 03/02/2021 0907   LABSPEC 1.011 03/02/2021 0907   PHURINE 6.0 03/02/2021 0907   GLUCOSEU NEGATIVE 03/02/2021 0907   GLUCOSEU NEGATIVE 03/19/2017 0827   HGBUR SMALL (A) 03/02/2021 0907   BILIRUBINUR NEGATIVE 03/02/2021 0907   BILIRUBINUR negative 01/17/2018 1227   KETONESUR NEGATIVE 03/02/2021 0907   PROTEINUR 30 (A) 03/02/2021 0907   UROBILINOGEN 0.2 01/17/2018 1227   UROBILINOGEN 0.2 03/19/2017 0827   NITRITE NEGATIVE 03/02/2021 0907   LEUKOCYTESUR LARGE (A) 03/02/2021 0907   Sepsis Labs: Invalid input(s): PROCALCITONIN, LACTICIDVEN   Time coordinating discharge: 45 minutes  SIGNED:  Mercy Riding, MD  [facility] 03/04/2021, 5:23 PM  If 7PM-7AM, please contact night-coverage [email or web address]

## 2021-03-04 NOTE — Evaluation (Signed)
Occupational Therapy Evaluation Patient Details Name: Gina [name] MRN: [id number] DOB: April 27, 1966 Today's Date: 03/04/2021    History of Present Illness patient is a 55 yearold female who presented to the ED on 7/24 with right flank pain. patient was found to have pyelonephritis, chronic pain syndrome, and acute respiratory failure. PMH: asthma, HTN, chronic pain syndrome, fibromyalgia, IDDM, OSA, prior exophytic right renal mass right kidney, multifactorial anemia.   Clinical Impression   Patient evaluated by Occupational Therapy with no further acute OT needs identified. All education has been completed and the patient has no further questions. Patient was able to complete functional mobility in room with IV pole with MI with toileting tasks at MI as well. Patient reported standing at sink this AM to complete bathing tasks. Patients O2 maintain 94 on RA during session.  See below for any follow-up Occupational Therapy or equipment needs. OT is signing off.      Follow Up Recommendations  No OT follow up    Equipment Recommendations  None recommended by OT    Recommendations for Other Services       Precautions / Restrictions Restrictions Weight Bearing Restrictions: No      Mobility Bed Mobility Overal bed mobility: Modified Independent                  Transfers Overall transfer level: Modified independent                    Balance Overall balance assessment: Modified Independent                                         ADL either performed or assessed with clinical judgement   ADL Overall ADL's : Modified independent                                       General ADL Comments: patient was able to complete ADL tasks with IV pole with MI. patietn completed simulated toileting taks with MI with IV pole management. patient was educated on energy conservation tasks for home. patient verbalized understanding. patient was  noted to be wearing shoes that were too large for her feet. patient reported that they were her husbands shoes. patient was educated on importance of wearing shoes that were fitted to her to reduce falls risk. patient verbalized understanding and reported she had shoes that fit her at home.     Vision Patient Visual Report: No change from baseline       Perception     Praxis      Pertinent Vitals/Pain Pain Assessment: 0-10 Pain Score: 6  Pain Location: bilateral sides and back Pain Descriptors / Indicators: Stabbing;Aching Pain Intervention(s): Limited activity within patient's tolerance;Monitored during session     Hand Dominance Right   Extremity/Trunk Assessment Upper Extremity Assessment Upper Extremity Assessment: Overall WFL for tasks assessed   Lower Extremity Assessment Lower Extremity Assessment: Defer to PT evaluation   Cervical / Trunk Assessment Cervical / Trunk Assessment: Normal   Communication Communication Communication: No difficulties   Cognition Arousal/Alertness: Awake/alert Behavior During Therapy: WFL for tasks assessed/performed Overall Cognitive Status: Within Functional Limits for tasks assessed  General Comments       Exercises     Shoulder Instructions      Home Living Family/patient expects to be discharged to:: Private residence Living Arrangements: Spouse/significant other;Children Available Help at Discharge: Family;Available 24 hours/day Type of Home: House Home Access: Stairs to enter [employer] of Steps: 4-5 Entrance Stairs-Rails: Right;Left;Can reach both Home Layout: One level     Bathroom Shower/Tub: Walk-in shower;Door;Curtain   Bathroom Toilet: Handicapped height     Home Equipment: Environmental consultant - 2 wheels;Shower seat   Additional Comments: only uses AD when back gets bad.      Prior Functioning/Environment Level of Independence: Independent                  OT Problem List:        OT Treatment/Interventions:      OT Goals(Current goals can be found in the care plan section)    OT Frequency:     Barriers to D/C:            Co-evaluation              AM-PAC OT "6 Clicks" Daily Activity     Outcome Measure Help from another person eating meals?: None Help from another person taking care of personal grooming?: None Help from another person toileting, which includes using toliet, bedpan, or urinal?: None Help from another person bathing (including washing, rinsing, drying)?: None Help from another person to put on and taking off regular upper body clothing?: None Help from another person to put on and taking off regular lower body clothing?: None 6 Click Score: 24   End of Session Nurse Communication: Other (comment) (nurse cleared patient for session)  Activity Tolerance: Patient tolerated treatment well Patient left: in bed;with family/visitor present;with call bell/phone within reach  OT Visit Diagnosis: Muscle weakness (generalized) (M62.81)                Time: [id number] OT Time Calculation (min): 16 min Charges:  OT General Charges $OT Visit: 1 Visit OT Evaluation $OT Eval Low Complexity: 1 Low  Jackelyn Poling OTR/L, MS Acute Rehabilitation Department Office# [phone] Pager# [phone]   [place] 03/04/2021, 12:01 PM

## 2021-03-04 NOTE — Progress Notes (Signed)
PT Cancellation/discharge Note  Patient Details Name: Gina [name] MRN: [id number] DOB: 01-25-1966   Cancelled Treatment:    Reason Eval/Treat Not Completed:  Per chart, pt discharged home. Will sign off.    [facility]  Office: [phone] Pager: [phone]

## 2021-03-04 NOTE — Progress Notes (Signed)
Provided discharge education/instructions, all questions and concerns addressed, Pt not in distress, discharged home with belongings accompanied by husband. 

## 2021-03-12 DIAGNOSIS — N390 Urinary tract infection, site not specified: Secondary | ICD-10-CM | POA: Diagnosis not present

## 2021-03-12 DIAGNOSIS — R5383 Other fatigue: Secondary | ICD-10-CM | POA: Diagnosis not present

## 2021-03-12 DIAGNOSIS — Z79899 Other long term (current) drug therapy: Secondary | ICD-10-CM | POA: Diagnosis not present

## 2021-03-12 DIAGNOSIS — M545 Low back pain, unspecified: Secondary | ICD-10-CM | POA: Diagnosis not present

## 2021-03-12 DIAGNOSIS — E785 Hyperlipidemia, unspecified: Secondary | ICD-10-CM | POA: Diagnosis not present

## 2021-03-12 DIAGNOSIS — E1165 Type 2 diabetes mellitus with hyperglycemia: Secondary | ICD-10-CM | POA: Diagnosis not present

## 2021-03-12 DIAGNOSIS — Z09 Encounter for follow-up examination after completed treatment for conditions other than malignant neoplasm: Secondary | ICD-10-CM | POA: Diagnosis not present

## 2021-03-12 DIAGNOSIS — Z5181 Encounter for therapeutic drug level monitoring: Secondary | ICD-10-CM | POA: Diagnosis not present

## 2021-03-13 DIAGNOSIS — R1084 Generalized abdominal pain: Secondary | ICD-10-CM | POA: Diagnosis not present

## 2021-03-13 DIAGNOSIS — N3020 Other chronic cystitis without hematuria: Secondary | ICD-10-CM | POA: Diagnosis not present

## 2021-03-13 DIAGNOSIS — R3121 Asymptomatic microscopic hematuria: Secondary | ICD-10-CM | POA: Diagnosis not present

## 2021-03-23 DIAGNOSIS — J4521 Mild intermittent asthma with (acute) exacerbation: Secondary | ICD-10-CM | POA: Diagnosis not present

## 2021-04-09 ENCOUNTER — Encounter: Payer: Self-pay | Admitting: Acute Care

## 2021-04-09 ENCOUNTER — Telehealth (INDEPENDENT_AMBULATORY_CARE_PROVIDER_SITE_OTHER): Payer: Medicare Other | Admitting: Acute Care

## 2021-04-09 ENCOUNTER — Ambulatory Visit
Admission: RE | Admit: 2021-04-09 | Discharge: 2021-04-09 | Disposition: A | Discharge status: Home / Self Care | Payer: Medicare Other | Source: Ambulatory Visit | Attending: Acute Care | Admitting: Acute Care

## 2021-04-09 ENCOUNTER — Other Ambulatory Visit: Payer: Self-pay

## 2021-04-09 DIAGNOSIS — F17210 Nicotine dependence, cigarettes, uncomplicated: Secondary | ICD-10-CM

## 2021-04-09 DIAGNOSIS — Z87891 Personal history of nicotine dependence: Secondary | ICD-10-CM

## 2021-04-09 NOTE — Patient Instructions (Signed)
Thank you for participating in the [facility]. It was our pleasure to meet you today. We will call you with the results of your scan within the next few days. Your scan will be assigned a Lung RADS category score by the physicians reading the scans.  This Lung RADS score determines follow up scanning.  See below for description of categories, and follow up screening recommendations. We will be in touch to schedule your follow up screening annually or based on recommendations of our providers. We will fax a copy of your scan results to your Primary Care Physician, or the physician who referred you to the program, to ensure they have the results. Please call the office if you have any questions or concerns regarding your scanning experience or results.  Our office number is [phone]. Please speak with Denise Phelps, RN. She is our Lung Cancer Screening RN. If she is unavailable when you call, please have the office staff send her a message. She will return your call at her earliest convenience. Remember, if your scan is normal, we will scan you annually as long as you continue to meet the criteria for the program. (Age 55-77, Current smoker or smoker who has quit within the last 15 years). If you are a smoker, remember, quitting is the single most powerful action that you can take to decrease your risk of lung cancer and other pulmonary, breathing related problems. We know quitting is hard, and we are here to help.  Please let us know if there is anything we can do to help you meet your goal of quitting. If you are a former smoker, congratulations. We are proud of you! Remain smoke free! Remember you can refer friends or family members through the number above.  We will screen them to make sure they meet criteria for the program. Thank you for helping us take better care of you by participating in Lung Screening.  Lung RADS Categories:  Lung RADS 1: no nodules  or definitely non-concerning nodules.  Recommendation is for a repeat annual scan in 12 months.  Lung RADS 2:  nodules that are non-concerning in appearance and behavior with a very low likelihood of becoming an active cancer. Recommendation is for a repeat annual scan in 12 months.  Lung RADS 3: nodules that are probably non-concerning , includes nodules with a low likelihood of becoming an active cancer.  Recommendation is for a 6-month repeat screening scan. Often noted after an upper respiratory illness. We will be in touch to make sure you have no questions, and to schedule your 6-month scan.  Lung RADS 4 A: nodules with concerning findings, recommendation is most often for a follow up scan in 3 months or additional testing based on our provider's assessment of the scan. We will be in touch to make sure you have no questions and to schedule the recommended 3 month follow up scan.  Lung RADS 4 B:  indicates findings that are concerning. We will be in touch with you to schedule additional diagnostic testing based on our provider's  assessment of the scan.   

## 2021-04-09 NOTE — Progress Notes (Signed)
Virtual Visit via Video Note  I connected with Gina [name] on 04/09/21 at  9:30 AM EDT by a video enabled telemedicine application and verified that I am speaking with the correct person using two identifiers.  Location: Patient: At home Provider: [place], [place], [place], [address]    I discussed the limitations of evaluation and management by telemedicine and the availability of in person appointments. The patient expressed understanding and agreed to proceed.  Shared Decision Making Visit Lung Cancer Screening Program [id number])   Eligibility: Age 55 y.o. Pack Years Smoking History Calculation 32 pack year smoking history (# packs/per year x # years smoked) Recent History of coughing up blood  no Unexplained weight loss? no ( >Than 15 pounds within the last 6 months ) Prior History Lung / other cancer no (Diagnosis within the last 5 years already requiring surveillance chest CT Scans). Smoking Status Current Smoker Former Smokers: Years since quit: NA  Quit Date: NA  Visit Components: Discussion included one or more decision making aids. yes Discussion included risk/benefits of screening. yes Discussion included potential follow up diagnostic testing for abnormal scans. yes Discussion included meaning and risk of over diagnosis. yes Discussion included meaning and risk of False Positives. yes Discussion included meaning of total radiation exposure. yes  Counseling Included: Importance of adherence to annual lung cancer LDCT screening. yes Impact of comorbidities on ability to participate in the program. yes Ability and willingness to under diagnostic treatment. yes  Smoking Cessation Counseling: Current Smokers:  Discussed importance of smoking cessation. yes Information about tobacco cessation classes and interventions provided to patient. yes Patient provided with "ticket" for LDCT Scan. yes Symptomatic Patient. no  Counseling Diagnosis Code: Tobacco  Use Z72.0 Asymptomatic Patient yes  Counseling (Intermediate counseling: > three minutes counseling) [id number] Former Smokers:  Discussed the importance of maintaining cigarette abstinence. yes Diagnosis Code: Personal History of Nicotine Dependence. [id number] Information about tobacco cessation classes and interventions provided to patient. Yes Patient provided with "ticket" for LDCT Scan. yes Written Order for Lung Cancer Screening with LDCT placed in Epic. Yes (CT Chest Lung Cancer Screening Low Dose W/O CM) [id number] Z12.2-Screening of respiratory organs Z87.891-Personal history of nicotine dependence  I have spent 25 minutes of face to face time with Ms. Blanke discussing the risks and benefits of lung cancer screening. We viewed a power point together that explained in detail the above noted topics. We paused at intervals to allow for questions to be asked and answered to ensure understanding.We discussed that the single most powerful action that she can take to decrease her risk of developing lung cancer is to quit smoking. We discussed whether or not she is ready to commit to setting a quit date. We discussed options for tools to aid in quitting smoking including nicotine replacement therapy, non-nicotine medications, support groups, Quit Smart classes, and behavior modification. We discussed that often times setting smaller, more achievable goals, such as eliminating 1 cigarette a day for a week and then 2 cigarettes a day for a week can be helpful in slowly decreasing the number of cigarettes smoked. This allows for a sense of accomplishment as well as providing a clinical benefit. I gave her the " Be Stronger Than Your Excuses" card with contact information for community resources, classes, free nicotine replacement therapy, and access to mobile apps, text messaging, and on-line smoking cessation help. I have also given her my card and contact information in the event she needs to contact me.  We  discussed the time and location of the scan, and that either Doroteo Glassman RN or I will call with the results within 24-48 hours of receiving them. I have offered her  a copy of the power point we viewed  as a resource in the event they need reinforcement of the concepts we discussed today in the office. The patient verbalized understanding of all of  the above and had no further questions upon leaving the office. They have my contact information in the event they have any further questions.  I spent 3 minutes counseling on smoking cessation and the health risks of continued tobacco abuse.  I explained to the patient that there has been a high incidence of coronary artery disease noted on these exams. I explained that this is a non-gated exam therefore degree or severity cannot be determined. This patient is on statin therapy. I have asked the patient to follow-up with their PCP regarding any incidental finding of coronary artery disease and management with diet or medication as their PCP  feels is clinically indicated. The patient verbalized understanding of the above and had no further questions upon completion of the visit.      Magdalen Spatz, NP 04/09/2021

## 2021-04-18 ENCOUNTER — Telehealth: Payer: Self-pay | Admitting: *Deleted

## 2021-04-18 DIAGNOSIS — Z87891 Personal history of nicotine dependence: Secondary | ICD-10-CM

## 2021-04-18 DIAGNOSIS — F17210 Nicotine dependence, cigarettes, uncomplicated: Secondary | ICD-10-CM

## 2021-04-18 NOTE — Telephone Encounter (Signed)
Left detailed message on voicemail per DPR with CT results. Copy of CT faxed to PCP. Order placed for 1 yr f/u low dose ct.

## 2021-04-23 ENCOUNTER — Encounter: Payer: Self-pay | Admitting: *Deleted

## 2021-04-23 DIAGNOSIS — J4521 Mild intermittent asthma with (acute) exacerbation: Secondary | ICD-10-CM | POA: Diagnosis not present

## 2021-05-04 NOTE — Progress Notes (Signed)
Please call patient and let them  know their  low dose Ct was read as a Lung RADS 1, negative study: no nodules or definitely benign nodules. Radiology recommendation is for a repeat LDCT in 12 months. .Please let them  know we will order and schedule their  annual screening scan for 04/2022. Please let them  know there was notation of CAD on their  scan.  Please remind the patient  that this is a non-gated exam therefore degree or severity of disease  cannot be determined. Please have them  follow up with their PCP regarding potential risk factor modification, dietary therapy or pharmacologic therapy if clinically indicated. Pt.  is  currently on statin therapy. Please place order for annual  screening scan for  04/2022 and fax results to PCP. Thanks so much.  + CAD letter , She is not seen by cards, she is on statins , please have her follow up with PCP

## 2021-06-13 DIAGNOSIS — Z1159 Encounter for screening for other viral diseases: Secondary | ICD-10-CM | POA: Diagnosis not present

## 2021-06-13 DIAGNOSIS — R051 Acute cough: Secondary | ICD-10-CM | POA: Diagnosis not present

## 2021-06-13 DIAGNOSIS — J029 Acute pharyngitis, unspecified: Secondary | ICD-10-CM | POA: Diagnosis not present

## 2021-06-13 DIAGNOSIS — R059 Cough, unspecified: Secondary | ICD-10-CM | POA: Diagnosis not present

## 2021-06-13 DIAGNOSIS — N390 Urinary tract infection, site not specified: Secondary | ICD-10-CM | POA: Diagnosis not present

## 2021-06-13 DIAGNOSIS — Z112 Encounter for screening for other bacterial diseases: Secondary | ICD-10-CM | POA: Diagnosis not present

## 2021-06-13 DIAGNOSIS — U071 COVID-19: Secondary | ICD-10-CM | POA: Diagnosis not present

## 2021-07-16 NOTE — Progress Notes (Deleted)
Cardiology Office Note   Date:  07/16/2021   ID:  AZUCENA DART, DOB 12/26/1965, MRN [id number]  PCP:  Imagene Riches, NP  Cardiologist:   Jemuel Laursen [place]ique, MD   No chief complaint on file.     History of Present Illness: Gina [name] is a 55 y.o. female who is seen at the request of Duard Larsen NP for evaluation of coronary calcification noted on CT. She has a history of DM type 2, HLD, HTN. Is also an active smoker. CT done for cancer screening showed calcification in the coronary arteries and aorta. She did have prior cardiac evaluation in 2014 and 2016 with Myoview studies which were normal.     Past Medical History:  Diagnosis Date   Anemia    Anxiety    Arthritis    Asthma    Back pain    Chronic pain    Diabetes mellitus    type 2   Dyspnea    with exersion    Family history of adverse reaction to anesthesia    youngest daughter hard to wake up both daughters post op n/v   Fibromyalgia    GERD (gastroesophageal reflux disease)    Headache    migraines   History of kidney stones    2 big stones in left kidney now   History of recurrent UTIs    last uti 3 weeks ago   Hyperlipidemia    Hypertension    IBS (irritable bowel syndrome)    Obesities, morbid (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 at [facility]   Umbilical hernia    Wound discharge    open wound right leg going to wound center changes dressing qday with santyl cream quarter size wound    Past Surgical History:  Procedure Laterality Date   ABDOMINAL HYSTERECTOMY     1 ovary left   CARPAL TUNNEL RELEASE Right    CESAREAN SECTION     x 2   CHOLECYSTECTOMY     COLONOSCOPY WITH PROPOFOL N/A 12/03/2016   Procedure: COLONOSCOPY WITH PROPOFOL;  Surgeon: Doran Stabler, MD;  Location: [facility];  Service:  Gastroenterology;  Laterality: N/A;   CYSTECTOMY     left foot   CYSTOSCOPY/URETEROSCOPY/HOLMIUM LASER/STENT PLACEMENT Left 03/18/2020   Procedure: CYSTOSCOPY/LEFT RETROGRADE PYELOGRAM, STENT PLACEMENT;  Surgeon: Ceasar Mons, MD;  Location: [facility];  Service: Urology;  Laterality: Left;   ESOPHAGOGASTRODUODENOSCOPY (EGD) WITH PROPOFOL N/A 12/03/2016   Procedure: ESOPHAGOGASTRODUODENOSCOPY (EGD) WITH PROPOFOL;  Surgeon: Doran Stabler, MD;  Location: [facility];  Service: Gastroenterology;  Laterality: N/A;   LITHOTRIPSY     MULTIPLE TOOTH EXTRACTIONS     URETEROSCOPY WITH HOLMIUM LASER LITHOTRIPSY Left 04/09/2020   Procedure: URETEROSCOPY WITH HOLMIUM LASER LITHOTRIPSY/ STENT CHANGE;  Surgeon: Festus Aloe, MD;  Location: [facility];  Service: Urology;  Laterality: Left;     Current Outpatient Medications  Medication Sig Dispense Refill   acetaminophen (TYLENOL) 500 MG tablet Take 1,000 mg by mouth every 6 (six) hours as needed for mild pain.     ALPRAZolam (XANAX) 1 MG tablet Take 1 mg by mouth 2 (two) times daily as needed for anxiety.      atorvastatin (LIPITOR) 80 MG tablet  Take 80 mg by mouth at bedtime.     cetirizine (ZYRTEC) 10 MG tablet Take 10 mg by mouth at bedtime.     cyclobenzaprine (FLEXERIL) 10 MG tablet Take 10 mg by mouth daily as needed for muscle spasms.     fluticasone (FLONASE) 50 MCG/ACT nasal spray Place 1 spray into both nostrils daily.     Fluticasone-Salmeterol (WIXELA INHUB) 250-50 MCG/DOSE AEPB Inhale 1 puff into the lungs 2 (two) times daily. 60 each 1   glucose blood (ONE TOUCH ULTRA TEST) test strip USE TO TEST BLOOD SUGAR 3 TIMES DAILY 100 each 3   HYDROcodone-acetaminophen (NORCO/VICODIN) 5-325 MG tablet Take 1 tablet by mouth every 6 (six) hours as needed for severe pain.     lisinopril (ZESTRIL) 5 MG tablet Take 5 mg by mouth daily.     montelukast (SINGULAIR) 10 MG tablet Take 10 mg by mouth every evening.     nystatin (NYSTATIN) powder  Apply topically 3 (three) times daily as needed. (Patient taking differently: Apply 1 application topically 3 (three) times daily as needed (chaffing).) 60 g 6   omeprazole (PRILOSEC) 20 MG capsule TAKE 1 CAPSULE (20 MG TOTAL) BY MOUTH DAILY BEFORE BREAKFAST. 90 capsule 2   OZEMPIC, 0.25 OR 0.5 MG/DOSE, 2 MG/1.5ML SOPN Inject 0.5 mg into the skin every Tuesday.     polyvinyl alcohol (LIQUIFILM TEARS) 1.4 % ophthalmic solution Place 1 drop into both eyes as needed for dry eyes.     pregabalin (LYRICA) 200 MG capsule TAKE 1 CAPSULE BY MOUTH THREE TIMES A DAY (Patient taking differently: No sig reported) 90 capsule 2   promethazine (PHENERGAN) 12.5 MG tablet Take 12.5 mg by mouth every 8 (eight) hours as needed for nausea/vomiting.     trimethoprim (TRIMPEX) 100 MG tablet Take 1 tablet (100 mg total) by mouth at bedtime.     VENTOLIN HFA 108 (90 Base) MCG/ACT inhaler INHALE 2 PUFFS EVERY 6 (SIX) HOURS AS NEEDED INTO THE LUNGS FOR WHEEZING OR SHORTNESS OF BREATH. (Patient taking differently: Inhale 2 puffs into the lungs every 6 (six) hours as needed for wheezing or shortness of breath.) 18 Inhaler 4   No current facility-administered medications for this visit.    Allergies:   Bee venom, Darvocet [propoxyphene n-acetaminophen], Fish allergy, Morphine and related, Penicillins, Tape, and Fentanyl    Social History:  The patient  reports that she has been smoking cigarettes. She started smoking about 37 years ago. She has a 33.00 pack-year smoking history. She has never used smokeless tobacco. She reports that she does not drink alcohol and does not use drugs.   Family History:  The patient's ***family history includes Cancer in her mother; Cirrhosis in her mother; Diabetes in her father; Heart failure in her father; Hypertension in her father.    ROS:  Please see the history of present illness.   Otherwise, review of systems are positive for {NONE DEFAULTED:18576}.   All other systems are reviewed and  negative.    PHYSICAL EXAM: VS:  There were no vitals taken for this visit. , BMI There is no height or weight on file to calculate BMI. GEN: Well nourished, well developed, in no acute distress HEENT: normal Neck: no JVD, carotid bruits, or masses Cardiac: ***RRR; no murmurs, rubs, or gallops,no edema  Respiratory:  clear to auscultation bilaterally, normal work of breathing GI: soft, nontender, nondistended, + BS MS: no deformity or atrophy Skin: warm and dry, no rash Neuro:  Strength and sensation are  intact Psych: euthymic mood, full affect   EKG:  EKG {ACTION; IS/IS [id number]} ordered today. The ekg ordered today demonstrates ***   Recent Labs: 03/03/2021: ALT 16; BUN 11; Creatinine, Ser 0.85; Hemoglobin 12.5; Platelets 206; Potassium 4.9; Sodium 137  Dated 05/20/20: cholesterol 274, triglycerides 341, HDL 42. Non HDL 232.    Lipid Panel No results found for: CHOL, TRIG, HDL, CHOLHDL, VLDL, LDLCALC, LDLDIRECT    Wt Readings from Last 3 Encounters:  03/02/21 283 lb 15.2 oz (128.8 kg)  02/20/21 284 lb (128.8 kg)  04/03/20 291 lb (132 kg)      Other studies Reviewed: Additional studies/ records that were reviewed today include: ***. Review of the above records demonstrates: ***   ASSESSMENT AND PLAN:  1.  ***   Current medicines are reviewed at length with the patient today.  The patient {ACTIONS; HAS/DOES NOT HAVE:19233} concerns regarding medicines.  The following changes have been made:  {PLAN; NO CHANGE:13088:s}  Labs/ tests ordered today include: *** No orders of the defined types were placed in this encounter.        Disposition:   FU with *** in {gen number 9-81:[id number]} {Days to years:10300}  Signed, Amada Hallisey Martinique, MD  07/16/2021 4:55 PM    [facility] [address] [place], [place], [address] Phone [phone], Fax [phone]

## 2021-07-21 ENCOUNTER — Ambulatory Visit: Payer: Medicare Other | Admitting: Cardiology

## 2021-07-30 DIAGNOSIS — Z79899 Other long term (current) drug therapy: Secondary | ICD-10-CM | POA: Diagnosis not present

## 2021-07-30 DIAGNOSIS — Z112 Encounter for screening for other bacterial diseases: Secondary | ICD-10-CM | POA: Diagnosis not present

## 2021-07-30 DIAGNOSIS — Z1159 Encounter for screening for other viral diseases: Secondary | ICD-10-CM | POA: Diagnosis not present

## 2021-07-30 DIAGNOSIS — E1100 Type 2 diabetes mellitus with hyperosmolarity without nonketotic hyperglycemic-hyperosmolar coma (NKHHC): Secondary | ICD-10-CM | POA: Diagnosis not present

## 2021-07-30 DIAGNOSIS — Z5181 Encounter for therapeutic drug level monitoring: Secondary | ICD-10-CM | POA: Diagnosis not present

## 2021-08-08 DIAGNOSIS — R8271 Bacteriuria: Secondary | ICD-10-CM | POA: Diagnosis not present

## 2021-08-08 DIAGNOSIS — R3121 Asymptomatic microscopic hematuria: Secondary | ICD-10-CM | POA: Diagnosis not present

## 2021-08-08 DIAGNOSIS — R1084 Generalized abdominal pain: Secondary | ICD-10-CM | POA: Diagnosis not present

## 2021-08-14 DIAGNOSIS — R350 Frequency of micturition: Secondary | ICD-10-CM | POA: Diagnosis not present

## 2021-08-14 DIAGNOSIS — R1084 Generalized abdominal pain: Secondary | ICD-10-CM | POA: Diagnosis not present

## 2021-08-18 NOTE — Progress Notes (Signed)
Cardiology Office Note   Date:  08/29/2021   ID:  Gina [name], DOB 1965-10-27, MRN [id number]  PCP:  Imagene Riches, NP  Cardiologist:   Neiva Maenza Martinique, MD   Chief Complaint  Patient presents with   Coronary Artery Disease      History of Present Illness: Gina [name] is a 56 y.o. female who is seen at the request of Duard Larsen NP for evaluation of coronary calcification noted on CT. She has a history of DM type 2, HLD, HTN. Is also an active smoker. CT done for cancer screening showed calcification in the coronary arteries and aorta. She did have prior cardiac evaluation in 2014 and 2016 with Myoview studies which were normal.  Echo  done in 2018 was normal  More recently she had a CT for screening and was noted to have a lot of coronary and aortic atherosclerosis. She does note she gets chest pain when anxious or stressed or with lifting. Her activity is limited by back pain and neuropathy. Does some light housework. Walking is limited. She was started on statin in the past few months and reports recent lab work was OK.     Past Medical History:  Diagnosis Date   Anemia    Anxiety    Arthritis    Asthma    Back pain    Chronic pain    Diabetes mellitus    type 2   Dyspnea    with exersion    Family history of adverse reaction to anesthesia    youngest daughter hard to wake up both daughters post op n/v   Fibromyalgia    GERD (gastroesophageal reflux disease)    Headache    migraines   History of kidney stones    2 big stones in left kidney now   History of recurrent UTIs    last uti 3 weeks ago   Hyperlipidemia    Hypertension    IBS (irritable bowel syndrome)    Obesities, morbid (HCC)    Pneumonia 06/2016   PONV (postoperative nausea and vomiting) 2004   came back after hystectomy bowels locked up on life support for 1 month    Sinusitis    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 at [facility]   Umbilical hernia    Wound discharge    open  wound right leg going to wound center changes dressing qday with santyl cream quarter size wound    Past Surgical History:  Procedure Laterality Date   ABDOMINAL HYSTERECTOMY     1 ovary left   CARPAL TUNNEL RELEASE Right    CESAREAN SECTION     x 2   CHOLECYSTECTOMY     COLONOSCOPY WITH PROPOFOL N/A 12/03/2016   Procedure: COLONOSCOPY WITH PROPOFOL;  Surgeon: Doran Stabler, MD;  Location: [facility];  Service: Gastroenterology;  Laterality: N/A;   CYSTECTOMY     left foot   CYSTOSCOPY/URETEROSCOPY/HOLMIUM LASER/STENT PLACEMENT Left 03/18/2020   Procedure: CYSTOSCOPY/LEFT RETROGRADE PYELOGRAM, STENT PLACEMENT;  Surgeon: Ceasar Mons, MD;  Location: [facility];  Service: Urology;  Laterality: Left;   ESOPHAGOGASTRODUODENOSCOPY (EGD) WITH PROPOFOL N/A 12/03/2016   Procedure: ESOPHAGOGASTRODUODENOSCOPY (EGD) WITH PROPOFOL;  Surgeon: Doran Stabler, MD;  Location: [facility];  Service: Gastroenterology;  Laterality: N/A;   LITHOTRIPSY     MULTIPLE TOOTH EXTRACTIONS     URETEROSCOPY WITH HOLMIUM LASER LITHOTRIPSY Left 04/09/2020   Procedure: URETEROSCOPY WITH HOLMIUM LASER  LITHOTRIPSY/ STENT CHANGE;  Surgeon: Festus Aloe, MD;  Location: [facility];  Service: Urology;  Laterality: Left;     Current Outpatient Medications  Medication Sig Dispense Refill   acetaminophen (TYLENOL) 500 MG tablet Take 1,000 mg by mouth every 6 (six) hours as needed for mild pain.     ALPRAZolam (XANAX) 1 MG tablet Take 1 mg by mouth 2 (two) times daily as needed for anxiety.      aspirin EC 81 MG tablet Take 1 tablet (81 mg total) by mouth daily. Swallow whole. 90 tablet 3   benzonatate (TESSALON) 200 MG capsule Take 200 mg by mouth every 8 (eight) hours.     cetirizine (ZYRTEC) 10 MG tablet Take 10 mg by mouth at bedtime.     Cholecalciferol (VITAMIN D3) 1.25 MG (50000 UT) CAPS Take 1 tablet by mouth once a week.     cyclobenzaprine (FLEXERIL) 10 MG tablet Take 10 mg by mouth daily as needed  for muscle spasms.     fluconazole (DIFLUCAN) 150 MG tablet Take 150 mg by mouth once a week.     fluticasone (FLONASE) 50 MCG/ACT nasal spray Place 1 spray into both nostrils daily.     Fluticasone-Salmeterol (WIXELA INHUB) 250-50 MCG/DOSE AEPB Inhale 1 puff into the lungs 2 (two) times daily. 60 each 1   glucose blood (ONE TOUCH ULTRA TEST) test strip USE TO TEST BLOOD SUGAR 3 TIMES DAILY 100 each 3   lisinopril (ZESTRIL) 5 MG tablet Take 5 mg by mouth daily.     magnesium oxide (MAG-OX) 400 (240 Mg) MG tablet Take 1 tablet by mouth daily.     montelukast (SINGULAIR) 10 MG tablet Take 10 mg by mouth every evening.     nystatin (NYSTATIN) powder Apply topically 3 (three) times daily as needed. (Patient taking differently: Apply 1 application topically 3 (three) times daily as needed (chaffing).) 60 g 6   omeprazole (PRILOSEC) 20 MG capsule TAKE 1 CAPSULE (20 MG TOTAL) BY MOUTH DAILY BEFORE BREAKFAST. 90 capsule 2   OZEMPIC, 0.25 OR 0.5 MG/DOSE, 2 MG/1.5ML SOPN Inject 0.5 mg into the skin every Tuesday.     polyvinyl alcohol (LIQUIFILM TEARS) 1.4 % ophthalmic solution Place 1 drop into both eyes as needed for dry eyes.     pregabalin (LYRICA) 200 MG capsule TAKE 1 CAPSULE BY MOUTH THREE TIMES A DAY (Patient taking differently: Take 200 mg by mouth in the morning, at noon, and at bedtime.) 90 capsule 2   promethazine (PHENERGAN) 12.5 MG tablet Take 12.5 mg by mouth every 8 (eight) hours as needed for nausea/vomiting.     promethazine-dextromethorphan (PROMETHAZINE-DM) 6.25-15 MG/5ML syrup Take 5 mLs by mouth every 6 (six) hours as needed.     rosuvastatin (CRESTOR) 10 MG tablet Take 10 mg by mouth daily.     VENTOLIN HFA 108 (90 Base) MCG/ACT inhaler INHALE 2 PUFFS EVERY 6 (SIX) HOURS AS NEEDED INTO THE LUNGS FOR WHEEZING OR SHORTNESS OF BREATH. (Patient taking differently: Inhale 2 puffs into the lungs every 6 (six) hours as needed for wheezing or shortness of breath.) 18 Inhaler 4   No current  facility-administered medications for this visit.    Allergies:   Bee venom, Darvocet [propoxyphene n-acetaminophen], Fish allergy, Morphine and related, Penicillins, Tape, and Fentanyl    Social History:  The patient  reports that she has been smoking cigarettes. She started smoking about 38 years ago. She has a 33.00 pack-year smoking history. She has never used smokeless tobacco.  She reports that she does not drink alcohol and does not use drugs.   Family History:  The patient's family history includes Alcohol abuse in her brother; Cancer in her mother; Cirrhosis in her mother; Diabetes in her father; Heart failure in her father; Hypertension in her father.    ROS:  Please see the history of present illness.   Otherwise, review of systems are positive for none.   All other systems are reviewed and negative.    PHYSICAL EXAM: VS:  BP 122/60    Pulse 70    Ht 5\' 3"  (1.6 m)    Wt 274 lb 9.6 oz (124.6 kg)    SpO2 97%    BMI 48.64 kg/m  , BMI Body mass index is 48.64 kg/m. GEN: Well nourished, obese, in no acute distress HEENT: normal Neck: no JVD, carotid bruits, or masses Cardiac: RRR; no murmurs, rubs, or gallops,no edema  Respiratory:  clear to auscultation bilaterally, normal work of breathing GI: soft, nontender, nondistended, + BS MS: no deformity or atrophy Skin: warm and dry, no rash Neuro:  Strength and sensation are intact Psych: euthymic mood, full affect   EKG:  EKG is ordered today. The ekg ordered today demonstrates NSR rate 70, normal. I have personally reviewed and interpreted this study.    Recent Labs: 03/03/2021: ALT 16; BUN 11; Creatinine, Ser 0.85; Hemoglobin 12.5; Platelets 206; Potassium 4.9; Sodium 137  Dated 05/20/20: cholesterol 274, triglycerides 341, HDL 42. Non HDL 232.    Lipid Panel No results found for: CHOL, TRIG, HDL, CHOLHDL, VLDL, LDLCALC, LDLDIRECT    Wt Readings from Last 3 Encounters:  08/29/21 274 lb 9.6 oz (124.6 kg)  03/02/21 283  lb 15.2 oz (128.8 kg)  02/20/21 284 lb (128.8 kg)      Other studies Reviewed: Additional studies/ records that were reviewed today include:   CLINICAL DATA:  56 year old female current smoker with 32 pack-year history of smoking. Lung cancer screening examination.   EXAM: CT CHEST WITHOUT CONTRAST LOW-DOSE FOR LUNG CANCER SCREENING   TECHNIQUE: Multidetector CT imaging of the chest was performed following the standard protocol without IV contrast.   COMPARISON:  No priors.   FINDINGS: Cardiovascular: Heart size is normal. There is no significant pericardial fluid, thickening or pericardial calcification. There is aortic atherosclerosis, as well as atherosclerosis of the great vessels of the mediastinum and the coronary arteries, including calcified atherosclerotic plaque in the left main, left anterior descending, left circumflex and right coronary arteries. Calcifications of the aortic valve.   Mediastinum/Nodes: No pathologically enlarged mediastinal or hilar lymph nodes. Please note that accurate exclusion of hilar adenopathy is limited on noncontrast CT scans. Esophagus is unremarkable in appearance. No axillary lymphadenopathy.   Lungs/Pleura: No suspicious appearing pulmonary nodules or masses are noted. No acute consolidative airspace disease. No pleural effusions. Diffuse bronchial wall thickening with mild centrilobular and paraseptal emphysema.   Upper Abdomen: Aortic atherosclerosis.  Status post cholecystectomy.   Musculoskeletal: There are no aggressive appearing lytic or blastic lesions noted in the visualized portions of the skeleton.   IMPRESSION: 1. Lung-RADS 1S, negative. Continue annual screening with low-dose chest CT without contrast in 12 months. 2. The "S" modifier above refers to potentially clinically significant non lung cancer related findings. Specifically, there is aortic atherosclerosis, in addition to left main and 3 vessel coronary  artery disease. Please note that although the presence of coronary artery calcium documents the presence of coronary artery disease, the severity of this disease and  any potential stenosis cannot be assessed on this non-gated CT examination. Assessment for potential risk factor modification, dietary therapy or pharmacologic therapy may be warranted, if clinically indicated. 3. Mild diffuse bronchial wall thickening with mild centrilobular and paraseptal emphysema; imaging findings suggestive of underlying COPD.   Aortic Atherosclerosis (ICD10-I70.0) and Emphysema (ICD10-J43.9).     Electronically Signed   By: Vinnie Langton M.D.   On: 04/10/2021 21:36     ASSESSMENT AND PLAN:  1.  CAD with high degree of coronary calcification. Some chest pain noted. Activity is quite limited. I recommend a Lexiscan myoview to assess ischemic risk. If low risk would focus on risk factor modification. If high risk would need to consider cardiac cath. Compare with prior study in 2016. Recommend she take ASA 81 mg daily. 2. HLD. Request most recent labs to review. Goal LDL < 70. Currently on Crestor 10 mg daily 3. HTN controlled 4. DM with neuropathy. Per PCP 5. Tobacco abuse - counseled on importance of cessation 6. Obesity.   Current medicines are reviewed at length with the patient today.  The patient does not have concerns regarding medicines.  The following changes have been made:  start ASA 81 mg daily  Labs/ tests ordered today include:   Orders Placed This Encounter  Procedures   Cardiac Stress Test: Informed Consent Details: Physician/Practitioner Attestation; Transcribe to consent form and obtain patient signature   EKG 12-Lead         Disposition:   FU post above study  Signed, Jontez Redfield Martinique, MD  08/29/2021 9:57 AM    [facility] [address], [place], [place], [address] Phone [phone], Fax [phone]

## 2021-08-29 ENCOUNTER — Telehealth (HOSPITAL_COMMUNITY): Payer: Self-pay | Admitting: *Deleted

## 2021-08-29 ENCOUNTER — Encounter: Payer: Self-pay | Admitting: Cardiology

## 2021-08-29 ENCOUNTER — Ambulatory Visit (INDEPENDENT_AMBULATORY_CARE_PROVIDER_SITE_OTHER): Payer: Medicare Other | Admitting: Cardiology

## 2021-08-29 ENCOUNTER — Other Ambulatory Visit: Payer: Self-pay

## 2021-08-29 VITALS — BP 122/60 | HR 70 | Ht 63.0 in | Wt 274.6 lb

## 2021-08-29 DIAGNOSIS — Z720 Tobacco use: Secondary | ICD-10-CM

## 2021-08-29 DIAGNOSIS — E782 Mixed hyperlipidemia: Secondary | ICD-10-CM | POA: Diagnosis not present

## 2021-08-29 DIAGNOSIS — I10 Essential (primary) hypertension: Secondary | ICD-10-CM

## 2021-08-29 DIAGNOSIS — I25118 Atherosclerotic heart disease of native coronary artery with other forms of angina pectoris: Secondary | ICD-10-CM

## 2021-08-29 DIAGNOSIS — E1140 Type 2 diabetes mellitus with diabetic neuropathy, unspecified: Secondary | ICD-10-CM | POA: Diagnosis not present

## 2021-08-29 MED ORDER — ASPIRIN EC 81 MG PO TBEC: 81.0000 mg | DELAYED_RELEASE_TABLET | Freq: Every day | ORAL | 3 refills | Status: AC

## 2021-08-29 NOTE — Telephone Encounter (Signed)
Close encounter 

## 2021-08-29 NOTE — Patient Instructions (Addendum)
Medication Instructions:  Start taking Aspirin 81 mg daily  Continue same medications   Lab Work: None ordered   Testing/Procedures: Schedule Lexiscan Myoview   Follow-Up: At [facility], you and your health needs are our priority.  As part of our continuing mission to provide you with exceptional heart care, we have created designated Provider Care Teams.  These Care Teams include your primary Cardiologist (physician) and Advanced Practice Providers (APPs -  Physician Assistants and Nurse Practitioners) who all work together to provide you with the care you need, when you need it.  We recommend signing up for the patient portal called "MyChart".  Sign up information is provided on this After Visit Summary.  MyChart is used to connect with patients for Virtual Visits (Telemedicine).  Patients are able to view lab/test results, encounter notes, upcoming appointments, etc.  Non-urgent messages can be sent to your provider as well.   To learn more about what you can do with MyChart, go to [email or web address].      Your next appointment:  Schedule after Lexiscan    The format for your next appointment: Office     Provider:  Dr.Jordan

## 2021-09-02 ENCOUNTER — Other Ambulatory Visit: Payer: Self-pay

## 2021-09-02 ENCOUNTER — Encounter (HOSPITAL_COMMUNITY): Payer: Medicare Other

## 2021-09-02 ENCOUNTER — Ambulatory Visit (HOSPITAL_COMMUNITY)
Admission: RE | Admit: 2021-09-02 | Discharge: 2021-09-02 | Disposition: A | Discharge status: Home / Self Care | Payer: Medicare Other | Source: Ambulatory Visit | Attending: Cardiology | Admitting: Cardiology

## 2021-09-02 DIAGNOSIS — I25118 Atherosclerotic heart disease of native coronary artery with other forms of angina pectoris: Secondary | ICD-10-CM

## 2021-09-02 DIAGNOSIS — I10 Essential (primary) hypertension: Secondary | ICD-10-CM

## 2021-09-02 DIAGNOSIS — E1140 Type 2 diabetes mellitus with diabetic neuropathy, unspecified: Secondary | ICD-10-CM

## 2021-09-02 LAB — MYOCARDIAL PERFUSION IMAGING
Peak HR: 80 {beats}/min
Rest HR: 64 {beats}/min
Stress Nuclear Isotope Dose: 30.1 mCi

## 2021-09-02 MED ORDER — TECHNETIUM TC 99M TETROFOSMIN IV KIT
30.1000 | PACK | Freq: Once | INTRAVENOUS | Status: AC | PRN
  Administered 2021-09-02: 30.1 via INTRAVENOUS
  Filled 2021-09-02: qty 31

## 2021-09-02 MED ORDER — REGADENOSON 0.4 MG/5ML IV SOLN
0.4000 mg | Freq: Once | INTRAVENOUS | Status: AC
  Administered 2021-09-02: 0.4 mg via INTRAVENOUS

## 2021-09-02 NOTE — Progress Notes (Signed)
[email or web address]  

## 2021-09-03 ENCOUNTER — Ambulatory Visit (HOSPITAL_COMMUNITY)
Admission: RE | Admit: 2021-09-03 | Discharge: 2021-09-03 | Disposition: A | Discharge status: Home / Self Care | Payer: Medicare Other | Source: Ambulatory Visit | Attending: Cardiovascular Disease | Admitting: Cardiovascular Disease

## 2021-09-03 MED ORDER — TECHNETIUM TC 99M TETROFOSMIN IV KIT
32.5000 | PACK | Freq: Once | INTRAVENOUS | Status: AC | PRN
  Administered 2021-09-03: 32.5 via INTRAVENOUS

## 2021-09-04 LAB — MYOCARDIAL PERFUSION IMAGING
LV dias vol: 137 mL (ref 46–106)
LV sys vol: 62 mL
Nuc Stress EF: 55 %
Rest Nuclear Isotope Dose: 32.5 mCi
SDS: 3
SRS: 8
SSS: 11
ST Depression (mm): 0 mm
Stress Nuclear Isotope Dose: 30.1 mCi
TID: 0.92

## 2021-10-06 DIAGNOSIS — N329 Bladder disorder, unspecified: Secondary | ICD-10-CM | POA: Diagnosis not present

## 2021-10-06 DIAGNOSIS — R3121 Asymptomatic microscopic hematuria: Secondary | ICD-10-CM | POA: Diagnosis not present

## 2021-10-15 ENCOUNTER — Other Ambulatory Visit: Payer: Self-pay | Admitting: Urology

## 2021-10-20 NOTE — Progress Notes (Signed)
COVID swab appointment: n/a  COVID Vaccine Completed: Date COVID Vaccine completed: Has received booster: COVID vaccine manufacturer: [employer]   Date of COVID positive in last 90 days:  PCP - Heide Scales, NP Cardiologist -   Chest x-ray - CT 04/09/21 Epic EKG - 08/29/21 Epic Stress Test - 09/03/21 Epic ECHO - 07/24/16 Epic Cardiac Cath -  Pacemaker/ICD device last checked: Spinal Cord Stimulator:  Bowel Prep -   Sleep Study -  CPAP -   Fasting Blood Sugar -  Checks Blood Sugar _____ times a day  Blood Thinner Instructions: Aspirin Instructions: ASA 81 Last Dose:  Activity level:  Can go up a flight of stairs and perform activities of daily living without stopping and without symptoms of chest pain or shortness of breath.   Able to exercise without symptoms  Unable to go up a flight of stairs without symptoms of      Anesthesia review: HTN, asthma, DM2, PONV  Patient denies shortness of breath, fever, cough and chest pain at PAT appointment   Patient verbalized understanding of instructions that were given to them at the PAT appointment. Patient was also instructed that they will need to review over the PAT instructions again at home before surgery.

## 2021-10-20 NOTE — Patient Instructions (Signed)
DUE TO COVID-19 ONLY ONE VISITOR  (aged 58 and older)  IS ALLOWED TO COME WITH YOU AND STAY IN THE WAITING ROOM ONLY DURING PRE OP AND PROCEDURE.   **NO VISITORS ARE ALLOWED IN THE SHORT STAY AREA OR RECOVERY ROOM!!**       Your procedure is scheduled on: 11/04/21   Report to [facility] Main Entrance    Report to admitting at 9:15 AM   Call this number if you have problems the morning of surgery [phone]   Do not eat food :After Midnight.   After Midnight you may have the following liquids until 8:30 AM DAY OF SURGERY  Water Black Coffee (sugar ok, NO MILK/CREAM OR CREAMERS)  Tea (sugar ok, NO MILK/CREAM OR CREAMERS) regular and decaf                             Plain Jell-O (NO RED)                                           Fruit ices (not with fruit pulp, NO RED)                                     Popsicles (NO RED)                                                                  Juice: apple, WHITE grape, WHITE cranberry Sports drinks like Gatorade (NO RED) Clear broth(vegetable,chicken,beef)  FOLLOW BOWEL PREP AND ANY ADDITIONAL PRE OP INSTRUCTIONS YOU RECEIVED FROM YOUR SURGEON'S OFFICE!!!     Oral Hygiene is also important to reduce your risk of infection.                                    Remember - BRUSH YOUR TEETH THE MORNING OF SURGERY WITH YOUR REGULAR TOOTHPASTE   Do NOT smoke after Midnight   Take these medicines the morning of surgery with A SIP OF WATER: Tylenol, Xanax, Zyrtec, Omeprazole, Lyrica, Rosuvastatin.  DO NOT TAKE ANY ORAL DIABETIC MEDICATIONS DAY OF YOUR SURGERY  How to Manage Your Diabetes Before and After Surgery  Why is it important to control my blood sugar before and after surgery? Improving blood sugar levels before and after surgery helps healing and can limit problems. A way of improving blood sugar control is eating a healthy diet by:  Eating less sugar and carbohydrates  Increasing activity/exercise  Talking with your  doctor about reaching your blood sugar goals High blood sugars (greater than 180 mg/dL) can raise your risk of infections and slow your recovery, so you will need to focus on controlling your diabetes during the weeks before surgery. Make sure that the doctor who takes care of your diabetes knows about your planned surgery including the date and location.  How do I manage my blood sugar before surgery? Check your blood sugar at least 4 times a day, starting 2 days before  surgery, to make sure that the level is not too high or low. Check your blood sugar the morning of your surgery when you wake up and every 2 hours until you get to the Short Stay unit. If your blood sugar is less than 70 mg/dL, you will need to treat for low blood sugar: Do not take insulin. Treat a low blood sugar (less than 70 mg/dL) with  cup of clear juice (cranberry or apple), 4 glucose tablets, OR glucose gel. Recheck blood sugar in 15 minutes after treatment (to make sure it is greater than 70 mg/dL). If your blood sugar is not greater than 70 mg/dL on recheck, call [phone] for further instructions. Report your blood sugar to the short stay nurse when you get to Short Stay.  If you are admitted to the hospital after surgery: Your blood sugar will be checked by the staff and you will probably be given insulin after surgery (instead of oral diabetes medicines) to make sure you have good blood sugar levels. The goal for blood sugar control after surgery is 80-180 mg/dL.   WHAT DO I DO ABOUT MY DIABETES MEDICATION?  Do not take oral diabetes medicines (pills) the morning of surgery.  THE NIGHT BEFORE SURGERY, take     units of       insulin.       THE MORNING OF SURGERY, take   units of         insulin.  The day of surgery, do not take other diabetes injectables, including Byetta (exenatide), Bydureon (exenatide ER), Victoza (liraglutide), or Trulicity (dulaglutide).  If your CBG is greater than 220 mg/dL, you  may take  of your sliding scale  (correction) dose of insulin.    For patients with insulin pumps: Contact your diabetes doctor for specific instructions before surgery. Decrease basal rates by 20% at midnight the night before your surgery. Note that if your surgery is planned to be longer than 2 hours, your insulin pump will be removed and intravenous (IV) insulin will be started and managed by the nurses and the anesthesiologist. You will be able to restart your insulin pump once you are awake and able to manage it.  Make sure to bring insulin pump supplies to the hospital with you in case the  site needs to be changed.  Patient Signature:  Date:   Nurse Signature:  Date:   Reviewed and Endorsed by [facility] Patient Education Committee, August 2015   Bring CPAP mask and tubing day of surgery.                              You may not have any metal on your body including hair pins, jewelry, and body piercing             Do not wear make-up, lotions, powders, perfumes, or deodorant  Do not wear nail polish including gel and S&S, artificial/acrylic nails, or any other type of covering on natural nails including finger and toenails. If you have artificial nails, gel coating, etc. that needs to be removed by a nail salon please have this removed prior to surgery or surgery may need to be canceled/ delayed if the surgeon/ anesthesia feels like they are unable to be safely monitored.   Do not shave  48 hours prior to surgery.    Do not bring valuables to the hospital. [facility] IS NOT  RESPONSIBLE   FOR VALUABLES.   Contacts, dentures or bridgework may not be worn into surgery.    Patients discharged on the day of surgery will not be allowed to drive home.  Someone NEEDS to stay with you for the first 24 hours after anesthesia.   Special Instructions: Bring a copy of your healthcare power of attorney and living will documents         the day of surgery if you haven't  scanned them before.              Please read over the following fact sheets you were given: IF YOU HAVE QUESTIONS ABOUT YOUR PRE-OP INSTRUCTIONS PLEASE CALL [facility] - Preparing for Surgery Before surgery, you can play an important role.  Because skin is not sterile, your skin needs to be as free of germs as possible.  You can reduce the number of germs on your skin by washing with CHG (chlorahexidine gluconate) soap before surgery.  CHG is an antiseptic cleaner which kills germs and bonds with the skin to continue killing germs even after washing. Please DO NOT use if you have an allergy to CHG or antibacterial soaps.  If your skin becomes reddened/irritated stop using the CHG and inform your nurse when you arrive at Short Stay. Do not shave (including legs and underarms) for at least 48 hours prior to the first CHG shower.  You may shave your face/neck.  Please follow these instructions carefully:  1.  Shower with CHG Soap the night before surgery and the  morning of surgery.  2.  If you choose to wash your hair, wash your hair first as usual with your normal  shampoo.  3.  After you shampoo, rinse your hair and body thoroughly to remove the shampoo.                             4.  Use CHG as you would any other liquid soap.  You can apply chg directly to the skin and wash.  Gently with a scrungie or clean washcloth.  5.  Apply the CHG Soap to your body ONLY FROM THE NECK DOWN.   Do   not use on face/ open                           Wound or open sores. Avoid contact with eyes, ears mouth and   genitals (private parts).                       Wash face,  Genitals (private parts) with your normal soap.             6.  Wash thoroughly, paying special attention to the area where your    surgery  will be performed.  7.  Thoroughly rinse your body with warm water from the neck down.  8.  DO NOT shower/wash with your normal soap after using and rinsing off the CHG Soap.                 9.  Pat yourself dry with a clean towel.            10.  Wear clean pajamas.            11.  Place clean sheets on your bed the night of your first shower  and do not  sleep with pets. Day of Surgery : Do not apply any lotions/deodorants the morning of surgery.  Please wear clean clothes to the hospital/surgery center.  FAILURE TO FOLLOW THESE INSTRUCTIONS MAY RESULT IN THE CANCELLATION OF YOUR SURGERY  PATIENT SIGNATURE_________________________________  NURSE SIGNATURE__________________________________  ________________________________________________________________________

## 2021-10-21 ENCOUNTER — Encounter (HOSPITAL_COMMUNITY)
Admission: RE | Admit: 2021-10-21 | Discharge: 2021-10-21 | Disposition: A | Discharge status: Home / Self Care | Payer: Medicare Other | Source: Ambulatory Visit | Attending: Urology | Admitting: Urology

## 2021-10-21 ENCOUNTER — Encounter (HOSPITAL_COMMUNITY): Payer: Self-pay

## 2021-10-21 ENCOUNTER — Other Ambulatory Visit: Payer: Self-pay

## 2021-10-21 VITALS — BP 168/75 | HR 64 | Temp 98.3°F | Resp 18 | Ht 63.0 in | Wt 281.1 lb

## 2021-10-21 DIAGNOSIS — E1140 Type 2 diabetes mellitus with diabetic neuropathy, unspecified: Secondary | ICD-10-CM | POA: Diagnosis not present

## 2021-10-21 DIAGNOSIS — Z01812 Encounter for preprocedural laboratory examination: Secondary | ICD-10-CM | POA: Diagnosis not present

## 2021-10-21 LAB — CBC
HCT: 41.1 % (ref 36.0–46.0)
Hemoglobin: 13 g/dL (ref 12.0–15.0)
MCH: 28.4 pg (ref 26.0–34.0)
MCHC: 31.6 g/dL (ref 30.0–36.0)
MCV: 89.7 fL (ref 80.0–100.0)
Platelets: 185 10*3/uL (ref 150–400)
RBC: 4.58 MIL/uL (ref 3.87–5.11)
RDW: 15.9 % — ABNORMAL HIGH (ref 11.5–15.5)
WBC: 5.5 10*3/uL (ref 4.0–10.5)
nRBC: 0 % (ref 0.0–0.2)

## 2021-10-21 LAB — BASIC METABOLIC PANEL
Anion gap: 9 (ref 5–15)
BUN: 17 mg/dL (ref 6–20)
CO2: 26 mmol/L (ref 22–32)
Calcium: 9.1 mg/dL (ref 8.9–10.3)
Chloride: 104 mmol/L (ref 98–111)
Creatinine, Ser: 0.74 mg/dL (ref 0.44–1.00)
GFR, Estimated: >60 mL/min (ref >60)
Glucose, Bld: 167 mg/dL — ABNORMAL HIGH (ref 70–99)
Potassium: 4.6 mmol/L (ref 3.5–5.1)
Sodium: 139 mmol/L (ref 135–145)

## 2021-10-21 LAB — GLUCOSE, CAPILLARY: Glucose-Capillary: 181 mg/dL — ABNORMAL HIGH (ref 70–99)

## 2021-10-22 LAB — HEMOGLOBIN A1C
Hgb A1c MFr Bld: 7.5 % — ABNORMAL HIGH (ref 4.8–5.6)
Mean Plasma Glucose: 169 mg/dL

## 2021-10-27 ENCOUNTER — Encounter (HOSPITAL_COMMUNITY): Payer: Self-pay | Admitting: Physician Assistant

## 2021-11-02 ENCOUNTER — Inpatient Hospital Stay (HOSPITAL_COMMUNITY)
Admission: EM | Admit: 2021-11-02 | Discharge: 2021-11-05 | DRG: 190 | Disposition: A | Discharge status: Home / Self Care | Payer: Medicare Other | Attending: Family Medicine | Admitting: Family Medicine

## 2021-11-02 ENCOUNTER — Emergency Department (HOSPITAL_COMMUNITY): Payer: Medicare Other

## 2021-11-02 ENCOUNTER — Other Ambulatory Visit: Payer: Self-pay

## 2021-11-02 ENCOUNTER — Encounter (HOSPITAL_COMMUNITY): Payer: Self-pay | Admitting: Emergency Medicine

## 2021-11-02 DIAGNOSIS — Q631 Lobulated, fused and horseshoe kidney: Secondary | ICD-10-CM | POA: Diagnosis not present

## 2021-11-02 DIAGNOSIS — Z90710 Acquired absence of both cervix and uterus: Secondary | ICD-10-CM

## 2021-11-02 DIAGNOSIS — Z79899 Other long term (current) drug therapy: Secondary | ICD-10-CM

## 2021-11-02 DIAGNOSIS — Z9079 Acquired absence of other genital organ(s): Secondary | ICD-10-CM

## 2021-11-02 DIAGNOSIS — E875 Hyperkalemia: Secondary | ICD-10-CM | POA: Diagnosis not present

## 2021-11-02 DIAGNOSIS — M797 Fibromyalgia: Secondary | ICD-10-CM | POA: Diagnosis present

## 2021-11-02 DIAGNOSIS — G8929 Other chronic pain: Secondary | ICD-10-CM | POA: Diagnosis not present

## 2021-11-02 DIAGNOSIS — R059 Cough, unspecified: Secondary | ICD-10-CM | POA: Diagnosis not present

## 2021-11-02 DIAGNOSIS — N200 Calculus of kidney: Secondary | ICD-10-CM | POA: Diagnosis not present

## 2021-11-02 DIAGNOSIS — I700 Atherosclerosis of aorta: Secondary | ICD-10-CM | POA: Diagnosis not present

## 2021-11-02 DIAGNOSIS — E1165 Type 2 diabetes mellitus with hyperglycemia: Secondary | ICD-10-CM | POA: Diagnosis not present

## 2021-11-02 DIAGNOSIS — Z87440 Personal history of urinary (tract) infections: Secondary | ICD-10-CM

## 2021-11-02 DIAGNOSIS — I10 Essential (primary) hypertension: Secondary | ICD-10-CM | POA: Diagnosis present

## 2021-11-02 DIAGNOSIS — K219 Gastro-esophageal reflux disease without esophagitis: Secondary | ICD-10-CM | POA: Diagnosis not present

## 2021-11-02 DIAGNOSIS — Z7951 Long term (current) use of inhaled steroids: Secondary | ICD-10-CM | POA: Diagnosis not present

## 2021-11-02 DIAGNOSIS — F17210 Nicotine dependence, cigarettes, uncomplicated: Secondary | ICD-10-CM | POA: Diagnosis not present

## 2021-11-02 DIAGNOSIS — Z91048 Other nonmedicinal substance allergy status: Secondary | ICD-10-CM

## 2021-11-02 DIAGNOSIS — J45901 Unspecified asthma with (acute) exacerbation: Principal | ICD-10-CM

## 2021-11-02 DIAGNOSIS — M1990 Unspecified osteoarthritis, unspecified site: Secondary | ICD-10-CM | POA: Diagnosis present

## 2021-11-02 DIAGNOSIS — Z6841 Body Mass Index (BMI) 40.0 and over, adult: Secondary | ICD-10-CM

## 2021-11-02 DIAGNOSIS — Z7982 Long term (current) use of aspirin: Secondary | ICD-10-CM

## 2021-11-02 DIAGNOSIS — Z7984 Long term (current) use of oral hypoglycemic drugs: Secondary | ICD-10-CM

## 2021-11-02 DIAGNOSIS — Z7985 Long-term (current) use of injectable non-insulin antidiabetic drugs: Secondary | ICD-10-CM | POA: Diagnosis not present

## 2021-11-02 DIAGNOSIS — I2510 Atherosclerotic heart disease of native coronary artery without angina pectoris: Secondary | ICD-10-CM | POA: Diagnosis present

## 2021-11-02 DIAGNOSIS — J9601 Acute respiratory failure with hypoxia: Secondary | ICD-10-CM | POA: Diagnosis not present

## 2021-11-02 DIAGNOSIS — F419 Anxiety disorder, unspecified: Secondary | ICD-10-CM | POA: Diagnosis present

## 2021-11-02 DIAGNOSIS — Z794 Long term (current) use of insulin: Secondary | ICD-10-CM | POA: Diagnosis not present

## 2021-11-02 DIAGNOSIS — Z720 Tobacco use: Secondary | ICD-10-CM | POA: Diagnosis present

## 2021-11-02 DIAGNOSIS — Z885 Allergy status to narcotic agent status: Secondary | ICD-10-CM

## 2021-11-02 DIAGNOSIS — Z9049 Acquired absence of other specified parts of digestive tract: Secondary | ICD-10-CM

## 2021-11-02 DIAGNOSIS — E785 Hyperlipidemia, unspecified: Secondary | ICD-10-CM | POA: Diagnosis present

## 2021-11-02 DIAGNOSIS — R109 Unspecified abdominal pain: Secondary | ICD-10-CM

## 2021-11-02 DIAGNOSIS — Z20822 Contact with and (suspected) exposure to covid-19: Secondary | ICD-10-CM | POA: Diagnosis present

## 2021-11-02 DIAGNOSIS — Z716 Tobacco abuse counseling: Secondary | ICD-10-CM

## 2021-11-02 DIAGNOSIS — R1032 Left lower quadrant pain: Secondary | ICD-10-CM | POA: Diagnosis not present

## 2021-11-02 DIAGNOSIS — R0602 Shortness of breath: Secondary | ICD-10-CM | POA: Diagnosis not present

## 2021-11-02 DIAGNOSIS — Z90721 Acquired absence of ovaries, unilateral: Secondary | ICD-10-CM

## 2021-11-02 DIAGNOSIS — Z91030 Bee allergy status: Secondary | ICD-10-CM

## 2021-11-02 DIAGNOSIS — J441 Chronic obstructive pulmonary disease with (acute) exacerbation: Secondary | ICD-10-CM | POA: Diagnosis not present

## 2021-11-02 DIAGNOSIS — Z8249 Family history of ischemic heart disease and other diseases of the circulatory system: Secondary | ICD-10-CM

## 2021-11-02 DIAGNOSIS — Z91013 Allergy to seafood: Secondary | ICD-10-CM

## 2021-11-02 DIAGNOSIS — K429 Umbilical hernia without obstruction or gangrene: Secondary | ICD-10-CM | POA: Diagnosis not present

## 2021-11-02 DIAGNOSIS — Z880 Allergy status to penicillin: Secondary | ICD-10-CM

## 2021-11-02 LAB — BASIC METABOLIC PANEL
Anion gap: 9 (ref 5–15)
BUN: 21 mg/dL — ABNORMAL HIGH (ref 6–20)
CO2: 27 mmol/L (ref 22–32)
Calcium: 9.9 mg/dL (ref 8.9–10.3)
Chloride: 100 mmol/L (ref 98–111)
Creatinine, Ser: 0.73 mg/dL (ref 0.44–1.00)
GFR, Estimated: >60 mL/min (ref >60)
Glucose, Bld: 250 mg/dL — ABNORMAL HIGH (ref 70–99)
Potassium: 5.2 mmol/L — ABNORMAL HIGH (ref 3.5–5.1)
Sodium: 136 mmol/L (ref 135–145)

## 2021-11-02 LAB — URINALYSIS, ROUTINE W REFLEX MICROSCOPIC
Bilirubin Urine: NEGATIVE
Glucose, UA: 250 mg/dL — AB
Ketones, ur: NEGATIVE mg/dL
Nitrite: NEGATIVE
Protein, ur: 100 mg/dL — AB
Specific Gravity, Urine: 1.025 (ref 1.005–1.030)
pH: 6.5 (ref 5.0–8.0)

## 2021-11-02 LAB — CBC WITH DIFFERENTIAL/PLATELET
Abs Immature Granulocytes: 0.01 10*3/uL (ref 0.00–0.07)
Basophils Absolute: 0 10*3/uL (ref 0.0–0.1)
Basophils Relative: 1 %
Eosinophils Absolute: 0.2 10*3/uL (ref 0.0–0.5)
Eosinophils Relative: 4 %
HCT: 41.3 % (ref 36.0–46.0)
Hemoglobin: 13.4 g/dL (ref 12.0–15.0)
Immature Granulocytes: 0 %
Lymphocytes Relative: 26 %
Lymphs Abs: 1.2 10*3/uL (ref 0.7–4.0)
MCH: 28.5 pg (ref 26.0–34.0)
MCHC: 32.4 g/dL (ref 30.0–36.0)
MCV: 87.9 fL (ref 80.0–100.0)
Monocytes Absolute: 0.4 10*3/uL (ref 0.1–1.0)
Monocytes Relative: 9 %
Neutro Abs: 2.8 10*3/uL (ref 1.7–7.7)
Neutrophils Relative %: 60 %
Platelets: 191 10*3/uL (ref 150–400)
RBC: 4.7 MIL/uL (ref 3.87–5.11)
RDW: 15.9 % — ABNORMAL HIGH (ref 11.5–15.5)
WBC: 4.5 10*3/uL (ref 4.0–10.5)
nRBC: 0 % (ref 0.0–0.2)

## 2021-11-02 LAB — GLUCOSE, CAPILLARY
Glucose-Capillary: 283 mg/dL — ABNORMAL HIGH (ref 70–99)
Glucose-Capillary: 426 mg/dL — ABNORMAL HIGH (ref 70–99)

## 2021-11-02 LAB — URINALYSIS, MICROSCOPIC (REFLEX)

## 2021-11-02 LAB — RESP PANEL BY RT-PCR (FLU A&B, COVID) ARPGX2
Influenza A by PCR: NEGATIVE
Influenza B by PCR: NEGATIVE
SARS Coronavirus 2 by RT PCR: NEGATIVE

## 2021-11-02 LAB — MAGNESIUM: Magnesium: 1.9 mg/dL (ref 1.7–2.4)

## 2021-11-02 LAB — GLUCOSE, RANDOM: Glucose, Bld: 402 mg/dL — ABNORMAL HIGH (ref 70–99)

## 2021-11-02 MED ORDER — OXYCODONE HCL 5 MG PO TABS
5.0000 mg | ORAL_TABLET | ORAL | Status: DC | PRN
  Administered 2021-11-02 – 2021-11-05 (×10): 5 mg via ORAL
  Filled 2021-11-02 (×10): qty 1

## 2021-11-02 MED ORDER — IPRATROPIUM BROMIDE 0.02 % IN SOLN: 0.5000 mg | Freq: Four times a day (QID) | RESPIRATORY_TRACT | Status: DC

## 2021-11-02 MED ORDER — MAGNESIUM SULFATE 2 GM/50ML IV SOLN
2.0000 g | Freq: Once | INTRAVENOUS | Status: AC
  Administered 2021-11-02: 2 g via INTRAVENOUS
  Filled 2021-11-02: qty 50

## 2021-11-02 MED ORDER — ASPIRIN EC 81 MG PO TBEC: 81.0000 mg | DELAYED_RELEASE_TABLET | Freq: Every day | ORAL | Status: DC

## 2021-11-02 MED ORDER — BENZONATATE 100 MG PO CAPS
200.0000 mg | ORAL_CAPSULE | Freq: Once | ORAL | Status: AC
  Administered 2021-11-02: 200 mg via ORAL
  Filled 2021-11-02: qty 2

## 2021-11-02 MED ORDER — GUAIFENESIN-DM 100-10 MG/5ML PO SYRP
5.0000 mL | ORAL_SOLUTION | ORAL | Status: DC | PRN
  Administered 2021-11-02 – 2021-11-05 (×7): 5 mL via ORAL
  Filled 2021-11-02 (×7): qty 10

## 2021-11-02 MED ORDER — SODIUM CHLORIDE 0.9 % IV SOLN
25.0000 mg | Freq: Once | INTRAVENOUS | Status: AC
  Administered 2021-11-02: 25 mg via INTRAVENOUS
  Filled 2021-11-02: qty 1

## 2021-11-02 MED ORDER — PREDNISONE 20 MG PO TABS
60.0000 mg | ORAL_TABLET | Freq: Once | ORAL | Status: AC
  Administered 2021-11-02: 60 mg via ORAL
  Filled 2021-11-02: qty 3

## 2021-11-02 MED ORDER — ACETAMINOPHEN 650 MG RE SUPP
650.0000 mg | Freq: Four times a day (QID) | RECTAL | Status: DC | PRN
Start: 2021-11-02 — End: 2021-11-05

## 2021-11-02 MED ORDER — ALPRAZOLAM 0.5 MG PO TABS
1.0000 mg | ORAL_TABLET | Freq: Two times a day (BID) | ORAL | Status: DC | PRN
  Administered 2021-11-03: 1 mg via ORAL
  Filled 2021-11-02: qty 2

## 2021-11-02 MED ORDER — SODIUM CHLORIDE 0.9 % IV BOLUS
500.0000 mL | Freq: Once | INTRAVENOUS | Status: AC
  Administered 2021-11-02: 500 mL via INTRAVENOUS

## 2021-11-02 MED ORDER — ALBUTEROL (5 MG/ML) CONTINUOUS INHALATION SOLN: 10.0000 mg/h | INHALATION_SOLUTION | Freq: Once | RESPIRATORY_TRACT | Status: DC

## 2021-11-02 MED ORDER — ACETAMINOPHEN 325 MG PO TABS
650.0000 mg | ORAL_TABLET | Freq: Four times a day (QID) | ORAL | Status: DC | PRN
Start: 2021-11-02 — End: 2021-11-05
  Administered 2021-11-03: 650 mg via ORAL
  Filled 2021-11-02: qty 2

## 2021-11-02 MED ORDER — SODIUM CHLORIDE 0.45 % IV SOLN: INTRAVENOUS | Status: DC

## 2021-11-02 MED ORDER — ENOXAPARIN SODIUM 60 MG/0.6ML IJ SOSY
60.0000 mg | PREFILLED_SYRINGE | INTRAMUSCULAR | Status: DC
  Administered 2021-11-02 – 2021-11-04 (×3): 60 mg via SUBCUTANEOUS
  Filled 2021-11-02 (×3): qty 0.6

## 2021-11-02 MED ORDER — PANTOPRAZOLE SODIUM 40 MG PO TBEC
40.0000 mg | DELAYED_RELEASE_TABLET | Freq: Every day | ORAL | Status: DC
  Administered 2021-11-03 – 2021-11-05 (×3): 40 mg via ORAL
  Filled 2021-11-02 (×3): qty 1

## 2021-11-02 MED ORDER — LISINOPRIL 5 MG PO TABS
5.0000 mg | ORAL_TABLET | Freq: Every day | ORAL | Status: DC
  Administered 2021-11-03 – 2021-11-05 (×3): 5 mg via ORAL
  Filled 2021-11-02 (×3): qty 1

## 2021-11-02 MED ORDER — SODIUM CHLORIDE 0.45 % IV SOLN
INTRAVENOUS | Status: DC
Start: 2021-11-02 — End: 2021-11-02

## 2021-11-02 MED ORDER — IPRATROPIUM-ALBUTEROL 0.5-2.5 (3) MG/3ML IN SOLN
3.0000 mL | Freq: Once | RESPIRATORY_TRACT | Status: AC
  Administered 2021-11-02: 3 mL via RESPIRATORY_TRACT
  Filled 2021-11-02: qty 3

## 2021-11-02 MED ORDER — ALBUTEROL SULFATE (2.5 MG/3ML) 0.083% IN NEBU: 2.5000 mg | INHALATION_SOLUTION | Freq: Four times a day (QID) | RESPIRATORY_TRACT | Status: DC

## 2021-11-02 MED ORDER — ROSUVASTATIN CALCIUM 20 MG PO TABS
10.0000 mg | ORAL_TABLET | Freq: Every day | ORAL | Status: DC
Start: 2021-11-03 — End: 2021-11-05
  Administered 2021-11-03 – 2021-11-05 (×3): 10 mg via ORAL
  Filled 2021-11-02 (×3): qty 1

## 2021-11-02 MED ORDER — ACETAMINOPHEN 500 MG PO TABS
1000.0000 mg | ORAL_TABLET | Freq: Once | ORAL | Status: AC
  Administered 2021-11-02: 1000 mg via ORAL
  Filled 2021-11-02: qty 2

## 2021-11-02 MED ORDER — PREDNISONE 20 MG PO TABS
40.0000 mg | ORAL_TABLET | Freq: Every day | ORAL | Status: DC
  Administered 2021-11-03 – 2021-11-05 (×3): 40 mg via ORAL
  Filled 2021-11-02 (×3): qty 2

## 2021-11-02 MED ORDER — INSULIN ASPART 100 UNIT/ML IJ SOLN
0.0000 [IU] | INTRAMUSCULAR | Status: DC
  Administered 2021-11-02: 20 [IU] via SUBCUTANEOUS
  Administered 2021-11-03: 7 [IU] via SUBCUTANEOUS

## 2021-11-02 MED ORDER — INSULIN ASPART 100 UNIT/ML IJ SOLN
0.0000 [IU] | Freq: Three times a day (TID) | INTRAMUSCULAR | Status: DC
  Administered 2021-11-02: 11 [IU] via SUBCUTANEOUS

## 2021-11-02 MED ORDER — MONTELUKAST SODIUM 10 MG PO TABS
10.0000 mg | ORAL_TABLET | Freq: Every day | ORAL | Status: DC
  Administered 2021-11-02 – 2021-11-04 (×3): 10 mg via ORAL
  Filled 2021-11-02 (×3): qty 1

## 2021-11-02 MED ORDER — PROCHLORPERAZINE EDISYLATE 10 MG/2ML IJ SOLN
10.0000 mg | Freq: Four times a day (QID) | INTRAMUSCULAR | Status: DC | PRN
  Administered 2021-11-02 – 2021-11-05 (×9): 10 mg via INTRAVENOUS
  Filled 2021-11-02 (×9): qty 2

## 2021-11-02 MED ORDER — ALBUTEROL SULFATE (2.5 MG/3ML) 0.083% IN NEBU
2.5000 mg | INHALATION_SOLUTION | RESPIRATORY_TRACT | Status: DC | PRN
  Administered 2021-11-04: 2.5 mg via RESPIRATORY_TRACT
  Filled 2021-11-02: qty 3

## 2021-11-02 MED ORDER — HYDROMORPHONE HCL 1 MG/ML IJ SOLN
0.5000 mg | Freq: Once | INTRAMUSCULAR | Status: AC
  Administered 2021-11-02: 0.5 mg via INTRAVENOUS
  Filled 2021-11-02: qty 1

## 2021-11-02 MED ORDER — ONDANSETRON HCL 4 MG/2ML IJ SOLN
4.0000 mg | Freq: Once | INTRAMUSCULAR | Status: AC
  Administered 2021-11-02: 4 mg via INTRAVENOUS
  Filled 2021-11-02: qty 2

## 2021-11-02 MED ORDER — IPRATROPIUM-ALBUTEROL 0.5-2.5 (3) MG/3ML IN SOLN
3.0000 mL | Freq: Four times a day (QID) | RESPIRATORY_TRACT | Status: DC
  Administered 2021-11-02 – 2021-11-03 (×2): 3 mL via RESPIRATORY_TRACT
  Filled 2021-11-02 (×2): qty 3

## 2021-11-02 MED ORDER — ALBUTEROL SULFATE (2.5 MG/3ML) 0.083% IN NEBU
10.0000 mg | INHALATION_SOLUTION | Freq: Once | RESPIRATORY_TRACT | Status: AC
  Administered 2021-11-02: 10 mg via RESPIRATORY_TRACT
  Filled 2021-11-02: qty 12

## 2021-11-02 NOTE — ED Notes (Signed)
Patient ambulated to and from bathroom independently.

## 2021-11-02 NOTE — ED Provider Notes (Signed)
?[facility] ?Provider Note ? ? ?CSN: [id number] ?Arrival date & time: 11/02/21  0093 ? ?  ? ?History ? ?Chief Complaint  ?Patient presents with  ? Cough  ? ? ?Gina [name] is a 55 y.o. female. ? ?Gina [name] is a 56 year-old-female with a history of recurrent UTIs, pyelonephritis, kidney stones, fibromyalgia, HTN, T2DM, asthma, and approximately current 35 pack year smoking history who presents to the ED for complaints of cough/congestion and back pain/dysuria. Cough and congestion started 1 week ago and has been worsening in severity. She occasionally has post-tussive emesis and coughs up mucous. Patient states prescribed cough medications that have not helped, but nebulized Albuterol somewhat helped. She endorses measured fevers up to 101 F, rhinorrhea, headache, and shortness of breath. Denies chills, hematemesis, recent sickness, or sick contacts. Her abdominal pain and dysuria started 1 week ago. Pain is localized to LLQ with radiation to left lower back. Described as sharp and constant, similar to previous episodes of kidney stones and UTIs. Tylenol does not help the pain. She endorses associated hematuria and states she is scheduled to have her "kidney looked at for 3 spots" that were previously found by Dr. Junious Silk with Urology next week. ? ?The history is provided by the patient and medical records.  ? ?  ? ?Home Medications ?Prior to Admission medications   ?Medication Sig Start Date End Date Taking? Authorizing Provider  ?acetaminophen (TYLENOL) 500 MG tablet Take 1,000 mg by mouth every 6 (six) hours as needed for mild pain.    [provider]  ?ALPRAZolam Duanne Moron) 1 MG tablet Take 1 mg by mouth 2 (two) times daily as needed for anxiety.     [provider]  ?aspirin EC 81 MG tablet Take 1 tablet (81 mg total) by mouth daily. Swallow whole. 08/29/21   Martinique, Peter M, MD  ?cetirizine (ZYRTEC) 10 MG tablet Take 10 mg by mouth in the morning.     [provider]  ?cyclobenzaprine (FLEXERIL) 10 MG tablet Take 10 mg by mouth daily as needed for muscle spasms. 12/05/20   [provider]  ?fluticasone (FLONASE) 50 MCG/ACT nasal spray Place 1 spray into both nostrils daily as needed for allergies. 06/18/16   [provider]  ?Fluticasone-Salmeterol (WIXELA INHUB) 250-50 MCG/DOSE AEPB Inhale 1 puff into the lungs 2 (two) times daily. 12/22/18   [place], Betty G, MD  ?glucose blood (ONE TOUCH ULTRA TEST) test strip USE TO TEST BLOOD SUGAR 3 TIMES DAILY 06/16/18   [place], Betty G, MD  ?lisinopril (ZESTRIL) 5 MG tablet Take 5 mg by mouth daily.    [provider]  ?magnesium oxide (MAG-OX) 400 (240 Mg) MG tablet Take 400 mg by mouth daily. 07/30/21   [provider]  ?montelukast (SINGULAIR) 10 MG tablet Take 10 mg by mouth at bedtime. 12/06/20   [provider]  ?nystatin (NYSTATIN) powder Apply topically 3 (three) times daily as needed. ?Patient taking differently: Apply 1 application. topically 3 (three) times daily as needed (chaffing/rash). 01/17/18   [place], Betty G, MD  ?omeprazole (PRILOSEC) 20 MG capsule TAKE 1 CAPSULE (20 MG TOTAL) BY MOUTH DAILY BEFORE BREAKFAST. 07/03/19   Martin[place], Betty G, MD  ?[facility], 1 MG/DOSE, 4 MG/3ML SOPN Inject 2 mg into the skin every Friday. 06/19/21   [provider]  ?polyvinyl alcohol (LIQUIFILM TEARS) 1.4 % ophthalmic solution Place 1 drop into both eyes as needed for dry eyes.    [provider]  ?pregabalin (  LYRICA) 200 MG capsule TAKE 1 CAPSULE BY MOUTH THREE TIMES A DAY 01/17/18   [place], Betty G, MD  ?promethazine (PHENERGAN) 12.5 MG tablet Take 12.5 mg by mouth every 8 (eight) hours as needed for nausea/vomiting. 02/26/21   [provider]  ?rosuvastatin (CRESTOR) 10 MG tablet Take 10 mg by mouth daily. 07/11/21   [provider]  ?VENTOLIN HFA 108 (90 Base) MCG/ACT inhaler INHALE 2 PUFFS EVERY 6 (SIX) HOURS AS NEEDED INTO THE LUNGS FOR  WHEEZING OR SHORTNESS OF BREATH. 06/02/18   [place], Betty G, MD  ?Vitamin D, Ergocalciferol, (DRISDOL) 1.25 MG (50000 UNIT) CAPS capsule Take 50,000 Units by mouth every 7 (seven) days.    [provider]  ?insulin aspart (NOVOLOG) 100 UNIT/ML injection Inject 0-5 Units into the skin at bedtime. ?Patient not taking: Reported on 03/02/2021 03/26/20 03/03/21  Nita Sells, MD  ?metFORMIN (GLUCOPHAGE) 850 MG tablet TAKE 1 TABLET (850 MG TOTAL) BY MOUTH 2 (TWO) TIMES DAILY WITH A MEAL. ?Patient not taking: Reported on 03/02/2021 03/24/18 03/03/21  [place], Betty G, MD  ?opium-belladonna (B&O SUPPRETTES) 16.2-60 MG suppository Place 1 suppository rectally every 6 (six) hours. ?Patient not taking: Reported on 03/02/2021 03/26/20 03/03/21  Nita Sells, MD  ?potassium chloride SA (KLOR-CON) 20 MEQ tablet Take 2 tablets (40 mEq total) by mouth 2 (two) times daily. ?Patient not taking: Reported on 03/02/2021 03/26/20 03/03/21  Nita Sells, MD  ?   ? ?Allergies    ?Bee venom, Darvocet [propoxyphene n-acetaminophen], Fish allergy, Morphine and related, Penicillins, Tape, and Fentanyl   ? ?Review of Systems   ?Review of Systems  ?Constitutional:  Positive for chills and fever.  ?HENT:  Positive for congestion and rhinorrhea.   ?Respiratory:  Positive for cough, shortness of breath and wheezing.   ?Cardiovascular:  Negative for chest pain.  ?Gastrointestinal:  Positive for abdominal pain, nausea and vomiting.  ?Genitourinary:  Positive for dysuria, flank pain, frequency and hematuria.  ?Musculoskeletal:  Negative for arthralgias and myalgias.  ?Skin:  Negative for color change and rash.  ? ?Physical Exam ?Updated Vital Signs ?BP (!) 183/76   Pulse 80   Temp 98.1 ?F (36.7 ?C) (Oral)   Resp 20   SpO2 96%  ?Physical Exam ?Vitals and nursing note reviewed.  ?Constitutional:   ?   General: She is not in acute distress. ?   Appearance: Normal appearance. She is well-developed. She is obese. She is  ill-appearing. She is not diaphoretic.  ?   Comments: Patient appears uncomfortable, chronically ill-appearing but in no acute distress.  ?HENT:  ?   Head: Normocephalic and atraumatic.  ?   Nose: Congestion and rhinorrhea present.  ?   Mouth/Throat:  ?   Mouth: Mucous membranes are moist.  ?   Pharynx: Oropharynx is clear.  ?Eyes:  ?   General:     ?   Right eye: No discharge.     ?   Left eye: No discharge.  ?Cardiovascular:  ?   Rate and Rhythm: Normal rate and regular rhythm.  ?   Pulses: Normal pulses.  ?   Heart sounds: Normal heart sounds. No murmur heard. ?  No friction rub. No gallop.  ?Pulmonary:  ?   Effort: No respiratory distress.  ?   Breath sounds: Wheezing present. No rales.  ?   Comments: Patient is able to speak in full sentences, normal work of breathing at rest but with any movement patient becomes tachypneic, on auscultation she has significantly decreased  air movement throughout with faint wheezing in all lung fields.  Patient initially satting well on room air, sats ranging 94-96%, started to drop sats into the 80s and was placed on 2 L nasal cannula ?Abdominal:  ?   General: Bowel sounds are normal. There is no distension.  ?   Palpations: Abdomen is soft. There is no mass.  ?   Tenderness: There is abdominal tenderness. There is no guarding.  ?   Comments: Abdomen soft, nondistended, mild generalized tenderness to the abdomen, with more focal tenderness in the left lower, but no guarding or peritoneal signs, there is some flank tenderness over the left, none over the right  ?Musculoskeletal:     ?   General: No deformity.  ?   Cervical back: Neck supple.  ?   Comments: Patient reports diffuse back pain throughout but denies any focal areas of tenderness, no midline tenderness or overlying skin changes.  ?Skin: ?   General: Skin is warm and dry.  ?   Capillary Refill: Capillary refill takes less than 2 seconds.  ?Neurological:  ?   Mental Status: She is alert and oriented to person, place,  and time.  ?   Coordination: Coordination normal.  ?   Comments: Speech is clear, able to follow commands ?Moves extremities without ataxia, coordination intact  ?Psychiatric:     ?   Mood and Affect: Mood normal.     ?   Beh

## 2021-11-02 NOTE — ED Triage Notes (Signed)
Patient c/o cough and congestion with lower back pain and dysuria x1 week. ?

## 2021-11-02 NOTE — H&P (Signed)
?History and Physical  ? ? ?Patient: Gina [name] KTG:[id number] DOB: 05-15-1966 ?DOA: 11/02/2021 ?DOS: the patient was seen and examined on 11/02/2021 ?PCP: Imagene Riches, NP  ?Patient coming from: Home ? ?Chief Complaint:  ?Chief Complaint  ?Patient presents with  ? Cough  ? ?HPI: Gina [name] is a 56 y.o. female with medical history significant of anemia, anxiety, osteoarthritis, asthma/COPD, chronic back pain, fibromyalgia, type II DM, eosinophilia, GERD, migraine headaches, Oda lithiasis, history of recurrent UTIs, hyperlipidemia, hypertension, IBS, class III obesity, history of pneumonia, umbilical hernia, unspecified stomach tumor history of RLE chronic wound who is coming to the emergency department with complaints of progressively worse dyspnea associated with productive cough of whitish sputum, rhinorrhea, nasal congestion, sore throat, ear fullness, wheezing, pleuritic chest pain, fever of 101 ?F, chills, decreased appetite for the past 7 days after she was exposed to her granddaughter last weekend who was having viral symptoms.  Also for the last 3 days, the patient has had multiple episodes of emesis and diarrhea with mild left flank and LLQ abdominal pain.  Denied melena, hematochezia, hematuria, frequency, polyuria, polydipsia, polyphagia or blurred vision. ? ?ED course: Initial vital signs were temperature 98.1 ?F, pulse 80, respirations 20, BP 183/76 mmHg and O2 sat 96% on room air.  The patient received prednisone 60 mg p.o., benzonatate 200 mg p.o., hydromorphone 0.5 mg IVP DuoNebs x2, continuous 10 mg albuterol neb, saw from 4 mg IVP, promethazine 25 mg IVPB and 500 mL of NS bolus. ? ?Lab work: Her urinalysis showed glucosuria at 250 and proteinuria of 100 mg/dL, small hemoglobinuria, and small leukocyte esterase.  Rare bacteria on urine microscopic examination.  CBC with a white count of 4.5, hemoglobin 13.4 g/dL platelets 191.  BMP showed a potassium of 5.2 mmol/L, glucose of 250 and BUN  of 21 mg/dL.  All other values were normal.  Influenza and coronavirus PCR negative. ? ?Imaging: ATwo-view chest radiograph show aortic atherosclerosis but no evidence of acute cardiopulmonary disease.  CT renal protocol with no acute finding.  There were nonobstructing left lower pole renal calculi and chronic bilateral renal scaring.  Atherosclerosis of multiple vessels, including the coronary arteries. ? ?Review of Systems: As mentioned in the history of present illness. All other systems reviewed and are negative. ?Past Medical History:  ?Diagnosis Date  ? Anemia   ? Anxiety   ? Arthritis   ? Asthma   ? Back pain   ? Chronic pain   ? Diabetes mellitus   ? type 2  ? Dyspnea   ? with exersion   ? Eosinophilia 04/23/2014  ? Family history of adverse reaction to anesthesia   ? youngest daughter hard to wake up both daughters post op n/v  ? Fibromyalgia   ? GERD (gastroesophageal reflux disease)   ? Headache   ? migraines  ? History of kidney stones   ? 2 big stones in left kidney now  ? History of recurrent UTIs   ? last uti 3 weeks ago  ? Hyperlipidemia   ? Hypertension   ? IBS (irritable bowel syndrome)   ? Obesities, morbid ([place])   ? Pneumonia 06/2016  ? PONV (postoperative nausea and vomiting) 2004  ? came back after hystectomy bowels locked up on life support for 1 month   ? Sinusitis   ? Tumor cells   ? 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 at [place]  ? Umbilical hernia   ? Wound discharge   ? open  wound right leg going to wound center changes dressing qday with santyl cream quarter size wound  ? ?Past Surgical History:  ?Procedure Laterality Date  ? ABDOMINAL HYSTERECTOMY    ? 1 ovary left  ? CARPAL TUNNEL RELEASE Right   ? CESAREAN SECTION    ? x 2  ? CHOLECYSTECTOMY    ? COLONOSCOPY WITH PROPOFOL N/A 12/03/2016  ? Procedure: COLONOSCOPY WITH PROPOFOL;  Surgeon: Doran Stabler, MD;  Location: [facility];  Service: Gastroenterology;  Laterality: N/A;  ? CYSTECTOMY    ? left foot  ? CYSTOSCOPY/URETEROSCOPY/HOLMIUM  LASER/STENT PLACEMENT Left 03/18/2020  ? Procedure: CYSTOSCOPY/LEFT RETROGRADE PYELOGRAM, STENT PLACEMENT;  Surgeon: Ceasar Mons, MD;  Location: [facility];  Service: Urology;  Laterality: Left;  ? ESOPHAGOGASTRODUODENOSCOPY (EGD) WITH PROPOFOL N/A 12/03/2016  ? Procedure: ESOPHAGOGASTRODUODENOSCOPY (EGD) WITH PROPOFOL;  Surgeon: Doran Stabler, MD;  Location: [facility];  Service: Gastroenterology;  Laterality: N/A;  ? LITHOTRIPSY    ? MULTIPLE TOOTH EXTRACTIONS    ? URETEROSCOPY WITH HOLMIUM LASER LITHOTRIPSY Left 04/09/2020  ? Procedure: URETEROSCOPY WITH HOLMIUM LASER LITHOTRIPSY/ STENT CHANGE;  Surgeon: Festus Aloe, MD;  Location: [facility];  Service: Urology;  Laterality: Left;  ? ?Social History:  reports that she has been smoking cigarettes. She started smoking about 38 years ago. She has a 49.50 pack-year smoking history. She has never used smokeless tobacco. She reports that she does not drink alcohol and does not use drugs. ? ?Allergies  ?Allergen Reactions  ? Bee Venom Anaphylaxis  ? Darvocet [Propoxyphene N-Acetaminophen] Nausea And Vomiting  ? Fish Allergy Anaphylaxis  ? Morphine And Related Itching  ? Penicillins Hives, Swelling and Other (See Comments)  ?  Tolerates Rocephin ? ?Swelling all over body  ?Has patient had a PCN reaction causing immediate rash, facial/tongue/throat swelling, SOB or lightheadedness with hypotension: Yes ?Has patient had a PCN reaction causing severe rash involving mucus membranes or skin necrosis: Yes ?Has patient had a PCN reaction that required hospitalization Unknown ?Has patient had a PCN reaction occurring within the last 10 years: No  ?If all of the above answers are "NO", then may proceed with Cephalosporin use. ?  ? Tape Hives and Other (See Comments)  ?  Adhesive tape  ? Fentanyl Nausea Only  ? ? ?Family History  ?Problem Relation Age of Onset  ? Cancer Mother   ? Cirrhosis Mother   ? Heart failure Father   ? Diabetes Father   ? Hypertension Father    ? Alcohol abuse Brother   ? Colon cancer Neg Hx   ? ? ?Prior to Admission medications   ?Medication Sig Start Date End Date Taking? Authorizing Provider  ?acetaminophen (TYLENOL) 500 MG tablet Take 1,000 mg by mouth every 6 (six) hours as needed for mild pain.    [provider]  ?ALPRAZolam Duanne Moron) 1 MG tablet Take 1 mg by mouth 2 (two) times daily as needed for anxiety.     [provider]  ?aspirin EC 81 MG tablet Take 1 tablet (81 mg total) by mouth daily. Swallow whole. 08/29/21   [place], Peter M, MD  ?cetirizine (ZYRTEC) 10 MG tablet Take 10 mg by mouth in the morning.    [provider]  ?cyclobenzaprine (FLEXERIL) 10 MG tablet Take 10 mg by mouth daily as needed for muscle spasms. 12/05/20   [provider]  ?fluticasone (FLONASE) 50 MCG/ACT nasal spray Place 1 spray into both nostrils daily as needed for allergies. 06/18/16  [provider]  ?Fluticasone-Salmeterol (WIXELA INHUB) 250-50 MCG/DOSE AEPB Inhale 1 puff into the lungs 2 (two) times daily. 12/22/18   [place], Betty G, MD  ?glucose blood (ONE TOUCH ULTRA TEST) test strip USE TO TEST BLOOD SUGAR 3 TIMES DAILY 06/16/18   [place], Betty G, MD  ?lisinopril (ZESTRIL) 5 MG tablet Take 5 mg by mouth daily.    [provider]  ?magnesium oxide (MAG-OX) 400 (240 Mg) MG tablet Take 400 mg by mouth daily. 07/30/21   [provider]  ?montelukast (SINGULAIR) 10 MG tablet Take 10 mg by mouth at bedtime. 12/06/20   [provider]  ?nystatin (NYSTATIN) powder Apply topically 3 (three) times daily as needed. ?Patient taking differently: Apply 1 application. topically 3 (three) times daily as needed (chaffing/rash). 01/17/18   [place], Betty G, MD  ?omeprazole (PRILOSEC) 20 MG capsule TAKE 1 CAPSULE (20 MG TOTAL) BY MOUTH DAILY BEFORE BREAKFAST. 07/03/19   [place], Betty G, MD  ?[facility], 1 MG/DOSE, 4 MG/3ML SOPN Inject 2 mg into the skin every Friday. 06/19/21   [provider]  ?polyvinyl  alcohol (LIQUIFILM TEARS) 1.4 % ophthalmic solution Place 1 drop into both eyes as needed for dry eyes.    [provider]  ?pregabalin (LYRICA) 200 MG capsule TAKE 1 CAPSULE BY MOUTH THREE TIM

## 2021-11-02 NOTE — Progress Notes (Signed)
End of shift ? ?Pt arrived this evening on 5L of oxygen.  O2 sats checked and oxygen in the 90s on RA, oxygen removed.  Pt A&Ox4.  Admission completed, pt ordered dinner.  Pt has productive cough. ?

## 2021-11-03 DIAGNOSIS — J441 Chronic obstructive pulmonary disease with (acute) exacerbation: Secondary | ICD-10-CM | POA: Diagnosis not present

## 2021-11-03 LAB — BASIC METABOLIC PANEL
Anion gap: 7 (ref 5–15)
BUN: 23 mg/dL — ABNORMAL HIGH (ref 6–20)
CO2: 29 mmol/L (ref 22–32)
Calcium: 9.4 mg/dL (ref 8.9–10.3)
Chloride: 100 mmol/L (ref 98–111)
Creatinine, Ser: 0.89 mg/dL (ref 0.44–1.00)
GFR, Estimated: >60 mL/min (ref >60)
Glucose, Bld: 262 mg/dL — ABNORMAL HIGH (ref 70–99)
Potassium: 4.8 mmol/L (ref 3.5–5.1)
Sodium: 136 mmol/L (ref 135–145)

## 2021-11-03 LAB — GLUCOSE, CAPILLARY
Glucose-Capillary: 181 mg/dL — ABNORMAL HIGH (ref 70–99)
Glucose-Capillary: 225 mg/dL — ABNORMAL HIGH (ref 70–99)
Glucose-Capillary: 240 mg/dL — ABNORMAL HIGH (ref 70–99)
Glucose-Capillary: 283 mg/dL — ABNORMAL HIGH (ref 70–99)
Glucose-Capillary: 338 mg/dL — ABNORMAL HIGH (ref 70–99)
Glucose-Capillary: 392 mg/dL — ABNORMAL HIGH (ref 70–99)

## 2021-11-03 LAB — HIV ANTIBODY (ROUTINE TESTING W REFLEX): HIV Screen 4th Generation wRfx: NONREACTIVE

## 2021-11-03 LAB — URINE CULTURE: Culture: NO GROWTH

## 2021-11-03 MED ORDER — INSULIN ASPART 100 UNIT/ML IJ SOLN
10.0000 [IU] | Freq: Once | INTRAMUSCULAR | Status: AC
  Administered 2021-11-03: 10 [IU] via SUBCUTANEOUS

## 2021-11-03 MED ORDER — PREGABALIN 75 MG PO CAPS
200.0000 mg | ORAL_CAPSULE | Freq: Once | ORAL | Status: AC
  Administered 2021-11-03: 200 mg via ORAL
  Filled 2021-11-03: qty 1

## 2021-11-03 MED ORDER — INSULIN ASPART 100 UNIT/ML IJ SOLN
0.0000 [IU] | Freq: Three times a day (TID) | INTRAMUSCULAR | Status: DC
  Administered 2021-11-03: 4 [IU] via SUBCUTANEOUS
  Administered 2021-11-03: 7 [IU] via SUBCUTANEOUS
  Administered 2021-11-03: 15 [IU] via SUBCUTANEOUS
  Administered 2021-11-04 (×2): 4 [IU] via SUBCUTANEOUS
  Administered 2021-11-04: 15 [IU] via SUBCUTANEOUS
  Administered 2021-11-05: 11 [IU] via SUBCUTANEOUS
  Administered 2021-11-05: 4 [IU] via SUBCUTANEOUS

## 2021-11-03 MED ORDER — INSULIN ASPART 100 UNIT/ML IJ SOLN
5.0000 [IU] | Freq: Three times a day (TID) | INTRAMUSCULAR | Status: DC
  Administered 2021-11-03 – 2021-11-05 (×7): 5 [IU] via SUBCUTANEOUS

## 2021-11-03 MED ORDER — AZITHROMYCIN 250 MG PO TABS
500.0000 mg | ORAL_TABLET | Freq: Every day | ORAL | Status: DC
  Administered 2021-11-03 – 2021-11-05 (×3): 500 mg via ORAL
  Filled 2021-11-03 (×3): qty 2

## 2021-11-03 MED ORDER — IPRATROPIUM-ALBUTEROL 0.5-2.5 (3) MG/3ML IN SOLN
3.0000 mL | Freq: Three times a day (TID) | RESPIRATORY_TRACT | Status: DC
  Administered 2021-11-03 – 2021-11-05 (×5): 3 mL via RESPIRATORY_TRACT
  Filled 2021-11-03 (×6): qty 3

## 2021-11-03 NOTE — Progress Notes (Signed)
?PROGRESS NOTE ? ? ? ?Gina [name]  KWI:[id number] DOB: [birth year] DOA: 11/02/2021 ?PCP: Imagene Riches, NP  ? ?  ?Brief Narrative:  ?Gina [name] is a 56 y.o. female with medical history significant of anemia, anxiety, osteoarthritis, asthma/COPD, chronic back pain, fibromyalgia, type II DM, eosinophilia, GERD, migraine headaches, Oda lithiasis, history of recurrent UTIs, hyperlipidemia, hypertension, IBS, class III obesity, history of pneumonia, umbilical hernia, unspecified stomach tumor, history of RLE chronic wound who is coming to the emergency department with complaints of progressively worse dyspnea associated with productive cough of whitish sputum, rhinorrhea, nasal congestion, sore throat, ear fullness, wheezing, pleuritic chest pain, fever of 101 ?F, chills, decreased appetite for the past 7 days after she was exposed to her granddaughter last weekend who was having viral symptoms.  Also for the last 3 days, the patient has had multiple episodes of emesis and diarrhea with mild left flank and LLQ abdominal pain.  Patient was admitted for COPD exacerbation. ? ?New events last 24 hours / Subjective: ?Patient states that her breathing is mildly improved but although still having some wheezes.  Feels some nausea but without vomiting.  Diarrhea seems to have resolved since admission to the hospital.  Admits to a headache and asking for Tylenol.  States that she has a procedure with urology tomorrow, cystoscopy which needs to be rescheduled. ? ?Assessment & Plan: ?  ?Principal Problem: ?  COPD with acute exacerbation ([place]) ?Active Problems: ?  Hyperlipidemia ?  Hypertension ?  GERD (gastroesophageal reflux disease) ?  Hyperkalemia ?  Tobacco abuse ?  Aortic atherosclerosis ([place]) ?  Coronary atherosclerosis ? ? ?COPD exacerbation with acute hypoxemic respiratory failure ?-Currently remains on 2 L nasal cannula O2, wean as tolerated to maintain saturation 88 to 92% ?-COVID and influenza are  negative ?-Continue prednisone, azithromycin, breathing treatments ? ?Diabetes mellitus type 2, with hyperglycemia in setting of steroid use ?-Recent A1c 7.5 ?-NovoLog and sliding scale insulin ? ?Hyperlipidemia ?-Crestor ? ?Hypertension ?-Lisinopril ? ?GERD ?-PPI ? ?Anxiety ?-Xanax ? ?Tobacco abuse ?-Cessation counseling ? ?DVT prophylaxis: Lovenox ? ? ?Code Status: Full ?Family Communication: None at bedside ?Disposition Plan:  ?Status is: Observation ?The patient will require care spanning > 2 midnights and should be moved to inpatient because: remains SOB on [place] O2 ? ? ?Antimicrobials:  ?Anti-infectives (From admission, onward)  ? ? None  ? ?  ? ? ? ?Objective: ?Vitals:  ? 11/02/21 1959 11/02/21 2047 11/03/21 0544 11/03/21 0735  ?BP:  (!) 137/55 (!) 125/45   ?Pulse:  83 69   ?Resp:  20 18   ?Temp:  98.2 ?F (36.8 ?C) 98.2 ?F (36.8 ?C)   ?TempSrc:  Oral Oral   ?SpO2: 92% 91% 95% 98%  ?Weight:      ?Height:      ? ? ?Intake/Output Summary (Last 24 hours) at 11/03/2021 1106 ?Last data filed at 11/03/2021 1000 ?Gross per 24 hour  ?Intake 732.05 ml  ?Output --  ?Net 732.05 ml  ? ?Filed Weights  ? 11/02/21 1818  ?Weight: 125 kg  ? ? ?Examination:  ?General exam: Appears calm and comfortable  ?Respiratory system: Diminished breath sound with wheezes, mild conversational dyspnea  ?Cardiovascular system: S1 & S2 heard, RRR. No murmurs. No pedal edema. ?Gastrointestinal system: Abdomen is nondistended, soft and nontender. Normal bowel sounds heard. ?Central nervous system: Alert and oriented. No focal neurological deficits. Speech clear.  ?Extremities: Symmetric in appearance  ?Skin: No rashes, lesions or ulcers on exposed skin  ?Psychiatry:  Judgement and insight appear normal. Mood & affect appropriate.  ? ?Data Reviewed: I have personally reviewed following labs and imaging studies ? ?CBC: ?Recent Labs  ?Lab 11/02/21 ?1022  ?WBC 4.5  ?NEUTROABS 2.8  ?HGB 13.4  ?HCT 41.3  ?MCV 87.9  ?PLT 191  ? ?Basic Metabolic  Panel: ?Recent Labs  ?Lab 11/02/21 ?1022 11/02/21 ?2152 11/03/21 ?0331  ?NA 136  --  136  ?K 5.2*  --  4.8  ?CL 100  --  100  ?CO2 27  --  29  ?GLUCOSE 250* 402* 262*  ?BUN 21*  --  23*  ?CREATININE 0.73  --  0.89  ?CALCIUM 9.9  --  9.4  ?MG 1.9  --   --   ? ?GFR: ?Estimated Creatinine Clearance: 89.6 mL/min (by C-G formula based on SCr of 0.89 mg/dL). ?Liver Function Tests: ?No results for input(s): AST, ALT, ALKPHOS, BILITOT, PROT, ALBUMIN in the last 168 hours. ?No results for input(s): LIPASE, AMYLASE in the last 168 hours. ?No results for input(s): AMMONIA in the last 168 hours. ?Coagulation Profile: ?No results for input(s): INR, PROTIME in the last 168 hours. ?Cardiac Enzymes: ?No results for input(s): CKTOTAL, CKMB, CKMBINDEX, TROPONINI in the last 168 hours. ?BNP (last 3 results) ?No results for input(s): PROBNP in the last 8760 hours. ?HbA1C: ?No results for input(s): HGBA1C in the last 72 hours. ?CBG: ?Recent Labs  ?Lab 11/02/21 ?1815 11/02/21 ?2041 11/03/21 ?6734 11/03/21 ?0345 11/03/21 ?0749  ?GLUCAP 283* 426* 283* 240* 181*  ? ?Lipid Profile: ?No results for input(s): CHOL, HDL, LDLCALC, TRIG, CHOLHDL, LDLDIRECT in the last 72 hours. ?Thyroid Function Tests: ?No results for input(s): TSH, T4TOTAL, FREET4, T3FREE, THYROIDAB in the last 72 hours. ?Anemia Panel: ?No results for input(s): VITAMINB12, FOLATE, FERRITIN, TIBC, IRON, RETICCTPCT in the last 72 hours. ?Sepsis Labs: ?No results for input(s): PROCALCITON, LATICACIDVEN in the last 168 hours. ? ?Recent Results (from the past 240 hour(s))  ?Resp Panel by RT-PCR (Flu A&B, Covid) Nasopharyngeal Swab     Status: None  ? Collection Time: 11/02/21 10:22 AM  ? Specimen: Nasopharyngeal Swab; Nasopharyngeal(NP) swabs in vial transport medium  ?Result Value Ref Range Status  ? SARS Coronavirus 2 by RT PCR NEGATIVE NEGATIVE Final  ?  Comment: (NOTE) ?SARS-CoV-2 target nucleic acids are NOT DETECTED. ? ?The SARS-CoV-2 RNA is generally detectable in upper  respiratory ?specimens during the acute phase of infection. The lowest ?concentration of SARS-CoV-2 viral copies this assay can detect is ?138 copies/mL. A negative result does not preclude SARS-Cov-2 ?infection and should not be used as the sole basis for treatment or ?other patient management decisions. A negative result may occur with  ?improper specimen collection/handling, submission of specimen other ?than nasopharyngeal swab, presence of viral mutation(s) within the ?areas targeted by this assay, and inadequate number of viral ?copies(<138 copies/mL). A negative result must be combined with ?clinical observations, patient history, and epidemiological ?information. The expected result is Negative. ? ?Fact Sheet for Patients:  ?EntrepreneurPulse.com.au ? ?Fact Sheet for Healthcare Providers:  ?IncredibleEmployment.be ? ?This test is no t yet approved or cleared by the [place] FDA and  ?has been authorized for detection and/or diagnosis of SARS-CoV-2 by ?FDA under an Emergency Use Authorization (EUA). This EUA will remain  ?in effect (meaning this test can be used) for the duration of the ?COVID-19 declaration under Section 564(b)(1) of the Act, 21 ?U.S.C.section 360bbb-3(b)(1), unless the authorization is terminated  ?or revoked sooner.  ? ? ?  ? Influenza A  by PCR NEGATIVE NEGATIVE Final  ? Influenza B by PCR NEGATIVE NEGATIVE Final  ?  Comment: (NOTE) ?The Xpert Xpress SARS-CoV-2/FLU/RSV plus assay is intended as an aid ?in the diagnosis of influenza from Nasopharyngeal swab specimens and ?should not be used as a sole basis for treatment. Nasal washings and ?aspirates are unacceptable for Xpert Xpress SARS-CoV-2/FLU/RSV ?testing. ? ?Fact Sheet for Patients: ?EntrepreneurPulse.com.au ? ?Fact Sheet for Healthcare Providers: ?IncredibleEmployment.be ? ?This test is not yet approved or cleared by the [place] FDA and ?has been  authorized for detection and/or diagnosis of SARS-CoV-2 by ?FDA under an Emergency Use Authorization (EUA). This EUA will remain ?in effect (meaning this test can be used) for the duration of the ?COVID-19 declaration under Section 564(b)(

## 2021-11-03 NOTE — Plan of Care (Signed)
°  Problem: Education: °Goal: Knowledge of disease or condition will improve °Outcome: Progressing °Goal: Knowledge of the prescribed therapeutic regimen will improve °Outcome: Progressing °Goal: Individualized Educational Video(s) °Outcome: Progressing °  °

## 2021-11-04 ENCOUNTER — Ambulatory Visit (HOSPITAL_COMMUNITY): Admission: RE | Admit: 2021-11-04 | Payer: Medicare Other | Source: Home / Self Care | Admitting: Urology

## 2021-11-04 ENCOUNTER — Encounter (HOSPITAL_COMMUNITY): Admission: RE | Payer: Self-pay | Source: Home / Self Care

## 2021-11-04 DIAGNOSIS — Z20822 Contact with and (suspected) exposure to covid-19: Secondary | ICD-10-CM | POA: Diagnosis present

## 2021-11-04 DIAGNOSIS — F17210 Nicotine dependence, cigarettes, uncomplicated: Secondary | ICD-10-CM | POA: Diagnosis present

## 2021-11-04 DIAGNOSIS — Z7985 Long-term (current) use of injectable non-insulin antidiabetic drugs: Secondary | ICD-10-CM | POA: Diagnosis not present

## 2021-11-04 DIAGNOSIS — F419 Anxiety disorder, unspecified: Secondary | ICD-10-CM | POA: Diagnosis present

## 2021-11-04 DIAGNOSIS — J441 Chronic obstructive pulmonary disease with (acute) exacerbation: Secondary | ICD-10-CM | POA: Diagnosis present

## 2021-11-04 DIAGNOSIS — E875 Hyperkalemia: Secondary | ICD-10-CM | POA: Diagnosis present

## 2021-11-04 DIAGNOSIS — J9601 Acute respiratory failure with hypoxia: Secondary | ICD-10-CM | POA: Diagnosis present

## 2021-11-04 DIAGNOSIS — M1990 Unspecified osteoarthritis, unspecified site: Secondary | ICD-10-CM | POA: Diagnosis present

## 2021-11-04 DIAGNOSIS — N200 Calculus of kidney: Secondary | ICD-10-CM | POA: Diagnosis present

## 2021-11-04 DIAGNOSIS — I700 Atherosclerosis of aorta: Secondary | ICD-10-CM | POA: Diagnosis present

## 2021-11-04 DIAGNOSIS — G8929 Other chronic pain: Secondary | ICD-10-CM | POA: Diagnosis present

## 2021-11-04 DIAGNOSIS — E785 Hyperlipidemia, unspecified: Secondary | ICD-10-CM | POA: Diagnosis present

## 2021-11-04 DIAGNOSIS — Z87440 Personal history of urinary (tract) infections: Secondary | ICD-10-CM | POA: Diagnosis not present

## 2021-11-04 DIAGNOSIS — Z7982 Long term (current) use of aspirin: Secondary | ICD-10-CM | POA: Diagnosis not present

## 2021-11-04 DIAGNOSIS — Z6841 Body Mass Index (BMI) 40.0 and over, adult: Secondary | ICD-10-CM | POA: Diagnosis not present

## 2021-11-04 DIAGNOSIS — Z7951 Long term (current) use of inhaled steroids: Secondary | ICD-10-CM | POA: Diagnosis not present

## 2021-11-04 DIAGNOSIS — I2510 Atherosclerotic heart disease of native coronary artery without angina pectoris: Secondary | ICD-10-CM | POA: Diagnosis present

## 2021-11-04 DIAGNOSIS — R109 Unspecified abdominal pain: Secondary | ICD-10-CM | POA: Diagnosis present

## 2021-11-04 DIAGNOSIS — Z794 Long term (current) use of insulin: Secondary | ICD-10-CM | POA: Diagnosis not present

## 2021-11-04 DIAGNOSIS — E1165 Type 2 diabetes mellitus with hyperglycemia: Secondary | ICD-10-CM | POA: Diagnosis present

## 2021-11-04 DIAGNOSIS — M797 Fibromyalgia: Secondary | ICD-10-CM | POA: Diagnosis present

## 2021-11-04 DIAGNOSIS — I10 Essential (primary) hypertension: Secondary | ICD-10-CM | POA: Diagnosis not present

## 2021-11-04 DIAGNOSIS — K219 Gastro-esophageal reflux disease without esophagitis: Secondary | ICD-10-CM | POA: Diagnosis present

## 2021-11-04 DIAGNOSIS — Z79899 Other long term (current) drug therapy: Secondary | ICD-10-CM | POA: Diagnosis not present

## 2021-11-04 LAB — GLUCOSE, CAPILLARY
Glucose-Capillary: 164 mg/dL — ABNORMAL HIGH (ref 70–99)
Glucose-Capillary: 190 mg/dL — ABNORMAL HIGH (ref 70–99)
Glucose-Capillary: 293 mg/dL — ABNORMAL HIGH (ref 70–99)
Glucose-Capillary: 299 mg/dL — ABNORMAL HIGH (ref 70–99)
Glucose-Capillary: 339 mg/dL — ABNORMAL HIGH (ref 70–99)

## 2021-11-04 SURGERY — CYSTOSCOPY, WITH BIOPSY
Anesthesia: General | Laterality: Bilateral

## 2021-11-04 MED ORDER — PREGABALIN 75 MG PO CAPS
200.0000 mg | ORAL_CAPSULE | Freq: Once | ORAL | Status: AC
  Administered 2021-11-04: 200 mg via ORAL
  Filled 2021-11-04: qty 1

## 2021-11-04 MED ORDER — INSULIN GLARGINE-YFGN 100 UNIT/ML ~~LOC~~ SOLN
20.0000 [IU] | Freq: Every day | SUBCUTANEOUS | Status: DC
  Administered 2021-11-04 – 2021-11-05 (×2): 20 [IU] via SUBCUTANEOUS
  Filled 2021-11-04 (×2): qty 0.2

## 2021-11-04 NOTE — Plan of Care (Signed)
?  Problem: Education: ?Goal: Knowledge of disease or condition will improve ?Outcome: Progressing ?Goal: Knowledge of the prescribed therapeutic regimen will improve ?Outcome: Progressing ?  ?Problem: Respiratory: ?Goal: Ability to maintain a clear airway will improve ?Outcome: Progressing ?Goal: Levels of oxygenation will improve ?Outcome: Progressing ?  ?

## 2021-11-04 NOTE — Progress Notes (Signed)
?PROGRESS NOTE ? ? ? ?SAGE HAMMILL  ZHY:[id number] DOB: [birth year] DOA: 11/02/2021 ?PCP: Imagene Riches, NP  ? ?  ?Brief Narrative:  ?Gina [name] is a 56 y.o. female with medical history significant of anemia, anxiety, osteoarthritis, asthma/COPD, chronic back pain, fibromyalgia, type II DM, eosinophilia, GERD, migraine headaches, Oda lithiasis, history of recurrent UTIs, hyperlipidemia, hypertension, IBS, class III obesity, history of pneumonia, umbilical hernia, unspecified stomach tumor, history of RLE chronic wound who is coming to the emergency department with complaints of progressively worse dyspnea associated with productive cough of whitish sputum, rhinorrhea, nasal congestion, sore throat, ear fullness, wheezing, pleuritic chest pain, fever of 101 ?F, chills, decreased appetite for the past 7 days after she was exposed to her granddaughter last weekend who was having viral symptoms.  Also for the last 3 days, the patient has had multiple episodes of emesis and diarrhea with mild left flank and LLQ abdominal pain.  Patient was admitted for COPD exacerbation. ? ?New events last 24 hours / Subjective: ?She was weaned off oxygen today, continues to have exertional shortness of breath to the bathroom.  Continues with nausea without vomiting.  Passing a lot of gas but diarrhea resolved. ? ?Assessment & Plan: ?  ?Principal Problem: ?  COPD with acute exacerbation ([place]) ?Active Problems: ?  Hyperlipidemia ?  Hypertension ?  GERD (gastroesophageal reflux disease) ?  Hyperkalemia ?  Tobacco abuse ?  Aortic atherosclerosis ([place]) ?  Coronary atherosclerosis ? ? ?COPD exacerbation with acute hypoxemic respiratory failure ?-Weaned to room air today, but still having exertional shortness of breath ?-COVID and influenza are negative ?-Continue prednisone, azithromycin, breathing treatments ? ?Diabetes mellitus type 2, with hyperglycemia in setting of steroid use ?-Recent A1c 7.5 ?-Semglee, NovoLog and sliding  scale insulin.  Dosing adjusted today due to hyperglycemia ? ?Hyperlipidemia ?-Crestor ? ?Hypertension ?-Lisinopril ? ?GERD ?-PPI ? ?Anxiety ?-Xanax ? ?Tobacco abuse ?-Cessation counseling ? ?DVT prophylaxis: Lovenox ? ? ?Code Status: Full ?Family Communication: None at bedside ?Disposition Plan:  ?Status is: Observation ?The patient will require care spanning > 2 midnights and should be moved to inpatient because: remains with exertional shortness of breath ? ? ?Antimicrobials:  ?Anti-infectives (From admission, onward)  ? ? Start     Dose/Rate Route Frequency Ordered Stop  ? 11/03/21 1200  azithromycin (ZITHROMAX) tablet 500 mg       ? 500 mg Oral Daily 11/03/21 1111    ? ?  ? ? ? ?Objective: ?Vitals:  ? 11/03/21 1320 11/03/21 1951 11/04/21 0417 11/04/21 0929  ?BP: 135/65 (!) 151/72 127/61 133/63  ?Pulse: 71 74 71   ?Resp: '20 16 18   '$ ?Temp: 98 ?F (36.7 ?C) 98 ?F (36.7 ?C) 98 ?F (36.7 ?C)   ?TempSrc: Oral Oral Oral   ?SpO2: 97% 96% 99%   ?Weight:      ?Height:      ? ? ?Intake/Output Summary (Last 24 hours) at 11/04/2021 1057 ?Last data filed at 11/04/2021 0915 ?Gross per 24 hour  ?Intake 960 ml  ?Output --  ?Net 960 ml  ? ? ?Filed Weights  ? 11/02/21 1818  ?Weight: 125 kg  ? ? ?Examination:  ?General exam: Appears calm and comfortable  ?Respiratory system: Diminished breath sound with wheezes, mild conversational dyspnea, on room air today ?Cardiovascular system: S1 & S2 heard, RRR. No murmurs. No pedal edema. ?Gastrointestinal system: Abdomen is nondistended, soft and nontender. Normal bowel sounds heard. ?Central nervous system: Alert and oriented. No focal neurological deficits. Speech  clear.  ?Extremities: Symmetric in appearance  ?Skin: No rashes, lesions or ulcers on exposed skin  ?Psychiatry: Judgement and insight appear normal. Mood & affect appropriate.  ? ?Data Reviewed: I have personally reviewed following labs and imaging studies ? ?CBC: ?Recent Labs  ?Lab 11/02/21 ?1022  ?WBC 4.5  ?NEUTROABS 2.8  ?HGB  13.4  ?HCT 41.3  ?MCV 87.9  ?PLT 191  ? ? ?Basic Metabolic Panel: ?Recent Labs  ?Lab 11/02/21 ?1022 11/02/21 ?2152 11/03/21 ?0331  ?NA 136  --  136  ?K 5.2*  --  4.8  ?CL 100  --  100  ?CO2 27  --  29  ?GLUCOSE 250* 402* 262*  ?BUN 21*  --  23*  ?CREATININE 0.73  --  0.89  ?CALCIUM 9.9  --  9.4  ?MG 1.9  --   --   ? ? ?GFR: ?Estimated Creatinine Clearance: 89.6 mL/min (by C-G formula based on SCr of 0.89 mg/dL). ?Liver Function Tests: ?No results for input(s): AST, ALT, ALKPHOS, BILITOT, PROT, ALBUMIN in the last 168 hours. ?No results for input(s): LIPASE, AMYLASE in the last 168 hours. ?No results for input(s): AMMONIA in the last 168 hours. ?Coagulation Profile: ?No results for input(s): INR, PROTIME in the last 168 hours. ?Cardiac Enzymes: ?No results for input(s): CKTOTAL, CKMB, CKMBINDEX, TROPONINI in the last 168 hours. ?BNP (last 3 results) ?No results for input(s): PROBNP in the last 8760 hours. ?HbA1C: ?No results for input(s): HGBA1C in the last 72 hours. ?CBG: ?Recent Labs  ?Lab 11/03/21 ?1130 11/03/21 ?1651 11/03/21 ?1949 11/04/21 ?7829 11/04/21 ?0757  ?GLUCAP 225* 338* 392* 293* 190*  ? ? ?Lipid Profile: ?No results for input(s): CHOL, HDL, LDLCALC, TRIG, CHOLHDL, LDLDIRECT in the last 72 hours. ?Thyroid Function Tests: ?No results for input(s): TSH, T4TOTAL, FREET4, T3FREE, THYROIDAB in the last 72 hours. ?Anemia Panel: ?No results for input(s): VITAMINB12, FOLATE, FERRITIN, TIBC, IRON, RETICCTPCT in the last 72 hours. ?Sepsis Labs: ?No results for input(s): PROCALCITON, LATICACIDVEN in the last 168 hours. ? ?Recent Results (from the past 240 hour(s))  ?Urine Culture     Status: None  ? Collection Time: 11/02/21  8:56 AM  ? Specimen: Urine, Clean Catch  ?Result Value Ref Range Status  ? Specimen Description   Final  ?  URINE, CLEAN CATCH ?Performed at [facility], [place] [address] [place], [place] [address] ?  ? Special Requests   Final  ?  NONE ?Performed at [facility], [place] [address] [place], [place] [address] ?  ? Culture   Final  ?  NO GROWTH ?Performed at [facility], [place] [address], [place], [place] [address] ?  ? Report Status 11/03/2021 FINAL  Final  ?Resp Panel by RT-PCR (Flu A&B, Covid) Nasopharyngeal Swab     Status: None  ? Collection Time: 11/02/21 10:22 AM  ? Specimen: Nasopharyngeal Swab; Nasopharyngeal(NP) swabs in vial transport medium  ?Result Value Ref Range Status  ? SARS Coronavirus 2 by RT PCR NEGATIVE NEGATIVE Final  ?  Comment: (NOTE) ?SARS-CoV-2 target nucleic acids are NOT DETECTED. ? ?The SARS-CoV-2 RNA is generally detectable in upper respiratory ?specimens during the acute phase of infection. The lowest ?concentration of SARS-CoV-2 viral copies this assay can detect is ?138 copies/mL. A negative result does not preclude SARS-Cov-2 ?infection and should not be used as the sole basis for treatment or ?other patient management decisions. A negative result may occur with  ?improper specimen collection/handling, submission of specimen other ?  than nasopharyngeal swab, presence of viral mutation(s) within the ?areas targeted by this assay, and inadequate number of viral ?copies(<138 copies/mL). A negative result must be combined with ?clinical observations, patient history, and epidemiological ?information. The expected result is Negative. ? ?Fact Sheet for Patients:  ?EntrepreneurPulse.com.au ? ?Fact Sheet for Healthcare Providers:  ?IncredibleEmployment.be ? ?This test is no t yet approved or cleared by the [place] FDA and  ?has been authorized for detection and/or diagnosis of SARS-CoV-2 by ?FDA under an Emergency Use Authorization (EUA). This EUA will remain  ?in effect (meaning this test can be used) for the duration of the ?COVID-19 declaration under Section 564(b)(1) of the Act, 21 ?U.S.C.section 360bbb-3(b)(1), unless the authorization is terminated  ?or revoked sooner.  ? ? ?  ? Influenza A by  PCR NEGATIVE NEGATIVE Final  ? Influenza B by PCR NEGATIVE NEGATIVE Final  ?  Comment: (NOTE) ?The Xpert Xpress SARS-CoV-2/FLU/RSV plus assay is intended as an aid ?in the diagnosis of influenza from Nasop

## 2021-11-05 DIAGNOSIS — I10 Essential (primary) hypertension: Secondary | ICD-10-CM

## 2021-11-05 LAB — GLUCOSE, CAPILLARY
Glucose-Capillary: 163 mg/dL — ABNORMAL HIGH (ref 70–99)
Glucose-Capillary: 252 mg/dL — ABNORMAL HIGH (ref 70–99)

## 2021-11-05 MED ORDER — PREDNISONE 10 MG PO TABS: ORAL_TABLET | ORAL | 0 refills | Status: DC

## 2021-11-05 MED ORDER — IPRATROPIUM-ALBUTEROL 0.5-2.5 (3) MG/3ML IN SOLN: 3.0000 mL | Freq: Four times a day (QID) | RESPIRATORY_TRACT | Status: DC

## 2021-11-05 MED ORDER — AZITHROMYCIN 500 MG PO TABS: ORAL_TABLET | ORAL | 0 refills | Status: DC

## 2021-11-05 MED ORDER — GUAIFENESIN ER 600 MG PO TB12
600.0000 mg | ORAL_TABLET | Freq: Two times a day (BID) | ORAL | Status: DC
  Administered 2021-11-05: 600 mg via ORAL
  Filled 2021-11-05: qty 1

## 2021-11-05 MED ORDER — GUAIFENESIN ER 600 MG PO TB12: 1200.0000 mg | ORAL_TABLET | Freq: Two times a day (BID) | ORAL | Status: DC

## 2021-11-05 MED ORDER — GUAIFENESIN ER 600 MG PO TB12: 600.0000 mg | ORAL_TABLET | Freq: Two times a day (BID) | ORAL | 0 refills | Status: AC

## 2021-11-05 NOTE — Progress Notes (Signed)
AVS and discharge instructions reviewed w/ patient and husband at the bedside. Patient verbalized understanding and had no further questions. ?

## 2021-11-05 NOTE — Progress Notes (Signed)
Inpatient Diabetes Program Recommendations ? ?AACE/ADA: New Consensus Statement on Inpatient Glycemic Control (2015) ? ?Target Ranges:  Prepandial:   less than 140 mg/dL ?     Peak postprandial:   less than 180 mg/dL (1-2 hours) ?     Critically ill patients:  140 - 180 mg/dL  ? ?Lab Results  ?Component Value Date  ? GLUCAP 163 (H) 11/05/2021  ? HGBA1C 7.5 (H) 10/21/2021  ? ? ?Review of Glycemic Control ? Latest Reference Range & Units 11/04/21 07:57 11/04/21 11:15 11/04/21 16:18 11/04/21 21:27 11/05/21 07:40  ?Glucose-Capillary 70 - 99 mg/dL 190 (H) 164 (H) 339 (H) 299 (H) 163 (H)  ? ?Diabetes history: DM 2 ?Current orders for Inpatient glycemic control:  ?Semglee 20 units Daily ?Novolog 0-20 units tid ?Novolog 5 units tid meal coverage ? ?PO prednisone 40 mg Daily ? ?Inpatient Diabetes Program Recommendations:   ? ?-   Consider increasing Novolog meal coverage to 8 units tid while on current steroid dose ?-   Consider adding Novolog hs scale ? ?Thanks, ? ?Tama Headings RN, MSN, BC-ADM ?Inpatient Diabetes Coordinator ?Team Pager [phone] (8a-5p) ?

## 2021-11-05 NOTE — Plan of Care (Signed)
?  Problem: Education: ?Goal: Knowledge of disease or condition will improve ?Outcome: Progressing ?  ?Problem: Activity: ?Goal: Ability to tolerate increased activity will improve ?Outcome: Progressing ?  ?Problem: Respiratory: ?Goal: Ability to maintain a clear airway will improve ?Outcome: Progressing ?Goal: Levels of oxygenation will improve ?Outcome: Progressing ?  ?

## 2021-11-05 NOTE — Discharge Summary (Addendum)
?Physician Discharge Summary ?  ?Patient: Gina [name] MRN: [id number] DOB: 1966/02/20  ?Admit date:     11/02/2021  ?Discharge date: 11/05/21  ?Discharge Physician: Oswald Hillock  ? ?PCP: Imagene Riches, NP  ? ?Recommendations at discharge:  ? ?Follow-up PCP in 1 week ? ?Discharge Diagnoses: ?Principal Problem: ?  COPD with acute exacerbation ([place]) ?Active Problems: ?  Hyperlipidemia ?  Hypertension ?  GERD (gastroesophageal reflux disease) ?  Hyperkalemia ?  Tobacco abuse ?  Aortic atherosclerosis ([place]) ?  Coronary atherosclerosis ?  COPD exacerbation ([place]) ? ?Resolved Problems: ?  * No resolved hospital problems. * ? ?Hospital course ? ?56 y.o. female with medical history significant of anemia, anxiety, osteoarthritis, asthma/COPD, chronic back pain, fibromyalgia, type II DM, eosinophilia, GERD, migraine headaches, Oda lithiasis, history of recurrent UTIs, hyperlipidemia, hypertension, IBS, class III obesity, history of pneumonia, umbilical hernia, unspecified stomach tumor, history of RLE chronic wound who is coming to the emergency department with complaints of progressively worse dyspnea associated with productive cough of whitish sputum, rhinorrhea, nasal congestion, sore throat, ear fullness, wheezing, pleuritic chest pain, fever of 101 ?F, chills, decreased appetite for the past 7 days after she was exposed to her granddaughter last weekend who was having viral symptoms.  Also for the last 3 days, the patient has had multiple episodes of emesis and diarrhea with mild left flank and LLQ abdominal pain.  Patient was admitted for COPD exacerbation. ? ?Assessment and Plan: ? ?COPD exacerbation ?-Significantly improved, not requiring oxygen ?-Oxygen saturation 93% on room air ?-We will discharge home on prednisone taper for 5 days ?-Mucinex 600 mg p.o. twice daily for 5 days ?-Continue nebulizer and inhalers as prescribed with home regimen ? ?Diabetes mellitus type 2 ?-Recent hemoglobin A1c 7.5 ?-Continue home  regimen with Ozempic ? ?Hyperlipidemia ?-Continue Crestor ? ?Hypertension ?-Continue home regimen ? ?Anxiety ?-Continue Xanax as prescribed ? ?Tobacco abuse ?-Counseled ? ?Nonobstructing left lower pole renal calculus/chronic bilateral renal scarring ?-Seen on CT abdomen/pelvis ?-Patient has an appointment to see urologist as outpatient ? ? ?  ? ? ?Consultants:  ?Procedures performed:  ?Disposition: Home ?Diet recommendation:  ?Discharge Diet Orders (From admission, onward)  ? ?  Start     Ordered  ? 11/05/21 0000  Diet - low sodium heart healthy       ? 11/05/21 1158  ? ?  ?  ? ?  ? ?Carb modified diet ?DISCHARGE MEDICATION: ?Allergies as of 11/05/2021   ? ?   Reactions  ? Bee Venom Anaphylaxis  ? Darvocet [propoxyphene N-acetaminophen] Nausea And Vomiting  ? Fish Allergy Anaphylaxis  ? Morphine And Related Itching, Other (See Comments)  ? "I go crazy"  ? Penicillins Hives, Itching, Swelling, Other (See Comments)  ? Tolerates Rocephin ?Swelling all over body  ?Has patient had a PCN reaction causing immediate rash, facial/tongue/throat swelling, SOB or lightheadedness with hypotension: Yes ?Has patient had a PCN reaction causing severe rash involving mucus membranes or skin necrosis: Yes ?Has patient had a PCN reaction that required hospitalization Unknown ?Has patient had a PCN reaction occurring within the last 10 years: No  ?If all of the above answers are "NO", then may proceed with Cephalosporin use.  ? Tape Hives, Other (See Comments)  ? Adhesive tape  ? Fentanyl Nausea Only, Other (See Comments)  ? "I go crazy"  ? ?  ? ?  ?Medication List  ?  ? ?STOP taking these medications   ? ?benzonatate 200 MG capsule ?Commonly  known as: TESSALON ?  ?Robitussin 12 Hour Cough 30 MG/5ML liquid ?Generic drug: dextromethorphan ?  ? ?  ? ?TAKE these medications   ? ?acetaminophen 500 MG tablet ?Commonly known as: TYLENOL ?Take 1,000 mg by mouth every 6 (six) hours as needed for mild pain. ?  ?ALPRAZolam 1 MG tablet ?Commonly  known as: Duanne Moron ?Take 1 mg by mouth 2 (two) times daily as needed for anxiety. ?  ?aspirin EC 81 MG tablet ?Take 1 tablet (81 mg total) by mouth daily. Swallow whole. ?  ?azithromycin 500 MG tablet ?Commonly known as: ZITHROMAX ?Take 1 tab po daily for 2 days ?Start taking on: November 06, 2021 ?  ?cetirizine 10 MG tablet ?Commonly known as: ZYRTEC ?Take 10 mg by mouth in the morning. ?  ?cyclobenzaprine 10 MG tablet ?Commonly known as: FLEXERIL ?Take 10 mg by mouth daily as needed for muscle spasms. ?  ?fluticasone 50 MCG/ACT nasal spray ?Commonly known as: FLONASE ?Place 1-2 sprays into both nostrils daily. ?  ?Fluticasone-Salmeterol 250-50 MCG/DOSE Aepb ?Commonly known as: Wixela Inhub ?Inhale 1 puff into the lungs 2 (two) times daily. ?What changed: when to take this ?  ?glucose blood test strip ?Commonly known as: ONE TOUCH ULTRA TEST ?USE TO TEST BLOOD SUGAR 3 TIMES DAILY ?  ?guaiFENesin 600 MG 12 hr tablet ?Commonly known as: [place] ?Take 1 tablet (600 mg total) by mouth 2 (two) times daily for 5 days. ?  ?lisinopril 5 MG tablet ?Commonly known as: ZESTRIL ?Take 5 mg by mouth daily. ?  ?magnesium oxide 400 (240 Mg) MG tablet ?Commonly known as: MAG-OX ?Take 400 mg by mouth daily. ?  ?montelukast 10 MG tablet ?Commonly known as: SINGULAIR ?Take 10 mg by mouth at bedtime. ?  ?nystatin powder ?Commonly known as: nystatin ?Apply topically 3 (three) times daily as needed. ?What changed:  ?how much to take ?reasons to take this ?  ?omeprazole 20 MG capsule ?Commonly known as: PRILOSEC ?TAKE 1 CAPSULE (20 MG TOTAL) BY MOUTH DAILY BEFORE BREAKFAST. ?  ?Ozempic (2 MG/DOSE) 8 MG/3ML Sopn ?Generic drug: Semaglutide (2 MG/DOSE) ?Inject 2 mg into the skin every Wednesday. ?  ?predniSONE 10 MG tablet ?Commonly known as: DELTASONE ?Prednisone 40 mg po daily x 1 day then Prednisone 30 mg po daily x 1 day then Prednisone 20 mg po daily x 1 day then Prednisone 10 mg daily x 1 day then stop... ?  ?pregabalin 200 MG  capsule ?Commonly known as: Lyrica ?TAKE 1 CAPSULE BY MOUTH THREE TIMES A DAY ?What changed:  ?how much to take ?how to take this ?when to take this ?additional instructions ?  ?promethazine 25 MG tablet ?Commonly known as: PHENERGAN ?Take 25 mg by mouth every 8 (eight) hours as needed for nausea or vomiting. ?  ?rosuvastatin 10 MG tablet ?Commonly known as: CRESTOR ?Take 10 mg by mouth daily. ?  ?Ventolin HFA 108 (90 Base) MCG/ACT inhaler ?Generic drug: albuterol ?INHALE 2 PUFFS EVERY 6 (SIX) HOURS AS NEEDED INTO THE LUNGS FOR WHEEZING OR SHORTNESS OF BREATH. ?What changed: See the new instructions. ?  ?Vitamin D (Ergocalciferol) 1.25 MG (50000 UNIT) Caps capsule ?Commonly known as: DRISDOL ?Take 50,000 Units by mouth every Monday. ?  ? ?  ? ? ?Discharge Exam: ?Danley Danker Weights  ? 11/02/21 1818  ?Weight: 125 kg  ? ?General-appears in no acute distress ?Heart-S1-S2, regular, no murmur auscultated ?Lungs-clear to auscultation bilaterally, no wheezing or crackles auscultated ?Abdomen-soft, nontender, no organomegaly ?Extremities-no edema in the lower extremities ?  Neuro-alert, oriented x3, no focal deficit noted ? ?Condition at discharge: stable ? ?The results of significant diagnostics from this hospitalization (including imaging, microbiology, ancillary and laboratory) are listed below for reference.  ? ?Imaging Studies: ?DG Chest 2 View ? ?Result Date: 11/02/2021 ?CLINICAL DATA:  56 year old female with history of productive cough and shortness of breath for 1 week. EXAM: CHEST - 2 VIEW COMPARISON:  Chest x-ray 06/13/2021. FINDINGS: Lung volumes are normal. No consolidative airspace disease. No pleural effusions. No pneumothorax. No pulmonary nodule or mass noted. Pulmonary vasculature and the cardiomediastinal silhouette are within normal limits. Atherosclerosis in the thoracic aorta. IMPRESSION: 1.  No radiographic evidence of acute cardiopulmonary disease. 2. Aortic atherosclerosis. Electronically Signed   By: Vinnie Langton M.D.   On: 11/02/2021 11:01  ? ?CT Renal Stone Study ? ?Result Date: 11/02/2021 ?CLINICAL DATA:  Flank pain with kidney stone suspected EXAM: CT ABDOMEN AND PELVIS WITHOUT CONTRAST TECHNIQUE: Multidetecto

## 2021-11-10 ENCOUNTER — Other Ambulatory Visit: Payer: Self-pay | Admitting: Urology

## 2021-11-19 DIAGNOSIS — R5383 Other fatigue: Secondary | ICD-10-CM | POA: Diagnosis not present

## 2021-11-19 DIAGNOSIS — M545 Low back pain, unspecified: Secondary | ICD-10-CM | POA: Diagnosis not present

## 2021-11-19 DIAGNOSIS — R062 Wheezing: Secondary | ICD-10-CM | POA: Diagnosis not present

## 2021-11-19 DIAGNOSIS — E1165 Type 2 diabetes mellitus with hyperglycemia: Secondary | ICD-10-CM | POA: Diagnosis not present

## 2021-11-19 DIAGNOSIS — R3915 Urgency of urination: Secondary | ICD-10-CM | POA: Diagnosis not present

## 2021-11-19 DIAGNOSIS — E785 Hyperlipidemia, unspecified: Secondary | ICD-10-CM | POA: Diagnosis not present

## 2021-11-19 DIAGNOSIS — Z5181 Encounter for therapeutic drug level monitoring: Secondary | ICD-10-CM | POA: Diagnosis not present

## 2021-11-19 DIAGNOSIS — J40 Bronchitis, not specified as acute or chronic: Secondary | ICD-10-CM | POA: Diagnosis not present

## 2021-11-19 DIAGNOSIS — Z79899 Other long term (current) drug therapy: Secondary | ICD-10-CM | POA: Diagnosis not present

## 2021-11-19 DIAGNOSIS — R053 Chronic cough: Secondary | ICD-10-CM | POA: Diagnosis not present

## 2021-11-19 DIAGNOSIS — R8290 Unspecified abnormal findings in urine: Secondary | ICD-10-CM | POA: Diagnosis not present

## 2021-11-27 NOTE — Patient Instructions (Addendum)
DUE TO COVID-19 ONLY TWO VISITORS  (aged 58 and older)  IS ALLOWED TO COME WITH YOU AND STAY IN THE WAITING ROOM ONLY DURING PRE OP AND PROCEDURE.   ?**NO VISITORS ARE ALLOWED IN THE SHORT STAY AREA OR RECOVERY ROOM!!** ? ?You are not required to quarantine ?Hand Hygiene often ?Do NOT share personal items ?Notify your provider if you are in close contact with someone who has COVID or you develop fever 100.4 or greater, new onset of sneezing, cough, sore throat, shortness of breath or body aches. ? ?     ? Your procedure is scheduled on:  12-05-21 ? ? Report to [facility] Main Entrance ? ?  Report to admitting at 11:45 AM ? ? Call this number if you have problems the morning of surgery [phone] ? ? Do not eat food :After Midnight. ? ? After Midnight you may have the following liquids until 11:00 AM DAY OF SURGERY ? ? ?FOLLOW ANY ADDITIONAL PRE OP INSTRUCTIONS YOU RECEIVED FROM YOUR SURGEON'S OFFICE!!! ?  ?  ?Oral Hygiene is also important to reduce your risk of infection.                                    ?Remember - BRUSH YOUR TEETH THE MORNING OF SURGERY WITH YOUR REGULAR TOOTHPASTE ? ? Do NOT smoke after Midnight ? ?Take these medicines the morning of surgery with A SIP OF WATER: Alprazolam, Zyrtec, Omeprazole, Pregabalin, Promethazine, Rosuvastatin.  Okay to use inhalers and Tylenol if needed. ? ?How to Manage Your Diabetes ?Before and After Surgery ? ?Why is it important to control my blood sugar before and after surgery? ?Improving blood sugar levels before and after surgery helps healing and can limit problems. ?A way of improving blood sugar control is eating a healthy diet by: ? Eating less sugar and carbohydrates ? Increasing activity/exercise ? Talking with your doctor about reaching your blood sugar goals ?High blood sugars (greater than 180 mg/dL) can raise your risk of infections and slow your recovery, so you will need to focus on controlling your diabetes during the weeks before  surgery. ?Make sure that the doctor who takes care of your diabetes knows about your planned surgery including the date and location. ? ?How do I manage my blood sugar before surgery? ?Check your blood sugar at least 4 times a day, starting 2 days before surgery, to make sure that the level is not too high or low. ?Check your blood sugar the morning of your surgery when you wake up and every 2 hours until you get to the Short Stay unit. ?If your blood sugar is less than 70 mg/dL, you will need to treat for low blood sugar: ?Do not take insulin. ?Treat a low blood sugar (less than 70 mg/dL) with ? cup of clear juice (cranberry or apple), 4 glucose tablets, OR glucose gel. ?Recheck blood sugar in 15 minutes after treatment (to make sure it is greater than 70 mg/dL). If your blood sugar is not greater than 70 mg/dL on recheck, call [phone] for further instructions. ?Report your blood sugar to the short stay nurse when you get to Short Stay. ? ?If you are admitted to the hospital after surgery: ?Your blood sugar will be checked by the staff and you will probably be given insulin after surgery (instead of oral diabetes medicines) to make sure you have good blood sugar levels. ?The  goal for blood sugar control after surgery is 80-180 mg/dL. ? ? ?WHAT DO I DO ABOUT MY DIABETES MEDICATION? ? ?Do not take oral diabetes medicines (pills) the morning of surgery. ? ?THE DAY BEFORE SURGERY:  Take Ozempic as prescribed.     ? ? ?THE MORNING OF SURGERY:  Do not take Ozempic. ? ?Reviewed and Endorsed by [facility] Patient Education Committee, August 2015  ?              ?           You may not have any metal on your body including hair pins, jewelry, and body piercing ? ?           Do not wear make-up, lotions, powders, perfumes or deodorant ? ?Do not wear nail polish including gel and S&S, artificial/acrylic nails, or any other type of covering on natural nails including finger and toenails. If you have artificial nails, gel  coating, etc. that needs to be removed by a nail salon please have this removed prior to surgery or surgery may need to be canceled/ delayed if the surgeon/ anesthesia feels like they are unable to be safely monitored.  ? ?Do not shave  48 hours prior to surgery.  ?     ? Do not bring valuables to the hospital. [place]. ? ? Contacts, dentures or bridgework may not be worn into surgery. ? ?Patients discharged on the day of surgery will not be allowed to drive home.  Someone NEEDS to stay with you for the first 24 hours after anesthesia. ? ?Please read over the following fact sheets you were given: [place] [phone] ? ?[facility] [place] - Preparing for Surgery ?Before surgery, you can play an important role.  Because skin is not sterile, your skin needs to be as free of germs as possible.  You can reduce the number of germs on your skin by washing with CHG (chlorahexidine gluconate) soap before surgery.  CHG is an antiseptic cleaner which kills germs and bonds with the skin to continue killing germs even after washing. ?Please DO NOT use if you have an allergy to CHG or antibacterial soaps.  If your skin becomes reddened/irritated stop using the CHG and inform your nurse when you arrive at Short Stay. ?Do not shave (including legs and underarms) for at least 48 hours prior to the first CHG shower.  You may shave your face/neck. ? ?Please follow these instructions carefully: ? 1.  Shower with CHG Soap the night before surgery and the  morning of surgery. ? 2.  If you choose to wash your hair, wash your hair first as usual with your normal  shampoo. ? 3.  After you shampoo, rinse your hair and body thoroughly to remove the shampoo.                            ? 4.  Use CHG as you would any other liquid soap.  You can apply chg directly to the skin and wash.  Gently with a scrungie or clean washcloth. ? 5.  Apply the CHG Soap to  your body ONLY FROM THE NECK DOWN.   Do   not use on face/ open      ?  Wound or open sores. Avoid contact with eyes, ears mouth and   genitals (private parts).  ?                     Production manager,  Genitals (private parts) with your normal soap. ?            6.  Wash thoroughly, paying special attention to the area where your    surgery  will be performed. ? 7.  Thoroughly rinse your body with warm water from the neck down. ? 8.  DO NOT shower/wash with your normal soap after using and rinsing off the CHG Soap. ?               9.  Pat yourself dry with a clean towel. ?           10.  Wear clean pajamas. ?           11.  Place clean sheets on your bed the night of your first shower and do not  sleep with pets. ?Day of Surgery : ?Do not apply any lotions/deodorants the morning of surgery.  Please wear clean clothes to the hospital/surgery center. ? ?FAILURE TO FOLLOW THESE INSTRUCTIONS MAY RESULT IN THE CANCELLATION OF YOUR SURGERY ? ?PATIENT SIGNATURE_________________________________ ? ?NURSE SIGNATURE__________________________________ ? ?________________________________________________________________________  ?  ?

## 2021-11-27 NOTE — Progress Notes (Addendum)
COVID Vaccine Completed: No ?Date COVID Vaccine completed: ?Has received booster: ?COVID vaccine manufacturer: [employer] [address]  ?  ?Date of COVID positive in last 90 days: No ?  ?PCP - Heide Scales, NP (requested office note) ?Cardiologist - Dr. Peter Martinique ?  ?Chest x-ray - 11-02-21 Epic ?EKG - 11-05-21 Epic ?Stress Test - 09-03-21 Epic ?ECHO - greater than 2 years Epic ?Cardiac Cath - n/a ?Pacemaker/ICD device last checked: n/a ?Spinal Cord Stimulator: n/a ?  ?Bowel Prep - N/A ?  ?Sleep Study - yes, negative ?CPAP -  ?  ?Fasting Blood Sugar - Lowest 60 to 200 ?Checks Blood Sugar - 2-3 times a day ?  ?Blood Thinner Instructions: ?Aspirin Instructions: ASA 81, hold 5 days per patient ?Last Dose: ?  ?Activity level: Can go up a flight of stairs and perform activities of daily living without stopping and without symptoms of chest pain or shortness of breath.    Some limitations due to back pain              ?  ?Anesthesia review: CAD, HTN, asthma, DM2, COPD ? ?Recent hospitalization for asthma exacerbation.  Patient states that she is doing well now, still has some coughing but improving each day.   ?  ?Patient denies shortness of breath, fever,  and chest pain at PAT appointment ?   ?Patient verbalized understanding of instructions that were given to them at the PAT appointment. Patient was also instructed that they will need to review over the PAT instructions again at home before surgery.  ?

## 2021-11-28 ENCOUNTER — Encounter (HOSPITAL_COMMUNITY): Payer: Self-pay

## 2021-11-28 ENCOUNTER — Encounter (HOSPITAL_COMMUNITY)
Admission: RE | Admit: 2021-11-28 | Discharge: 2021-11-28 | Disposition: A | Discharge status: Home / Self Care | Payer: Medicare Other | Source: Ambulatory Visit | Attending: Urology | Admitting: Urology

## 2021-11-28 ENCOUNTER — Other Ambulatory Visit: Payer: Self-pay

## 2021-11-28 VITALS — BP 111/92 | HR 69 | Temp 98.4°F | Resp 20 | Ht 63.0 in | Wt 267.4 lb

## 2021-11-28 DIAGNOSIS — D649 Anemia, unspecified: Secondary | ICD-10-CM | POA: Insufficient documentation

## 2021-11-28 DIAGNOSIS — I2510 Atherosclerotic heart disease of native coronary artery without angina pectoris: Secondary | ICD-10-CM | POA: Diagnosis not present

## 2021-11-28 DIAGNOSIS — Z01812 Encounter for preprocedural laboratory examination: Secondary | ICD-10-CM | POA: Diagnosis not present

## 2021-11-28 DIAGNOSIS — R310 Gross hematuria: Secondary | ICD-10-CM | POA: Diagnosis not present

## 2021-11-28 DIAGNOSIS — N329 Bladder disorder, unspecified: Secondary | ICD-10-CM | POA: Diagnosis not present

## 2021-11-28 LAB — BASIC METABOLIC PANEL
Anion gap: 9 (ref 5–15)
BUN: 25 mg/dL — ABNORMAL HIGH (ref 6–20)
CO2: 24 mmol/L (ref 22–32)
Calcium: 9 mg/dL (ref 8.9–10.3)
Chloride: 105 mmol/L (ref 98–111)
Creatinine, Ser: 0.79 mg/dL (ref 0.44–1.00)
GFR, Estimated: >60 mL/min (ref >60)
Glucose, Bld: 165 mg/dL — ABNORMAL HIGH (ref 70–99)
Potassium: 4.3 mmol/L (ref 3.5–5.1)
Sodium: 138 mmol/L (ref 135–145)

## 2021-11-28 LAB — GLUCOSE, CAPILLARY: Glucose-Capillary: 170 mg/dL — ABNORMAL HIGH (ref 70–99)

## 2021-11-28 LAB — CBC
HCT: 42.4 % (ref 36.0–46.0)
Hemoglobin: 13.8 g/dL (ref 12.0–15.0)
MCH: 28.8 pg (ref 26.0–34.0)
MCHC: 32.5 g/dL (ref 30.0–36.0)
MCV: 88.3 fL (ref 80.0–100.0)
Platelets: 173 10*3/uL (ref 150–400)
RBC: 4.8 MIL/uL (ref 3.87–5.11)
RDW: 15.5 % (ref 11.5–15.5)
WBC: 6.5 10*3/uL (ref 4.0–10.5)
nRBC: 0 % (ref 0.0–0.2)

## 2021-12-01 ENCOUNTER — Ambulatory Visit: Payer: Medicare Other | Admitting: Cardiology

## 2021-12-03 DIAGNOSIS — Z0001 Encounter for general adult medical examination with abnormal findings: Secondary | ICD-10-CM | POA: Diagnosis not present

## 2021-12-03 DIAGNOSIS — R770 Abnormality of albumin: Secondary | ICD-10-CM | POA: Diagnosis not present

## 2021-12-03 DIAGNOSIS — Z720 Tobacco use: Secondary | ICD-10-CM | POA: Diagnosis not present

## 2021-12-03 DIAGNOSIS — Z1159 Encounter for screening for other viral diseases: Secondary | ICD-10-CM | POA: Diagnosis not present

## 2021-12-05 ENCOUNTER — Encounter (HOSPITAL_COMMUNITY): Payer: Self-pay | Admitting: Urology

## 2021-12-05 ENCOUNTER — Ambulatory Visit (HOSPITAL_COMMUNITY): Payer: Medicare Other

## 2021-12-05 ENCOUNTER — Encounter (HOSPITAL_COMMUNITY)
Admission: RE | Disposition: A | Discharge status: Home / Self Care | Payer: Self-pay | Source: Home / Self Care | Attending: Urology

## 2021-12-05 ENCOUNTER — Ambulatory Visit (HOSPITAL_COMMUNITY): Payer: Medicare Other | Admitting: Physician Assistant

## 2021-12-05 ENCOUNTER — Ambulatory Visit (HOSPITAL_COMMUNITY)
Admission: RE | Admit: 2021-12-05 | Discharge: 2021-12-05 | Disposition: A | Discharge status: Home / Self Care | Payer: Medicare Other | Attending: Urology | Admitting: Urology

## 2021-12-05 ENCOUNTER — Ambulatory Visit (HOSPITAL_BASED_OUTPATIENT_CLINIC_OR_DEPARTMENT_OTHER): Payer: Medicare Other | Admitting: Anesthesiology

## 2021-12-05 DIAGNOSIS — M797 Fibromyalgia: Secondary | ICD-10-CM | POA: Insufficient documentation

## 2021-12-05 DIAGNOSIS — N3001 Acute cystitis with hematuria: Secondary | ICD-10-CM | POA: Diagnosis not present

## 2021-12-05 DIAGNOSIS — I10 Essential (primary) hypertension: Secondary | ICD-10-CM

## 2021-12-05 DIAGNOSIS — Z87440 Personal history of urinary (tract) infections: Secondary | ICD-10-CM | POA: Diagnosis not present

## 2021-12-05 DIAGNOSIS — F17210 Nicotine dependence, cigarettes, uncomplicated: Secondary | ICD-10-CM | POA: Diagnosis not present

## 2021-12-05 DIAGNOSIS — Z87442 Personal history of urinary calculi: Secondary | ICD-10-CM | POA: Insufficient documentation

## 2021-12-05 DIAGNOSIS — I2510 Atherosclerotic heart disease of native coronary artery without angina pectoris: Secondary | ICD-10-CM | POA: Diagnosis not present

## 2021-12-05 DIAGNOSIS — J449 Chronic obstructive pulmonary disease, unspecified: Secondary | ICD-10-CM | POA: Diagnosis not present

## 2021-12-05 DIAGNOSIS — E119 Type 2 diabetes mellitus without complications: Secondary | ICD-10-CM | POA: Diagnosis not present

## 2021-12-05 DIAGNOSIS — F419 Anxiety disorder, unspecified: Secondary | ICD-10-CM | POA: Diagnosis not present

## 2021-12-05 DIAGNOSIS — R310 Gross hematuria: Secondary | ICD-10-CM | POA: Diagnosis not present

## 2021-12-05 DIAGNOSIS — N3021 Other chronic cystitis with hematuria: Secondary | ICD-10-CM | POA: Diagnosis not present

## 2021-12-05 DIAGNOSIS — K219 Gastro-esophageal reflux disease without esophagitis: Secondary | ICD-10-CM | POA: Insufficient documentation

## 2021-12-05 DIAGNOSIS — N3020 Other chronic cystitis without hematuria: Secondary | ICD-10-CM | POA: Diagnosis not present

## 2021-12-05 DIAGNOSIS — N3289 Other specified disorders of bladder: Secondary | ICD-10-CM | POA: Insufficient documentation

## 2021-12-05 DIAGNOSIS — R350 Frequency of micturition: Secondary | ICD-10-CM | POA: Insufficient documentation

## 2021-12-05 DIAGNOSIS — Z7985 Long-term (current) use of injectable non-insulin antidiabetic drugs: Secondary | ICD-10-CM | POA: Diagnosis not present

## 2021-12-05 DIAGNOSIS — Z79899 Other long term (current) drug therapy: Secondary | ICD-10-CM | POA: Insufficient documentation

## 2021-12-05 DIAGNOSIS — K589 Irritable bowel syndrome without diarrhea: Secondary | ICD-10-CM | POA: Diagnosis not present

## 2021-12-05 DIAGNOSIS — Z6841 Body Mass Index (BMI) 40.0 and over, adult: Secondary | ICD-10-CM | POA: Diagnosis not present

## 2021-12-05 DIAGNOSIS — I781 Nevus, non-neoplastic: Secondary | ICD-10-CM | POA: Diagnosis not present

## 2021-12-05 HISTORY — PX: CYSTOSCOPY WITH BIOPSY: SHX5122

## 2021-12-05 LAB — GLUCOSE, CAPILLARY
Glucose-Capillary: 150 mg/dL — ABNORMAL HIGH (ref 70–99)
Glucose-Capillary: 136 mg/dL — ABNORMAL HIGH (ref 70–99)

## 2021-12-05 SURGERY — CYSTOSCOPY, WITH BIOPSY
Anesthesia: General | Site: Bladder

## 2021-12-05 MED ORDER — ONDANSETRON HCL 4 MG/2ML IJ SOLN: 4.0000 mg | Freq: Once | INTRAMUSCULAR | Status: DC | PRN

## 2021-12-05 MED ORDER — LIDOCAINE HCL (PF) 2 % IJ SOLN
INTRAMUSCULAR | Status: AC
  Filled 2021-12-05: qty 5

## 2021-12-05 MED ORDER — FENTANYL CITRATE (PF) 100 MCG/2ML IJ SOLN
INTRAMUSCULAR | Status: AC
  Filled 2021-12-05: qty 2

## 2021-12-05 MED ORDER — ACETAMINOPHEN 10 MG/ML IV SOLN
1000.0000 mg | Freq: Once | INTRAVENOUS | Status: DC | PRN
  Administered 2021-12-05: 1000 mg via INTRAVENOUS

## 2021-12-05 MED ORDER — MIDAZOLAM HCL 2 MG/2ML IJ SOLN
INTRAMUSCULAR | Status: AC
  Filled 2021-12-05: qty 2

## 2021-12-05 MED ORDER — FLUCONAZOLE IN SODIUM CHLORIDE 200-0.9 MG/100ML-% IV SOLN
200.0000 mg | Freq: Once | INTRAVENOUS | Status: AC
  Administered 2021-12-05: 200 mg via INTRAVENOUS
  Filled 2021-12-05: qty 100

## 2021-12-05 MED ORDER — ACETAMINOPHEN 500 MG PO TABS: 1000.0000 mg | ORAL_TABLET | Freq: Once | ORAL | Status: DC | PRN

## 2021-12-05 MED ORDER — SUCCINYLCHOLINE CHLORIDE 200 MG/10ML IV SOSY
PREFILLED_SYRINGE | INTRAVENOUS | Status: DC | PRN
  Administered 2021-12-05: 140 mg via INTRAVENOUS

## 2021-12-05 MED ORDER — LIDOCAINE HCL URETHRAL/MUCOSAL 2 % EX GEL
CUTANEOUS | Status: DC | PRN
Start: 2021-12-05 — End: 2021-12-05
  Administered 2021-12-05: 1

## 2021-12-05 MED ORDER — ALBUTEROL SULFATE (2.5 MG/3ML) 0.083% IN NEBU
2.5000 mg | INHALATION_SOLUTION | Freq: Once | RESPIRATORY_TRACT | Status: AC
  Administered 2021-12-05: 2.5 mg via RESPIRATORY_TRACT

## 2021-12-05 MED ORDER — ORAL CARE MOUTH RINSE: 15.0000 mL | Freq: Once | OROMUCOSAL | Status: AC

## 2021-12-05 MED ORDER — CIPROFLOXACIN IN D5W 400 MG/200ML IV SOLN
400.0000 mg | Freq: Once | INTRAVENOUS | Status: AC
  Administered 2021-12-05: 400 mg via INTRAVENOUS

## 2021-12-05 MED ORDER — STERILE WATER FOR IRRIGATION IR SOLN
Status: DC | PRN
  Administered 2021-12-05 (×2): 3000 mL

## 2021-12-05 MED ORDER — SODIUM CHLORIDE 0.9 % IR SOLN
Status: DC | PRN
  Administered 2021-12-05: 1000 mL

## 2021-12-05 MED ORDER — ONDANSETRON HCL 4 MG/2ML IJ SOLN
INTRAMUSCULAR | Status: DC | PRN
  Administered 2021-12-05: 4 mg via INTRAVENOUS

## 2021-12-05 MED ORDER — HYDROMORPHONE HCL 1 MG/ML IJ SOLN
0.2500 mg | INTRAMUSCULAR | Status: DC | PRN
  Administered 2021-12-05: 0.5 mg via INTRAVENOUS

## 2021-12-05 MED ORDER — ACETAMINOPHEN 160 MG/5ML PO SOLN: 1000.0000 mg | Freq: Once | ORAL | Status: DC | PRN

## 2021-12-05 MED ORDER — ESMOLOL HCL 100 MG/10ML IV SOLN
INTRAVENOUS | Status: AC
  Filled 2021-12-05: qty 10

## 2021-12-05 MED ORDER — OXYBUTYNIN CHLORIDE 5 MG PO TABS
5.0000 mg | ORAL_TABLET | Freq: Three times a day (TID) | ORAL | 0 refills | Status: DC | PRN
Start: 2021-12-05 — End: 2022-03-26

## 2021-12-05 MED ORDER — CHLORHEXIDINE GLUCONATE 0.12 % MT SOLN
15.0000 mL | Freq: Once | OROMUCOSAL | Status: AC
  Administered 2021-12-05: 15 mL via OROMUCOSAL

## 2021-12-05 MED ORDER — PROMETHAZINE HCL 25 MG/ML IJ SOLN
6.2500 mg | Freq: Once | INTRAMUSCULAR | Status: AC
  Administered 2021-12-05: 6.25 mg via INTRAVENOUS

## 2021-12-05 MED ORDER — CIPROFLOXACIN IN D5W 400 MG/200ML IV SOLN
INTRAVENOUS | Status: AC
  Filled 2021-12-05: qty 200

## 2021-12-05 MED ORDER — IOHEXOL 300 MG/ML  SOLN
INTRAMUSCULAR | Status: DC | PRN
  Administered 2021-12-05: 19 mL

## 2021-12-05 MED ORDER — DEXMEDETOMIDINE (PRECEDEX) IN NS 20 MCG/5ML (4 MCG/ML) IV SYRINGE
PREFILLED_SYRINGE | INTRAVENOUS | Status: AC
  Filled 2021-12-05: qty 5

## 2021-12-05 MED ORDER — ESMOLOL HCL 100 MG/10ML IV SOLN
INTRAVENOUS | Status: DC | PRN
  Administered 2021-12-05: 50 mg via INTRAVENOUS

## 2021-12-05 MED ORDER — DEXAMETHASONE SODIUM PHOSPHATE 10 MG/ML IJ SOLN
INTRAMUSCULAR | Status: DC | PRN
  Administered 2021-12-05: 10 mg via INTRAVENOUS

## 2021-12-05 MED ORDER — ACETAMINOPHEN 10 MG/ML IV SOLN
INTRAVENOUS | Status: AC
  Filled 2021-12-05: qty 100

## 2021-12-05 MED ORDER — HYDROMORPHONE HCL 1 MG/ML IJ SOLN
INTRAMUSCULAR | Status: AC
  Filled 2021-12-05: qty 2

## 2021-12-05 MED ORDER — PROMETHAZINE HCL 25 MG/ML IJ SOLN
INTRAMUSCULAR | Status: AC
  Filled 2021-12-05: qty 1

## 2021-12-05 MED ORDER — ALBUTEROL SULFATE (2.5 MG/3ML) 0.083% IN NEBU
INHALATION_SOLUTION | RESPIRATORY_TRACT | Status: DC
Start: 2021-12-05 — End: 2021-12-05
  Filled 2021-12-05: qty 3

## 2021-12-05 MED ORDER — LACTATED RINGERS IV SOLN: INTRAVENOUS | Status: DC

## 2021-12-05 SURGICAL SUPPLY — 14 items
BAG DRN RND TRDRP ANRFLXCHMBR (UROLOGICAL SUPPLIES)
BAG URINE DRAIN 2000ML AR STRL (UROLOGICAL SUPPLIES) IMPLANT
BAG URO CATCHER STRL LF (MISCELLANEOUS) ×3 IMPLANT
DRAPE FOOT SWITCH (DRAPES) ×3 IMPLANT
GLOVE SURG LX 7.5 STRW (GLOVE) ×1
GLOVE SURG LX STRL 7.5 STRW (GLOVE) ×2 IMPLANT
GOWN STRL REUS W/ TWL XL LVL3 (GOWN DISPOSABLE) ×2 IMPLANT
GOWN STRL REUS W/TWL XL LVL3 (GOWN DISPOSABLE) ×2
KIT TURNOVER KIT A (KITS) IMPLANT
LOOP CUT BIPOLAR 24F LRG (ELECTROSURGICAL) IMPLANT
MANIFOLD NEPTUNE II (INSTRUMENTS) ×3 IMPLANT
PACK CYSTO (CUSTOM PROCEDURE TRAY) ×3 IMPLANT
TUBING CONNECTING 10 (TUBING) ×3 IMPLANT
TUBING UROLOGY SET (TUBING) ×3 IMPLANT

## 2021-12-05 NOTE — Interval H&P Note (Signed)
History and Physical Interval Note: ? ?12/05/2021 ?3:25 PM ? ?[name]  has presented today for surgery, with the diagnosis of BLADDER ERYTHEMA, GROSS HEMATURIA.  The various methods of treatment have been discussed with the patient and family. After consideration of risks, benefits and other options for treatment, the patient has consented to  Procedure(s): ?CYSTOSCOPY WITH BLADDER BIOPSY/ FULGURATION/BILATERAL RETROGRADE (N/A) as a surgical intervention.  The patient's history has been reviewed, patient examined, no change in status, stable for surgery.  I have reviewed the patient's chart and labs. She has red urine and off - even this AM. No dysuria but it "hurts to pee". No fever. Questions were answered to the patient's satisfaction.   ? ? ?Gina [name] ? ? ?

## 2021-12-05 NOTE — Transfer of Care (Signed)
Immediate Anesthesia Transfer of Care Note ? ?Patient: Gina [name] ? ?Procedure(s) Performed: Procedure(s): ?CYSTOSCOPY WITH BLADDER BIOPSY/ FULGURATION/ left bladder wall BILATERAL RETROGRADE (N/A) ? ?Patient Location: PACU ? ?Anesthesia Type:General ? ?Level of Consciousness: Alert, Awake, Oriented ? ?Airway & Oxygen Therapy: Patient Spontanous Breathing ? ?Post-op Assessment: Report given to RN ? ?Post vital signs: Reviewed and stable ? ?Last Vitals:  ?Vitals:  ? 12/05/21 1148  ?BP: 131/76  ?Pulse: 70  ?Resp: 20  ?Temp: 36.6 ?C  ?SpO2: 96%  ? ? ?Complications: No apparent anesthesia complications ? ?

## 2021-12-05 NOTE — Op Note (Signed)
Preoperative diagnosis: Gross hematuria, bladder erythema ?Postoperative diagnosis: Same ? ?Procedure: Cystoscopy with bilateral retrograde pyelogram, bladder biopsy and fulguration 0.5 to 2 cm ? ?Surgeon: Junious Silk ? ?Anesthesia: General ? ?Indication for procedure: Gina [name] is a 56 year old female that had bladder pain and gross hematuria.  On office cystoscopy she had erythematous and ulcerated areas.  She is brought today for biopsy. ? ?Findings: On exam under anesthesia the vulva appeared normal without lesion.  The meatus appeared normal.  The bladder and urethra palpably normal. ? ?On cystoscopy the urethra was unremarkable, the trigone and ureteral orifice ease were in the normal orthotopic position.  There was clear E flux.  There was no stone or foreign body.  There were no papillary mucosal lesions but there was a bright red erythematous and edematous area right superior and left lateral.  The right superior area was too far to get up to for biopsy.  She has a large capacity bladder.  The left side which was a very representative area was biopsied x2.  If she has some malignancy we might consider coming back with the loop to reach up and be more aggressive on the right superior but that is going to be a difficult area to reach and risk perforation. ? ?Description of procedure: After consent was obtained patient brought to the operating room.  After adequate anesthesia she was placed lithotomy position and prepped and draped in the usual sterile fashion.  Timeout was performed confirm the patient and procedure.  Exam under anesthesia was performed.  Cystoscope was passed per urethra and the bladder was carefully inspected with a 30 degree and 70 degree lens.  ? ? I then took the 30 degree lens and the the left ureteral orifice was cannulated with a 5 [place] open-ended catheter and retrograde injection of contrast was performed here with scout imaging and similarly the right ureteral orifice was cannulated  and retrograde injection of contrast performed.  Scout imaging performed with fluoroscopy. ? ?Then the cystoscope with the Bugbee was used to map out where to biopsy on the left and then tried to plan a biopsy right superior but it was very difficult to reach.  I was worried about reaching up that high and being able to control bleeding as well as perforation.  We will revisit that area if we need to.  The cold cup biopsy were then used to biopsy the left erythematous area x2.  The entire lesion was then fulgurated with the Bugbee which was about 2 cm.  Hemostasis was excellent low-pressure.  Bladder carefully inspected no other areas of concern noted.  Scope was backed out and lidocaine jelly instilled per urethra.  She was awakened to recovery in stable condition. ? ?Complications: None ? ?Blood loss: Minimal ? ?Specimens to pathology: ?Left bladder biopsy ? ?Drains: None ? ?Disposition: Patient stable to PACU ?

## 2021-12-05 NOTE — Anesthesia Procedure Notes (Signed)
Procedure Name: Intubation ?Date/Time: 12/05/2021 3:58 PM ?Performed by: Gean Maidens, CRNA ?Pre-anesthesia Checklist: Patient identified, Emergency Drugs available, Suction available, Patient being monitored and Timeout performed ?Patient Re-evaluated:Patient Re-evaluated prior to induction ?Oxygen Delivery Method: Circle system utilized ?Preoxygenation: Pre-oxygenation with 100% oxygen ?Induction Type: IV induction ?Ventilation: Mask ventilation without difficulty ?Laryngoscope Size: Mac and 4 ?Grade View: Grade I ?Tube type: Oral ?Tube size: 7.0 mm ?Number of attempts: 1 ?Airway Equipment and Method: Stylet ?Placement Confirmation: ETT inserted through vocal cords under direct vision, positive ETCO2 and breath sounds checked- equal and bilateral ?Secured at: 21 cm ?Tube secured with: Tape ?Dental Injury: Teeth and Oropharynx as per pre-operative assessment  ? ? ? ? ?

## 2021-12-05 NOTE — Anesthesia Preprocedure Evaluation (Addendum)
Anesthesia Evaluation  ?Patient identified by MRN, date of birth, ID band ?Patient awake ? ? ? ?Reviewed: ?Allergy & Precautions, NPO status , Patient's Chart, lab work & pertinent test results ? ?History of Anesthesia Complications ?(+) PONV, Family history of anesthesia reaction and history of anesthetic complications ? ?Airway ?Mallampati: III ? ?TM Distance: >3 FB ?Neck ROM: Full ? ? ? Dental ? ?(+)  ?  ?Pulmonary ?shortness of breath, asthma , COPD, Current Smoker and Patient abstained from smoking.,  ?  ?breath sounds clear to auscultation ? ? ? ? ? ? Cardiovascular ?hypertension, Pt. on medications ?+ CAD  ? ?Rhythm:Regular  ??  The study is normal. The study is low risk. ??  No ST deviation was noted. ??  Left ventricular function is normal. End diastolic cavity size is mildly enlarged. End systolic cavity size is mildly enlarged. ??  Prior study available for comparison from 04/08/2015. ?? ?Normal stress nuclear study with breast attenuation but no ischemia.  Gated ejection fraction 55% with normal wall motion.  Mild left ventricular enlargement. ? ?  ?Neuro/Psych ? Headaches, PSYCHIATRIC DISORDERS Anxiety  Neuromuscular disease   ? GI/Hepatic ?Neg liver ROS, GERD  Medicated,  ?Endo/Other  ?diabetesMorbid obesity ? Renal/GU ?Renal diseaseLab Results ?     Component                Value               Date                 ?     CREATININE               0.79                11/28/2021           ?  ? ?  ?Musculoskeletal ? ?(+) Arthritis , Fibromyalgia - ? Abdominal ?  ?Peds ? Hematology ?Lab Results ?     Component                Value               Date                 ?     WBC                      6.5                 11/28/2021           ?     HGB                      13.8                11/28/2021           ?     HCT                      42.4                11/28/2021           ?     MCV                      88.3                11/28/2021           ?  PLT                       173                 11/28/2021           ?   ?Anesthesia Other Findings ? ? Reproductive/Obstetrics ? ?  ? ? ? ? ? ? ? ? ? ? ? ? ? ?  ?  ? ? ? ? ? ? ? ?Anesthesia Physical ?Anesthesia Plan ? ?ASA: 3 ? ?Anesthesia Plan: General  ? ?Post-op Pain Management: Minimal or no pain anticipated  ? ?Induction: Intravenous ? ?PONV Risk Score and Plan: 3 and Ondansetron and Dexamethasone ? ?Airway Management Planned: Oral ETT ? ?Additional Equipment: None ? ?Intra-op Plan:  ? ?Post-operative Plan: Extubation in OR ? ?Informed Consent: I have reviewed the patients History and Physical, chart, labs and discussed the procedure including the risks, benefits and alternatives for the proposed anesthesia with the patient or authorized representative who has indicated his/her understanding and acceptance.  ? ? ? ?Dental advisory given ? ?Plan Discussed with: CRNA ? ?Anesthesia Plan Comments:   ? ? ? ? ? ? ?Anesthesia Quick Evaluation ? ?

## 2021-12-05 NOTE — H&P (Addendum)
? ?Office Visit Report     11/28/2021  ? ?-------------------------------------------------------------------------------- ?  ?Gina [name]  ?MRN: [id number]  ?DOB: 1965-11-22, 56 year old Female  ?SSN: [id number]  ? PRIMARY CARE:  Duard Larsen, NP  ?REFERRING:  Georgette Dover, MD  ?PROVIDER:  Festus Aloe, M.D.  ?TREATING:  Jiles Crocker, NP  ?LOCATION:  [facility] [phone]  ?  ? ?-------------------------------------------------------------------------------- ?  ?CC/HPI: F/u -  ? ?1- kidney stone - pt with left 16 mm LLP stone on surveillance taken for urgent left stent for a 5 mm left distal stone and UTI/spesis 08/21. F/u left URS/HLL/stent with string done 08/21. She pulled stent. Passed a few fragments.  ? ?Re-scanned December 2021, January, March, April, July 2022 all of which were benign - no significant stone burden --> This showed no stones or hydronephrosis. She also had a limited left renal ultrasound December 2022 when she complained of left flank pain and this was also normal. She does see a back doctor of [facility] - she is getting a nerve freeze.  ? ? ?2-h/o recurrent UTI - tried TMP prophylaxis in 2018. Cx + earlier in 2022 with 100,000 strep, July 2022 with yeast. Urine culture December 2022 negative. UA January 2023 with a few bacteria and no hematuria. She was seen December 2022 and given IM Rocephin for possible UTI, given morphine and then tramadol. Cx was negative. On nightly TMP.  ? ? ?3-frequency - She c/o pressure with voiding and sensation radiating out through urethra. She has IBS but not constipated. Abx have not helped these bladder symptoms. She has frequency and urgency.  ? ? ?4) MH/gross hematuria - noted on UA Dec 2022. Extensive uppertract imaging benign in 2022. Cysto today 02/23 - with erythema at dome and left wall. She noted red urine recently.  ? ? ?11/28/2021: Here today for preoperative appointment prior to undergoing cystoscopy, bladder  biopsy/fulguration and bilateral retrograde pyelogram with her urologist on 4/28.  ? ?Patient was hospitalized late last month for COPD exacerbation. Urine culture assessed at that time was negative for bacterial growth. She does continue suppressive trimethoprim. She had CT stone protocol study performed as well with complaints of flank and back pain. This revealed a cluster of small nonobstructing calculi in the left lower pole as well as bilateral renal scarring which is a stable finding for her. There were no findings to indicate GU obstruction, no other concerning mass or lesion identified.  ? ?Today patient denies any changes in past medical history, prescription medications taken on daily basis, she has had no interval surgical or procedural intervention. Since her recent COPD exacerbation, her breathing symptoms have significantly settled down. She is denying any chest pain or shortness of breath currently. She continues to have lower back pain. Also complaining of continued intermittent dysuria as well as gross hematuria. She denies recent fevers or chills, nausea/vomiting.  ? ?  ?ALLERGIES: Darvocet-N 100 TABS ?Fentanyl ?Latex ?Morphine - Hives, Skin Rash ?Penicillins - Hives, Nausea ?  ? ?MEDICATIONS: Lipitor 40 mg tablet tablet  ?Omeprazole 20 mg tablet, delayed release 1 capsule  ?Trimethoprim 100 mg tablet 1 tablet PO Q HS  ?Advair Diskus 250 mcg-50 mcg/dose blister, with inhalation device  ?Alprazolam 1 mg tablet  ?Lyrica  ?Nystatin  ?Ozempic  ?Tylenol  ?Ventolin Hfa 90 mcg hfa aerosol with adapter  ?  ? ?GU PSH: Cystoscopy - 10/06/2021, 01/09/2020 ?Cystoscopy Insert Stent, Left - 03/18/2020 ?Cystoscopy Ureteroscopy ?Hysterectomy Unilat SO -  2009 ?Locm 300-'399Mg'$ /Ml Iodine,1Ml - 01/26/2020 ?Ovary Removal Partial or Total - 2015 ?Ureteroscopic laser litho, Left - 04/09/2020 ? ?  ?   ?PSH Notes: Oophorectomy, Cholecystectomy, Cesarean Section, Wrist Surgery, Hysterectomy  ? ?NON-GU PSH: Cesarean Delivery Only -  2009 ?Cholecystectomy (open) - 2009 ?Hand/finger Surgery ?Leg/ankle Surgery Procedure ? ?  ? ?GU PMH: Bladder disorder, Unspec, we went over the nature r/b/a to cysto, bbx, fulg and bilateral RGP and she will proceed. - 10/06/2021 ?Microscopic hematuria - 10/06/2021, - 03/13/2021, - 2021 ?Flank Pain, Reassured her the back pain is NOT related to her kidneys or infection. She has no kidney stones on her CT scans. - 08/14/2021, - 08/08/2021, - 03/13/2021, - 02/26/2021 ?Urinary Frequency, Trial of solifenacin. - 08/14/2021 ?Chronic cystitis (w/o hematuria) - 08/08/2021, - 03/13/2021, - 02/26/2021, - 02/07/2021, - 11/06/2020, UA looks good. , - 05/14/2020, Chronic cystitis, - 2015 ?Pyelonephritis - 02/07/2021, - 11/06/2020 (Stable), - 08/22/2020, Pyelonephritis, acute, - 2014 ?Renal calculus, add renal US to KUB Dec 2021. - 05/14/2020, - 01/09/2020, Kidney stone on left side, - 2015 ?Gross hematuria (Stable), assess with CT next available. Cysto benign. I'll see her back in 6 mo with a KUB - 01/09/2020, - 2021 ?Kidney, Unspecified, Neoplasm of uncertain behavior - 7106 ?History of urolithiasis, Nephrolithiasis - 2014 ?  ?   ?PMH Notes:  ?2007-10-05 08:48:14 - Note: Arthritis  ? ?NON-GU PMH: Encounter for general adult medical examination without abnormal findings, Encounter for preventive health examination - 2015 ?Personal history of other diseases of the musculoskeletal system and connective tissue, History of gout - 2015 ?Anxiety, Anxiety - 2014 ?Asthma, Asthma - 2014 ?Personal history of other diseases of the circulatory system, History of hypertension - 2014 ?Personal history of other diseases of the digestive system, History of esophageal reflux - 2014 ?Personal history of other endocrine, nutritional and metabolic disease, History of diabetes mellitus - 2014, History of hypercholesterolemia, - 2014 ?Arthritis ?Diabetes Type 2 ?Gout ?Heartburn ?Hypercholesterolemia ?Hypertension ?  ? ?FAMILY HISTORY: Death In The Family Father - Runs In  Family ?Death In The Family Mother - Runs In Family ?Diabetes - Father ?Family Health Status Number - Runs In Family ?heart failure - Runs In Family ?Hematuria - Father ?Legionnaire's Disease - Father ?liver cancer - Runs In Family ?ovarian cancer - Mother ?renal failure - Father  ? ?SOCIAL HISTORY: Marital Status: Married ?Preferred Language: [place]; Ethnicity: Not Hispanic Or Latino; Race: White ?Current Smoking Status: Patient smokes. Has smoked since 03/10/1997. Smokes 1 pack per day.  ? ?Tobacco Use Assessment Completed: Used Tobacco in last 30 days? ?Has never drank.  ?Drinks 4+ caffeinated drinks per day. ?Patient's occupation is/was Disabled. ?  ?  Notes: Current every day smoker, Alcohol Use, Caffeine Use, Marital History - Currently Married, Occupation:, Tobacco Use  ? ?REVIEW OF SYSTEMS:    ?GU Review Female:   Patient denies frequent urination, hard to postpone urination, burning /pain with urination, get up at night to urinate, leakage of urine, stream starts and stops, trouble starting your stream, have to strain to urinate, and being pregnant.  ?Gastrointestinal (Upper):   Patient denies nausea, vomiting, and indigestion/ heartburn.  ?Gastrointestinal (Lower):   Patient denies diarrhea and constipation.  ?Constitutional:   Patient denies fever, night sweats, weight loss, and fatigue.  ?Skin:   Patient denies skin rash/ lesion and itching.  ?Eyes:   Patient denies blurred vision and double vision.  ?Ears/ Nose/ Throat:   Patient denies sinus problems and sore throat.  ?Hematologic/Lymphatic:  Patient denies swollen glands and easy bruising.  ?Cardiovascular:   Patient denies leg swelling and chest pains.  ?Respiratory:   Patient denies cough and shortness of breath.  ?Endocrine:   Patient denies excessive thirst.  ?Musculoskeletal:   Patient denies back pain and joint pain.  ?Neurological:   Patient denies headaches and dizziness.  ?Psychologic:   Patient denies depression and anxiety.  ? ?Notes:  lower abdomen pain ?  ? ?VITAL SIGNS:    ?  11/28/2021 01:48 PM  ?Weight 270 lb / 122.47 kg  ?Height 63 in / 160.02 cm  ?BP 153/67 mmHg  ?Heart Rate 87 /min  ?Temperature 97.8 F / 36.5 C  ?BMI 47.8 kg/m?

## 2021-12-07 NOTE — Anesthesia Postprocedure Evaluation (Signed)
Anesthesia Post Note ? ?Patient: SHANEL PRAZAK ? ?Procedure(s) Performed: CYSTOSCOPY WITH BLADDER BIOPSY/ FULGURATION/ left bladder wall BILATERAL RETROGRADE (Bladder) ? ?  ? ?Patient location during evaluation: PACU ?Anesthesia Type: General ?Level of consciousness: awake and alert ?Pain management: pain level controlled ?Vital Signs Assessment: post-procedure vital signs reviewed and stable ?Respiratory status: spontaneous breathing, nonlabored ventilation, respiratory function stable and patient connected to nasal cannula oxygen ?Cardiovascular status: blood pressure returned to baseline and stable ?Postop Assessment: no apparent nausea or vomiting ?Anesthetic complications: no ? ? ?No notable events documented. ? ?Last Vitals:  ?Vitals:  ? 12/05/21 1735 12/05/21 1745  ?BP: (!) 145/66 131/68  ?Pulse: 63 69  ?Resp: 15 14  ?Temp:    ?SpO2: 93% 95%  ?  ?Last Pain:  ?Vitals:  ? 12/05/21 1745  ?TempSrc:   ?PainSc: 7   ? ? ?  ?  ?  ?  ?  ?  ? ?Santa Lighter ? ? ? ? ?

## 2021-12-08 ENCOUNTER — Encounter (HOSPITAL_COMMUNITY): Payer: Self-pay | Admitting: Urology

## 2021-12-09 LAB — SURGICAL PATHOLOGY

## 2021-12-11 DIAGNOSIS — M25511 Pain in right shoulder: Secondary | ICD-10-CM | POA: Diagnosis not present

## 2021-12-11 DIAGNOSIS — M546 Pain in thoracic spine: Secondary | ICD-10-CM | POA: Diagnosis not present

## 2021-12-11 DIAGNOSIS — M2578 Osteophyte, vertebrae: Secondary | ICD-10-CM | POA: Diagnosis not present

## 2021-12-11 DIAGNOSIS — M25462 Effusion, left knee: Secondary | ICD-10-CM | POA: Diagnosis not present

## 2021-12-11 DIAGNOSIS — S2241XA Multiple fractures of ribs, right side, initial encounter for closed fracture: Secondary | ICD-10-CM | POA: Diagnosis not present

## 2021-12-11 DIAGNOSIS — M1712 Unilateral primary osteoarthritis, left knee: Secondary | ICD-10-CM | POA: Diagnosis not present

## 2021-12-11 DIAGNOSIS — M1711 Unilateral primary osteoarthritis, right knee: Secondary | ICD-10-CM | POA: Diagnosis not present

## 2021-12-11 DIAGNOSIS — Z043 Encounter for examination and observation following other accident: Secondary | ICD-10-CM | POA: Diagnosis not present

## 2021-12-11 DIAGNOSIS — S46911A Strain of unspecified muscle, fascia and tendon at shoulder and upper arm level, right arm, initial encounter: Secondary | ICD-10-CM | POA: Diagnosis not present

## 2021-12-15 DIAGNOSIS — Z09 Encounter for follow-up examination after completed treatment for conditions other than malignant neoplasm: Secondary | ICD-10-CM | POA: Diagnosis not present

## 2021-12-15 DIAGNOSIS — M25569 Pain in unspecified knee: Secondary | ICD-10-CM | POA: Diagnosis not present

## 2021-12-15 DIAGNOSIS — M25511 Pain in right shoulder: Secondary | ICD-10-CM | POA: Diagnosis not present

## 2021-12-23 DIAGNOSIS — S8000XA Contusion of unspecified knee, initial encounter: Secondary | ICD-10-CM | POA: Diagnosis not present

## 2021-12-25 ENCOUNTER — Encounter: Payer: Self-pay | Admitting: Gastroenterology

## 2021-12-31 DIAGNOSIS — E1165 Type 2 diabetes mellitus with hyperglycemia: Secondary | ICD-10-CM | POA: Diagnosis not present

## 2021-12-31 DIAGNOSIS — M545 Low back pain, unspecified: Secondary | ICD-10-CM | POA: Diagnosis not present

## 2021-12-31 DIAGNOSIS — M25569 Pain in unspecified knee: Secondary | ICD-10-CM | POA: Diagnosis not present

## 2021-12-31 DIAGNOSIS — N390 Urinary tract infection, site not specified: Secondary | ICD-10-CM | POA: Diagnosis not present

## 2021-12-31 DIAGNOSIS — R82998 Other abnormal findings in urine: Secondary | ICD-10-CM | POA: Diagnosis not present

## 2021-12-31 DIAGNOSIS — Z79899 Other long term (current) drug therapy: Secondary | ICD-10-CM | POA: Diagnosis not present

## 2021-12-31 DIAGNOSIS — M25511 Pain in right shoulder: Secondary | ICD-10-CM | POA: Diagnosis not present

## 2021-12-31 DIAGNOSIS — T2101XA Burn of unspecified degree of chest wall, initial encounter: Secondary | ICD-10-CM | POA: Diagnosis not present

## 2022-01-02 DIAGNOSIS — R8290 Unspecified abnormal findings in urine: Secondary | ICD-10-CM | POA: Diagnosis not present

## 2022-01-09 DIAGNOSIS — R310 Gross hematuria: Secondary | ICD-10-CM | POA: Diagnosis not present

## 2022-01-09 DIAGNOSIS — N3020 Other chronic cystitis without hematuria: Secondary | ICD-10-CM | POA: Diagnosis not present

## 2022-01-13 DIAGNOSIS — M25569 Pain in unspecified knee: Secondary | ICD-10-CM | POA: Diagnosis not present

## 2022-01-13 DIAGNOSIS — M25511 Pain in right shoulder: Secondary | ICD-10-CM | POA: Diagnosis not present

## 2022-01-13 DIAGNOSIS — N390 Urinary tract infection, site not specified: Secondary | ICD-10-CM | POA: Diagnosis not present

## 2022-01-13 DIAGNOSIS — M545 Low back pain, unspecified: Secondary | ICD-10-CM | POA: Diagnosis not present

## 2022-01-15 DIAGNOSIS — M67911 Unspecified disorder of synovium and tendon, right shoulder: Secondary | ICD-10-CM | POA: Diagnosis not present

## 2022-01-15 DIAGNOSIS — M546 Pain in thoracic spine: Secondary | ICD-10-CM | POA: Diagnosis not present

## 2022-01-15 DIAGNOSIS — S2241XA Multiple fractures of ribs, right side, initial encounter for closed fracture: Secondary | ICD-10-CM | POA: Diagnosis not present

## 2022-01-15 DIAGNOSIS — M5451 Vertebrogenic low back pain: Secondary | ICD-10-CM | POA: Diagnosis not present

## 2022-01-15 DIAGNOSIS — M25511 Pain in right shoulder: Secondary | ICD-10-CM | POA: Diagnosis not present

## 2022-01-29 ENCOUNTER — Emergency Department (HOSPITAL_COMMUNITY)
Admission: EM | Admit: 2022-01-29 | Discharge: 2022-01-29 | Disposition: A | Discharge status: Home / Self Care | Payer: Medicare Other | Attending: Emergency Medicine | Admitting: Emergency Medicine

## 2022-01-29 ENCOUNTER — Emergency Department (HOSPITAL_COMMUNITY): Payer: Medicare Other

## 2022-01-29 ENCOUNTER — Encounter (HOSPITAL_COMMUNITY): Payer: Self-pay

## 2022-01-29 ENCOUNTER — Other Ambulatory Visit: Payer: Self-pay

## 2022-01-29 DIAGNOSIS — J45909 Unspecified asthma, uncomplicated: Secondary | ICD-10-CM | POA: Insufficient documentation

## 2022-01-29 DIAGNOSIS — R109 Unspecified abdominal pain: Secondary | ICD-10-CM

## 2022-01-29 DIAGNOSIS — E119 Type 2 diabetes mellitus without complications: Secondary | ICD-10-CM | POA: Insufficient documentation

## 2022-01-29 DIAGNOSIS — Z79899 Other long term (current) drug therapy: Secondary | ICD-10-CM | POA: Diagnosis not present

## 2022-01-29 DIAGNOSIS — R300 Dysuria: Secondary | ICD-10-CM | POA: Diagnosis present

## 2022-01-29 DIAGNOSIS — Z7982 Long term (current) use of aspirin: Secondary | ICD-10-CM | POA: Insufficient documentation

## 2022-01-29 DIAGNOSIS — R319 Hematuria, unspecified: Secondary | ICD-10-CM

## 2022-01-29 DIAGNOSIS — Z794 Long term (current) use of insulin: Secondary | ICD-10-CM | POA: Insufficient documentation

## 2022-01-29 DIAGNOSIS — I700 Atherosclerosis of aorta: Secondary | ICD-10-CM | POA: Diagnosis not present

## 2022-01-29 DIAGNOSIS — I10 Essential (primary) hypertension: Secondary | ICD-10-CM | POA: Diagnosis not present

## 2022-01-29 DIAGNOSIS — F17210 Nicotine dependence, cigarettes, uncomplicated: Secondary | ICD-10-CM | POA: Diagnosis not present

## 2022-01-29 DIAGNOSIS — N3090 Cystitis, unspecified without hematuria: Secondary | ICD-10-CM

## 2022-01-29 DIAGNOSIS — N3001 Acute cystitis with hematuria: Secondary | ICD-10-CM | POA: Diagnosis not present

## 2022-01-29 DIAGNOSIS — R1031 Right lower quadrant pain: Secondary | ICD-10-CM | POA: Diagnosis not present

## 2022-01-29 DIAGNOSIS — R0781 Pleurodynia: Secondary | ICD-10-CM | POA: Diagnosis not present

## 2022-01-29 DIAGNOSIS — N3091 Cystitis, unspecified with hematuria: Secondary | ICD-10-CM | POA: Insufficient documentation

## 2022-01-29 HISTORY — DX: Disorder of kidney and ureter, unspecified: N28.9

## 2022-01-29 LAB — URINALYSIS, ROUTINE W REFLEX MICROSCOPIC
Bilirubin Urine: NEGATIVE
Glucose, UA: >=500 mg/dL — AB
Ketones, ur: NEGATIVE mg/dL
Nitrite: NEGATIVE
Protein, ur: 100 mg/dL — AB
RBC / HPF: >50 RBC/hpf — ABNORMAL HIGH (ref 0–5)
Specific Gravity, Urine: 1.014 (ref 1.005–1.030)
WBC, UA: >50 WBC/hpf — ABNORMAL HIGH (ref 0–5)
pH: 5 (ref 5.0–8.0)

## 2022-01-29 LAB — COMPREHENSIVE METABOLIC PANEL
ALT: 23 U/L (ref 0–44)
AST: 29 U/L (ref 15–41)
Albumin: 3.5 g/dL (ref 3.5–5.0)
Alkaline Phosphatase: 69 U/L (ref 38–126)
Anion gap: 10 (ref 5–15)
BUN: 24 mg/dL — ABNORMAL HIGH (ref 6–20)
CO2: 21 mmol/L — ABNORMAL LOW (ref 22–32)
Calcium: 10 mg/dL (ref 8.9–10.3)
Chloride: 112 mmol/L — ABNORMAL HIGH (ref 98–111)
Creatinine, Ser: 1.1 mg/dL — ABNORMAL HIGH (ref 0.44–1.00)
GFR, Estimated: 59 mL/min — ABNORMAL LOW (ref >60)
Glucose, Bld: 142 mg/dL — ABNORMAL HIGH (ref 70–99)
Potassium: 4.8 mmol/L (ref 3.5–5.1)
Sodium: 143 mmol/L (ref 135–145)
Total Bilirubin: 0.4 mg/dL (ref 0.3–1.2)
Total Protein: 7 g/dL (ref 6.5–8.1)

## 2022-01-29 LAB — CBC
HCT: 41.3 % (ref 36.0–46.0)
Hemoglobin: 13.2 g/dL (ref 12.0–15.0)
MCH: 29.1 pg (ref 26.0–34.0)
MCHC: 32 g/dL (ref 30.0–36.0)
MCV: 91.2 fL (ref 80.0–100.0)
Platelets: 198 10*3/uL (ref 150–400)
RBC: 4.53 MIL/uL (ref 3.87–5.11)
RDW: 16.5 % — ABNORMAL HIGH (ref 11.5–15.5)
WBC: 5.3 10*3/uL (ref 4.0–10.5)
nRBC: 0 % (ref 0.0–0.2)

## 2022-01-29 LAB — LIPASE, BLOOD: Lipase: 54 U/L — ABNORMAL HIGH (ref 11–51)

## 2022-01-29 MED ORDER — IOHEXOL 300 MG/ML  SOLN
100.0000 mL | Freq: Once | INTRAMUSCULAR | Status: AC | PRN
  Administered 2022-01-29: 100 mL via INTRAVENOUS

## 2022-01-29 MED ORDER — HYDROMORPHONE HCL 1 MG/ML IJ SOLN
1.0000 mg | Freq: Once | INTRAMUSCULAR | Status: AC
  Administered 2022-01-29: 1 mg via INTRAVENOUS
  Filled 2022-01-29: qty 1

## 2022-01-29 MED ORDER — SODIUM CHLORIDE 0.9 % IV SOLN
1.0000 g | Freq: Once | INTRAVENOUS | Status: AC
  Administered 2022-01-29: 1 g via INTRAVENOUS
  Filled 2022-01-29: qty 10

## 2022-01-29 MED ORDER — HYDROMORPHONE HCL 1 MG/ML IJ SOLN
0.5000 mg | Freq: Once | INTRAMUSCULAR | Status: AC
  Administered 2022-01-29: 0.5 mg via INTRAVENOUS
  Filled 2022-01-29: qty 1

## 2022-01-29 MED ORDER — ONDANSETRON HCL 4 MG/2ML IJ SOLN
4.0000 mg | Freq: Once | INTRAMUSCULAR | Status: AC
  Administered 2022-01-29: 4 mg via INTRAVENOUS
  Filled 2022-01-29: qty 2

## 2022-01-29 MED ORDER — SODIUM CHLORIDE 0.9 % IV BOLUS
1000.0000 mL | Freq: Once | INTRAVENOUS | Status: AC
  Administered 2022-01-29: 1000 mL via INTRAVENOUS

## 2022-01-29 MED ORDER — NITROFURANTOIN MONOHYD MACRO 100 MG PO CAPS: 100.0000 mg | ORAL_CAPSULE | Freq: Two times a day (BID) | ORAL | 0 refills | Status: AC

## 2022-01-29 MED ORDER — PHENAZOPYRIDINE HCL 200 MG PO TABS: 200.0000 mg | ORAL_TABLET | Freq: Three times a day (TID) | ORAL | 0 refills | Status: DC

## 2022-01-29 MED ORDER — ONDANSETRON HCL 4 MG PO TABS: 4.0000 mg | ORAL_TABLET | ORAL | 0 refills | Status: DC | PRN

## 2022-01-29 NOTE — Discharge Instructions (Signed)
It was a pleasure caring for you today in the emergency department.  Please return to the emergency department for any worsening or worrisome symptoms.  Please follow-up with urologist

## 2022-01-29 NOTE — ED Provider Triage Note (Cosign Needed)
Emergency Medicine Provider Triage Evaluation Note  Gina [name] , a 56 y.o. female  was evaluated in triage.  Pt complains of diffuse abdominal pain, urinary frequency, dysuria. Denies chest pain, shortness of breath, diarrhea. Symptoms began one week ago.   Review of Systems  Positive: Abdominal pain, dysuria, frequency Negative: Chest pain, shortness of breath  Physical Exam  BP 124/63 (BP Location: Right Arm)   Pulse 81   Temp 98.3 F (36.8 C) (Oral)   Resp 18   Ht '5\' 4"'$  (1.626 m)   Wt 122.5 kg   SpO2 98%   BMI 46.35 kg/m  Gen:   Awake, no distress   Resp:  Normal effort  MSK:   Moves extremities without difficulty  Other:  Pannus feels firm  Medical Decision Making  Medically screening exam initiated at 8:44 AM.  Appropriate orders placed.  Gina [name] was informed that the remainder of the evaluation will be completed by another provider, this initial triage assessment does not replace that evaluation, and the importance of remaining in the ED until their evaluation is complete.     Dorothyann Peng, PA-C 01/29/22 [phone]

## 2022-01-29 NOTE — ED Triage Notes (Addendum)
Patient c/o RLQ abdominal pain, hematuria, dysuria, nausea, urinary frequency x 5 days.

## 2022-01-30 LAB — URINE CULTURE: Culture: <10000 — AB

## 2022-02-06 DIAGNOSIS — N3000 Acute cystitis without hematuria: Secondary | ICD-10-CM | POA: Diagnosis not present

## 2022-02-16 ENCOUNTER — Other Ambulatory Visit: Payer: Self-pay

## 2022-02-16 DIAGNOSIS — M25511 Pain in right shoulder: Secondary | ICD-10-CM

## 2022-02-23 DIAGNOSIS — E785 Hyperlipidemia, unspecified: Secondary | ICD-10-CM | POA: Diagnosis not present

## 2022-02-23 DIAGNOSIS — Z79899 Other long term (current) drug therapy: Secondary | ICD-10-CM | POA: Diagnosis not present

## 2022-02-23 DIAGNOSIS — N390 Urinary tract infection, site not specified: Secondary | ICD-10-CM | POA: Diagnosis not present

## 2022-02-23 DIAGNOSIS — R053 Chronic cough: Secondary | ICD-10-CM | POA: Diagnosis not present

## 2022-02-23 DIAGNOSIS — R300 Dysuria: Secondary | ICD-10-CM | POA: Diagnosis not present

## 2022-02-23 DIAGNOSIS — B372 Candidiasis of skin and nail: Secondary | ICD-10-CM | POA: Diagnosis not present

## 2022-02-23 DIAGNOSIS — M545 Low back pain, unspecified: Secondary | ICD-10-CM | POA: Diagnosis not present

## 2022-02-23 DIAGNOSIS — K219 Gastro-esophageal reflux disease without esophagitis: Secondary | ICD-10-CM | POA: Diagnosis not present

## 2022-02-23 DIAGNOSIS — Z5181 Encounter for therapeutic drug level monitoring: Secondary | ICD-10-CM | POA: Diagnosis not present

## 2022-02-23 DIAGNOSIS — E1100 Type 2 diabetes mellitus with hyperosmolarity without nonketotic hyperglycemic-hyperosmolar coma (NKHHC): Secondary | ICD-10-CM | POA: Diagnosis not present

## 2022-03-01 ENCOUNTER — Ambulatory Visit
Admission: RE | Admit: 2022-03-01 | Discharge: 2022-03-01 | Disposition: A | Discharge status: Home / Self Care | Payer: Medicare Other | Source: Ambulatory Visit | Attending: Orthopedic Surgery | Admitting: Orthopedic Surgery

## 2022-03-01 DIAGNOSIS — M25511 Pain in right shoulder: Secondary | ICD-10-CM

## 2022-03-09 DIAGNOSIS — S46111A Strain of muscle, fascia and tendon of long head of biceps, right arm, initial encounter: Secondary | ICD-10-CM | POA: Diagnosis not present

## 2022-03-09 DIAGNOSIS — S46011A Strain of muscle(s) and tendon(s) of the rotator cuff of right shoulder, initial encounter: Secondary | ICD-10-CM | POA: Diagnosis not present

## 2022-03-09 DIAGNOSIS — S46811A Strain of other muscles, fascia and tendons at shoulder and upper arm level, right arm, initial encounter: Secondary | ICD-10-CM | POA: Diagnosis not present

## 2022-03-09 DIAGNOSIS — M25411 Effusion, right shoulder: Secondary | ICD-10-CM | POA: Diagnosis not present

## 2022-03-09 DIAGNOSIS — M7541 Impingement syndrome of right shoulder: Secondary | ICD-10-CM | POA: Diagnosis not present

## 2022-03-09 DIAGNOSIS — M62511 Muscle wasting and atrophy, not elsewhere classified, right shoulder: Secondary | ICD-10-CM | POA: Diagnosis not present

## 2022-03-09 DIAGNOSIS — M19011 Primary osteoarthritis, right shoulder: Secondary | ICD-10-CM | POA: Diagnosis not present

## 2022-03-10 ENCOUNTER — Telehealth: Payer: Self-pay | Admitting: *Deleted

## 2022-03-10 NOTE — Patient Outreach (Signed)
  Care Management   Outreach Note  03/10/2022 Name: Gina [name] MRN: [id number] DOB: 1965-12-04  An unsuccessful telephone outreach was attempted today. The patient was referred to the case management team for assistance with care management and care coordination.   Follow Up Plan:  The care management team will reach out to the patient again over the next 10 days.  Eduard Clos MSW, LCSW Licensed Clinical Social Worker     [phone]

## 2022-03-17 ENCOUNTER — Telehealth: Payer: Self-pay | Admitting: *Deleted

## 2022-03-17 NOTE — Patient Outreach (Signed)
  Care Coordination   03/17/2022 Name: Gina [name] MRN: [id number] DOB: 1965-08-15   Care Coordination Outreach Attempts:  A second unsuccessful outreach was attempted today to offer the patient with information about available care coordination services as a benefit of their health plan.     Follow Up Plan:  Additional outreach attempts will be made to offer the patient care coordination information and services.   Encounter Outcome:  No Answer  Care Coordination Interventions Activated:  No   Care Coordination Interventions:  No, not indicated    Eduard Clos MSW, LCSW Licensed Clinical Social Worker      [phone]

## 2022-03-18 ENCOUNTER — Emergency Department (HOSPITAL_COMMUNITY)
Admission: EM | Admit: 2022-03-18 | Discharge: 2022-03-18 | Disposition: A | Discharge status: Home / Self Care | Payer: Medicare Other | Attending: Emergency Medicine | Admitting: Emergency Medicine

## 2022-03-18 ENCOUNTER — Encounter (HOSPITAL_COMMUNITY): Payer: Self-pay

## 2022-03-18 ENCOUNTER — Emergency Department (HOSPITAL_COMMUNITY): Payer: Medicare Other

## 2022-03-18 DIAGNOSIS — N3001 Acute cystitis with hematuria: Secondary | ICD-10-CM | POA: Insufficient documentation

## 2022-03-18 DIAGNOSIS — Z7984 Long term (current) use of oral hypoglycemic drugs: Secondary | ICD-10-CM | POA: Insufficient documentation

## 2022-03-18 DIAGNOSIS — Z794 Long term (current) use of insulin: Secondary | ICD-10-CM | POA: Insufficient documentation

## 2022-03-18 DIAGNOSIS — Z9049 Acquired absence of other specified parts of digestive tract: Secondary | ICD-10-CM | POA: Diagnosis not present

## 2022-03-18 DIAGNOSIS — K439 Ventral hernia without obstruction or gangrene: Secondary | ICD-10-CM | POA: Diagnosis not present

## 2022-03-18 DIAGNOSIS — Z7982 Long term (current) use of aspirin: Secondary | ICD-10-CM | POA: Insufficient documentation

## 2022-03-18 DIAGNOSIS — R300 Dysuria: Secondary | ICD-10-CM | POA: Diagnosis present

## 2022-03-18 DIAGNOSIS — N200 Calculus of kidney: Secondary | ICD-10-CM | POA: Diagnosis not present

## 2022-03-18 DIAGNOSIS — N3289 Other specified disorders of bladder: Secondary | ICD-10-CM | POA: Diagnosis not present

## 2022-03-18 LAB — URINALYSIS, ROUTINE W REFLEX MICROSCOPIC
Bilirubin Urine: NEGATIVE
Glucose, UA: NEGATIVE mg/dL
Ketones, ur: NEGATIVE mg/dL
Nitrite: NEGATIVE
Protein, ur: 100 mg/dL — AB
RBC / HPF: >50 RBC/hpf — ABNORMAL HIGH (ref 0–5)
Specific Gravity, Urine: 1.013 (ref 1.005–1.030)
WBC, UA: >50 WBC/hpf — ABNORMAL HIGH (ref 0–5)
pH: 5 (ref 5.0–8.0)

## 2022-03-18 LAB — COMPREHENSIVE METABOLIC PANEL
ALT: 16 U/L (ref 0–44)
AST: 25 U/L (ref 15–41)
Albumin: 3.7 g/dL (ref 3.5–5.0)
Alkaline Phosphatase: 67 U/L (ref 38–126)
Anion gap: 7 (ref 5–15)
BUN: 22 mg/dL — ABNORMAL HIGH (ref 6–20)
CO2: 26 mmol/L (ref 22–32)
Calcium: 9.3 mg/dL (ref 8.9–10.3)
Chloride: 108 mmol/L (ref 98–111)
Creatinine, Ser: 0.99 mg/dL (ref 0.44–1.00)
GFR, Estimated: >60 mL/min (ref >60)
Glucose, Bld: 146 mg/dL — ABNORMAL HIGH (ref 70–99)
Potassium: 4.1 mmol/L (ref 3.5–5.1)
Sodium: 141 mmol/L (ref 135–145)
Total Bilirubin: 0.9 mg/dL (ref 0.3–1.2)
Total Protein: 7 g/dL (ref 6.5–8.1)

## 2022-03-18 LAB — CBC WITH DIFFERENTIAL/PLATELET
Abs Immature Granulocytes: 0.01 10*3/uL (ref 0.00–0.07)
Basophils Absolute: 0.1 10*3/uL (ref 0.0–0.1)
Basophils Relative: 1 %
Eosinophils Absolute: 0.4 10*3/uL (ref 0.0–0.5)
Eosinophils Relative: 7 %
HCT: 39.6 % (ref 36.0–46.0)
Hemoglobin: 12.8 g/dL (ref 12.0–15.0)
Immature Granulocytes: 0 %
Lymphocytes Relative: 27 %
Lymphs Abs: 1.6 10*3/uL (ref 0.7–4.0)
MCH: 29.2 pg (ref 26.0–34.0)
MCHC: 32.3 g/dL (ref 30.0–36.0)
MCV: 90.4 fL (ref 80.0–100.0)
Monocytes Absolute: 0.3 10*3/uL (ref 0.1–1.0)
Monocytes Relative: 6 %
Neutro Abs: 3.6 10*3/uL (ref 1.7–7.7)
Neutrophils Relative %: 59 %
Platelets: 170 10*3/uL (ref 150–400)
RBC: 4.38 MIL/uL (ref 3.87–5.11)
RDW: 15.6 % — ABNORMAL HIGH (ref 11.5–15.5)
WBC: 6 10*3/uL (ref 4.0–10.5)
nRBC: 0 % (ref 0.0–0.2)

## 2022-03-18 LAB — LIPASE, BLOOD: Lipase: 35 U/L (ref 11–51)

## 2022-03-18 MED ORDER — FENTANYL CITRATE PF 50 MCG/ML IJ SOSY
25.0000 ug | PREFILLED_SYRINGE | Freq: Once | INTRAMUSCULAR | Status: DC
  Filled 2022-03-18: qty 1

## 2022-03-18 MED ORDER — ONDANSETRON HCL 8 MG PO TABS: 8.0000 mg | ORAL_TABLET | Freq: Three times a day (TID) | ORAL | 0 refills | Status: DC | PRN

## 2022-03-18 MED ORDER — CIPROFLOXACIN HCL 500 MG PO TABS: 500.0000 mg | ORAL_TABLET | Freq: Two times a day (BID) | ORAL | 0 refills | Status: DC

## 2022-03-18 MED ORDER — ONDANSETRON HCL 4 MG/2ML IJ SOLN
4.0000 mg | Freq: Once | INTRAMUSCULAR | Status: AC
  Administered 2022-03-18: 4 mg via INTRAVENOUS
  Filled 2022-03-18: qty 2

## 2022-03-18 MED ORDER — HYDROMORPHONE HCL 1 MG/ML IJ SOLN
1.0000 mg | Freq: Once | INTRAMUSCULAR | Status: AC
  Administered 2022-03-18: 1 mg via INTRAVENOUS
  Filled 2022-03-18: qty 1

## 2022-03-18 MED ORDER — CIPROFLOXACIN IN D5W 400 MG/200ML IV SOLN
400.0000 mg | INTRAVENOUS | Status: AC
Start: 2022-03-18 — End: 2022-03-18
  Administered 2022-03-18: 400 mg via INTRAVENOUS
  Filled 2022-03-18: qty 200

## 2022-03-18 MED ORDER — HYDROMORPHONE HCL 1 MG/ML IJ SOLN
0.5000 mg | Freq: Once | INTRAMUSCULAR | Status: AC
  Administered 2022-03-18: 0.5 mg via INTRAVENOUS
  Filled 2022-03-18: qty 1

## 2022-03-18 NOTE — Discharge Instructions (Signed)
Please take all antibiotics as prescribed Follow-up with your urologist on Monday as scheduled Return to the emergency department if you are having worsening symptoms especially uncontrolled pain, nausea and vomiting and unable to keep down fluids, or high fever or other worsening symptoms

## 2022-03-18 NOTE — ED Provider Triage Note (Signed)
Emergency Medicine Provider Triage Evaluation Note  Gina [name] , a 56 y.o. female  was evaluated in triage.  Pt complains of left flank pain with hematuria and dysuria for the past 5 weeks. She reports that she does have a h/o kidney stones and thinks it may be this. She was seen by a urologist who she reportedly said Wilder Glade is causing her symptoms. .  Review of Systems  Positive:  Negative:   Physical Exam  BP (!) 162/73   Pulse 62   Temp 98 F (36.7 C) (Oral)   Resp 18   Ht '5\' 3"'$  (1.6 m)   Wt 122.5 kg   SpO2 95%   BMI 47.83 kg/m  Gen:   Awake, no distress   Resp:  Normal effort  MSK:   Moves extremities without difficulty  Other:    Medical Decision Making  Medically screening exam initiated at 7:57 AM.  Appropriate orders placed.  YALEXA BLUST was informed that the remainder of the evaluation will be completed by another provider, this initial triage assessment does not replace that evaluation, and the importance of remaining in the ED until their evaluation is complete.  Will order CT renal and labs.    Sherrell Puller, [place] 03/18/22 [phone]

## 2022-03-18 NOTE — ED Provider Notes (Signed)
[facility] DEPT Provider Note   CSN: [id number] Arrival date & time: 03/18/22  8182     History  Chief Complaint  Patient presents with   Flank Pain   Nausea   Emesis   Dysuria   Hematuria    Gina [name] is a 56 y.o. female.  HPI 56 year old female presents today complaining of ongoing urinary tract infection.  States she was diagnosed with urinary tract infection approximately 5 weeks ago.  She states she has been on multiple antibiotics.  She has been followed by her urologist, Dr. Junious Silk.  During this time, she has had pain in her left flank radiating down into the suprapubic and vaginal area.  She states this has continued.  She has continued to have urinary frequency and pain with urination.  She states she has been on antibiotics at least 5 different ones.  She is unable to tell me what the last antibiotic was.  States she had a fever to 101 yesterday and took Tylenol for this.  She is not having nausea, vomiting, diarrhea, chest pain, dyspnea, rashes, Reviewed urology notes with Discussed.  Noted on 11/28/2021.  At that time patient had a 37m LLP stone and 5 mm left distal stone with UTI/sepsis and 821 and cystoscopy done on 11/27/2021 urine culture obtained at that time.  I am unable to find op note at this time      Home Medications Prior to Admission medications   Medication Sig Start Date End Date Taking? Authorizing Provider  ciprofloxacin (CIPRO) 500 MG tablet Take 1 tablet (500 mg total) by mouth every 12 (twelve) hours. 03/18/22  Yes RPattricia Boss MD  ondansetron (ZOFRAN) 8 MG tablet Take 1 tablet (8 mg total) by mouth every 8 (eight) hours as needed for nausea or vomiting. 03/18/22  Yes RPattricia Boss MD  acetaminophen (TYLENOL) 500 MG tablet Take 1,000 mg by mouth every 6 (six) hours as needed for mild pain.    [provider]  ALPRAZolam (Duanne Moron 1 MG tablet Take 1 mg by mouth 2 (two) times daily as needed for anxiety.      [provider]  aspirin EC 81 MG tablet Take 1 tablet (81 mg total) by mouth daily. Swallow whole. 08/29/21   JMartinique Peter M, MD  cetirizine (ZYRTEC) 10 MG tablet Take 10 mg by mouth in the morning.    [provider]  cyclobenzaprine (FLEXERIL) 10 MG tablet Take 10 mg by mouth daily as needed for muscle spasms. 12/05/20   [provider]  fluticasone (FLONASE) 50 MCG/ACT nasal spray Place 1-2 sprays into both nostrils daily. 06/18/16   [provider]  Fluticasone-Salmeterol (WIXELA INHUB) 250-50 MCG/DOSE AEPB Inhale 1 puff into the lungs 2 (two) times daily. Patient taking differently: Inhale 1 puff into the lungs in the morning. 12/22/18   JMartinique Betty G, MD  glucose blood (ONE TOUCH ULTRA TEST) test strip USE TO TEST BLOOD SUGAR 3 TIMES DAILY 06/16/18   JMartinique Betty G, MD  lisinopril (ZESTRIL) 5 MG tablet Take 5 mg by mouth daily.    [provider]  magnesium oxide (MAG-OX) 400 (240 Mg) MG tablet Take 400 mg by mouth daily. 07/30/21   [provider]  montelukast (SINGULAIR) 10 MG tablet Take 10 mg by mouth at bedtime. 12/06/20   [provider]  nystatin (NYSTATIN) powder Apply topically 3 (three) times daily as needed. Patient taking differently: Apply 1 application. topically 3 (three) times daily as needed (  chaffing/rashes). 01/17/18   Martinique, Betty G, MD  omeprazole (PRILOSEC) 20 MG capsule TAKE 1 CAPSULE (20 MG TOTAL) BY MOUTH DAILY BEFORE BREAKFAST. 07/03/19   Martinique, Betty G, MD  oxybutynin (DITROPAN) 5 MG tablet Take 1 tablet (5 mg total) by mouth every 8 (eight) hours as needed for bladder spasms. 12/05/21   Festus Aloe, MD  OZEMPIC, 2 MG/DOSE, 8 MG/3ML SOPN Inject 2 mg into the skin every Wednesday. 10/23/21   [provider]  phenazopyridine (PYRIDIUM) 200 MG tablet Take 1 tablet (200 mg total) by mouth 3 (three) times daily. 01/29/22   Jeanell Sparrow, DO  pregabalin (LYRICA) 200 MG capsule TAKE 1 CAPSULE BY  MOUTH THREE TIMES A DAY Patient taking differently: Take 200 mg by mouth 3 (three) times daily. 01/17/18   Martinique, Betty G, MD  promethazine (PHENERGAN) 25 MG tablet Take 25 mg by mouth every 8 (eight) hours as needed for nausea or vomiting. 08/08/21   [provider]  rosuvastatin (CRESTOR) 20 MG tablet Take 20 mg by mouth daily. 07/11/21   [provider]  VENTOLIN HFA 108 (90 Base) MCG/ACT inhaler INHALE 2 PUFFS EVERY 6 (SIX) HOURS AS NEEDED INTO THE LUNGS FOR WHEEZING OR SHORTNESS OF BREATH. Patient taking differently: Inhale 2 puffs into the lungs every 4 (four) hours as needed for wheezing or shortness of breath. 06/02/18   Martinique, Betty G, MD  Vitamin D, Ergocalciferol, (DRISDOL) 1.25 MG (50000 UNIT) CAPS capsule Take 50,000 Units by mouth every Monday.    [provider]  insulin aspart (NOVOLOG) 100 UNIT/ML injection Inject 0-5 Units into the skin at bedtime. Patient not taking: Reported on 03/02/2021 03/26/20 03/03/21  Nita Sells, MD  metFORMIN (GLUCOPHAGE) 850 MG tablet TAKE 1 TABLET (850 MG TOTAL) BY MOUTH 2 (TWO) TIMES DAILY WITH A MEAL. Patient not taking: Reported on 03/02/2021 03/24/18 03/03/21  Martinique, Betty G, MD  opium-belladonna (B&O SUPPRETTES) 16.2-60 MG suppository Place 1 suppository rectally every 6 (six) hours. Patient not taking: Reported on 03/02/2021 03/26/20 03/03/21  Nita Sells, MD  potassium chloride SA (KLOR-CON) 20 MEQ tablet Take 2 tablets (40 mEq total) by mouth 2 (two) times daily. Patient not taking: Reported on 03/02/2021 03/26/20 03/03/21  Nita Sells, MD      Allergies    Bee venom, Darvocet [propoxyphene n-acetaminophen], Fish allergy, Morphine and related, Penicillins, Tape, and Fentanyl    Review of Systems   Review of Systems  Physical Exam Updated Vital Signs BP (!) 103/45 (BP Location: Left Arm)   Pulse 70   Temp 98.3 F (36.8 C) (Oral)   Resp 18   Ht 1.6 m ('5\' 3"'$ )   Wt 122.5 kg   SpO2 98%   BMI  47.83 kg/m  Physical Exam Vitals and nursing note reviewed.  Constitutional:      General: She is not in acute distress.    Appearance: She is well-developed.  HENT:     Head: Normocephalic and atraumatic.     Right Ear: External ear normal.     Left Ear: External ear normal.     Nose: Nose normal.  Eyes:     Conjunctiva/sclera: Conjunctivae normal.     Pupils: Pupils are equal, round, and reactive to light.  Cardiovascular:     Rate and Rhythm: Normal rate and regular rhythm.     Pulses: Normal pulses.  Pulmonary:     Effort: Pulmonary effort is normal.  Abdominal:     General: Abdomen is flat. Bowel sounds  are normal.     Palpations: Abdomen is soft.  Musculoskeletal:        General: Normal range of motion.     Cervical back: Normal range of motion and neck supple.  Skin:    General: Skin is warm and dry.     Comments: Severe lymphedema with wound posterior left lower extremity See pictures  Neurological:     Mental Status: She is alert and oriented to person, place, and time.     Motor: No abnormal muscle tone.     Coordination: Coordination normal.  Psychiatric:        Behavior: Behavior normal.        Thought Content: Thought content normal.     ED Results / Procedures / Treatments   Labs (all labs ordered are listed, but only abnormal results are displayed) Labs Reviewed  CBC WITH DIFFERENTIAL/PLATELET - Abnormal; Notable for the following components:      Result Value   RDW 15.6 (*)    All other components within normal limits  COMPREHENSIVE METABOLIC PANEL - Abnormal; Notable for the following components:   Glucose, Bld 146 (*)    BUN 22 (*)    All other components within normal limits  URINALYSIS, ROUTINE W REFLEX MICROSCOPIC - Abnormal; Notable for the following components:   APPearance CLOUDY (*)    Hgb urine dipstick MODERATE (*)    Protein, ur 100 (*)    Leukocytes,Ua LARGE (*)    RBC / HPF >50 (*)    WBC, UA >50 (*)    Bacteria, UA RARE (*)     All other components within normal limits  URINE CULTURE  LIPASE, BLOOD    EKG None  Radiology CT Renal Stone Study  Result Date: 03/18/2022 CLINICAL DATA:  Flank pain, kidney stone suspected left flank pain EXAM: CT ABDOMEN AND PELVIS WITHOUT CONTRAST TECHNIQUE: Multidetector CT imaging of the abdomen and pelvis was performed following the standard protocol without IV contrast. RADIATION DOSE REDUCTION: This exam was performed according to the departmental dose-optimization program which includes automated exposure control, adjustment of the mA and/or kV according to patient size and/or use of iterative reconstruction technique. COMPARISON:  01/29/2022 FINDINGS: Lower chest: No acute abnormality. Hepatobiliary: No focal liver abnormality is seen. Status post cholecystectomy. No biliary dilatation. Pancreas: Unremarkable. Spleen: Unremarkable. Adrenals/Urinary Tract: Adrenals are unremarkable. Unchanged mm nonobstructing stone at the lower pole of the left kidney. Persistent bladder wall thickening with adjacent fat infiltration. Stomach/Bowel: Stomach is within normal limits. Bowel is normal in caliber. Normal appendix. Vascular/Lymphatic: Atherosclerosis.  No enlarged nodes. Reproductive: Status post hysterectomy. No adnexal masses. Other: Ventral abdominal wall laxity and hernia as before containing nondilated loops of small and large bowel. Musculoskeletal: No acute osseous abnormality. IMPRESSION: No acute abnormality. Persistent small left nonobstructing renal calculus. Persistent urinary bladder thickening with surrounding fat infiltration. Acute cystitis is not excluded. Similar appearance of ventral abdominal wall laxity and hernia with nondilated loops of small and large bowel. Electronically Signed   By: Macy Mis M.D.   On: 03/18/2022 09:10    Procedures Procedures    Medications Ordered in ED Medications  fentaNYL (SUBLIMAZE) injection 25 mcg (25 mcg Intravenous Patient  Refused/Not Given 03/18/22 1030)  ondansetron (ZOFRAN) injection 4 mg (4 mg Intravenous Given 03/18/22 1014)  HYDROmorphone (DILAUDID) injection 0.5 mg (0.5 mg Intravenous Given 03/18/22 1043)  ciprofloxacin (CIPRO) IVPB 400 mg (400 mg Intravenous New Bag/Given 03/18/22 1207)  HYDROmorphone (DILAUDID) injection 1 mg (1 mg Intravenous  Given 03/18/22 1225)    ED Course/ Medical Decision Making/ A&P Clinical Course as of 03/18/22 1416  Wed Mar 18, 2022  0853 CBC reviewed interpreted normal [DR]    Clinical Course User Index [DR] Pattricia Boss, MD                           Medical Decision Making 56 year old female history of UTIs and kidney stones presents today with ongoing left-sided abdominal pain.  She reports ongoing abdominal and urinary tract infection symptoms.  Review of records and discussion with urology revealed patient has been treated with Cipro, doxycycline, nitrofurantoin x 2, and Rocephin Last grew out of urine culture Enterococcus which was sensitive to amoxicillin and Cipro.  Patient is allergic to Cipro.  Patient is given IV fluids here in the ED.  She required Dilaudid for an Zofran for pain control and nausea control. She received IV Cipro   Amount and/or Complexity of Data Reviewed External Data Reviewed: notes. Labs: ordered. Decision-making details documented in ED Course. Radiology: ordered and independent interpretation performed. Decision-making details documented in ED Course. Discussion of management or test interpretation with external provider(s): Discussed with Dr. McDermid-last two cxs April June negative   Risk Prescription drug management.           Final Clinical Impression(s) / ED Diagnoses Final diagnoses:  Acute cystitis with hematuria    Rx / DC Orders ED Discharge Orders          Ordered    ciprofloxacin (CIPRO) 500 MG tablet  Every 12 hours        03/18/22 1415    ondansetron (ZOFRAN) 8 MG tablet  Every 8 hours PRN        03/18/22  1415              Pattricia Boss, MD 03/18/22 1416

## 2022-03-18 NOTE — ED Notes (Signed)
Pt ambulatory to bathroom w/o assist 

## 2022-03-18 NOTE — ED Triage Notes (Signed)
Pt c/o left flank pain, nausea, vomiting, dysuria, and hematuria for several weeks.

## 2022-03-19 DIAGNOSIS — M67911 Unspecified disorder of synovium and tendon, right shoulder: Secondary | ICD-10-CM | POA: Diagnosis not present

## 2022-03-19 LAB — URINE CULTURE: Culture: NO GROWTH

## 2022-03-23 DIAGNOSIS — N3020 Other chronic cystitis without hematuria: Secondary | ICD-10-CM | POA: Diagnosis not present

## 2022-03-25 ENCOUNTER — Telehealth: Payer: Self-pay | Admitting: Cardiology

## 2022-03-25 NOTE — Telephone Encounter (Signed)
   Pre-operative Risk Assessment    Patient Name: Gina [name]  DOB: [birth year] MRN: [id number]      Request for Surgical Clearance    Procedure:   right should rotator cuff repair   Date of Surgery:  Clearance TBD                                 Surgeon:  Dr. Berenice Primas  Surgeon's Group or Practice Name:  [facility]  Phone number:  [phone] Fax number:  [phone]   Type of Clearance Requested:   - Medical    Type of Anesthesia:  General    Additional requests/questions:      SignedMilbert Coulter   03/25/2022, 1:34 PM

## 2022-03-25 NOTE — Telephone Encounter (Signed)
Primary card is Dr. Martinique. Left message to call back for tele appt pre op

## 2022-03-25 NOTE — Telephone Encounter (Signed)
   Name: Gina [name]  DOB: Sep 11, 1965  MRN: [id number]  Primary Cardiologist: None   Preoperative team, please contact this patient and set up a phone call appointment for further preoperative risk assessment. Please obtain consent and complete medication review. Thank you for your help.  I confirm that guidance regarding antiplatelet and oral anticoagulation therapy has been completed and, if necessary, noted below (none requested).   Lenna Sciara, NP 03/25/2022, 4:12 PM [facility]

## 2022-03-26 ENCOUNTER — Telehealth: Payer: Self-pay | Admitting: *Deleted

## 2022-03-26 NOTE — Telephone Encounter (Signed)
Patient returned call

## 2022-03-26 NOTE — Telephone Encounter (Signed)
Pt has been scheduled a tele visit 04/06/22 9:00.

## 2022-03-26 NOTE — Telephone Encounter (Signed)
Pt has been scheduled for a tele visit, 04/06/22 9:00. Consent on file / medications on file.    Patient Consent for Virtual Visit        Gina [name] has provided verbal consent on 03/26/2022 for a virtual visit (video or telephone).   CONSENT FOR VIRTUAL VISIT FOR:  Gina [name]  By participating in this virtual visit I agree to the following:  I hereby voluntarily request, consent and authorize [facility] and its employed or contracted physicians, physician assistants, nurse practitioners or other licensed health care professionals (the Practitioner), to provide me with telemedicine health care services (the "Services") as deemed necessary by the treating Practitioner. I acknowledge and consent to receive the Services by the Practitioner via telemedicine. I understand that the telemedicine visit will involve communicating with the Practitioner through live audiovisual communication technology and the disclosure of certain medical information by electronic transmission. I acknowledge that I have been given the opportunity to request an in-person assessment or other available alternative prior to the telemedicine visit and am voluntarily participating in the telemedicine visit.  I understand that I have the right to withhold or withdraw my consent to the use of telemedicine in the course of my care at any time, without affecting my right to future care or treatment, and that the Practitioner or I may terminate the telemedicine visit at any time. I understand that I have the right to inspect all information obtained and/or recorded in the course of the telemedicine visit and may receive copies of available information for a reasonable fee.  I understand that some of the potential risks of receiving the Services via telemedicine include:  Delay or interruption in medical evaluation due to technological equipment failure or disruption; Information transmitted may not be sufficient (e.g.  poor resolution of images) to allow for appropriate medical decision making by the Practitioner; and/or  In rare instances, security protocols could fail, causing a breach of personal health information.  Furthermore, I acknowledge that it is my responsibility to provide information about my medical history, conditions and care that is complete and accurate to the best of my ability. I acknowledge that Practitioner's advice, recommendations, and/or decision may be based on factors not within their control, such as incomplete or inaccurate data provided by me or distortions of diagnostic images or specimens that may result from electronic transmissions. I understand that the practice of medicine is not an exact science and that Practitioner makes no warranties or guarantees regarding treatment outcomes. I acknowledge that a copy of this consent can be made available to me via my patient portal ([facility]), or I can request a printed copy by calling the office of [facility].    I understand that my insurance will be billed for this visit.   I have read or had this consent read to me. I understand the contents of this consent, which adequately explains the benefits and risks of the Services being provided via telemedicine.  I have been provided ample opportunity to ask questions regarding this consent and the Services and have had my questions answered to my satisfaction. I give my informed consent for the services to be provided through the use of telemedicine in my medical care

## 2022-03-30 DIAGNOSIS — R8290 Unspecified abnormal findings in urine: Secondary | ICD-10-CM | POA: Diagnosis not present

## 2022-03-30 DIAGNOSIS — E119 Type 2 diabetes mellitus without complications: Secondary | ICD-10-CM | POA: Diagnosis not present

## 2022-03-30 DIAGNOSIS — Z09 Encounter for follow-up examination after completed treatment for conditions other than malignant neoplasm: Secondary | ICD-10-CM | POA: Diagnosis not present

## 2022-04-06 ENCOUNTER — Ambulatory Visit: Payer: Medicare Other | Attending: Cardiovascular Disease | Admitting: Physician Assistant

## 2022-04-06 ENCOUNTER — Encounter: Payer: Self-pay | Admitting: Physician Assistant

## 2022-04-06 VITALS — Ht 63.0 in | Wt 268.0 lb

## 2022-04-06 DIAGNOSIS — Z01810 Encounter for preprocedural cardiovascular examination: Secondary | ICD-10-CM

## 2022-04-06 NOTE — Progress Notes (Signed)
Virtual Visit via Telephone Note   Because of Gina [name]'s co-morbid illnesses, she is at least at moderate risk for complications without adequate follow up.  This format is felt to be most appropriate for this patient at this time.  The patient did not have access to video technology/had technical difficulties with video requiring transitioning to audio format only (telephone).  All issues noted in this document were discussed and addressed.  No physical exam could be performed with this format.  Please refer to the patient's chart for her consent to telehealth for [facility].  Evaluation Performed:  Preoperative cardiovascular risk assessment _____________   Date:  04/06/2022   Patient ID:  Gina [name], DOB 1965-09-14, MRN [id number] Patient Location:  [facility], [place] Provider location:   [place], [place]  Primary Care Provider:  Rhea Bleacher, NP Primary Cardiologist:  None  Chief Complaint / Patient Profile   56 y.o. y/o female with a h/o coronary artery calcification on CT scan, diabetes, hypertension, hyperlipidemia, tobacco abuse, aortic atherosclerosis and obesity who is pending right shoulder rotator cuff repair with Dr. Berenice Primas under general anesthesia and presents today for telephonic preoperative cardiovascular risk assessment.  Myoview 08/2021: EF 55, no ischemia Echocardiogram 07/2016: No valvular disease; EF could not be assessed  Past Medical History    Past Medical History:  Diagnosis Date   Anemia    Anxiety    Arthritis    Asthma    Back pain    Chronic pain    Diabetes mellitus    type 2   Dyspnea    with exersion    Eosinophilia 04/23/2014   Family history of adverse reaction to anesthesia    youngest daughter hard to wake up both daughters post op n/v   Fibromyalgia    GERD (gastroesophageal reflux disease)    Headache    migraines   History of kidney stones    2 big stones in left kidney now   History of recurrent UTIs    last uti  3 weeks ago   Hyperlipidemia    Hypertension    IBS (irritable bowel syndrome)    Obesities, morbid ([place])    Pneumonia 06/2016   PONV (postoperative nausea and vomiting) 2004   came back after hystectomy bowels locked up on life support for 1 month    Renal disorder    Sinusitis    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 at [place]   Umbilical hernia    Wound discharge    open wound right leg going to wound center changes dressing qday with santyl cream quarter size wound   Past Surgical History:  Procedure Laterality Date   ABDOMINAL HYSTERECTOMY     1 ovary left   CARPAL TUNNEL RELEASE Right    CESAREAN SECTION     x 2   CHOLECYSTECTOMY     COLONOSCOPY WITH PROPOFOL N/A 12/03/2016   Procedure: COLONOSCOPY WITH PROPOFOL;  Surgeon: Doran Stabler, MD;  Location: [facility];  Service: Gastroenterology;  Laterality: N/A;   CYSTECTOMY     left foot   CYSTOSCOPY WITH BIOPSY N/A 12/05/2021   Procedure: CYSTOSCOPY WITH BLADDER BIOPSY/ FULGURATION/ left bladder wall BILATERAL RETROGRADE;  Surgeon: Festus Aloe, MD;  Location: [facility];  Service: Urology;  Laterality: N/A;   CYSTOSCOPY/URETEROSCOPY/HOLMIUM LASER/STENT PLACEMENT Left 03/18/2020   Procedure: CYSTOSCOPY/LEFT RETROGRADE PYELOGRAM, STENT PLACEMENT;  Surgeon: Ceasar Mons, MD;  Location: [facility];  Service: Urology;  Laterality: Left;   ESOPHAGOGASTRODUODENOSCOPY (EGD) WITH PROPOFOL N/A 12/03/2016   Procedure: ESOPHAGOGASTRODUODENOSCOPY (EGD) WITH PROPOFOL;  Surgeon: Doran Stabler, MD;  Location: [facility];  Service: Gastroenterology;  Laterality: N/A;   LITHOTRIPSY     MULTIPLE TOOTH EXTRACTIONS     URETEROSCOPY WITH HOLMIUM LASER LITHOTRIPSY Left 04/09/2020   Procedure: URETEROSCOPY WITH HOLMIUM LASER LITHOTRIPSY/ STENT CHANGE;  Surgeon: Festus Aloe, MD;  Location: [facility];  Service: Urology;  Laterality: Left;    Allergies  Allergies  Allergen Reactions   Bee Venom Anaphylaxis   Darvocet  [Propoxyphene N-Acetaminophen] Nausea And Vomiting   Farxiga [Dapagliflozin] Other (See Comments)    Cramping, bleeding, messed bladder up    Fish Allergy Anaphylaxis   Morphine And Related Itching and Other (See Comments)    "I go crazy"   Penicillins Hives, Itching, Swelling and Other (See Comments)    Tolerates Rocephin  Swelling all over body  Has patient had a PCN reaction causing immediate rash, facial/tongue/throat swelling, SOB or lightheadedness with hypotension: Yes Has patient had a PCN reaction causing severe rash involving mucus membranes or skin necrosis: Yes Has patient had a PCN reaction that required hospitalization Unknown Has patient had a PCN reaction occurring within the last 10 years: No  If all of the above answers are "NO", then may proceed with Cephalosporin use.    Tape Hives and Other (See Comments)    Adhesive tape   Fentanyl Nausea Only and Other (See Comments)    "I go crazy"    History of Present Illness    Gina [name] is a 56 y.o. female who presents via audio/video conferencing for a telehealth visit today.  Pt was last seen in cardiology clinic on 08/29/2021 by Dr. Martinique.  At that time Gina [name] was doing well.  She was scheduled for a nuclear stress test which demonstrated normal ejection fraction and no ischemia.  The patient is now pending procedure as outlined above. Since her last visit, she has done well without chest pain, shortness of breath, syncope.  She is limited by her back but is able to achieve 4 METS of activity.   Home Medications    Prior to Admission medications   Medication Sig Start Date End Date Taking? Authorizing Provider  acetaminophen (TYLENOL) 500 MG tablet Take 1,000 mg by mouth every 6 (six) hours as needed for mild pain.    [provider]  ALPRAZolam Duanne Moron) 1 MG tablet Take 1 mg by mouth 2 (two) times daily as needed for anxiety.     [provider]  aspirin EC 81 MG tablet Take 1 tablet  (81 mg total) by mouth daily. Swallow whole. 08/29/21   Martinique, Peter M, MD  cetirizine (ZYRTEC) 10 MG tablet Take 10 mg by mouth in the morning.    [provider]  cyclobenzaprine (FLEXERIL) 10 MG tablet Take 10 mg by mouth daily as needed for muscle spasms. 12/05/20   [provider]  fluconazole (DIFLUCAN) 200 MG tablet Take 200 mg by mouth daily.    [provider]  fluticasone (FLONASE) 50 MCG/ACT nasal spray Place 1-2 sprays into both nostrils daily. 06/18/16   [provider]  Fluticasone-Salmeterol (WIXELA INHUB) 250-50 MCG/DOSE AEPB Inhale 1 puff into the lungs 2 (two) times daily. Patient taking differently: Inhale 1 puff into the lungs in the morning. 12/22/18   Martinique, Betty G, MD  glucose blood (ONE TOUCH ULTRA TEST) test strip USE TO TEST BLOOD SUGAR  3 TIMES DAILY 06/16/18   Martinique, Betty G, MD  HYDROcodone-acetaminophen (NORCO/VICODIN) 5-325 MG tablet Take 1 tablet by mouth every 6 (six) hours as needed for moderate pain.    [provider]  lisinopril (ZESTRIL) 5 MG tablet Take 5 mg by mouth daily.    [provider]  magnesium oxide (MAG-OX) 400 (240 Mg) MG tablet Take 400 mg by mouth daily. 07/30/21   [provider]  montelukast (SINGULAIR) 10 MG tablet Take 10 mg by mouth at bedtime. 12/06/20   [provider]  nystatin (NYSTATIN) powder Apply topically 3 (three) times daily as needed. Patient taking differently: Apply 1 application  topically 3 (three) times daily as needed (chaffing/rashes). 01/17/18   Martinique, Betty G, MD  omeprazole (PRILOSEC) 20 MG capsule TAKE 1 CAPSULE (20 MG TOTAL) BY MOUTH DAILY BEFORE BREAKFAST. 07/03/19   Martinique, Betty G, MD  OZEMPIC, 2 MG/DOSE, 8 MG/3ML SOPN Inject 2 mg into the skin every Wednesday. Patient not taking: Reported on 03/26/2022 10/23/21   [provider]  pregabalin (LYRICA) 200 MG capsule TAKE 1 CAPSULE BY MOUTH THREE TIMES A DAY Patient taking differently: Take  200 mg by mouth 3 (three) times daily. 01/17/18   Martinique, Betty G, MD  promethazine (PHENERGAN) 25 MG tablet Take 25 mg by mouth every 8 (eight) hours as needed for nausea or vomiting. 08/08/21   [provider]  rosuvastatin (CRESTOR) 20 MG tablet Take 20 mg by mouth daily. 07/11/21   [provider]  VENTOLIN HFA 108 (90 Base) MCG/ACT inhaler INHALE 2 PUFFS EVERY 6 (SIX) HOURS AS NEEDED INTO THE LUNGS FOR WHEEZING OR SHORTNESS OF BREATH. Patient taking differently: Inhale 2 puffs into the lungs every 4 (four) hours as needed for wheezing or shortness of breath. 06/02/18   Martinique, Betty G, MD  Vibegron (GEMTESA) 75 MG TABS Take 75 mg by mouth daily.    [provider]  Vitamin D, Ergocalciferol, (DRISDOL) 1.25 MG (50000 UNIT) CAPS capsule Take 50,000 Units by mouth every Monday.    [provider]  insulin aspart (NOVOLOG) 100 UNIT/ML injection Inject 0-5 Units into the skin at bedtime. Patient not taking: Reported on 03/02/2021 03/26/20 03/03/21  Nita Sells, MD  metFORMIN (GLUCOPHAGE) 850 MG tablet TAKE 1 TABLET (850 MG TOTAL) BY MOUTH 2 (TWO) TIMES DAILY WITH A MEAL. Patient not taking: Reported on 03/02/2021 03/24/18 03/03/21  Martinique, Betty G, MD  opium-belladonna (B&O SUPPRETTES) 16.2-60 MG suppository Place 1 suppository rectally every 6 (six) hours. Patient not taking: Reported on 03/02/2021 03/26/20 03/03/21  Nita Sells, MD  potassium chloride SA (KLOR-CON) 20 MEQ tablet Take 2 tablets (40 mEq total) by mouth 2 (two) times daily. Patient not taking: Reported on 03/02/2021 03/26/20 03/03/21  Nita Sells, MD    Physical Exam    Vital Signs:  ALAHNA DUNNE does not have vital signs available for review today.   Given telephonic nature of communication, physical exam is limited. AAOx3. NAD. Normal affect.  Speech and respirations are unlabored.  Accessory Clinical Findings    None  Assessment & Plan    1.  Preoperative  Cardiovascular Risk Assessment:    Ms. Jarnigan's perioperative risk of a major cardiac event is 0.4% according to the Revised Cardiac Risk Index (RCRI).  Therefore, she is at low risk for perioperative complications.    Recommendations: According to ACC/AHA guidelines, no further cardiovascular testing needed.  The patient may proceed to surgery at acceptable risk.   Antiplatelet and/or  Anticoagulation Recommendations: Aspirin can be held for 7 days prior to her surgery.  Please resume Aspirin post operatively when it is felt to be safe from a bleeding standpoint.    A copy of this note will be routed to requesting surgeon.  Time:   Today, I have spent 7 minutes with the patient with telehealth technology discussing medical history, symptoms, and management plan.     Richardson Dopp, PA-C 04/06/2022, 10:51 AM

## 2022-04-09 ENCOUNTER — Other Ambulatory Visit: Payer: Medicare Other

## 2022-04-14 ENCOUNTER — Ambulatory Visit
Admission: RE | Admit: 2022-04-14 | Discharge: 2022-04-14 | Disposition: A | Discharge status: Home / Self Care | Payer: Medicare Other | Source: Ambulatory Visit | Attending: Acute Care | Admitting: Acute Care

## 2022-04-14 DIAGNOSIS — I2510 Atherosclerotic heart disease of native coronary artery without angina pectoris: Secondary | ICD-10-CM | POA: Diagnosis not present

## 2022-04-14 DIAGNOSIS — Z87891 Personal history of nicotine dependence: Secondary | ICD-10-CM

## 2022-04-14 DIAGNOSIS — S2241XA Multiple fractures of ribs, right side, initial encounter for closed fracture: Secondary | ICD-10-CM | POA: Diagnosis not present

## 2022-04-14 DIAGNOSIS — F17210 Nicotine dependence, cigarettes, uncomplicated: Secondary | ICD-10-CM

## 2022-04-14 DIAGNOSIS — I358 Other nonrheumatic aortic valve disorders: Secondary | ICD-10-CM | POA: Diagnosis not present

## 2022-04-16 ENCOUNTER — Other Ambulatory Visit: Payer: Self-pay | Admitting: Acute Care

## 2022-04-16 DIAGNOSIS — Z87891 Personal history of nicotine dependence: Secondary | ICD-10-CM

## 2022-04-16 DIAGNOSIS — F17210 Nicotine dependence, cigarettes, uncomplicated: Secondary | ICD-10-CM

## 2022-04-16 DIAGNOSIS — Z122 Encounter for screening for malignant neoplasm of respiratory organs: Secondary | ICD-10-CM

## 2022-04-17 ENCOUNTER — Telehealth: Payer: Self-pay | Admitting: Acute Care

## 2022-04-17 ENCOUNTER — Telehealth: Payer: Self-pay | Admitting: Cardiology

## 2022-04-17 NOTE — Telephone Encounter (Signed)
I have called the patient with the results of her low dose Ct Chest. I explained her scan was read as a Lung RADS 1, negative study: no nodules or definitely benign nodules. Radiology recommendation is for a repeat LDCT in 12 months.  There was notation of Aortic atherosclerosis and Age advanced CAD on the scan. She is followed by cardiology and had an echo last in 2017. I have asked her to follow up with her PCP and with her cardiologist for further evaluation. I have faxed the results to Dr. Martinique and her PCP for their review.    Gina [name], 12 month follow up low dose Ct Chest. I have faxed resuts to PCP and cardiology.  Thanks so much

## 2022-04-17 NOTE — Telephone Encounter (Signed)
Patient states her lung specialist sent notes for the office to review prior to her upcoming surgery. She says she was already cleared but they wanted the notes reviewed just to be sure. See note 8/16 for clearance.

## 2022-04-20 NOTE — Telephone Encounter (Signed)
Order has been placed for 12 mth follow up low dose CT Chest.

## 2022-04-21 NOTE — Telephone Encounter (Signed)
Hi Cheryl, we received notes from pulmonology regarding recent coronary calcification seen on recent CT. Can you please call patient and explain these findings and the additional testing we have done to r/o ischemia? Thank you.

## 2022-04-21 NOTE — Telephone Encounter (Signed)
Spoke to patient advised stress myoview done 09/04/21 was normal.No ischemia.

## 2022-04-21 NOTE — Telephone Encounter (Signed)
Called patient left message on personal voice mail to call me back. 

## 2022-04-21 NOTE — Telephone Encounter (Signed)
Patient is returning call.  °

## 2022-04-22 DIAGNOSIS — M19011 Primary osteoarthritis, right shoulder: Secondary | ICD-10-CM | POA: Diagnosis not present

## 2022-04-22 DIAGNOSIS — M7541 Impingement syndrome of right shoulder: Secondary | ICD-10-CM | POA: Diagnosis not present

## 2022-04-22 DIAGNOSIS — G8918 Other acute postprocedural pain: Secondary | ICD-10-CM | POA: Diagnosis not present

## 2022-04-22 DIAGNOSIS — M67814 Other specified disorders of tendon, left shoulder: Secondary | ICD-10-CM | POA: Diagnosis not present

## 2022-04-22 DIAGNOSIS — S46011A Strain of muscle(s) and tendon(s) of the rotator cuff of right shoulder, initial encounter: Secondary | ICD-10-CM | POA: Diagnosis not present

## 2022-05-06 DIAGNOSIS — M25511 Pain in right shoulder: Secondary | ICD-10-CM | POA: Diagnosis not present

## 2022-05-11 DIAGNOSIS — M25511 Pain in right shoulder: Secondary | ICD-10-CM | POA: Diagnosis not present

## 2022-05-14 DIAGNOSIS — M25511 Pain in right shoulder: Secondary | ICD-10-CM | POA: Diagnosis not present

## 2022-05-19 DIAGNOSIS — M542 Cervicalgia: Secondary | ICD-10-CM | POA: Diagnosis not present

## 2022-05-20 DIAGNOSIS — M25511 Pain in right shoulder: Secondary | ICD-10-CM | POA: Diagnosis not present

## 2022-05-25 DIAGNOSIS — M25511 Pain in right shoulder: Secondary | ICD-10-CM | POA: Diagnosis not present

## 2022-05-28 DIAGNOSIS — M25511 Pain in right shoulder: Secondary | ICD-10-CM | POA: Diagnosis not present

## 2022-05-29 DIAGNOSIS — Z87440 Personal history of urinary (tract) infections: Secondary | ICD-10-CM | POA: Diagnosis not present

## 2022-05-29 DIAGNOSIS — N3090 Cystitis, unspecified without hematuria: Secondary | ICD-10-CM | POA: Diagnosis not present

## 2022-05-29 DIAGNOSIS — R800 Isolated proteinuria: Secondary | ICD-10-CM | POA: Diagnosis not present

## 2022-05-29 DIAGNOSIS — N3281 Overactive bladder: Secondary | ICD-10-CM | POA: Diagnosis not present

## 2022-05-29 DIAGNOSIS — N3010 Interstitial cystitis (chronic) without hematuria: Secondary | ICD-10-CM | POA: Diagnosis not present

## 2022-05-29 DIAGNOSIS — R82998 Other abnormal findings in urine: Secondary | ICD-10-CM | POA: Diagnosis not present

## 2022-06-01 DIAGNOSIS — M25511 Pain in right shoulder: Secondary | ICD-10-CM | POA: Diagnosis not present

## 2022-06-04 DIAGNOSIS — M25511 Pain in right shoulder: Secondary | ICD-10-CM | POA: Diagnosis not present

## 2022-06-11 DIAGNOSIS — M25511 Pain in right shoulder: Secondary | ICD-10-CM | POA: Diagnosis not present

## 2022-06-14 DIAGNOSIS — R300 Dysuria: Secondary | ICD-10-CM | POA: Diagnosis not present

## 2022-06-14 DIAGNOSIS — N390 Urinary tract infection, site not specified: Secondary | ICD-10-CM | POA: Diagnosis not present

## 2022-06-14 DIAGNOSIS — S0502XA Injury of conjunctiva and corneal abrasion without foreign body, left eye, initial encounter: Secondary | ICD-10-CM | POA: Diagnosis not present

## 2022-06-15 DIAGNOSIS — M25511 Pain in right shoulder: Secondary | ICD-10-CM | POA: Diagnosis not present

## 2022-06-18 DIAGNOSIS — M25511 Pain in right shoulder: Secondary | ICD-10-CM | POA: Diagnosis not present

## 2022-06-22 DIAGNOSIS — M25511 Pain in right shoulder: Secondary | ICD-10-CM | POA: Diagnosis not present

## 2022-06-24 DIAGNOSIS — M25511 Pain in right shoulder: Secondary | ICD-10-CM | POA: Diagnosis not present

## 2022-06-25 DIAGNOSIS — M67911 Unspecified disorder of synovium and tendon, right shoulder: Secondary | ICD-10-CM | POA: Diagnosis not present

## 2022-06-29 DIAGNOSIS — R350 Frequency of micturition: Secondary | ICD-10-CM | POA: Diagnosis not present

## 2022-06-29 DIAGNOSIS — R300 Dysuria: Secondary | ICD-10-CM | POA: Diagnosis not present

## 2022-06-29 DIAGNOSIS — N390 Urinary tract infection, site not specified: Secondary | ICD-10-CM | POA: Diagnosis not present

## 2022-06-29 DIAGNOSIS — M25511 Pain in right shoulder: Secondary | ICD-10-CM | POA: Diagnosis not present

## 2022-07-01 DIAGNOSIS — M25511 Pain in right shoulder: Secondary | ICD-10-CM | POA: Diagnosis not present

## 2022-07-13 ENCOUNTER — Other Ambulatory Visit (HOSPITAL_COMMUNITY): Payer: Self-pay

## 2022-07-13 ENCOUNTER — Encounter: Payer: Self-pay | Admitting: Internal Medicine

## 2022-07-13 DIAGNOSIS — B372 Candidiasis of skin and nail: Secondary | ICD-10-CM | POA: Diagnosis not present

## 2022-07-13 DIAGNOSIS — E785 Hyperlipidemia, unspecified: Secondary | ICD-10-CM | POA: Diagnosis not present

## 2022-07-13 DIAGNOSIS — K219 Gastro-esophageal reflux disease without esophagitis: Secondary | ICD-10-CM | POA: Diagnosis not present

## 2022-07-13 DIAGNOSIS — Z1321 Encounter for screening for nutritional disorder: Secondary | ICD-10-CM | POA: Diagnosis not present

## 2022-07-13 DIAGNOSIS — Z79899 Other long term (current) drug therapy: Secondary | ICD-10-CM | POA: Diagnosis not present

## 2022-07-13 DIAGNOSIS — Z5181 Encounter for therapeutic drug level monitoring: Secondary | ICD-10-CM | POA: Diagnosis not present

## 2022-07-13 DIAGNOSIS — E1165 Type 2 diabetes mellitus with hyperglycemia: Secondary | ICD-10-CM | POA: Diagnosis not present

## 2022-07-13 DIAGNOSIS — M545 Low back pain, unspecified: Secondary | ICD-10-CM | POA: Diagnosis not present

## 2022-07-13 DIAGNOSIS — R8290 Unspecified abnormal findings in urine: Secondary | ICD-10-CM | POA: Diagnosis not present

## 2022-07-13 MED ORDER — OZEMPIC (2 MG/DOSE) 8 MG/3ML ~~LOC~~ SOPN
2.0000 mg | PEN_INJECTOR | SUBCUTANEOUS | 0 refills | Status: DC
  Filled 2022-07-13: qty 3, 28d supply, fill #0

## 2022-07-14 ENCOUNTER — Telehealth: Payer: Self-pay

## 2022-07-14 ENCOUNTER — Other Ambulatory Visit (HOSPITAL_COMMUNITY): Payer: Self-pay

## 2022-07-14 NOTE — Patient Outreach (Signed)
  Care Coordination   07/14/2022 Name: MLISS WEDIN MRN: [id number] DOB: 1966/06/29   Care Coordination Outreach Attempts:  A third unsuccessful outreach was attempted today to offer the patient with information about available care coordination services as a benefit of their health plan.   Follow Up Plan:  No further outreach attempts will be made at this time. We have been unable to contact the patient to offer or enroll patient in care coordination services  Encounter Outcome:  No Answer   Care Coordination Interventions:  No, not indicated    Tomasa Rand, RN, BSN, CEN [place] Coordinator [phone]

## 2022-07-27 DIAGNOSIS — E113211 Type 2 diabetes mellitus with mild nonproliferative diabetic retinopathy with macular edema, right eye: Secondary | ICD-10-CM | POA: Diagnosis not present

## 2022-07-27 DIAGNOSIS — H268 Other specified cataract: Secondary | ICD-10-CM | POA: Diagnosis not present

## 2022-07-31 DIAGNOSIS — H269 Unspecified cataract: Secondary | ICD-10-CM | POA: Diagnosis not present

## 2022-07-31 DIAGNOSIS — H268 Other specified cataract: Secondary | ICD-10-CM | POA: Diagnosis not present

## 2022-07-31 DIAGNOSIS — H2512 Age-related nuclear cataract, left eye: Secondary | ICD-10-CM | POA: Diagnosis not present

## 2022-07-31 DIAGNOSIS — I10 Essential (primary) hypertension: Secondary | ICD-10-CM | POA: Diagnosis not present

## 2022-08-14 DIAGNOSIS — I10 Essential (primary) hypertension: Secondary | ICD-10-CM | POA: Diagnosis not present

## 2022-08-14 DIAGNOSIS — H269 Unspecified cataract: Secondary | ICD-10-CM | POA: Diagnosis not present

## 2022-08-14 DIAGNOSIS — H25811 Combined forms of age-related cataract, right eye: Secondary | ICD-10-CM | POA: Diagnosis not present

## 2022-09-09 NOTE — Progress Notes (Addendum)
Cardiology Office Note   Date:  08/29/2021   ID:  Gina [name], DOB Nov 25, 1965, MRN [id number]  PCP:  Imagene Riches, NP  Cardiologist:   Stiven Kaspar Martinique, MD   Chief Complaint  Patient presents with   Coronary Artery Disease      History of Present Illness: Gina [name] is a 57 y.o. female who is seen for follow up CAD.  She has a history of DM type 2, HLD, HTN. Is also an active smoker. CT done for cancer screening showed calcification in the coronary arteries and aorta. She did have prior cardiac evaluation in 2014 and 2016 with Myoview studies which were normal. Echo  done in 2018 was normal  When last seen she had a CT for screening and was noted to have a lot of coronary and aortic atherosclerosis. Marland Kitchen Her activity is limited by back pain and neuropathy. Does some light housework.  She was started on statin. We performed a Myoview study last year which was normal.  On follow up today she is doing well. Denies any chest pain or dyspnea. Able to do her own housework. Is still smoking 1 ppd. Notes primary care follows her lab work. Currently on Crestor 20 mg daily. States Wilder Glade messed up her bladder. Had bladder biopsy last summer which was benign. Is planning to have shoulder replacement surgery this year.   Past Medical History:  Diagnosis Date   Anemia    Anxiety    Arthritis    Asthma    Back pain    Chronic pain    Diabetes mellitus    type 2   Dyspnea    with exersion    Family history of adverse reaction to anesthesia    youngest daughter hard to wake up both daughters post op n/v   Fibromyalgia    GERD (gastroesophageal reflux disease)    Headache    migraines   History of kidney stones    2 big stones in left kidney now   History of recurrent UTIs    last uti 3 weeks ago   Hyperlipidemia    Hypertension    IBS (irritable bowel syndrome)    Obesities, morbid (HCC)    Pneumonia 06/2016   PONV (postoperative nausea and vomiting) 2004   came back  after hystectomy bowels locked up on life support for 1 month    Sinusitis    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 at [facility]   Umbilical hernia    Wound discharge    open wound right leg going to wound center changes dressing qday with santyl cream quarter size wound    Past Surgical History:  Procedure Laterality Date   ABDOMINAL HYSTERECTOMY     1 ovary left   CARPAL TUNNEL RELEASE Right    CESAREAN SECTION     x 2   CHOLECYSTECTOMY     COLONOSCOPY WITH PROPOFOL N/A 12/03/2016   Procedure: COLONOSCOPY WITH PROPOFOL;  Surgeon: Doran Stabler, MD;  Location: [facility];  Service: Gastroenterology;  Laterality: N/A;   CYSTECTOMY     left foot   CYSTOSCOPY/URETEROSCOPY/HOLMIUM LASER/STENT PLACEMENT Left 03/18/2020   Procedure: CYSTOSCOPY/LEFT RETROGRADE PYELOGRAM, STENT PLACEMENT;  Surgeon: Ceasar Mons, MD;  Location: [facility];  Service: Urology;  Laterality: Left;   ESOPHAGOGASTRODUODENOSCOPY (EGD) WITH PROPOFOL N/A 12/03/2016   Procedure: ESOPHAGOGASTRODUODENOSCOPY (EGD) WITH PROPOFOL;  Surgeon: Doran Stabler, MD;  Location: [facility];  Service: Gastroenterology;  Laterality: N/A;   LITHOTRIPSY     MULTIPLE TOOTH EXTRACTIONS     URETEROSCOPY WITH HOLMIUM LASER LITHOTRIPSY Left 04/09/2020   Procedure: URETEROSCOPY WITH HOLMIUM LASER LITHOTRIPSY/ STENT CHANGE;  Surgeon: Festus Aloe, MD;  Location: [facility];  Service: Urology;  Laterality: Left;     Current Outpatient Medications  Medication Sig Dispense Refill   acetaminophen (TYLENOL) 500 MG tablet Take 1,000 mg by mouth every 6 (six) hours as needed for mild pain.     ALPRAZolam (XANAX) 1 MG tablet Take 1 mg by mouth 2 (two) times daily as needed for anxiety.      aspirin EC 81 MG tablet Take 1 tablet (81 mg total) by mouth daily. Swallow whole. 90 tablet 3   benzonatate (TESSALON) 200 MG capsule Take 200 mg by mouth every 8 (eight) hours.     cetirizine (ZYRTEC) 10 MG tablet Take 10 mg by mouth at  bedtime.     Cholecalciferol (VITAMIN D3) 1.25 MG (50000 UT) CAPS Take 1 tablet by mouth once a week.     cyclobenzaprine (FLEXERIL) 10 MG tablet Take 10 mg by mouth daily as needed for muscle spasms.     fluconazole (DIFLUCAN) 150 MG tablet Take 150 mg by mouth once a week.     fluticasone (FLONASE) 50 MCG/ACT nasal spray Place 1 spray into both nostrils daily.     Fluticasone-Salmeterol (WIXELA INHUB) 250-50 MCG/DOSE AEPB Inhale 1 puff into the lungs 2 (two) times daily. 60 each 1   glucose blood (ONE TOUCH ULTRA TEST) test strip USE TO TEST BLOOD SUGAR 3 TIMES DAILY 100 each 3   lisinopril (ZESTRIL) 5 MG tablet Take 5 mg by mouth daily.     magnesium oxide (MAG-OX) 400 (240 Mg) MG tablet Take 1 tablet by mouth daily.     montelukast (SINGULAIR) 10 MG tablet Take 10 mg by mouth every evening.     nystatin (NYSTATIN) powder Apply topically 3 (three) times daily as needed. (Patient taking differently: Apply 1 application topically 3 (three) times daily as needed (chaffing).) 60 g 6   omeprazole (PRILOSEC) 20 MG capsule TAKE 1 CAPSULE (20 MG TOTAL) BY MOUTH DAILY BEFORE BREAKFAST. 90 capsule 2   OZEMPIC, 0.25 OR 0.5 MG/DOSE, 2 MG/1.5ML SOPN Inject 0.5 mg into the skin every Tuesday.     polyvinyl alcohol (LIQUIFILM TEARS) 1.4 % ophthalmic solution Place 1 drop into both eyes as needed for dry eyes.     pregabalin (LYRICA) 200 MG capsule TAKE 1 CAPSULE BY MOUTH THREE TIMES A DAY (Patient taking differently: Take 200 mg by mouth in the morning, at noon, and at bedtime.) 90 capsule 2   promethazine (PHENERGAN) 12.5 MG tablet Take 12.5 mg by mouth every 8 (eight) hours as needed for nausea/vomiting.     promethazine-dextromethorphan (PROMETHAZINE-DM) 6.25-15 MG/5ML syrup Take 5 mLs by mouth every 6 (six) hours as needed.     rosuvastatin (CRESTOR) 10 MG tablet Take 10 mg by mouth daily.     VENTOLIN HFA 108 (90 Base) MCG/ACT inhaler INHALE 2 PUFFS EVERY 6 (SIX) HOURS AS NEEDED INTO THE LUNGS FOR  WHEEZING OR SHORTNESS OF BREATH. (Patient taking differently: Inhale 2 puffs into the lungs every 6 (six) hours as needed for wheezing or shortness of breath.) 18 Inhaler 4   No current facility-administered medications for this visit.    Allergies:   Bee venom, Darvocet [propoxyphene n-acetaminophen], Fish allergy, Morphine and related, Penicillins, Tape, and Fentanyl  Social History:  The patient  reports that she has been smoking cigarettes. She started smoking about 38 years ago. She has a 33.00 pack-year smoking history. She has never used smokeless tobacco. She reports that she does not drink alcohol and does not use drugs.   Family History:  The patient's family history includes Alcohol abuse in her brother; Cancer in her mother; Cirrhosis in her mother; Diabetes in her father; Heart failure in her father; Hypertension in her father.    ROS:  Please see the history of present illness.   Otherwise, review of systems are positive for none.   All other systems are reviewed and negative.    PHYSICAL EXAM: VS:  BP 122/60   Pulse 70   Ht 5' 3"$  (1.6 m)   Wt 274 lb 9.6 oz (124.6 kg)   SpO2 97%   BMI 48.64 kg/m  , BMI Body mass index is 48.64 kg/m. GEN: Well nourished, obese, in no acute distress HEENT: normal Neck: no JVD, carotid bruits, or masses Cardiac: RRR; no murmurs, rubs, or gallops,no edema  Respiratory:  clear to auscultation bilaterally, normal work of breathing GI: soft, nontender, nondistended, + BS MS: no deformity or atrophy Skin: warm and dry, no rash Neuro:  Strength and sensation are intact Psych: euthymic mood, full affect   EKG:  EKG is not ordered today.    Recent Labs: 03/03/2021: ALT 16; BUN 11; Creatinine, Ser 0.85; Hemoglobin 12.5; Platelets 206; Potassium 4.9; Sodium 137  Dated 05/20/20: cholesterol 274, triglycerides 341, HDL 42. Non HDL 232.    Lipid Panel No results found for: CHOL, TRIG, HDL, CHOLHDL, VLDL, LDLCALC, LDLDIRECT    Wt  Readings from Last 3 Encounters:  08/29/21 274 lb 9.6 oz (124.6 kg)  03/02/21 283 lb 15.2 oz (128.8 kg)  02/20/21 284 lb (128.8 kg)      Other studies Reviewed: Additional studies/ records that were reviewed today include:   CLINICAL DATA:  57 year old female current smoker with 32 pack-year history of smoking. Lung cancer screening examination.   EXAM: CT CHEST WITHOUT CONTRAST LOW-DOSE FOR LUNG CANCER SCREENING   TECHNIQUE: Multidetector CT imaging of the chest was performed following the standard protocol without IV contrast.   COMPARISON:  No priors.   FINDINGS: Cardiovascular: Heart size is normal. There is no significant pericardial fluid, thickening or pericardial calcification. There is aortic atherosclerosis, as well as atherosclerosis of the great vessels of the mediastinum and the coronary arteries, including calcified atherosclerotic plaque in the left main, left anterior descending, left circumflex and right coronary arteries. Calcifications of the aortic valve.   Mediastinum/Nodes: No pathologically enlarged mediastinal or hilar lymph nodes. Please note that accurate exclusion of hilar adenopathy is limited on noncontrast CT scans. Esophagus is unremarkable in appearance. No axillary lymphadenopathy.   Lungs/Pleura: No suspicious appearing pulmonary nodules or masses are noted. No acute consolidative airspace disease. No pleural effusions. Diffuse bronchial wall thickening with mild centrilobular and paraseptal emphysema.   Upper Abdomen: Aortic atherosclerosis.  Status post cholecystectomy.   Musculoskeletal: There are no aggressive appearing lytic or blastic lesions noted in the visualized portions of the skeleton.   IMPRESSION: 1. Lung-RADS 1S, negative. Continue annual screening with low-dose chest CT without contrast in 12 months. 2. The "S" modifier above refers to potentially clinically significant non lung cancer related findings. Specifically,  there is aortic atherosclerosis, in addition to left main and 3 vessel coronary artery disease. Please note that although the presence of coronary artery calcium documents  the presence of coronary artery disease, the severity of this disease and any potential stenosis cannot be assessed on this non-gated CT examination. Assessment for potential risk factor modification, dietary therapy or pharmacologic therapy may be warranted, if clinically indicated. 3. Mild diffuse bronchial wall thickening with mild centrilobular and paraseptal emphysema; imaging findings suggestive of underlying COPD.   Aortic Atherosclerosis (ICD10-I70.0) and Emphysema (ICD10-J43.9).     Electronically Signed   By: Vinnie Langton M.D.   On: 04/10/2021 21:36   Myoview 09/03/21: Study Highlights      The study is normal. The study is low risk.   No ST deviation was noted.   Left ventricular function is normal. End diastolic cavity size is mildly enlarged. End systolic cavity size is mildly enlarged.   Prior study available for comparison from 04/08/2015.   Normal stress nuclear study with breast attenuation but no ischemia.  Gated ejection fraction 55% with normal wall motion.  Mild left ventricular enlargement.    ASSESSMENT AND PLAN:  1.  CAD with high degree of coronary calcification. No active chest pain or SOB. Able to perform > 4 mets activity.  Myoview study in Jan 2023 was normal. Recommend she take ASA 81 mg daily. Aggressive risk factor modification. She is cleared for shoulder surgery from my standpoint.  2. HLD. need recent labs to review. Goal LDL < 70. Currently on Crestor 20 mg daily. Will request copy of labs from PCP.  3. HTN controlled 4. DM with neuropathy. Per PCP 5. Tobacco abuse - counseled on importance of cessation 6. Obesity.    Disposition:   FU one year  Addendum: did receive copy of last labs from February 24, 2022. A1c 7.4%. CMET normal. CBC normal. Cholesterol 167,  triglycerides 218, HDl 38, LDL 85. This is a marked improvement from prior.   Signed, Arietta Eisenstein Martinique, MD  08/29/2021 9:57 AM    [facility] [address] [place], [place], [address] Phone [phone], Fax [phone]

## 2022-09-11 ENCOUNTER — Encounter: Payer: Self-pay | Admitting: Internal Medicine

## 2022-09-17 DIAGNOSIS — M67911 Unspecified disorder of synovium and tendon, right shoulder: Secondary | ICD-10-CM | POA: Diagnosis not present

## 2022-09-18 ENCOUNTER — Ambulatory Visit: Payer: 59 | Attending: Cardiology | Admitting: Cardiology

## 2022-09-18 ENCOUNTER — Encounter: Payer: Self-pay | Admitting: Cardiology

## 2022-09-18 VITALS — BP 139/62 | HR 67 | Ht 63.0 in | Wt 274.2 lb

## 2022-09-18 DIAGNOSIS — E782 Mixed hyperlipidemia: Secondary | ICD-10-CM | POA: Diagnosis not present

## 2022-09-18 DIAGNOSIS — E1140 Type 2 diabetes mellitus with diabetic neuropathy, unspecified: Secondary | ICD-10-CM | POA: Diagnosis not present

## 2022-09-18 DIAGNOSIS — Z720 Tobacco use: Secondary | ICD-10-CM

## 2022-09-18 DIAGNOSIS — I25118 Atherosclerotic heart disease of native coronary artery with other forms of angina pectoris: Secondary | ICD-10-CM | POA: Diagnosis not present

## 2022-09-18 DIAGNOSIS — I10 Essential (primary) hypertension: Secondary | ICD-10-CM

## 2022-09-18 NOTE — Patient Instructions (Signed)
Medication Instructions:  Continue current medication  *If you need a refill on your cardiac medications before your next appointment, please call your pharmacy*   Lab Work: None Ordered   Testing/Procedures: None Ordered   Follow-Up: At [facility], you and your health needs are our priority.  As part of our continuing mission to provide you with exceptional heart care, we have created designated Provider Care Teams.  These Care Teams include your primary Cardiologist (physician) and Advanced Practice Providers (APPs -  Physician Assistants and Nurse Practitioners) who all work together to provide you with the care you need, when you need it.  We recommend signing up for the patient portal called "MyChart".  Sign up information is provided on this After Visit Summary.  MyChart is used to connect with patients for Virtual Visits (Telemedicine).  Patients are able to view lab/test results, encounter notes, upcoming appointments, etc.  Non-urgent messages can be sent to your provider as well.   To learn more about what you can do with MyChart, go to [email or web address].    Your next appointment:   1 year(s)  Provider:   Dr Peter Martinique   Other Instructions

## 2022-09-28 ENCOUNTER — Other Ambulatory Visit (HOSPITAL_COMMUNITY): Payer: Self-pay

## 2022-09-29 ENCOUNTER — Other Ambulatory Visit (HOSPITAL_COMMUNITY): Payer: Self-pay

## 2022-09-29 MED ORDER — FLUTICASONE-SALMETEROL 250-50 MCG/ACT IN AEPB
1.0000 | INHALATION_SPRAY | Freq: Two times a day (BID) | RESPIRATORY_TRACT | 0 refills | Status: DC
  Filled 2022-09-29: qty 60, 30d supply, fill #0

## 2022-09-29 MED ORDER — FLUTICASONE-SALMETEROL 250-50 MCG/ACT IN AEPB
1.0000 | INHALATION_SPRAY | Freq: Two times a day (BID) | RESPIRATORY_TRACT | 0 refills | Status: DC
Start: 2022-09-28 — End: 2022-11-11

## 2022-09-30 DIAGNOSIS — R1031 Right lower quadrant pain: Secondary | ICD-10-CM | POA: Diagnosis not present

## 2022-09-30 DIAGNOSIS — N3001 Acute cystitis with hematuria: Secondary | ICD-10-CM | POA: Diagnosis not present

## 2022-09-30 DIAGNOSIS — N1330 Unspecified hydronephrosis: Secondary | ICD-10-CM | POA: Diagnosis not present

## 2022-09-30 DIAGNOSIS — I700 Atherosclerosis of aorta: Secondary | ICD-10-CM | POA: Diagnosis not present

## 2022-09-30 DIAGNOSIS — K429 Umbilical hernia without obstruction or gangrene: Secondary | ICD-10-CM | POA: Diagnosis not present

## 2022-09-30 DIAGNOSIS — Z87442 Personal history of urinary calculi: Secondary | ICD-10-CM | POA: Diagnosis not present

## 2022-10-13 ENCOUNTER — Other Ambulatory Visit (HOSPITAL_COMMUNITY): Payer: Self-pay

## 2022-10-13 MED ORDER — FLUTICASONE-SALMETEROL 250-50 MCG/ACT IN AEPB
1.0000 | INHALATION_SPRAY | Freq: Two times a day (BID) | RESPIRATORY_TRACT | 2 refills | Status: AC
  Filled 2022-10-13: qty 60, 30d supply, fill #0

## 2022-10-13 MED ORDER — NYSTATIN 100000 UNIT/GM EX POWD
Freq: Two times a day (BID) | CUTANEOUS | 2 refills | Status: DC
  Filled 2022-10-13: qty 15, 15d supply, fill #0

## 2022-10-13 MED ORDER — ALBUTEROL SULFATE HFA 108 (90 BASE) MCG/ACT IN AERS
1.0000 | INHALATION_SPRAY | Freq: Four times a day (QID) | RESPIRATORY_TRACT | 3 refills | Status: DC | PRN
  Filled 2022-10-13: qty 6.7, 25d supply, fill #0

## 2022-10-22 ENCOUNTER — Other Ambulatory Visit (HOSPITAL_COMMUNITY): Payer: Self-pay

## 2022-10-28 DIAGNOSIS — M75121 Complete rotator cuff tear or rupture of right shoulder, not specified as traumatic: Secondary | ICD-10-CM | POA: Diagnosis not present

## 2022-10-30 DIAGNOSIS — N1330 Unspecified hydronephrosis: Secondary | ICD-10-CM | POA: Diagnosis not present

## 2022-10-30 DIAGNOSIS — N200 Calculus of kidney: Secondary | ICD-10-CM | POA: Diagnosis not present

## 2022-10-30 DIAGNOSIS — N390 Urinary tract infection, site not specified: Secondary | ICD-10-CM | POA: Diagnosis not present

## 2022-10-30 DIAGNOSIS — N3289 Other specified disorders of bladder: Secondary | ICD-10-CM | POA: Diagnosis not present

## 2022-10-31 DIAGNOSIS — N390 Urinary tract infection, site not specified: Secondary | ICD-10-CM | POA: Diagnosis not present

## 2022-10-31 DIAGNOSIS — F17210 Nicotine dependence, cigarettes, uncomplicated: Secondary | ICD-10-CM | POA: Diagnosis not present

## 2022-10-31 DIAGNOSIS — I10 Essential (primary) hypertension: Secondary | ICD-10-CM | POA: Diagnosis not present

## 2022-11-05 ENCOUNTER — Telehealth: Payer: Self-pay | Admitting: *Deleted

## 2022-11-05 NOTE — Telephone Encounter (Signed)
     Patient  visit on 10/31/2022  at [facility]  was for treatment   Have you been able to follow up with your primary care physician?have an appt with the kidney dr on the 4th for follow up .Marland Kitchen   The patient was able to obtain any needed medicine or equipment.  Are there diet recommendations that you are having difficulty following?  Patient expresses understanding of discharge instructions and education provided has no other needs at this time.  [place] [phone] [address] , [place] [address] Email : [email or web address]

## 2022-11-09 ENCOUNTER — Inpatient Hospital Stay (HOSPITAL_COMMUNITY)
Admission: EM | Admit: 2022-11-09 | Discharge: 2022-11-11 | DRG: 690 | Disposition: A | Discharge status: Home / Self Care | Payer: 59 | Attending: Internal Medicine | Admitting: Internal Medicine

## 2022-11-09 ENCOUNTER — Encounter (HOSPITAL_COMMUNITY): Payer: Self-pay

## 2022-11-09 ENCOUNTER — Other Ambulatory Visit: Payer: Self-pay

## 2022-11-09 ENCOUNTER — Emergency Department (HOSPITAL_COMMUNITY): Payer: 59

## 2022-11-09 DIAGNOSIS — E1165 Type 2 diabetes mellitus with hyperglycemia: Secondary | ICD-10-CM | POA: Diagnosis present

## 2022-11-09 DIAGNOSIS — M1990 Unspecified osteoarthritis, unspecified site: Secondary | ICD-10-CM | POA: Diagnosis present

## 2022-11-09 DIAGNOSIS — Z8701 Personal history of pneumonia (recurrent): Secondary | ICD-10-CM

## 2022-11-09 DIAGNOSIS — K219 Gastro-esophageal reflux disease without esophagitis: Secondary | ICD-10-CM | POA: Diagnosis not present

## 2022-11-09 DIAGNOSIS — Z79899 Other long term (current) drug therapy: Secondary | ICD-10-CM

## 2022-11-09 DIAGNOSIS — Z8249 Family history of ischemic heart disease and other diseases of the circulatory system: Secondary | ICD-10-CM

## 2022-11-09 DIAGNOSIS — N3090 Cystitis, unspecified without hematuria: Principal | ICD-10-CM

## 2022-11-09 DIAGNOSIS — D649 Anemia, unspecified: Secondary | ICD-10-CM | POA: Diagnosis not present

## 2022-11-09 DIAGNOSIS — I10 Essential (primary) hypertension: Secondary | ICD-10-CM | POA: Diagnosis present

## 2022-11-09 DIAGNOSIS — J45909 Unspecified asthma, uncomplicated: Secondary | ICD-10-CM | POA: Diagnosis present

## 2022-11-09 DIAGNOSIS — Z90710 Acquired absence of both cervix and uterus: Secondary | ICD-10-CM

## 2022-11-09 DIAGNOSIS — N136 Pyonephrosis: Principal | ICD-10-CM | POA: Diagnosis present

## 2022-11-09 DIAGNOSIS — Z9109 Other allergy status, other than to drugs and biological substances: Secondary | ICD-10-CM

## 2022-11-09 DIAGNOSIS — Z6841 Body Mass Index (BMI) 40.0 and over, adult: Secondary | ICD-10-CM

## 2022-11-09 DIAGNOSIS — E860 Dehydration: Secondary | ICD-10-CM | POA: Diagnosis present

## 2022-11-09 DIAGNOSIS — E1140 Type 2 diabetes mellitus with diabetic neuropathy, unspecified: Secondary | ICD-10-CM | POA: Diagnosis not present

## 2022-11-09 DIAGNOSIS — Z91013 Allergy to seafood: Secondary | ICD-10-CM

## 2022-11-09 DIAGNOSIS — Z7985 Long-term (current) use of injectable non-insulin antidiabetic drugs: Secondary | ICD-10-CM

## 2022-11-09 DIAGNOSIS — Z7982 Long term (current) use of aspirin: Secondary | ICD-10-CM

## 2022-11-09 DIAGNOSIS — L03311 Cellulitis of abdominal wall: Secondary | ICD-10-CM | POA: Diagnosis not present

## 2022-11-09 DIAGNOSIS — G43909 Migraine, unspecified, not intractable, without status migrainosus: Secondary | ICD-10-CM | POA: Diagnosis not present

## 2022-11-09 DIAGNOSIS — E785 Hyperlipidemia, unspecified: Secondary | ICD-10-CM | POA: Diagnosis present

## 2022-11-09 DIAGNOSIS — N12 Tubulo-interstitial nephritis, not specified as acute or chronic: Secondary | ICD-10-CM | POA: Diagnosis present

## 2022-11-09 DIAGNOSIS — N1330 Unspecified hydronephrosis: Secondary | ICD-10-CM | POA: Diagnosis not present

## 2022-11-09 DIAGNOSIS — Z91030 Bee allergy status: Secondary | ICD-10-CM

## 2022-11-09 DIAGNOSIS — N3011 Interstitial cystitis (chronic) with hematuria: Secondary | ICD-10-CM | POA: Diagnosis not present

## 2022-11-09 DIAGNOSIS — Z880 Allergy status to penicillin: Secondary | ICD-10-CM | POA: Diagnosis not present

## 2022-11-09 DIAGNOSIS — M797 Fibromyalgia: Secondary | ICD-10-CM | POA: Diagnosis present

## 2022-11-09 DIAGNOSIS — N132 Hydronephrosis with renal and ureteral calculous obstruction: Secondary | ICD-10-CM | POA: Diagnosis not present

## 2022-11-09 DIAGNOSIS — F17210 Nicotine dependence, cigarettes, uncomplicated: Secondary | ICD-10-CM | POA: Diagnosis not present

## 2022-11-09 DIAGNOSIS — N10 Acute tubulo-interstitial nephritis: Secondary | ICD-10-CM | POA: Diagnosis not present

## 2022-11-09 DIAGNOSIS — G8929 Other chronic pain: Secondary | ICD-10-CM | POA: Diagnosis present

## 2022-11-09 DIAGNOSIS — Z720 Tobacco use: Secondary | ICD-10-CM | POA: Diagnosis present

## 2022-11-09 DIAGNOSIS — Z87442 Personal history of urinary calculi: Secondary | ICD-10-CM | POA: Diagnosis not present

## 2022-11-09 DIAGNOSIS — Z885 Allergy status to narcotic agent status: Secondary | ICD-10-CM

## 2022-11-09 DIAGNOSIS — J4520 Mild intermittent asthma, uncomplicated: Secondary | ICD-10-CM

## 2022-11-09 DIAGNOSIS — Z87440 Personal history of urinary (tract) infections: Secondary | ICD-10-CM | POA: Diagnosis present

## 2022-11-09 DIAGNOSIS — R1030 Lower abdominal pain, unspecified: Secondary | ICD-10-CM | POA: Diagnosis present

## 2022-11-09 DIAGNOSIS — Z833 Family history of diabetes mellitus: Secondary | ICD-10-CM

## 2022-11-09 DIAGNOSIS — L03818 Cellulitis of other sites: Secondary | ICD-10-CM

## 2022-11-09 DIAGNOSIS — F419 Anxiety disorder, unspecified: Secondary | ICD-10-CM | POA: Diagnosis present

## 2022-11-09 DIAGNOSIS — Z888 Allergy status to other drugs, medicaments and biological substances status: Secondary | ICD-10-CM

## 2022-11-09 DIAGNOSIS — K589 Irritable bowel syndrome without diarrhea: Secondary | ICD-10-CM | POA: Diagnosis present

## 2022-11-09 DIAGNOSIS — Z7951 Long term (current) use of inhaled steroids: Secondary | ICD-10-CM

## 2022-11-09 LAB — CBC WITH DIFFERENTIAL/PLATELET
Abs Immature Granulocytes: 0.02 10*3/uL (ref 0.00–0.07)
Basophils Absolute: 0 10*3/uL (ref 0.0–0.1)
Basophils Relative: 1 %
Eosinophils Absolute: 0.3 10*3/uL (ref 0.0–0.5)
Eosinophils Relative: 6 %
HCT: 36.8 % (ref 36.0–46.0)
Hemoglobin: 11.5 g/dL — ABNORMAL LOW (ref 12.0–15.0)
Immature Granulocytes: 0 %
Lymphocytes Relative: 24 %
Lymphs Abs: 1.3 10*3/uL (ref 0.7–4.0)
MCH: 27.1 pg (ref 26.0–34.0)
MCHC: 31.3 g/dL (ref 30.0–36.0)
MCV: 86.8 fL (ref 80.0–100.0)
Monocytes Absolute: 0.3 10*3/uL (ref 0.1–1.0)
Monocytes Relative: 5 %
Neutro Abs: 3.4 10*3/uL (ref 1.7–7.7)
Neutrophils Relative %: 64 %
Platelets: 225 10*3/uL (ref 150–400)
RBC: 4.24 MIL/uL (ref 3.87–5.11)
RDW: 16.2 % — ABNORMAL HIGH (ref 11.5–15.5)
WBC: 5.2 10*3/uL (ref 4.0–10.5)
nRBC: 0 % (ref 0.0–0.2)

## 2022-11-09 LAB — URINALYSIS, ROUTINE W REFLEX MICROSCOPIC
Bilirubin Urine: NEGATIVE
Glucose, UA: 50 mg/dL — AB
Ketones, ur: NEGATIVE mg/dL
Nitrite: NEGATIVE
Protein, ur: 100 mg/dL — AB
RBC / HPF: >50 RBC/hpf (ref 0–5)
Specific Gravity, Urine: 1.013 (ref 1.005–1.030)
WBC, UA: >50 WBC/hpf (ref 0–5)
pH: 5 (ref 5.0–8.0)

## 2022-11-09 LAB — CBC
HCT: 37.5 % (ref 36.0–46.0)
Hemoglobin: 11.8 g/dL — ABNORMAL LOW (ref 12.0–15.0)
MCH: 27.3 pg (ref 26.0–34.0)
MCHC: 31.5 g/dL (ref 30.0–36.0)
MCV: 86.6 fL (ref 80.0–100.0)
Platelets: 234 10*3/uL (ref 150–400)
RBC: 4.33 MIL/uL (ref 3.87–5.11)
RDW: 16 % — ABNORMAL HIGH (ref 11.5–15.5)
WBC: 4.9 10*3/uL (ref 4.0–10.5)
nRBC: 0 % (ref 0.0–0.2)

## 2022-11-09 LAB — CREATININE, SERUM
Creatinine, Ser: 1.02 mg/dL — ABNORMAL HIGH (ref 0.44–1.00)
GFR, Estimated: >60 mL/min (ref >60)

## 2022-11-09 LAB — GLUCOSE, CAPILLARY: Glucose-Capillary: 144 mg/dL — ABNORMAL HIGH (ref 70–99)

## 2022-11-09 LAB — COMPREHENSIVE METABOLIC PANEL
ALT: 27 U/L (ref 0–44)
AST: 24 U/L (ref 15–41)
Albumin: 3.1 g/dL — ABNORMAL LOW (ref 3.5–5.0)
Alkaline Phosphatase: 78 U/L (ref 38–126)
Anion gap: 5 (ref 5–15)
BUN: 22 mg/dL — ABNORMAL HIGH (ref 6–20)
CO2: 19 mmol/L — ABNORMAL LOW (ref 22–32)
Calcium: 7.9 mg/dL — ABNORMAL LOW (ref 8.9–10.3)
Chloride: 113 mmol/L — ABNORMAL HIGH (ref 98–111)
Creatinine, Ser: 0.94 mg/dL (ref 0.44–1.00)
GFR, Estimated: >60 mL/min (ref >60)
Glucose, Bld: 180 mg/dL — ABNORMAL HIGH (ref 70–99)
Potassium: 3.9 mmol/L (ref 3.5–5.1)
Sodium: 137 mmol/L (ref 135–145)
Total Bilirubin: 0.2 mg/dL — ABNORMAL LOW (ref 0.3–1.2)
Total Protein: 6.1 g/dL — ABNORMAL LOW (ref 6.5–8.1)

## 2022-11-09 LAB — HIV ANTIBODY (ROUTINE TESTING W REFLEX): HIV Screen 4th Generation wRfx: NONREACTIVE

## 2022-11-09 LAB — LIPASE, BLOOD: Lipase: 37 U/L (ref 11–51)

## 2022-11-09 MED ORDER — SODIUM CHLORIDE 0.9 % IV SOLN
1.0000 g | Freq: Once | INTRAVENOUS | Status: AC
  Administered 2022-11-09: 1 g via INTRAVENOUS
  Filled 2022-11-09: qty 10

## 2022-11-09 MED ORDER — ONDANSETRON HCL 4 MG PO TABS: 4.0000 mg | ORAL_TABLET | Freq: Four times a day (QID) | ORAL | Status: DC | PRN

## 2022-11-09 MED ORDER — HYDROCODONE-ACETAMINOPHEN 5-325 MG PO TABS
1.0000 | ORAL_TABLET | Freq: Four times a day (QID) | ORAL | Status: DC | PRN
  Administered 2022-11-09 – 2022-11-10 (×4): 1 via ORAL
  Filled 2022-11-09 (×4): qty 1

## 2022-11-09 MED ORDER — PREGABALIN 50 MG PO CAPS
200.0000 mg | ORAL_CAPSULE | Freq: Three times a day (TID) | ORAL | Status: DC
  Administered 2022-11-09 – 2022-11-11 (×6): 200 mg via ORAL
  Filled 2022-11-09 (×6): qty 4

## 2022-11-09 MED ORDER — SODIUM CHLORIDE 0.9 % IV BOLUS
1000.0000 mL | Freq: Once | INTRAVENOUS | Status: AC
  Administered 2022-11-09: 1000 mL via INTRAVENOUS

## 2022-11-09 MED ORDER — DIPHENHYDRAMINE HCL 50 MG/ML IJ SOLN
12.5000 mg | Freq: Once | INTRAMUSCULAR | Status: AC
Start: 2022-11-09 — End: 2022-11-09
  Administered 2022-11-09: 12.5 mg via INTRAVENOUS
  Filled 2022-11-09: qty 1

## 2022-11-09 MED ORDER — HYDROMORPHONE HCL 1 MG/ML IJ SOLN
0.5000 mg | Freq: Once | INTRAMUSCULAR | Status: AC
  Administered 2022-11-09: 0.5 mg via INTRAVENOUS
  Filled 2022-11-09: qty 1

## 2022-11-09 MED ORDER — INSULIN ASPART 100 UNIT/ML IJ SOLN
0.0000 [IU] | Freq: Three times a day (TID) | INTRAMUSCULAR | Status: DC
  Administered 2022-11-09 – 2022-11-10 (×3): 1 [IU] via SUBCUTANEOUS
  Administered 2022-11-10 – 2022-11-11 (×2): 2 [IU] via SUBCUTANEOUS
  Filled 2022-11-09: qty 0.09

## 2022-11-09 MED ORDER — INSULIN ASPART 100 UNIT/ML IJ SOLN
0.0000 [IU] | Freq: Every day | INTRAMUSCULAR | Status: DC
  Filled 2022-11-09: qty 0.05

## 2022-11-09 MED ORDER — ONDANSETRON HCL 4 MG/2ML IJ SOLN
4.0000 mg | Freq: Once | INTRAMUSCULAR | Status: AC
  Administered 2022-11-09: 4 mg via INTRAVENOUS
  Filled 2022-11-09: qty 2

## 2022-11-09 MED ORDER — DOCUSATE SODIUM 100 MG PO CAPS
100.0000 mg | ORAL_CAPSULE | Freq: Two times a day (BID) | ORAL | Status: DC
  Administered 2022-11-09 – 2022-11-11 (×3): 100 mg via ORAL
  Filled 2022-11-09 (×4): qty 1

## 2022-11-09 MED ORDER — AMITRIPTYLINE HCL 10 MG PO TABS
10.0000 mg | ORAL_TABLET | Freq: Every day | ORAL | Status: DC
  Administered 2022-11-09 – 2022-11-10 (×2): 10 mg via ORAL
  Filled 2022-11-09 (×2): qty 1

## 2022-11-09 MED ORDER — ROSUVASTATIN CALCIUM 20 MG PO TABS
20.0000 mg | ORAL_TABLET | Freq: Every day | ORAL | Status: DC
  Administered 2022-11-09 – 2022-11-11 (×3): 20 mg via ORAL
  Filled 2022-11-09 (×3): qty 1

## 2022-11-09 MED ORDER — HYDRALAZINE HCL 20 MG/ML IJ SOLN: 5.0000 mg | Freq: Four times a day (QID) | INTRAMUSCULAR | Status: DC | PRN

## 2022-11-09 MED ORDER — OXYCODONE-ACETAMINOPHEN 5-325 MG PO TABS
1.0000 | ORAL_TABLET | Freq: Once | ORAL | Status: AC
  Administered 2022-11-09: 1 via ORAL
  Filled 2022-11-09: qty 1

## 2022-11-09 MED ORDER — ALPRAZOLAM 0.5 MG PO TABS: 1.0000 mg | ORAL_TABLET | Freq: Two times a day (BID) | ORAL | Status: DC | PRN

## 2022-11-09 MED ORDER — SODIUM CHLORIDE 0.9 % IV SOLN
2.0000 g | INTRAVENOUS | Status: DC
  Administered 2022-11-10 – 2022-11-11 (×2): 2 g via INTRAVENOUS
  Filled 2022-11-09 (×2): qty 20

## 2022-11-09 MED ORDER — MIRABEGRON ER 25 MG PO TB24
50.0000 mg | ORAL_TABLET | Freq: Every day | ORAL | Status: DC
  Administered 2022-11-09 – 2022-11-11 (×3): 50 mg via ORAL
  Filled 2022-11-09 (×3): qty 2

## 2022-11-09 MED ORDER — PANTOPRAZOLE SODIUM 40 MG PO TBEC
40.0000 mg | DELAYED_RELEASE_TABLET | Freq: Every day | ORAL | Status: DC
  Administered 2022-11-09 – 2022-11-11 (×3): 40 mg via ORAL
  Filled 2022-11-09 (×3): qty 1

## 2022-11-09 MED ORDER — CYCLOBENZAPRINE HCL 10 MG PO TABS: 10.0000 mg | ORAL_TABLET | Freq: Every day | ORAL | Status: DC | PRN

## 2022-11-09 MED ORDER — ASPIRIN 81 MG PO TBEC
81.0000 mg | DELAYED_RELEASE_TABLET | Freq: Every day | ORAL | Status: DC
  Administered 2022-11-09 – 2022-11-11 (×3): 81 mg via ORAL
  Filled 2022-11-09 (×3): qty 1

## 2022-11-09 MED ORDER — DIPHENHYDRAMINE HCL 25 MG PO CAPS
50.0000 mg | ORAL_CAPSULE | ORAL | Status: DC | PRN
  Administered 2022-11-09: 50 mg via ORAL
  Filled 2022-11-09: qty 2

## 2022-11-09 MED ORDER — SODIUM CHLORIDE 0.9 % IV SOLN
6.2500 mg | Freq: Four times a day (QID) | INTRAVENOUS | Status: DC | PRN
  Administered 2022-11-09 – 2022-11-11 (×5): 6.25 mg via INTRAVENOUS
  Filled 2022-11-09 (×10): qty 0.25

## 2022-11-09 MED ORDER — POTASSIUM CHLORIDE IN NACL 20-0.9 MEQ/L-% IV SOLN
INTRAVENOUS | Status: DC
  Filled 2022-11-09 (×3): qty 1000

## 2022-11-09 MED ORDER — POLYETHYLENE GLYCOL 3350 17 G PO PACK: 17.0000 g | PACK | Freq: Every day | ORAL | Status: DC | PRN

## 2022-11-09 MED ORDER — ONDANSETRON HCL 4 MG/2ML IJ SOLN
4.0000 mg | Freq: Four times a day (QID) | INTRAMUSCULAR | Status: DC | PRN
  Administered 2022-11-09: 4 mg via INTRAVENOUS
  Filled 2022-11-09: qty 2

## 2022-11-09 MED ORDER — ENOXAPARIN SODIUM 60 MG/0.6ML IJ SOSY
60.0000 mg | PREFILLED_SYRINGE | Freq: Every day | INTRAMUSCULAR | Status: DC
  Administered 2022-11-09 – 2022-11-10 (×2): 60 mg via SUBCUTANEOUS
  Filled 2022-11-09 (×2): qty 0.6

## 2022-11-09 MED ORDER — OXYBUTYNIN CHLORIDE 5 MG PO TABS
5.0000 mg | ORAL_TABLET | Freq: Three times a day (TID) | ORAL | Status: DC | PRN
  Administered 2022-11-10 – 2022-11-11 (×3): 5 mg via ORAL
  Filled 2022-11-09 (×3): qty 1

## 2022-11-09 MED ORDER — LISINOPRIL 10 MG PO TABS
5.0000 mg | ORAL_TABLET | Freq: Every day | ORAL | Status: DC
  Administered 2022-11-09 – 2022-11-11 (×3): 5 mg via ORAL
  Filled 2022-11-09 (×3): qty 1

## 2022-11-09 NOTE — ED Triage Notes (Signed)
Pt c/o left sided flank pain that has become pelvic pain. Pt is concerned for stones. Pt has had painful urination x 2 weeks. Pt states she now has blood in urine

## 2022-11-09 NOTE — ED Provider Notes (Signed)
[facility] Provider Note   CSN: [id number] Arrival date & time: 11/09/22  [id number]     History  Chief Complaint  Patient presents with   Dysuria    Gina [name] is a 57 y.o. female history of nephrolithiasis, pyelonephritis, diabetes, hypertension presented with 2 weeks of dysuria and lower abdominal pain.  Patient states she is now endorsing hematuria that began yesterday and that this feels similar to previous stones.  Patient states she has been nauseous with episodes of nonbloody emesis for the past few days and has not eaten in the past 24 hours due to nausea.  Patient states she is able to tolerate small amounts of fluid orally.  Patient is taking Tylenol, ibuprofen, aspirin for pain relief however still endorses pain.  Patient states her lower abdominal pain radiates bilaterally around her flanks to her back.  Patient endorsed subjective fevers/chills at home.  Patient states she is still having bowel movements and urinating however she is having lower urine output due to not being on take in plenty of fluids.  Patient denied chest pain, shortness of breath, hematemesis, vaginal discharge, changes in sensation/motor skills, syncope, fatigue, vision changes, headache  Home Medications Prior to Admission medications   Medication Sig Start Date End Date Taking? Authorizing Provider  acetaminophen (TYLENOL) 500 MG tablet Take 1,000 mg by mouth every 6 (six) hours as needed for mild pain.    [provider]  albuterol (VENTOLIN HFA) 108 (90 Base) MCG/ACT inhaler Inhale 1-2 puffs into the lungs every 6-8 hours as needed. 10/12/22     ALPRAZolam (XANAX) 1 MG tablet Take 1 mg by mouth 2 (two) times daily as needed for anxiety.     [provider]  amitriptyline (ELAVIL) 10 MG tablet Take 10 mg by mouth at bedtime.    [provider]  aspirin EC 81 MG tablet Take 1 tablet (81 mg total) by mouth daily. Swallow whole.  08/29/21   Martin[place], Peter M, MD  cyclobenzaprine (FLEXERIL) 10 MG tablet Take 10 mg by mouth daily as needed for muscle spasms. 12/05/20   [provider]  fluticasone (FLONASE) 50 MCG/ACT nasal spray Place 1-2 sprays into both nostrils daily. 06/18/16   [provider]  fluticasone-salmeterol (WIXELA INHUB) 250-50 MCG/ACT AEPB Inhale 1 puff into the lungs 2 (two) times daily. 09/28/22     fluticasone-salmeterol (WIXELA INHUB) 250-50 MCG/ACT AEPB Inhale 1 puff into the lungs 2 (two) times daily. 09/28/22     fluticasone-salmeterol (WIXELA INHUB) 250-50 MCG/ACT AEPB Inhale 1 puff into the lungs 2 (two) times daily. 10/12/22     Fluticasone-Salmeterol (WIXELA INHUB) 250-50 MCG/DOSE AEPB Inhale 1 puff into the lungs 2 (two) times daily. Patient taking differently: Inhale 1 puff into the lungs in the morning. 12/22/18   [place], Betty G, MD  glucose blood (ONE TOUCH ULTRA TEST) test strip USE TO TEST BLOOD SUGAR 3 TIMES DAILY 06/16/18   [place], Betty G, MD  HYDROcodone-acetaminophen (NORCO/VICODIN) 5-325 MG tablet Take 1 tablet by mouth every 6 (six) hours as needed for moderate pain.    [provider]  lisinopril (ZESTRIL) 5 MG tablet Take 5 mg by mouth daily.    [provider]  magnesium oxide (MAG-OX) 400 (240 Mg) MG tablet Take 400 mg by mouth daily. 07/30/21   [provider]  montelukast (SINGULAIR) 10 MG tablet Take 10 mg by mouth at bedtime. 12/06/20   [provider]  MYRBETRIQ 50 MG  TB24 tablet Take 50 mg by mouth daily.    [provider]  nystatin (MYCOSTATIN/NYSTOP) powder Apply topically 2 (two) times daily to affected area 10/12/22     nystatin (NYSTATIN) powder Apply topically 3 (three) times daily as needed. Patient taking differently: Apply 1 application  topically 3 (three) times daily as needed (chaffing/rashes). 01/17/18   [place], Betty G, MD  omeprazole (PRILOSEC) 20 MG capsule TAKE 1 CAPSULE (20 MG TOTAL) BY MOUTH DAILY BEFORE  BREAKFAST. 07/03/19   [place], Betty G, MD  OZEMPIC, 2 MG/DOSE, 8 MG/3ML SOPN Inject 2 mg into the skin every Wednesday. 10/23/21   [provider]  pregabalin (LYRICA) 200 MG capsule TAKE 1 CAPSULE BY MOUTH THREE TIMES A DAY Patient taking differently: Take 200 mg by mouth 3 (three) times daily. 01/17/18   [place], Betty G, MD  promethazine (PHENERGAN) 25 MG tablet Take 25 mg by mouth every 8 (eight) hours as needed for nausea or vomiting. 08/08/21   [provider]  rosuvastatin (CRESTOR) 20 MG tablet Take 20 mg by mouth daily. 07/11/21   [provider]  Semaglutide, 2 MG/DOSE, (OZEMPIC, 2 MG/DOSE,) 8 MG/3ML SOPN Inject 2 mg into the skin once a week. 07/13/22     VENTOLIN HFA 108 (90 Base) MCG/ACT inhaler INHALE 2 PUFFS EVERY 6 (SIX) HOURS AS NEEDED INTO THE LUNGS FOR WHEEZING OR SHORTNESS OF BREATH. Patient taking differently: Inhale 2 puffs into the lungs every 4 (four) hours as needed for wheezing or shortness of breath. 06/02/18   [place], Betty G, MD  Vitamin D, Ergocalciferol, (DRISDOL) 1.25 MG (50000 UNIT) CAPS capsule Take 50,000 Units by mouth every Monday.    [provider]      Allergies    Bee venom, Darvocet [propoxyphene n-acetaminophen], Farxiga [dapagliflozin], Fish allergy, Morphine and related, Penicillins, Tape, and Fentanyl    Review of Systems   Review of Systems  Genitourinary:  Positive for dysuria.  See HPI  Physical Exam Updated Vital Signs BP 130/67   Pulse 73   Temp 97.6 F (36.4 C) (Oral)   Resp 20   Ht 5\' 3"  (1.6 m)   Wt 120.2 kg   SpO2 98%   BMI 46.94 kg/m  Physical Exam Vitals reviewed.  Constitutional:      General: She is not in acute distress. HENT:     Head: Normocephalic and atraumatic.  Eyes:     Extraocular Movements: Extraocular movements intact.     Conjunctiva/sclera: Conjunctivae normal.     Pupils: Pupils are equal, round, and reactive to light.  Cardiovascular:     Rate and Rhythm: Normal rate and  regular rhythm.     Pulses: Normal pulses.     Heart sounds: Normal heart sounds.     Comments: 2+ bilateral radial/dorsalis pedis pulses with regular rate Pulmonary:     Effort: Pulmonary effort is normal. No respiratory distress.     Breath sounds: Normal breath sounds.  Abdominal:     Palpations: Abdomen is soft.     Tenderness: There is abdominal tenderness (Lower abdominal). There is right CVA tenderness and left CVA tenderness. There is no guarding or rebound.  Musculoskeletal:        General: Normal range of motion.     Cervical back: Normal range of motion and neck supple.     Comments: 5 out of 5 bilateral grip/leg extension strength  Skin:    General: Skin is warm and dry.     Capillary Refill: Capillary  refill takes less than 2 seconds.     Comments: Erythema noted in the right lower abdominal region that is nontender to palpation, warm to the touch, no area of fluctuance  Neurological:     General: No focal deficit present.     Mental Status: She is alert and oriented to person, place, and time.     Comments: Sensation intact in all 4 limbs  Psychiatric:        Mood and Affect: Mood normal.     ED Results / Procedures / Treatments   Labs (all labs ordered are listed, but only abnormal results are displayed) Labs Reviewed  CBC WITH DIFFERENTIAL/PLATELET - Abnormal; Notable for the following components:      Result Value   Hemoglobin 11.5 (*)    RDW 16.2 (*)    All other components within normal limits  URINALYSIS, ROUTINE W REFLEX MICROSCOPIC - Abnormal; Notable for the following components:   APPearance CLOUDY (*)    Glucose, UA 50 (*)    Hgb urine dipstick LARGE (*)    Protein, ur 100 (*)    Leukocytes,Ua LARGE (*)    Bacteria, UA RARE (*)    All other components within normal limits  COMPREHENSIVE METABOLIC PANEL - Abnormal; Notable for the following components:   Chloride 113 (*)    CO2 19 (*)    Glucose, Bld 180 (*)    BUN 22 (*)    Calcium 7.9 (*)     Total Protein 6.1 (*)    Albumin 3.1 (*)    Total Bilirubin 0.2 (*)    All other components within normal limits  URINE CULTURE  LIPASE, BLOOD    EKG None  Radiology CT Renal Stone Study  Result Date: 11/09/2022 CLINICAL DATA:  Left flank pain EXAM: CT ABDOMEN AND PELVIS WITHOUT CONTRAST TECHNIQUE: Multidetector CT imaging of the abdomen and pelvis was performed following the standard protocol without IV contrast. RADIATION DOSE REDUCTION: This exam was performed according to the departmental dose-optimization program which includes automated exposure control, adjustment of the mA and/or kV according to patient size and/or use of iterative reconstruction technique. COMPARISON:  CT renal stone dated March 22nd 2024 FINDINGS: Lower chest: No acute abnormality. Hepatobiliary: No focal liver abnormality is seen. Status post cholecystectomy. No biliary dilatation. Pancreas: Unremarkable. No pancreatic ductal dilatation or surrounding inflammatory changes. Spleen: Normal in size without focal abnormality. Adrenals/Urinary Tract: Bilateral adrenal glands are unremarkable. Moderate left hydronephrosis hydroureter, unchanged when compared with the prior exam. Severe scarring of the upper pole of the right kidney, unchanged when compared with the prior. Low-attenuation lesion of the anterior left kidney better visualized on prior contrast-enhanced exam and likely simple cyst, no specific follow-up imaging is recommended. Nonobstructing lower pole left renal stone. Bladder wall thickening and perivesicular fat stranding. Stomach/Bowel: Stomach is within normal limits. Appendix appears normal. Mild diverticulosis no evidence of bowel wall thickening, distention, or inflammatory changes. Vascular/Lymphatic: Aortic atherosclerosis. No enlarged abdominal or pelvic lymph nodes. Reproductive: Status post hysterectomy. No adnexal masses. Other: Postsurgical changes of the anterior abdomen. Multiple small fat containing  ventral hernias, unchanged when compared with the prior exam. Subcutaneous fat stranding and skin thickening of the lower abdominal wall. Musculoskeletal: No acute or significant osseous findings. IMPRESSION: 1. Moderate left hydronephrosis and hydroureter, unchanged when compared with the prior exam. No ureter stone visualized. Possibly secondary to ascending infection. 2. Bladder wall thickening and perivesicular fat stranding, findings can be seen in the setting of cystitis. Correlate  with urinalysis. 3. Subcutaneous fat stranding and skin thickening of the lower abdominal wall, correlate with physical exam for cellulitis. 4. Aortic Atherosclerosis (ICD10-I70.0). Electronically Signed   By: Yetta Glassman M.D.   On: 11/09/2022 09:14    Procedures .Critical Care  Performed by: Chuck Hint, PA-C Authorized by: Chuck Hint, PA-C   Critical care provider statement:    Critical care time (minutes):  30   Critical care time was exclusive of:  Separately billable procedures and treating other patients   Critical care was necessary to treat or prevent imminent or life-threatening deterioration of the following conditions: Pyelonephritis.   Critical care was time spent personally by me on the following activities:  Development of treatment plan with patient or surrogate, discussions with consultants, evaluation of patient's response to treatment, examination of patient, obtaining history from patient or surrogate, review of old charts, re-evaluation of patient's condition, pulse oximetry, ordering and review of radiographic studies, ordering and review of laboratory studies and ordering and performing treatments and interventions   I assumed direction of critical care for this patient from another provider in my specialty: no     Care discussed with: admitting provider       Medications Ordered in ED Medications  cefTRIAXone (ROCEPHIN) 1 g in sodium chloride 0.9 % 100 mL IVPB (has no  administration in time range)  sodium chloride 0.9 % bolus 1,000 mL (0 mLs Intravenous Stopped 11/09/22 1055)  ondansetron (ZOFRAN) injection 4 mg (4 mg Intravenous Given 11/09/22 0810)  oxyCODONE-acetaminophen (PERCOCET/ROXICET) 5-325 MG per tablet 1 tablet (1 tablet Oral Given 11/09/22 0801)  diphenhydrAMINE (BENADRYL) injection 12.5 mg (12.5 mg Intravenous Given 11/09/22 0939)  HYDROmorphone (DILAUDID) injection 0.5 mg (0.5 mg Intravenous Given 11/09/22 1013)    ED Course/ Medical Decision Making/ A&P                             Medical Decision Making Amount and/or Complexity of Data Reviewed Labs: ordered. Radiology: ordered.  Risk Prescription drug management.   Gina [name] 57 y.o. presented today for lower abdominal pain and dysuria. Working DDx that I considered at this time includes, but not limited to, gastroenteritis, colitis, small bowel obstruction, appendicitis, cholecystitis, pancreatitis, nephrolithiasis, UTI, pyelonephritis, electrolyte abnormalities, dehydration  R/o DDx: Electrolyte abnormalities, dehydration, SBO, colitis, gastroenteritis, pancreatitis, appendicitis, cholecystitis: These are considered less likely due to history of present illness and physical exam findings.  Review of prior external notes: 09/18/2022 office visit  Unique Tests and My Interpretation:  CBC with differential: Unremarkable CMP: Unremarkable Lipase: Unremarkable UA: Amounts of blood and leukocytes EKG: Rate, rhythm, axis, intervals all examined and without medically relevant abnormality. ST segments without concerns for elevations CT Renal Study: Moderate left hydronephrosis and hydroureter without obstructing stone, signs of cystitis, signs of possible cellulitis in lower abdominal region  Discussion with Independent Historian: Husband  Discussion of Management of Tests:  Gilford Rile, MD Urology Renaee Munda, MD Hospitalist  Risk: High:  - hospitalization or escalation of hospital-level  care  Risk Stratification Score: None  Plan: Patient presented for abdominal pain. On exam patient was in no acute distress and stable vitals however patient did appear uncomfortable and was tender to palpation in the lower abdominal region without peritoneal signs and had bilateral CVA tenderness.  Due to patient's history of stones and pyelonephritis labs were ordered along with a CT renal stone image to further evaluate for possible kidney infection as patient is  endorsing subjective fevers however she is afebrile here.  Patient will also be given fluids and Zofran for her nausea and possible dehydration and also be given Percocet as she stated she has been able to tolerate that in the past even though there is a documented allergy and the EMR.  Patient will be monitored and is stable at this time.  After receiving the Percocet patient stated she started having pruritus in both hands and so 12.5 mg Benadryl was ordered through the IV for symptom management.  Patient CT came back concerning for possible pyelonephritis and cellulitis.  Patient endorses pain even after the Percocet and so an order of Dilaudid will be ordered for pain management.  Urology will be consulted as I am suspicious for possible pyelonephritis.  Urology was consulted and stated patient does not need any surgical interventions at this time and would be reasonable for admission to the hospitalist.  Hospitalist will be consulted.  Patient still endorsing pain after Dilaudid but does not appear in distress.  Hospitalist accepted patient for admission.  Hospitalist and I spoke about possible antibiotics and due to patient's allergy to penicillins we decided was best to consult pharmacy regarding Rocephin use as patient has documented use of Rocephin in the past.  Pharmacy reviewed patient's chart and determine Rocephin would be safe as patient had penicillin allergy documented 2013 and has had Rocephin in 2021 and 2022 without issue.   Rocephin will be ordered and patient will be admitted.  Patient stable at this time.          Final Clinical Impression(s) / ED Diagnoses Final diagnoses:  Cellulitis of other specified site  Cystitis    Rx / DC Orders ED Discharge Orders     None         Elvina Sidle 11/09/22 1154    Milton Ferguson, MD 11/11/22 1043

## 2022-11-09 NOTE — H&P (Signed)
History and Physical  Gina [name] [id number] DOB: February 17, 1966 DOA: 11/09/2022  PCP: Rhea Bleacher, NP   Chief Complaint: nausea, dysuria, abd pain   HPI: Gina [name] is a 57 y.o. female with medical history significant for morbid obesity, chronic pain, type 2 diabetes, prior nephrolithiasis, GERD and multiple UTIs being admitted to the hospital with pyelonephritis.  Patient tells me she has had symptoms for the last 2 to 3 weeks on and off, of dysuria, lower abdominal pain and some vague nausea.  She started having hematuria yesterday and this feels similar to previous stones.  She is only able to tolerate small amounts of fluid orally.  She also started having some flank pain started on the left, and later on the right as well, over the last few days.  ED Course: Upon presentation to the emergency department, vital signs are normal, lab work is relatively stable, urinalysis consistent with urinary tract infection.  CT scan was also obtained, without obvious stone, but evidence of pyelonephritis as mentioned below.  Patient was given empiric IV Rocephin, and hospitalist was contacted for admission.  Currently she is resting comfortably and has no complaints, other than some back discomfort.  No ER provider also spoke with urology, who recommended medical admission and treatment of infection, no other intervention indicated at this time.  Review of Systems: Please see HPI for pertinent positives and negatives. A complete 10 system review of systems are otherwise negative.  Past Medical History:  Diagnosis Date   Anemia    Anxiety    Arthritis    Asthma    Back pain    Chronic pain    Diabetes mellitus    type 2   Dyspnea    with exersion    Eosinophilia 04/23/2014   Family history of adverse reaction to anesthesia    youngest daughter hard to wake up both daughters post op n/v   Fibromyalgia    GERD (gastroesophageal reflux disease)    Headache    migraines   History of  kidney stones    2 big stones in left kidney now   History of recurrent UTIs    last uti 3 weeks ago   Hyperlipidemia    Hypertension    IBS (irritable bowel syndrome)    Obesities, morbid    Pneumonia 06/2016   PONV (postoperative nausea and vomiting) 2004   came back after hystectomy bowels locked up on life support for 1 month    Renal disorder    Sinusitis    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 at [facility]   Umbilical hernia    Wound discharge    open wound right leg going to wound center changes dressing qday with santyl cream quarter size wound   Past Surgical History:  Procedure Laterality Date   ABDOMINAL HYSTERECTOMY     1 ovary left   CARPAL TUNNEL RELEASE Right    CESAREAN SECTION     x 2   CHOLECYSTECTOMY     COLONOSCOPY WITH PROPOFOL N/A 12/03/2016   Procedure: COLONOSCOPY WITH PROPOFOL;  Surgeon: Doran Stabler, MD;  Location: [facility];  Service: Gastroenterology;  Laterality: N/A;   CYSTECTOMY     left foot   CYSTOSCOPY WITH BIOPSY N/A 12/05/2021   Procedure: CYSTOSCOPY WITH BLADDER BIOPSY/ FULGURATION/ left bladder wall BILATERAL RETROGRADE;  Surgeon: Festus Aloe, MD;  Location: [facility];  Service: Urology;  Laterality: N/A;   CYSTOSCOPY/URETEROSCOPY/HOLMIUM LASER/STENT  PLACEMENT Left 03/18/2020   Procedure: CYSTOSCOPY/LEFT RETROGRADE PYELOGRAM, STENT PLACEMENT;  Surgeon: Ceasar Mons, MD;  Location: [facility];  Service: Urology;  Laterality: Left;   ESOPHAGOGASTRODUODENOSCOPY (EGD) WITH PROPOFOL N/A 12/03/2016   Procedure: ESOPHAGOGASTRODUODENOSCOPY (EGD) WITH PROPOFOL;  Surgeon: Doran Stabler, MD;  Location: [facility];  Service: Gastroenterology;  Laterality: N/A;   LITHOTRIPSY     MULTIPLE TOOTH EXTRACTIONS     URETEROSCOPY WITH HOLMIUM LASER LITHOTRIPSY Left 04/09/2020   Procedure: URETEROSCOPY WITH HOLMIUM LASER LITHOTRIPSY/ STENT CHANGE;  Surgeon: Festus Aloe, MD;  Location: [facility];  Service: Urology;  Laterality: Left;     Social History:  reports that she has been smoking cigarettes. She started smoking about 39 years ago. She has a 49.50 pack-year smoking history. She has never used smokeless tobacco. She reports that she does not drink alcohol and does not use drugs.   Allergies  Allergen Reactions   Bee Venom Anaphylaxis   Darvocet [Propoxyphene N-Acetaminophen] Nausea And Vomiting   Farxiga [Dapagliflozin] Other (See Comments)    Cramping, bleeding, messed bladder up    Fish Allergy Anaphylaxis   Morphine And Related Itching and Other (See Comments)    "I go crazy"   Penicillins Hives, Itching, Swelling and Other (See Comments)    Tolerates Rocephin  Swelling all over body  Has patient had a PCN reaction causing immediate rash, facial/tongue/throat swelling, SOB or lightheadedness with hypotension: Yes Has patient had a PCN reaction causing severe rash involving mucus membranes or skin necrosis: Yes Has patient had a PCN reaction that required hospitalization Unknown Has patient had a PCN reaction occurring within the last 10 years: No  If all of the above answers are "NO", then may proceed with Cephalosporin use.    Tape Hives and Other (See Comments)    Adhesive tape   Fentanyl Nausea Only and Other (See Comments)    "I go crazy"    Family History  Problem Relation Age of Onset   Cancer Mother    Cirrhosis Mother    Heart failure Father    Diabetes Father    Hypertension Father    Alcohol abuse Brother    Colon cancer Neg Hx      Prior to Admission medications   Medication Sig Start Date End Date Taking? Authorizing Provider  acetaminophen (TYLENOL) 500 MG tablet Take 1,000 mg by mouth every 6 (six) hours as needed for mild pain.    [provider]  albuterol (VENTOLIN HFA) 108 (90 Base) MCG/ACT inhaler Inhale 1-2 puffs into the lungs every 6-8 hours as needed. 10/12/22     ALPRAZolam (XANAX) 1 MG tablet Take 1 mg by mouth 2 (two) times daily as needed for anxiety.      [provider]  amitriptyline (ELAVIL) 10 MG tablet Take 10 mg by mouth at bedtime.    [provider]  aspirin EC 81 MG tablet Take 1 tablet (81 mg total) by mouth daily. Swallow whole. 08/29/21   Martinique, Peter M, MD  cyclobenzaprine (FLEXERIL) 10 MG tablet Take 10 mg by mouth daily as needed for muscle spasms. 12/05/20   [provider]  fluticasone (FLONASE) 50 MCG/ACT nasal spray Place 1-2 sprays into both nostrils daily. 06/18/16   [provider]  fluticasone-salmeterol (WIXELA INHUB) 250-50 MCG/ACT AEPB Inhale 1 puff into the lungs 2 (two) times daily. 09/28/22     fluticasone-salmeterol (WIXELA INHUB) 250-50 MCG/ACT AEPB Inhale 1 puff into the lungs 2 (two) times daily. 09/28/22  fluticasone-salmeterol (WIXELA INHUB) 250-50 MCG/ACT AEPB Inhale 1 puff into the lungs 2 (two) times daily. 10/12/22     Fluticasone-Salmeterol (WIXELA INHUB) 250-50 MCG/DOSE AEPB Inhale 1 puff into the lungs 2 (two) times daily. Patient taking differently: Inhale 1 puff into the lungs in the morning. 12/22/18   [place], Betty G, MD  glucose blood (ONE TOUCH ULTRA TEST) test strip USE TO TEST BLOOD SUGAR 3 TIMES DAILY 06/16/18   [place], Betty G, MD  HYDROcodone-acetaminophen (NORCO/VICODIN) 5-325 MG tablet Take 1 tablet by mouth every 6 (six) hours as needed for moderate pain.    [provider]  lisinopril (ZESTRIL) 5 MG tablet Take 5 mg by mouth daily.    [provider]  magnesium oxide (MAG-OX) 400 (240 Mg) MG tablet Take 400 mg by mouth daily. 07/30/21   [provider]  montelukast (SINGULAIR) 10 MG tablet Take 10 mg by mouth at bedtime. 12/06/20   [provider]  MYRBETRIQ 50 MG TB24 tablet Take 50 mg by mouth daily.    [provider]  nystatin (MYCOSTATIN/NYSTOP) powder Apply topically 2 (two) times daily to affected area 10/12/22     nystatin (NYSTATIN) powder Apply topically 3 (three) times daily as needed. Patient taking  differently: Apply 1 application  topically 3 (three) times daily as needed (chaffing/rashes). 01/17/18   [place], Betty G, MD  omeprazole (PRILOSEC) 20 MG capsule TAKE 1 CAPSULE (20 MG TOTAL) BY MOUTH DAILY BEFORE BREAKFAST. 07/03/19   [place], Betty G, MD  OZEMPIC, 2 MG/DOSE, 8 MG/3ML SOPN Inject 2 mg into the skin every Wednesday. 10/23/21   [provider]  pregabalin (LYRICA) 200 MG capsule TAKE 1 CAPSULE BY MOUTH THREE TIMES A DAY Patient taking differently: Take 200 mg by mouth 3 (three) times daily. 01/17/18   [place], Betty G, MD  promethazine (PHENERGAN) 25 MG tablet Take 25 mg by mouth every 8 (eight) hours as needed for nausea or vomiting. 08/08/21   [provider]  rosuvastatin (CRESTOR) 20 MG tablet Take 20 mg by mouth daily. 07/11/21   [provider]  Semaglutide, 2 MG/DOSE, (OZEMPIC, 2 MG/DOSE,) 8 MG/3ML SOPN Inject 2 mg into the skin once a week. 07/13/22     VENTOLIN HFA 108 (90 Base) MCG/ACT inhaler INHALE 2 PUFFS EVERY 6 (SIX) HOURS AS NEEDED INTO THE LUNGS FOR WHEEZING OR SHORTNESS OF BREATH. Patient taking differently: Inhale 2 puffs into the lungs every 4 (four) hours as needed for wheezing or shortness of breath. 06/02/18   [place], Betty G, MD  Vitamin D, Ergocalciferol, (DRISDOL) 1.25 MG (50000 UNIT) CAPS capsule Take 50,000 Units by mouth every Monday.    [provider]    Physical Exam: BP 134/66   Pulse 69   Temp 98 F (36.7 C) (Oral)   Resp 15   Ht 5\' 3"  (1.6 m)   Wt 120.2 kg   SpO2 99%   BMI 46.94 kg/m   General:  Alert, oriented, calm, in no acute distress  Eyes: EOMI, clear conjuctivae, white sclerea Neck: supple, no masses, trachea mildline  Cardiovascular: RRR, no murmurs or rubs, no peripheral edema  Respiratory: clear to auscultation bilaterally, no wheezes, no crackles  Abdomen: soft, nontender, nondistended, normal bowel tones heard  Skin: dry, no rashes, lower abdominal skin with no evidence of  cellulitis Musculoskeletal: no joint effusions, normal range of motion  Psychiatric: appropriate affect, normal speech  Neurologic: extraocular muscles intact, clear speech, moving all extremities with intact sensorium  Labs on Admission:  Basic Metabolic Panel: Recent Labs  Lab 11/09/22 1013  NA 137  K 3.9  CL 113*  CO2 19*  GLUCOSE 180*  BUN 22*  CREATININE 0.94  CALCIUM 7.9*   Liver Function Tests: Recent Labs  Lab 11/09/22 1013  AST 24  ALT 27  ALKPHOS 78  BILITOT 0.2*  PROT 6.1*  ALBUMIN 3.1*   Recent Labs  Lab 11/09/22 1013  LIPASE 37   No results for input(s): "AMMONIA" in the last 168 hours. CBC: Recent Labs  Lab 11/09/22 0811  WBC 5.2  NEUTROABS 3.4  HGB 11.5*  HCT 36.8  MCV 86.8  PLT 225   Cardiac Enzymes: No results for input(s): "CKTOTAL", "CKMB", "CKMBINDEX", "TROPONINI" in the last 168 hours.  BNP (last 3 results) No results for input(s): "BNP" in the last 8760 hours.  ProBNP (last 3 results) No results for input(s): "PROBNP" in the last 8760 hours.  CBG: No results for input(s): "GLUCAP" in the last 168 hours.  Radiological Exams on Admission: CT Renal Stone Study  Result Date: 11/09/2022 CLINICAL DATA:  Left flank pain EXAM: CT ABDOMEN AND PELVIS WITHOUT CONTRAST TECHNIQUE: Multidetector CT imaging of the abdomen and pelvis was performed following the standard protocol without IV contrast. RADIATION DOSE REDUCTION: This exam was performed according to the departmental dose-optimization program which includes automated exposure control, adjustment of the mA and/or kV according to patient size and/or use of iterative reconstruction technique. COMPARISON:  CT renal stone dated March 22nd 2024 FINDINGS: Lower chest: No acute abnormality. Hepatobiliary: No focal liver abnormality is seen. Status post cholecystectomy. No biliary dilatation. Pancreas: Unremarkable. No pancreatic ductal dilatation or surrounding inflammatory changes.  Spleen: Normal in size without focal abnormality. Adrenals/Urinary Tract: Bilateral adrenal glands are unremarkable. Moderate left hydronephrosis hydroureter, unchanged when compared with the prior exam. Severe scarring of the upper pole of the right kidney, unchanged when compared with the prior. Low-attenuation lesion of the anterior left kidney better visualized on prior contrast-enhanced exam and likely simple cyst, no specific follow-up imaging is recommended. Nonobstructing lower pole left renal stone. Bladder wall thickening and perivesicular fat stranding. Stomach/Bowel: Stomach is within normal limits. Appendix appears normal. Mild diverticulosis no evidence of bowel wall thickening, distention, or inflammatory changes. Vascular/Lymphatic: Aortic atherosclerosis. No enlarged abdominal or pelvic lymph nodes. Reproductive: Status post hysterectomy. No adnexal masses. Other: Postsurgical changes of the anterior abdomen. Multiple small fat containing ventral hernias, unchanged when compared with the prior exam. Subcutaneous fat stranding and skin thickening of the lower abdominal wall. Musculoskeletal: No acute or significant osseous findings. IMPRESSION: 1. Moderate left hydronephrosis and hydroureter, unchanged when compared with the prior exam. No ureter stone visualized. Possibly secondary to ascending infection. 2. Bladder wall thickening and perivesicular fat stranding, findings can be seen in the setting of cystitis. Correlate with urinalysis. 3. Subcutaneous fat stranding and skin thickening of the lower abdominal wall, correlate with physical exam for cellulitis. 4. Aortic Atherosclerosis (ICD10-I70.0). Electronically Signed   By: Yetta Glassman M.D.   On: 11/09/2022 09:14    Assessment/Plan Principal Problem:   Pyelonephritis-in the setting of flank pain, nausea, dysuria for the last 2 to 3 weeks.  Patient is not septic, no new hydronephrosis, no stone. -Inpatient admission -Initiate empiric  IV Rocephin -Follow blood and urine cultures, will tailor antibiotic therapy as appropriate  Active Problems:   History of recurrent UTIs   Hyperlipidemia-continue home statin   Type 2 diabetes mellitus with diabetic neuropathy, unspecified-diabetic diet when eating,  and sliding scale   Hypertension-Home medications continued   Chronic back pain   GERD (gastroesophageal reflux disease)-continue oral PPI   Anemia   Tobacco abuse  DVT prophylaxis: Lovenox     Code Status: Full Code  Consults called: None  Admission status: The appropriate patient status for this patient is INPATIENT. Inpatient status is judged to be reasonable and necessary in order to provide the required intensity of service to ensure the patient's safety. The patient's presenting symptoms, physical exam findings, and initial radiographic and laboratory data in the context of their chronic comorbidities is felt to place them at high risk for further clinical deterioration. Furthermore, it is not anticipated that the patient will be medically stable for discharge from the hospital within 2 midnights of admission.    I certify that at the point of admission it is my clinical judgment that the patient will require inpatient hospital care spanning beyond 2 midnights from the point of admission due to high intensity of service, high risk for further deterioration and high frequency of surveillance required   Time spent: 49 minutes  Bexley Laubach Neva Seat MD [facility] Pager [phone]  If 7PM-7AM, please contact night-coverage [email or web address] Password [email or web address]  11/09/2022, 2:45 PM

## 2022-11-10 DIAGNOSIS — N10 Acute tubulo-interstitial nephritis: Secondary | ICD-10-CM

## 2022-11-10 DIAGNOSIS — N12 Tubulo-interstitial nephritis, not specified as acute or chronic: Secondary | ICD-10-CM | POA: Diagnosis not present

## 2022-11-10 DIAGNOSIS — E1140 Type 2 diabetes mellitus with diabetic neuropathy, unspecified: Secondary | ICD-10-CM

## 2022-11-10 DIAGNOSIS — N1330 Unspecified hydronephrosis: Secondary | ICD-10-CM | POA: Diagnosis not present

## 2022-11-10 LAB — CBC
HCT: 36.4 % (ref 36.0–46.0)
Hemoglobin: 11 g/dL — ABNORMAL LOW (ref 12.0–15.0)
MCH: 27 pg (ref 26.0–34.0)
MCHC: 30.2 g/dL (ref 30.0–36.0)
MCV: 89.2 fL (ref 80.0–100.0)
Platelets: 188 10*3/uL (ref 150–400)
RBC: 4.08 MIL/uL (ref 3.87–5.11)
RDW: 16.1 % — ABNORMAL HIGH (ref 11.5–15.5)
WBC: 4.8 10*3/uL (ref 4.0–10.5)
nRBC: 0 % (ref 0.0–0.2)

## 2022-11-10 LAB — BASIC METABOLIC PANEL
Anion gap: 7 (ref 5–15)
BUN: 18 mg/dL (ref 6–20)
CO2: 20 mmol/L — ABNORMAL LOW (ref 22–32)
Calcium: 8.9 mg/dL (ref 8.9–10.3)
Chloride: 111 mmol/L (ref 98–111)
Creatinine, Ser: 0.98 mg/dL (ref 0.44–1.00)
GFR, Estimated: >60 mL/min (ref >60)
Glucose, Bld: 148 mg/dL — ABNORMAL HIGH (ref 70–99)
Potassium: 4.2 mmol/L (ref 3.5–5.1)
Sodium: 138 mmol/L (ref 135–145)

## 2022-11-10 LAB — URINE CULTURE: Culture: <10000 — AB

## 2022-11-10 LAB — HEMOGLOBIN A1C
Hgb A1c MFr Bld: 9.3 % — ABNORMAL HIGH (ref 4.8–5.6)
Mean Plasma Glucose: 220 mg/dL

## 2022-11-10 LAB — GLUCOSE, CAPILLARY
Glucose-Capillary: 145 mg/dL — ABNORMAL HIGH (ref 70–99)
Glucose-Capillary: 167 mg/dL — ABNORMAL HIGH (ref 70–99)
Glucose-Capillary: 142 mg/dL — ABNORMAL HIGH (ref 70–99)
Glucose-Capillary: 171 mg/dL — ABNORMAL HIGH (ref 70–99)

## 2022-11-10 MED ORDER — MOMETASONE FURO-FORMOTEROL FUM 200-5 MCG/ACT IN AERO
2.0000 | INHALATION_SPRAY | Freq: Two times a day (BID) | RESPIRATORY_TRACT | Status: DC
  Administered 2022-11-10 – 2022-11-11 (×2): 2 via RESPIRATORY_TRACT
  Filled 2022-11-10: qty 8.8

## 2022-11-10 MED ORDER — FLUTICASONE PROPIONATE 50 MCG/ACT NA SUSP
1.0000 | Freq: Every day | NASAL | Status: DC
  Administered 2022-11-11: 1 via NASAL
  Filled 2022-11-10: qty 16

## 2022-11-10 MED ORDER — OXYCODONE HCL 5 MG PO TABS
5.0000 mg | ORAL_TABLET | ORAL | Status: DC | PRN
  Administered 2022-11-10 – 2022-11-11 (×5): 10 mg via ORAL
  Filled 2022-11-10 (×5): qty 2

## 2022-11-10 MED ORDER — CYCLOBENZAPRINE HCL 10 MG PO TABS
10.0000 mg | ORAL_TABLET | Freq: Three times a day (TID) | ORAL | Status: DC | PRN
  Administered 2022-11-10: 10 mg via ORAL
  Filled 2022-11-10: qty 1

## 2022-11-10 MED ORDER — MONTELUKAST SODIUM 10 MG PO TABS
10.0000 mg | ORAL_TABLET | Freq: Every day | ORAL | Status: DC
  Administered 2022-11-10: 10 mg via ORAL
  Filled 2022-11-10: qty 1

## 2022-11-10 NOTE — Progress Notes (Signed)
  Progress Note   Patient: Gina [name] [id number] DOB: 10/29/1965 DOA: 11/09/2022     1 DOS: the patient was seen and examined on 11/10/2022   Brief hospital course: 57 year old woman PMH including UTIs, pyelonephritis, interstitial nephritis presented with lower abdominal pain, dysuria, nausea, hematuria.  Admitted for acute pyelonephritis.  Assessment and Plan: Acute pyelonephritis-in the setting of flank pain, nausea, dysuria for the last 2 to 3 weeks.  Patient is not septic, no new hydronephrosis, no stone. Previous UTIs Moderate left hydronephrosis and hydroureter unchanged compared to previous exam.  Radiology.  No ureteral stone visualized.  Possibly secondary to ascending infection. Interstitial cystitis Continue empiric antibiotics.  Urine culture unrevealing.  No blood cultures obtained. Follow-up hydronephrosis and hydroureter as an outpatient.  Renal function stable.   Type 2 diabetes mellitus with diabetic neuropathy, unspecified.  Hemoglobin A1c 9.3. CBG stable.  Continue Lyrica.  Sliding scale insulin.  Hold Ozempic.  Morbid obesity Body mass index is 46.94 kg/m.      Subjective:  Reports bilateral flank pain, dysuria  Physical Exam: Vitals:   11/10/22 0159 11/10/22 0625 11/10/22 1006 11/10/22 1228  BP: (!) 114/57 125/69 (!) 106/46 (!) 105/57  Pulse: (!) 59 65  62  Resp: 16 20  16   Temp: 98.1 F (36.7 C) 98.3 F (36.8 C)  98.1 F (36.7 C)  TempSrc: Oral Oral    SpO2: 98% 99%  99%  Weight:      Height:       Physical Exam Vitals reviewed.  Constitutional:      General: She is not in acute distress.    Appearance: She is not ill-appearing or toxic-appearing.  Cardiovascular:     Rate and Rhythm: Regular rhythm.     Heart sounds: No murmur heard. Pulmonary:     Effort: Pulmonary effort is normal. No respiratory distress.     Breath sounds: No wheezing, rhonchi or rales.  Neurological:     Mental Status: She is alert.  Psychiatric:        Mood  and Affect: Mood normal.        Behavior: Behavior normal.     Data Reviewed: BMP, CBC noted  Family Communication: none present  Disposition: Status is: Inpatient Remains inpatient appropriate because: acute pyelonephritis  Planned Discharge Destination: Home    Time spent: 35 minutes  Author: Murray Hodgkins, MD 11/10/2022 7:13 PM  For on call review [email or web address].

## 2022-11-10 NOTE — TOC Progression Note (Signed)
Transition of Care [facility]) - Progression Note    Patient Details  Name: Gina [name] MRN: [id number] Date of Birth: 1965/10/12  [facility] of [facility] CM/SW Contact  Purcell Mouton, RN Phone Number: 11/10/2022, 12:58 PM  Clinical Narrative:    Spoke with pt concerning [facility]) needs. Pt states that she will not need HH at discharge.    Expected Discharge Plan: Home/Self Care Barriers to Discharge: No Barriers Identified  Expected Discharge Plan and Services       Living arrangements for the past 2 months: Single Family Home                                       Social Determinants of Health (SDOH) Interventions SDOH Screenings   Food Insecurity: No Food Insecurity (11/09/2022)  Housing: Low Risk  (11/09/2022)  Transportation Needs: No Transportation Needs (11/09/2022)  Utilities: Not At Risk (11/09/2022)  Tobacco Use: High Risk (11/09/2022)    Readmission Risk Interventions     No data to display

## 2022-11-10 NOTE — Plan of Care (Signed)
  Problem: Education: Goal: Knowledge of General Education information will improve Description Including pain rating scale, medication(s)/side effects and non-pharmacologic comfort measures Outcome: Progressing   Problem: Health Behavior/Discharge Planning: Goal: Ability to manage health-related needs will improve Outcome: Progressing   

## 2022-11-10 NOTE — Hospital Course (Addendum)
57 year old woman PMH including UTIs, pyelonephritis, interstitial nephritis presented with lower abdominal pain, dysuria, nausea, hematuria.  Admitted for acute pyelonephritis.

## 2022-11-11 DIAGNOSIS — N3011 Interstitial cystitis (chronic) with hematuria: Secondary | ICD-10-CM | POA: Diagnosis not present

## 2022-11-11 LAB — GLUCOSE, CAPILLARY
Glucose-Capillary: 186 mg/dL — ABNORMAL HIGH (ref 70–99)
Glucose-Capillary: 190 mg/dL — ABNORMAL HIGH (ref 70–99)

## 2022-11-11 MED ORDER — ALBUTEROL SULFATE HFA 108 (90 BASE) MCG/ACT IN AERS
2.0000 | INHALATION_SPRAY | RESPIRATORY_TRACT | Status: AC | PRN
Start: 2022-11-11 — End: ?

## 2022-11-11 MED ORDER — PROMETHAZINE HCL 25 MG PO TABS: 25.0000 mg | ORAL_TABLET | Freq: Three times a day (TID) | ORAL | 0 refills | Status: AC | PRN

## 2022-11-11 NOTE — Progress Notes (Signed)
PROGRESS NOTE   Gina [name]  [id number]    DOB: 07/20/1966    DOA: 11/09/2022  PCP: Rhea Bleacher, NP   I have briefly reviewed patients previous medical records in [facility].  Chief Complaint  Patient presents with   Dysuria    Brief Narrative:  57 year old married female with medical history significant for recurrent UTI/pyelonephritis, type II DM, GERD, HLD, HTN, IBS, morbid obesity, anxiety, chronic pain, presented to the ED with 2 to 3 weeks history of on and off dysuria, lower abdominal pain, nausea and 1 day history of hematuria, left > right flank pain, all similar to her previous episodes of kidney stones versus UTI.  UA concerning for UTI and CT abdomen showed evidence of pyelonephritis.  EDP discussed with urology who recommended medical admission and treating infection.  Admitted for acute pyelonephritis.   Assessment & Plan:  Principal Problem:   Acute pyelonephritis Active Problems:   History of recurrent UTIs   Hyperlipidemia   Type 2 diabetes mellitus with diabetic neuropathy, unspecified   Hypertension   Chronic back pain   GERD (gastroesophageal reflux disease)   Anemia   Tobacco abuse   Pyelonephritis   Hydronephrosis   Suspected acute pyelonephritis, in a patient with history of recurrent UTI/pyelonephritis/interstitial cystitis: Patient has a complex history.  Reports following with urology for the last 30+ years.  Reports almost 2-3 episodes of UTI each month but lately has been getting more frequent.  No recent cystoscopy and cannot remember when she last had it done.  Follows with Dr. Junious Silk, [facility] and also "a bladder specialist" someplace else.  History as noted above.  No fever or leukocytosis since admission.  Urine microscopy was cloudy, rare bacteria, >50 RBC/hpf, >50 WBC/hpf.  Unfortunately urine culture was not helpful, insignificant growth.  CT renal stone study showed: Moderate left hydronephrosis and hydroureter,  unchanged when compared with the prior exam. No ureter stone visualized. Possibly secondary to ascending infection. Bladder wall thickening and perivesicular fat stranding, findings can be seen in the setting of cystitis. Subcutaneous fat stranding and skin thickening of the lower abdominal wall, correlate with physical exam for cellulitis (no clinical cellulitis on serial exams).  Multiple urine cultures dating back to 2022 were either normal or insignificant growth and E. coli in August 2021 sensitive to ceftriaxone.  Day 3 IV ceftriaxone today.  Given history of progressively worsening recurrent UTI/pyelonephritis, plan to discuss with Dr. Donnetta Simpers and awaiting response.  May need repeat cystoscopy.  Addendum: Reviewed care everywhere.  Has seen Dr. Chalmers Guest, Urogynecologist and as per office notes from October 2023, diagnosed with interstitial cystitis.  It is highly possible that current presentation is not infectious and likely related to interstitial cystitis.  Chronic moderate left hydronephrosis and hydroureter: Unchanged on renal CT stone study compared to prior.  Outpatient follow-up with urology.  Poorly controlled type II DM with diabetic neuropathy: A1c 9.3.  Holding Ozempic.  Mildly uncontrolled and fluctuating.  Continue SSI.  Continue Lyrica.  Normocytic anemia: Stable.  Body mass index is 46.94 kg/m./Very morbid obesity: Complicates overall care.  Has significant abdominal pannus at risk for recurrent fungal infections.  Currently without cellulitis    DVT prophylaxis: SCDs Start: 11/09/22 1444     Code Status: Full Code:  ACP Documents: None present. Family Communication: Spouse at bedside Disposition:  Status is: Inpatient Remains inpatient appropriate because: Remains symptomatic, on IV antibiotics, pending further discussion with primary urologist.     Consultants:  Procedures:     Antimicrobials:   As above   Subjective:  Overall  feels 60% better.  Lower abdominal discomfort, left-sided flank pain, 7/10 in intensity which was 10+/10 on admission according to patient.  Hematuria clearing up but not resolved.  Reports recurrent hematuria whenever she has a UTI.  Objective:   Vitals:   11/10/22 1228 11/10/22 2055 11/11/22 0509 11/11/22 0844  BP: (!) 105/57 (!) 105/52 (!) 113/56   Pulse: 62 63 63   Resp: 16 20 20    Temp: 98.1 F (36.7 C) 98 F (36.7 C) 98.3 F (36.8 C)   TempSrc:  Oral Oral   SpO2: 99% 100% 100% 100%  Weight:      Height:       Patient was examined along with the female RN as chaperone in the room. General exam: Young female, moderately built and morbidly obese sitting up comfortably in reclining chair and then laying in bed. Respiratory system: Clear to auscultation. Respiratory effort normal. Cardiovascular system: S1 & S2 heard, RRR. No JVD, murmurs, rubs, gallops or clicks. No pedal edema.  Not on telemetry. Gastrointestinal system: Abdomen is nondistended, soft and nontender. No organomegaly or masses felt. Normal bowel sounds heard.  Has large lower abdominal pannus, lifted pannus, no clinical features suggestive of cellulitis but at risk for fungal skin infection. GU:??  Left renal angle tenderness Central nervous system: Alert and oriented. No focal neurological deficits. Extremities: Symmetric 5 x 5 power. Skin: No rashes, lesions or ulcers Psychiatry: Judgement and insight appear normal. Mood & affect appropriate.     Data Reviewed:   I have personally reviewed following labs and imaging studies   CBC: Recent Labs  Lab 11/09/22 0811 11/09/22 1537 11/10/22 0432  WBC 5.2 4.9 4.8  NEUTROABS 3.4  --   --   HGB 11.5* 11.8* 11.0*  HCT 36.8 37.5 36.4  MCV 86.8 86.6 89.2  PLT 225 234 0000000    Basic Metabolic Panel: Recent Labs  Lab 11/09/22 1013 11/09/22 1537 11/10/22 0432  NA 137  --  138  K 3.9  --  4.2  CL 113*  --  111  CO2 19*  --  20*  GLUCOSE 180*  --  148*  BUN  22*  --  18  CREATININE 0.94 1.02* 0.98  CALCIUM 7.9*  --  8.9    Liver Function Tests: Recent Labs  Lab 11/09/22 1013  AST 24  ALT 27  ALKPHOS 78  BILITOT 0.2*  PROT 6.1*  ALBUMIN 3.1*    CBG: Recent Labs  Lab 11/10/22 1746 11/10/22 2052 11/11/22 0808  GLUCAP 142* 171* 186*    Microbiology Studies:   Recent Results (from the past 240 hour(s))  Urine Culture     Status: Abnormal   Collection Time: 11/09/22  7:35 AM   Specimen: Urine, Clean Catch  Result Value Ref Range Status   Specimen Description   Final    URINE, CLEAN CATCH Performed at [facility], [place] [address], [place], [place] [address]    Special Requests   Final    NONE Performed at [facility], [place] [address], [place] [address], [place] [address]    Culture (A)  Final    <10,000 COLONIES/mL INSIGNIFICANT GROWTH Performed at [place] [address], [place], [place] [address]    Report Status 11/10/2022 FINAL  Final    Radiology Studies:  No results found.  Scheduled Meds:    amitriptyline  10  mg Oral QHS   aspirin EC  81 mg Oral Daily   docusate sodium  100 mg Oral BID   enoxaparin (LOVENOX) injection  60 mg Subcutaneous QHS   fluticasone  1-2 spray Each Nare Daily   insulin aspart  0-5 Units Subcutaneous QHS   insulin aspart  0-9 Units Subcutaneous TID WC   lisinopril  5 mg Oral Daily   mirabegron ER  50 mg Oral Daily   mometasone-formoterol  2 puff Inhalation BID   montelukast  10 mg Oral QHS   pantoprazole  40 mg Oral Daily   pregabalin  200 mg Oral TID   rosuvastatin  20 mg Oral Daily    Continuous Infusions:    cefTRIAXone (ROCEPHIN)  IV Stopped (11/10/22 1046)   promethazine (PHENERGAN) injection (IM or IVPB) 6.25 mg (11/11/22 0227)     LOS: 2 days     Vernell Leep, MD,  FACP, [place], [facility], [facility], [facility]   [facility]     To contact the attending provider between 7A-7P or the  covering provider during after hours 7P-7A, please log into the web site [email or web address] and access using universal [place] password for that web site. If you do not have the password, please call the hospital operator.  11/11/2022, 10:50 AM

## 2022-11-11 NOTE — Inpatient Diabetes Management (Signed)
Inpatient Diabetes Program Recommendations  AACE/ADA: New Consensus Statement on Inpatient Glycemic Control (2015)  Target Ranges:  Prepandial:   less than 140 mg/dL      Peak postprandial:   less than 180 mg/dL (1-2 hours)      Critically ill patients:  140 - 180 mg/dL   Lab Results  Component Value Date   GLUCAP 186 (H) 11/11/2022   HGBA1C 9.3 (H) 11/09/2022    Review of Glycemic Control  Diabetes history: DM2 Outpatient Diabetes medications: Ozempic 2 mg weekly Current orders for Inpatient glycemic control: Novolog 0-9 units TID with meals and 0-5 HS  HgbA1C - 9.3% CBGs within goal of 140-180 on 4/2.  Inpatient Diabetes Program Recommendations:    Agree with orders.  Spoke with pt at bedside on 4/2. Pt states she missed taking her Ozempic for approximately 2 months d/t shortage/availability issues. States she now has filled prescription and restarted taking it. Previously on Farxiga, this was discontinued d/t side effects. Pt states she checks blood sugars daily. Discussed HgbA1C of 9.3% and goal of < 8%. Explained how hyperglycemia leads to damage within blood vessels which lead to the common complications seen with uncontrolled diabetes. Stressed to the patient the importance of improving glycemic control to prevent further complications from uncontrolled diabetes. Discussed impact of nutrition, exercise, stress, sickness, and medications on diabetes control.  To f/u with PCP. Patient verbalized understanding of information discussed and reports no further questions at this time related to diabetes.  Continue to follow while inpatient.  Thank you. Lorenda Peck, RD, LDN, [place] Inpatient Diabetes Coordinator [phone]

## 2022-11-11 NOTE — Discharge Instructions (Signed)

## 2022-11-11 NOTE — Discharge Summary (Signed)
Physician Discharge Summary  Gina [name] [id number] DOB: 10/12/1965  PCP: Rhea Bleacher, NP  Admitted from: Home Discharged to: Home  Admit date: 11/09/2022 Discharge date: 11/11/2022  Recommendations for Outpatient Follow-up:    Follow-up Information     Rhea Bleacher, NP. Schedule an appointment as soon as possible for a visit in 1 week(s).   Why: To be seen with repeat labs (CBC & BMP) Contact information: [address] [place] [address] [phone]         Festus Aloe, MD Follow up on 11/12/2022.   Specialty: Urology Why: 9 am.  Keep the prior appointment. Contact information: [place] [address] [phone]         Milford Cage, MD Follow up on 11/22/2022.   Specialty: Urology Why: Keep the prior appointment that you have. Contact information: [address] [place] [address] [place]: None    Equipment/Devices: None    Discharge Condition: Improved and stable.   Code Status: Full Code ACP documents: None present. Diet recommendation:  Discharge Diet Orders (From admission, onward)     Start     Ordered   11/11/22 0000  Diet - low sodium heart healthy        11/11/22 1154   11/11/22 0000  Diet Carb Modified        11/11/22 1154             Discharge Diagnoses:  Principal Problem:   Acute pyelonephritis Active Problems:   History of recurrent UTIs   Hyperlipidemia   Type 2 diabetes mellitus with diabetic neuropathy, unspecified   Hypertension   Chronic back pain   GERD (gastroesophageal reflux disease)   Anemia   Tobacco abuse   Pyelonephritis   Hydronephrosis   Brief Summary: 57 year old married female with medical history significant for recurrent UTI/pyelonephritis, type II DM, GERD, HLD, HTN, IBS, morbid obesity, anxiety, chronic pain, presented to the ED with 2 to 3 weeks history of on and off dysuria, lower abdominal pain,  nausea and 1 day history of hematuria, left > right flank pain, all similar to her previous episodes of kidney stones versus UTI.  UA concerning for UTI and CT abdomen showed evidence of pyelonephritis.  EDP discussed with urology who recommended medical admission and treating infection.  Admitted for acute pyelonephritis.     Assessment & Plan:   Suspected acute pyelonephritis, in a patient with history of recurrent UTI/pyelonephritis/interstitial cystitis: Patient has a complex history.  Reports following with urology for the last 30+ years.  Reports almost 2-3 episodes of UTI each month but lately has been getting more frequent.  No recent cystoscopy and cannot remember when she last had it done.  Follows with Dr. Junious Silk, [facility] and also "a bladder specialist" someplace else.  History as noted above.  No fever or leukocytosis since admission.  Urine microscopy was cloudy, rare bacteria, >50 RBC/hpf, >50 WBC/hpf.  Unfortunately urine culture was not helpful, insignificant growth.  CT renal stone study showed: Moderate left hydronephrosis and hydroureter, unchanged when compared with the prior exam. No ureter stone visualized. Possibly secondary to ascending infection. Bladder wall thickening and perivesicular fat stranding, findings can be seen in the setting of cystitis. Subcutaneous fat stranding and skin thickening of the lower abdominal wall, correlate with physical exam for cellulitis (no clinical cellulitis on  serial exams).  Multiple urine cultures dating back to 2022 were either normal or insignificant growth and E. coli in August 2021 sensitive to ceftriaxone.  Day 3 IV ceftriaxone today.  Given history of progressively worsening recurrent UTI/pyelonephritis, plan to discuss with Dr. Donnetta Simpers unsuccessfully without response.     Addendum: Reviewed care everywhere.  Has seen Dr. Arther Dames, Urogynecologist and as per office notes from October 2023, diagnosed with  interstitial cystitis.  It is highly possible that current presentation is not infectious and likely related to interstitial cystitis.  I discussed the same with Dr. Nevada Crane, Urologist on-call who agrees, recommends no further antibiotics at discharge and close outpatient follow-up with Dr. Junious Silk.  As per patient's report, she has a follow-up appointment with Dr. Junious Silk tomorrow 4/4 at 9 AM and with Dr. Aundra Dubin on 4/14.  Also reviewed with patient regarding the extensive counseling regarding diet, measures to reduce stress, etc.  Patient indicated that she has not recently done the cystoscopy which was mentioned in Dr. Skipper Cliche note   Chronic moderate left hydronephrosis and hydroureter: Unchanged on renal CT stone study compared to prior.  Outpatient follow-up with urology.   Poorly controlled type II DM with diabetic neuropathy: A1c 9.3.  Ozempic was held while hospitalized and treated with SSI.  Continue Ozempic at discharge.  Close outpatient follow-up with PCP for better control.   Normocytic anemia: Stable.   Body mass index is 46.94 kg/m./Very morbid obesity: Complicates overall care.  Has significant abdominal pannus at risk for recurrent fungal infections.  Currently without cellulitis       Consultations: Discussed with Dr. Nevada Crane, Urology on-call on day of discharge.  Procedures: None   Discharge Instructions  Discharge Instructions     Call MD for:   Complete by: As directed    Recurrent or worsening blood in the urine.   Call MD for:  difficulty breathing, headache or visual disturbances   Complete by: As directed    Call MD for:  extreme fatigue   Complete by: As directed    Call MD for:  persistant dizziness or light-headedness   Complete by: As directed    Call MD for:  persistant nausea and vomiting   Complete by: As directed    Call MD for:  severe uncontrolled pain   Complete by: As directed    Call MD for:  temperature >100.4   Complete by: As  directed    Diet - low sodium heart healthy   Complete by: As directed    Diet Carb Modified   Complete by: As directed    Discharge instructions   Complete by: As directed    Total acetaminophen dose from all sources not to exceed 4 g/day.   Increase activity slowly   Complete by: As directed    No wound care   Complete by: As directed         Medication List     STOP taking these medications    montelukast 10 MG tablet Commonly known as: SINGULAIR       TAKE these medications    acetaminophen 500 MG tablet Commonly known as: TYLENOL Take 1,000 mg by mouth every 6 (six) hours as needed for mild pain.   albuterol 108 (90 Base) MCG/ACT inhaler Commonly known as: Ventolin HFA Inhale 2 puffs into the lungs every 4 (four) hours as needed for wheezing or shortness of breath. What changed:  See the new instructions. Another medication with the same name was removed.  Continue taking this medication, and follow the directions you see here.   ALPRAZolam 1 MG tablet Commonly known as: XANAX Take 1 mg by mouth 2 (two) times daily as needed for anxiety.   amitriptyline 10 MG tablet Commonly known as: ELAVIL Take 10 mg by mouth at bedtime.   aspirin EC 81 MG tablet Take 1 tablet (81 mg total) by mouth daily. Swallow whole. What changed: additional instructions   cyclobenzaprine 10 MG tablet Commonly known as: FLEXERIL Take 10 mg by mouth daily as needed for muscle spasms.   fluticasone 50 MCG/ACT nasal spray Commonly known as: FLONASE Place 1-2 sprays into both nostrils daily.   fluticasone-salmeterol 250-50 MCG/ACT Aepb Commonly known as: Wixela Inhub Inhale 1 puff into the lungs 2 (two) times daily. What changed: Another medication with the same name was removed. Continue taking this medication, and follow the directions you see here.   glucose blood test strip Commonly known as: ONE TOUCH ULTRA TEST USE TO TEST BLOOD SUGAR 3 TIMES DAILY    HYDROcodone-acetaminophen 5-325 MG tablet Commonly known as: NORCO/VICODIN Take 1 tablet by mouth every 6 (six) hours as needed for moderate pain.   lisinopril 5 MG tablet Commonly known as: ZESTRIL Take 5 mg by mouth daily.   magnesium oxide 400 (240 Mg) MG tablet Commonly known as: MAG-OX Take 400 mg by mouth daily.   Myrbetriq 50 MG Tb24 tablet Generic drug: mirabegron ER Take 50 mg by mouth daily.   nystatin powder Commonly known as: nystatin Apply topically 3 (three) times daily as needed. What changed:  how much to take reasons to take this Another medication with the same name was removed. Continue taking this medication, and follow the directions you see here.   omeprazole 20 MG capsule Commonly known as: PRILOSEC TAKE 1 CAPSULE (20 MG TOTAL) BY MOUTH DAILY BEFORE BREAKFAST.   Ozempic (2 MG/DOSE) 8 MG/3ML Sopn Generic drug: Semaglutide (2 MG/DOSE) Inject 2 mg into the skin every 7 (seven) days. What changed: Another medication with the same name was removed. Continue taking this medication, and follow the directions you see here.   pregabalin 200 MG capsule Commonly known as: Lyrica TAKE 1 CAPSULE BY MOUTH THREE TIMES A DAY What changed:  how much to take how to take this when to take this additional instructions   promethazine 25 MG tablet Commonly known as: PHENERGAN Take 25 mg by mouth every 8 (eight) hours as needed for nausea or vomiting.   rosuvastatin 20 MG tablet Commonly known as: CRESTOR Take 20 mg by mouth daily.   Vitamin D (Ergocalciferol) 1.25 MG (50000 UNIT) Caps capsule Commonly known as: DRISDOL Take 50,000 Units by mouth every 7 (seven) days.       Allergies  Allergen Reactions   Bee Venom Anaphylaxis   Darvocet [Propoxyphene N-Acetaminophen] Nausea And Vomiting   Farxiga [Dapagliflozin] Other (See Comments)    Cramping, bleeding, messed bladder up    Fish Allergy Anaphylaxis   Morphine And Related Itching and Other (See  Comments)    "I go crazy"   Penicillins Hives, Itching, Swelling and Other (See Comments)    Tolerates Rocephin  Swelling all over body  Has patient had a PCN reaction causing immediate rash, facial/tongue/throat swelling, SOB or lightheadedness with hypotension: Yes Has patient had a PCN reaction causing severe rash involving mucus membranes or skin necrosis: Yes Has patient had a PCN reaction that required hospitalization Unknown Has patient had a PCN reaction occurring within the last 10  years: No  If all of the above answers are "NO", then may proceed with Cephalosporin use.    Tape Hives and Other (See Comments)    Adhesive tape   Fentanyl Nausea Only and Other (See Comments)    "I go crazy"   Topiramate Other (See Comments)    Gi intolerance       Procedures/Studies: CT Renal Stone Study  Result Date: 11/09/2022 CLINICAL DATA:  Left flank pain EXAM: CT ABDOMEN AND PELVIS WITHOUT CONTRAST TECHNIQUE: Multidetector CT imaging of the abdomen and pelvis was performed following the standard protocol without IV contrast. RADIATION DOSE REDUCTION: This exam was performed according to the departmental dose-optimization program which includes automated exposure control, adjustment of the mA and/or kV according to patient size and/or use of iterative reconstruction technique. COMPARISON:  CT renal stone dated March 22nd 2024 FINDINGS: Lower chest: No acute abnormality. Hepatobiliary: No focal liver abnormality is seen. Status post cholecystectomy. No biliary dilatation. Pancreas: Unremarkable. No pancreatic ductal dilatation or surrounding inflammatory changes. Spleen: Normal in size without focal abnormality. Adrenals/Urinary Tract: Bilateral adrenal glands are unremarkable. Moderate left hydronephrosis hydroureter, unchanged when compared with the prior exam. Severe scarring of the upper pole of the right kidney, unchanged when compared with the prior. Low-attenuation lesion of the anterior left  kidney better visualized on prior contrast-enhanced exam and likely simple cyst, no specific follow-up imaging is recommended. Nonobstructing lower pole left renal stone. Bladder wall thickening and perivesicular fat stranding. Stomach/Bowel: Stomach is within normal limits. Appendix appears normal. Mild diverticulosis no evidence of bowel wall thickening, distention, or inflammatory changes. Vascular/Lymphatic: Aortic atherosclerosis. No enlarged abdominal or pelvic lymph nodes. Reproductive: Status post hysterectomy. No adnexal masses. Other: Postsurgical changes of the anterior abdomen. Multiple small fat containing ventral hernias, unchanged when compared with the prior exam. Subcutaneous fat stranding and skin thickening of the lower abdominal wall. Musculoskeletal: No acute or significant osseous findings. IMPRESSION: 1. Moderate left hydronephrosis and hydroureter, unchanged when compared with the prior exam. No ureter stone visualized. Possibly secondary to ascending infection. 2. Bladder wall thickening and perivesicular fat stranding, findings can be seen in the setting of cystitis. Correlate with urinalysis. 3. Subcutaneous fat stranding and skin thickening of the lower abdominal wall, correlate with physical exam for cellulitis. 4. Aortic Atherosclerosis (ICD10-I70.0). Electronically Signed   By: Yetta Glassman M.D.   On: 11/09/2022 09:14      Subjective: Overall feels 60% better. Lower abdominal discomfort, left-sided flank pain, 7/10 in intensity which was 10+/10 on admission according to patient. Hematuria clearing up but not resolved. Reports recurrent hematuria whenever she has a UTI.   Discharge Exam:  Vitals:   11/10/22 1228 11/10/22 2055 11/11/22 0509 11/11/22 0844  BP: (!) 105/57 (!) 105/52 (!) 113/56   Pulse: 62 63 63   Resp: 16 20 20    Temp: 98.1 F (36.7 C) 98 F (36.7 C) 98.3 F (36.8 C)   TempSrc:  Oral Oral   SpO2: 99% 100% 100% 100%  Weight:      Height:         Patient was examined along with the female RN as chaperone in the room. General exam: Young female, moderately built and morbidly obese sitting up comfortably in reclining chair and then laying in bed. Respiratory system: Clear to auscultation. Respiratory effort normal. Cardiovascular system: S1 & S2 heard, RRR. No JVD, murmurs, rubs, gallops or clicks. No pedal edema.  Not on telemetry. Gastrointestinal system: Abdomen is nondistended, soft and nontender. No  organomegaly or masses felt. Normal bowel sounds heard.  Has large lower abdominal pannus, lifted pannus, no clinical features suggestive of cellulitis but at risk for fungal skin infection. GU:??  Left renal angle tenderness Central nervous system: Alert and oriented. No focal neurological deficits. Extremities: Symmetric 5 x 5 power. Skin: No rashes, lesions or ulcers Psychiatry: Judgement and insight appear normal. Mood & affect appropriate.     The results of significant diagnostics from this hospitalization (including imaging, microbiology, ancillary and laboratory) are listed below for reference.     Microbiology: Recent Results (from the past 240 hour(s))  Urine Culture     Status: Abnormal   Collection Time: 11/09/22  7:35 AM   Specimen: Urine, Clean Catch  Result Value Ref Range Status   Specimen Description   Final    URINE, CLEAN CATCH Performed at [facility], [place] [address], [place], [place] [address]    Special Requests   Final    NONE Performed at [facility], [place] [address] [place], [place] [address]    Culture (A)  Final    <10,000 COLONIES/mL INSIGNIFICANT GROWTH Performed at [place] [address] [place], [place] [address]    Report Status 11/10/2022 FINAL  Final     Labs: CBC: Recent Labs  Lab 11/09/22 0811 11/09/22 1537 11/10/22 0432  WBC 5.2 4.9 4.8  NEUTROABS 3.4  --   --   HGB 11.5* 11.8* 11.0*  HCT 36.8 37.5 36.4  MCV 86.8 86.6  89.2  PLT 225 234 0000000    Basic Metabolic Panel: Recent Labs  Lab 11/09/22 1013 11/09/22 1537 11/10/22 0432  NA 137  --  138  K 3.9  --  4.2  CL 113*  --  111  CO2 19*  --  20*  GLUCOSE 180*  --  148*  BUN 22*  --  18  CREATININE 0.94 1.02* 0.98  CALCIUM 7.9*  --  8.9    Liver Function Tests: Recent Labs  Lab 11/09/22 1013  AST 24  ALT 27  ALKPHOS 78  BILITOT 0.2*  PROT 6.1*  ALBUMIN 3.1*    CBG: Recent Labs  Lab 11/10/22 0758 11/10/22 1156 11/10/22 1746 11/10/22 2052 11/11/22 0808  GLUCAP 145* 167* 142* 171* 186*    Hgb A1c Recent Labs    11/09/22 1537  HGBA1C 9.3*     Urinalysis    Component Value Date/Time   COLORURINE YELLOW 11/09/2022 0735   APPEARANCEUR CLOUDY (A) 11/09/2022 0735   LABSPEC 1.013 11/09/2022 0735   PHURINE 5.0 11/09/2022 0735   GLUCOSEU 50 (A) 11/09/2022 0735   GLUCOSEU NEGATIVE 03/19/2017 0827   HGBUR LARGE (A) 11/09/2022 0735   BILIRUBINUR NEGATIVE 11/09/2022 0735   BILIRUBINUR negative 01/17/2018 1227   KETONESUR NEGATIVE 11/09/2022 0735   PROTEINUR 100 (A) 11/09/2022 0735   UROBILINOGEN 0.2 01/17/2018 1227   UROBILINOGEN 0.2 03/19/2017 0827   NITRITE NEGATIVE 11/09/2022 0735   LEUKOCYTESUR LARGE (A) 11/09/2022 0735    Discussed in detail with patient and spouse at bedside.  Updated care and answered all questions.  Time coordinating discharge: 25 minutes  SIGNED:  Vernell Leep, MD,  FACP, [facility], [facility], [facility], [facility]   [facility] [place]     To contact the attending provider between 7A-7P or the covering provider during after hours 7P-7A, please log into the web site [email or web address] and access using universal [place] password for that web site. If you  do not have the password, please call the hospital operator.

## 2022-11-12 DIAGNOSIS — R350 Frequency of micturition: Secondary | ICD-10-CM | POA: Diagnosis not present

## 2022-11-12 DIAGNOSIS — R82998 Other abnormal findings in urine: Secondary | ICD-10-CM | POA: Diagnosis not present

## 2022-11-12 DIAGNOSIS — N130 Hydronephrosis with ureteropelvic junction obstruction: Secondary | ICD-10-CM | POA: Diagnosis not present

## 2022-11-12 DIAGNOSIS — N3020 Other chronic cystitis without hematuria: Secondary | ICD-10-CM | POA: Diagnosis not present

## 2022-11-12 DIAGNOSIS — R310 Gross hematuria: Secondary | ICD-10-CM | POA: Diagnosis not present

## 2022-11-14 DIAGNOSIS — F17210 Nicotine dependence, cigarettes, uncomplicated: Secondary | ICD-10-CM | POA: Diagnosis not present

## 2022-11-14 DIAGNOSIS — N3001 Acute cystitis with hematuria: Secondary | ICD-10-CM | POA: Diagnosis not present

## 2022-11-14 DIAGNOSIS — I10 Essential (primary) hypertension: Secondary | ICD-10-CM | POA: Diagnosis not present

## 2022-11-14 DIAGNOSIS — Z87440 Personal history of urinary (tract) infections: Secondary | ICD-10-CM | POA: Diagnosis not present

## 2022-11-17 ENCOUNTER — Other Ambulatory Visit: Payer: Self-pay

## 2022-11-17 ENCOUNTER — Emergency Department (HOSPITAL_COMMUNITY)
Admission: EM | Admit: 2022-11-17 | Discharge: 2022-11-17 | Disposition: A | Discharge status: Home / Self Care | Payer: 59 | Attending: Emergency Medicine | Admitting: Emergency Medicine

## 2022-11-17 ENCOUNTER — Emergency Department (HOSPITAL_COMMUNITY): Payer: 59

## 2022-11-17 DIAGNOSIS — I10 Essential (primary) hypertension: Secondary | ICD-10-CM | POA: Insufficient documentation

## 2022-11-17 DIAGNOSIS — N390 Urinary tract infection, site not specified: Secondary | ICD-10-CM | POA: Insufficient documentation

## 2022-11-17 DIAGNOSIS — N1330 Unspecified hydronephrosis: Secondary | ICD-10-CM | POA: Diagnosis not present

## 2022-11-17 DIAGNOSIS — Z79899 Other long term (current) drug therapy: Secondary | ICD-10-CM | POA: Diagnosis not present

## 2022-11-17 DIAGNOSIS — Z7982 Long term (current) use of aspirin: Secondary | ICD-10-CM | POA: Insufficient documentation

## 2022-11-17 DIAGNOSIS — E119 Type 2 diabetes mellitus without complications: Secondary | ICD-10-CM | POA: Diagnosis not present

## 2022-11-17 DIAGNOSIS — R1030 Lower abdominal pain, unspecified: Secondary | ICD-10-CM | POA: Diagnosis present

## 2022-11-17 DIAGNOSIS — N2889 Other specified disorders of kidney and ureter: Secondary | ICD-10-CM | POA: Diagnosis not present

## 2022-11-17 LAB — COMPREHENSIVE METABOLIC PANEL
ALT: 19 U/L (ref 0–44)
AST: 22 U/L (ref 15–41)
Albumin: 3.5 g/dL (ref 3.5–5.0)
Alkaline Phosphatase: 78 U/L (ref 38–126)
Anion gap: 9 (ref 5–15)
BUN: 14 mg/dL (ref 6–20)
CO2: 21 mmol/L — ABNORMAL LOW (ref 22–32)
Calcium: 9.3 mg/dL (ref 8.9–10.3)
Chloride: 107 mmol/L (ref 98–111)
Creatinine, Ser: 1.03 mg/dL — ABNORMAL HIGH (ref 0.44–1.00)
GFR, Estimated: >60 mL/min (ref >60)
Glucose, Bld: 120 mg/dL — ABNORMAL HIGH (ref 70–99)
Potassium: 4.1 mmol/L (ref 3.5–5.1)
Sodium: 137 mmol/L (ref 135–145)
Total Bilirubin: 0.5 mg/dL (ref 0.3–1.2)
Total Protein: 7 g/dL (ref 6.5–8.1)

## 2022-11-17 LAB — URINALYSIS, ROUTINE W REFLEX MICROSCOPIC
Bilirubin Urine: NEGATIVE
Glucose, UA: NEGATIVE mg/dL
Ketones, ur: NEGATIVE mg/dL
Nitrite: NEGATIVE
Protein, ur: 30 mg/dL — AB
Specific Gravity, Urine: 1.005 (ref 1.005–1.030)
WBC, UA: >50 WBC/hpf (ref 0–5)
pH: 6 (ref 5.0–8.0)

## 2022-11-17 LAB — CBC WITH DIFFERENTIAL/PLATELET
Abs Immature Granulocytes: 0.02 10*3/uL (ref 0.00–0.07)
Basophils Absolute: 0 10*3/uL (ref 0.0–0.1)
Basophils Relative: 1 %
Eosinophils Absolute: 0.5 10*3/uL (ref 0.0–0.5)
Eosinophils Relative: 10 %
HCT: 35.8 % — ABNORMAL LOW (ref 36.0–46.0)
Hemoglobin: 11.4 g/dL — ABNORMAL LOW (ref 12.0–15.0)
Immature Granulocytes: 0 %
Lymphocytes Relative: 26 %
Lymphs Abs: 1.3 10*3/uL (ref 0.7–4.0)
MCH: 27.2 pg (ref 26.0–34.0)
MCHC: 31.8 g/dL (ref 30.0–36.0)
MCV: 85.4 fL (ref 80.0–100.0)
Monocytes Absolute: 0.3 10*3/uL (ref 0.1–1.0)
Monocytes Relative: 6 %
Neutro Abs: 2.9 10*3/uL (ref 1.7–7.7)
Neutrophils Relative %: 57 %
Platelets: 204 10*3/uL (ref 150–400)
RBC: 4.19 MIL/uL (ref 3.87–5.11)
RDW: 15.5 % (ref 11.5–15.5)
WBC: 5 10*3/uL (ref 4.0–10.5)
nRBC: 0 % (ref 0.0–0.2)

## 2022-11-17 LAB — LIPASE, BLOOD: Lipase: 30 U/L (ref 11–51)

## 2022-11-17 LAB — LACTIC ACID, PLASMA: Lactic Acid, Venous: 1.3 mmol/L (ref 0.5–1.9)

## 2022-11-17 MED ORDER — CEPHALEXIN 500 MG PO CAPS: 500.0000 mg | ORAL_CAPSULE | Freq: Three times a day (TID) | ORAL | 0 refills | Status: DC

## 2022-11-17 MED ORDER — HYDROMORPHONE HCL 1 MG/ML IJ SOLN
0.5000 mg | Freq: Once | INTRAMUSCULAR | Status: AC
  Administered 2022-11-17: 0.5 mg via INTRAVENOUS
  Filled 2022-11-17: qty 1

## 2022-11-17 MED ORDER — ONDANSETRON HCL 4 MG/2ML IJ SOLN
4.0000 mg | Freq: Once | INTRAMUSCULAR | Status: AC
  Administered 2022-11-17: 4 mg via INTRAVENOUS
  Filled 2022-11-17: qty 2

## 2022-11-17 MED ORDER — SODIUM CHLORIDE 0.9 % IV SOLN
1.0000 g | Freq: Once | INTRAVENOUS | Status: AC
  Administered 2022-11-17: 1 g via INTRAVENOUS
  Filled 2022-11-17: qty 10

## 2022-11-17 NOTE — ED Notes (Addendum)
Patient given a beverage for fluid challenge.

## 2022-11-17 NOTE — ED Triage Notes (Signed)
C/o generalized abd pain with intermittent dysuria, and nausea x2-3 weeks Denies hematuria.  Admitted 11/09/22 for pyelonephritis with IV abx and patient reports no improvement since.

## 2022-11-17 NOTE — ED Provider Notes (Signed)
[place] EMERGENCY DEPARTMENT AT [facility] Provider Note   CSN: [id number] Arrival date & time: 11/17/22  3646     History  Chief Complaint  Patient presents with   Abdominal Pain    Gina [name] is a 57 y.o. female.   Patient with medical history significant for recurrent UTI/pyelonephritis, type II DM, GERD, HLD, HTN, IBS, morbid obesity, anxiety, chronic pain presenting with intermittent lower abdominal pain, dysuria, frequency, urgency for the past 3 to 4 days.  She is concern for UTI versus kidney stone.  Was hospitalized last week for same.  No longer on antibiotics.  He describes crampy lower abdominal pain that radiates to her low back.  Pain comes and goes lasting several minutes at a time.  Associated with frequency, urgency and dysuria.  No hematuria.  1 episode of vomiting 2 days ago.  Fever up to 101 2 days ago.  Denies any chest pain or shortness of breath.  Denies any vaginal bleeding or discharge.  Reports this pain feels similar to when she was in the hospital but is worse.  Her urine culture from April 1 showed insignificant growth.  She was referred to follow-up with Dr. Mena Goes of urology as well as her bladder specialist Dr. Jonita Albee.  There is some thoughts of interstitial cystitis during her last hospitalization.  She is concern for recurrent infection and recurrent stone given her fever and vomiting.  The history is provided by the patient.  Abdominal Pain Associated symptoms: dysuria, fatigue, fever, nausea and vomiting   Associated symptoms: no chest pain, no cough, no hematuria and no shortness of breath        Home Medications Prior to Admission medications   Medication Sig Start Date End Date Taking? Authorizing Provider  acetaminophen (TYLENOL) 500 MG tablet Take 1,000 mg by mouth every 6 (six) hours as needed for mild pain.    [provider]  albuterol (VENTOLIN HFA) 108 (90 Base) MCG/ACT inhaler Inhale 2 puffs into the lungs  every 4 (four) hours as needed for wheezing or shortness of breath. 11/11/22   Hongalgi, Maximino Greenland, MD  ALPRAZolam Prudy Feeler) 1 MG tablet Take 1 mg by mouth 2 (two) times daily as needed for anxiety.     [provider]  amitriptyline (ELAVIL) 10 MG tablet Take 10 mg by mouth at bedtime.    [provider]  aspirin EC 81 MG tablet Take 1 tablet (81 mg total) by mouth daily. Swallow whole. Patient taking differently: Take 81 mg by mouth daily. 08/29/21   [place], Peter M, MD  cyclobenzaprine (FLEXERIL) 10 MG tablet Take 10 mg by mouth daily as needed for muscle spasms. 12/05/20   [provider]  fluticasone (FLONASE) 50 MCG/ACT nasal spray Place 1-2 sprays into both nostrils daily. 06/18/16   [provider]  fluticasone-salmeterol (WIXELA INHUB) 250-50 MCG/ACT AEPB Inhale 1 puff into the lungs 2 (two) times daily. 10/12/22     glucose blood (ONE TOUCH ULTRA TEST) test strip USE TO TEST BLOOD SUGAR 3 TIMES DAILY 06/16/18   [place], Betty G, MD  HYDROcodone-acetaminophen (NORCO/VICODIN) 5-325 MG tablet Take 1 tablet by mouth every 6 (six) hours as needed for moderate pain.    [provider]  lisinopril (ZESTRIL) 5 MG tablet Take 5 mg by mouth daily.    [provider]  magnesium oxide (MAG-OX) 400 (240 Mg) MG tablet Take 400 mg by mouth daily. 07/30/21   [provider]  MYRBETRIQ 50 MG  TB24 tablet Take 50 mg by mouth daily.    [provider]  nystatin (NYSTATIN) powder Apply topically 3 (three) times daily as needed. Patient taking differently: Apply 1 application  topically 3 (three) times daily as needed (chaffing/rashes). 01/17/18   [place], Betty G, MD  omeprazole (PRILOSEC) 20 MG capsule TAKE 1 CAPSULE (20 MG TOTAL) BY MOUTH DAILY BEFORE BREAKFAST. 07/03/19   [place], Betty G, MD  OZEMPIC, 2 MG/DOSE, 8 MG/3ML SOPN Inject 2 mg into the skin every 7 (seven) days. 10/23/21   [provider]  pregabalin (LYRICA) 200 MG capsule TAKE 1  CAPSULE BY MOUTH THREE TIMES A DAY Patient taking differently: Take 200 mg by mouth 3 (three) times daily. 01/17/18   [place], Betty G, MD  promethazine (PHENERGAN) 25 MG tablet Take 1 tablet (25 mg total) by mouth every 8 (eight) hours as needed for nausea or vomiting. 11/11/22   Hongalgi, Maximino GreenlandAnand D, MD  rosuvastatin (CRESTOR) 20 MG tablet Take 20 mg by mouth daily. 07/11/21   [provider]  Vitamin D, Ergocalciferol, (DRISDOL) 1.25 MG (50000 UNIT) CAPS capsule Take 50,000 Units by mouth every 7 (seven) days.    [provider]      Allergies    Bee venom, Darvocet [propoxyphene n-acetaminophen], Farxiga [dapagliflozin], Fish allergy, Morphine and related, Penicillins, Tape, Fentanyl, and Topiramate    Review of Systems   Review of Systems  Constitutional:  Positive for activity change, appetite change, fatigue and fever.  HENT:  Negative for congestion and rhinorrhea.   Respiratory:  Negative for cough, chest tightness and shortness of breath.   Cardiovascular:  Negative for chest pain.  Gastrointestinal:  Positive for abdominal pain, nausea and vomiting.  Genitourinary:  Positive for dysuria, frequency and urgency. Negative for hematuria.  Musculoskeletal:  Positive for arthralgias, back pain and myalgias.  Skin:  Negative for rash.  Neurological:  Positive for weakness. Negative for dizziness and headaches.   all other systems are negative except as noted in the HPI and PMH.    Physical Exam Updated Vital Signs BP (!) 158/76 (BP Location: Left Arm)   Pulse 77   Temp 98 F (36.7 C) (Oral)   Resp 18   SpO2 100%  Physical Exam Vitals and nursing note reviewed.  Constitutional:      General: She is not in acute distress.    Appearance: She is well-developed. She is obese.  HENT:     Head: Normocephalic and atraumatic.     Mouth/Throat:     Pharynx: No oropharyngeal exudate.  Eyes:     Conjunctiva/sclera: Conjunctivae normal.     Pupils: Pupils are equal, round,  and reactive to light.  Neck:     Comments: No meningismus. Cardiovascular:     Rate and Rhythm: Normal rate and regular rhythm.     Heart sounds: Normal heart sounds. No murmur heard. Pulmonary:     Effort: Pulmonary effort is normal. No respiratory distress.     Breath sounds: Normal breath sounds.  Abdominal:     Palpations: Abdomen is soft.     Tenderness: There is abdominal tenderness. There is no guarding or rebound.     Comments: Diffuse lower abdominal tenderness  Musculoskeletal:        General: Tenderness present. Normal range of motion.     Cervical back: Normal range of motion and neck supple.     Comments: Paraspinal lumbar tenderness  5/5 strength in bilateral lower extremities. Ankle plantar and dorsiflexion intact. [employer]  toe extension intact bilaterally. +2 DP and PT pulses. Unable to elicit patellar reflexes bilaterally. Normal gait.   Skin:    General: Skin is warm.  Neurological:     Mental Status: She is alert and oriented to person, place, and time.     Cranial Nerves: No cranial nerve deficit.     Motor: No abnormal muscle tone.     Coordination: Coordination normal.     Comments:  5/5 strength throughout. CN 2-12 intact.Equal grip strength.   Psychiatric:        Behavior: Behavior normal.     ED Results / Procedures / Treatments   Labs (all labs ordered are listed, but only abnormal results are displayed) Labs Reviewed  CBC WITH DIFFERENTIAL/PLATELET - Abnormal; Notable for the following components:      Result Value   Hemoglobin 11.4 (*)    HCT 35.8 (*)    All other components within normal limits  COMPREHENSIVE METABOLIC PANEL - Abnormal; Notable for the following components:   CO2 21 (*)    Glucose, Bld 120 (*)    Creatinine, Ser 1.03 (*)    All other components within normal limits  URINALYSIS, ROUTINE W REFLEX MICROSCOPIC - Abnormal; Notable for the following components:   APPearance CLOUDY (*)    Hgb urine dipstick MODERATE (*)     Protein, ur 30 (*)    Leukocytes,Ua LARGE (*)    Bacteria, UA RARE (*)    All other components within normal limits  URINE CULTURE  CULTURE, BLOOD (ROUTINE X 2)  CULTURE, BLOOD (ROUTINE X 2)  LIPASE, BLOOD  LACTIC ACID, PLASMA  LACTIC ACID, PLASMA    EKG None  Radiology CT Renal Stone Study  Result Date: 11/17/2022 CLINICAL DATA:  Generalized abdominal pain with intermittent dysuria and nausea for 2-3 weeks. Patient admitted for pyelonephritis on 11/09/2022 treated with IV antibiotics. Patient reports no improvement. EXAM: CT ABDOMEN AND PELVIS WITHOUT CONTRAST TECHNIQUE: Multidetector CT imaging of the abdomen and pelvis was performed following the standard protocol without IV contrast. RADIATION DOSE REDUCTION: This exam was performed according to the departmental dose-optimization program which includes automated exposure control, adjustment of the mA and/or kV according to patient size and/or use of iterative reconstruction technique. COMPARISON:  11/09/2022. FINDINGS: Lower chest: No acute abnormality. Hepatobiliary: No focal liver abnormality is seen. Status post cholecystectomy. No biliary dilatation. Pancreas: Unremarkable. No pancreatic ductal dilatation or surrounding inflammatory changes. Spleen: Normal in size without focal abnormality. Adrenals/Urinary Tract: Normal adrenal glands. Mild left hydronephrosis. Left ureters normal in course and in caliber. No ureteral stone. 1.7 cm focal hypoattenuating lesion within the anterior aspect of the left kidney, lower pole, similar to the prior CT. Small stones in the lower pole of the left kidney, unchanged. No new left renal masses or lesions or stones. Mild left perinephric stranding. No perinephric fluid collection to suggest an abscess. 1.6 cm exophytic lobulated, relatively low attenuation mass, posterior upper pole the right kidney, unchanged. This is stable from the CT dated 03/02/2021 consistent with a benign process, suspected to be a  cyst. No follow-up recommended. The area of low attenuation in the anterior lower pole the left kidney, as well as the intrarenal stones, are also stable from that exam. No right hydronephrosis. Right ureter normal in course and in caliber. Bladder is decompressed. Stomach/Bowel: Stomach is unremarkable. Small bowel and colon are normal in caliber. No wall thickening or inflammation. Vascular/Lymphatic: Aortic atherosclerosis. No aneurysm. No enlarged lymph nodes. Reproductive: Status post  hysterectomy. No adnexal masses. Other: Anterior abdominal wall postsurgical changes and small fat containing hernias, stable from the prior CT. No ascites. Musculoskeletal: No fracture or acute finding.  No bone lesion. IMPRESSION: 1. Current exam shows mild left hydronephrosis and no dilation of the left ureter, with previous exam showing slightly greater left renal collecting system dilation as well as mild dilation of portions of the left ureter. No other change. No ureteral stone. No evidence of a perinephric abscess. Mild left perinephric stranding is unchanged. 2. Bladder assessment limited by lack of distension. 3. No new abnormalities. Electronically Signed   By: Amie Portland M.D.   On: 11/17/2022 12:57    Procedures Procedures    Medications Ordered in ED Medications - No data to display  ED Course/ Medical Decision Making/ A&P                             Medical Decision Making Amount and/or Complexity of Data Reviewed Labs: ordered. Decision-making details documented in ED Course. Radiology: ordered and independent interpretation performed. Decision-making details documented in ED Course. ECG/medicine tests: ordered and independent interpretation performed. Decision-making details documented in ED Course.  Risk Prescription drug management.   Lower abdominal pain with dysuria, hematuria, nausea, vomiting and fever.  Vital stable, no distress, afebrile on arrival.  Recent hospitalization with  possible pyelonephritis versus interstitial cystitis.  Not currently on antibiotics.  UA is again concerning for infection.  Culture is sent.  Last culture last week at an adequate growth.  Creatinine is at baseline.  No leukocytosis.  CT scan shows stable left-sided hydronephrosis\ No ureteral stone.  Left perinephric stranding is unchanged.  Patient feels improved. Tolerating PO. IV rocephin given while cultures are pending.  Does not appear toxic or septic.   Pain is controlled. Will give course of keflex while cultures are pending.  Followup with PCP. Return to the ED sooner with persistent fever, vomiting, pain, inability to urinate or any other concerns.         Final Clinical Impression(s) / ED Diagnoses Final diagnoses:  Urinary tract infection without hematuria, site unspecified    Rx / DC Orders ED Discharge Orders     None         Kentrell Hallahan, Jeannett Senior, MD 11/17/22 1859

## 2022-11-17 NOTE — Discharge Instructions (Signed)
Take the antibiotics as prescribed for a possible urinary tract infection.  Your CT scan appears to be improved from last week.  Follow-up with your specialist as scheduled.  Return to the ED with worsening pain, fever, vomiting or any concerns.

## 2022-11-18 ENCOUNTER — Other Ambulatory Visit: Payer: Self-pay

## 2022-11-18 ENCOUNTER — Emergency Department (HOSPITAL_COMMUNITY)
Admission: EM | Admit: 2022-11-18 | Discharge: 2022-11-18 | Discharge status: Against Medical Advice | Payer: 59 | Attending: Emergency Medicine | Admitting: Emergency Medicine

## 2022-11-18 DIAGNOSIS — N390 Urinary tract infection, site not specified: Secondary | ICD-10-CM | POA: Insufficient documentation

## 2022-11-18 DIAGNOSIS — R079 Chest pain, unspecified: Secondary | ICD-10-CM | POA: Diagnosis not present

## 2022-11-18 DIAGNOSIS — M549 Dorsalgia, unspecified: Secondary | ICD-10-CM | POA: Insufficient documentation

## 2022-11-18 DIAGNOSIS — R112 Nausea with vomiting, unspecified: Secondary | ICD-10-CM | POA: Insufficient documentation

## 2022-11-18 DIAGNOSIS — Z5321 Procedure and treatment not carried out due to patient leaving prior to being seen by health care provider: Secondary | ICD-10-CM | POA: Diagnosis not present

## 2022-11-18 DIAGNOSIS — R0789 Other chest pain: Secondary | ICD-10-CM | POA: Diagnosis not present

## 2022-11-18 LAB — CBC WITH DIFFERENTIAL/PLATELET
Abs Immature Granulocytes: 0.04 10*3/uL (ref 0.00–0.07)
Basophils Absolute: 0 10*3/uL (ref 0.0–0.1)
Basophils Relative: 0 %
Eosinophils Absolute: 0 10*3/uL (ref 0.0–0.5)
Eosinophils Relative: 0 %
HCT: 39.8 % (ref 36.0–46.0)
Hemoglobin: 12.9 g/dL (ref 12.0–15.0)
Immature Granulocytes: 1 %
Lymphocytes Relative: 8 %
Lymphs Abs: 0.6 10*3/uL — ABNORMAL LOW (ref 0.7–4.0)
MCH: 27 pg (ref 26.0–34.0)
MCHC: 32.4 g/dL (ref 30.0–36.0)
MCV: 83.3 fL (ref 80.0–100.0)
Monocytes Absolute: 0.1 10*3/uL (ref 0.1–1.0)
Monocytes Relative: 1 %
Neutro Abs: 6.2 10*3/uL (ref 1.7–7.7)
Neutrophils Relative %: 90 %
Platelets: 251 10*3/uL (ref 150–400)
RBC: 4.78 MIL/uL (ref 3.87–5.11)
RDW: 15.4 % (ref 11.5–15.5)
WBC: 6.9 10*3/uL (ref 4.0–10.5)
nRBC: 0 % (ref 0.0–0.2)

## 2022-11-18 LAB — COMPREHENSIVE METABOLIC PANEL
ALT: 18 U/L (ref 0–44)
AST: 22 U/L (ref 15–41)
Albumin: 4.1 g/dL (ref 3.5–5.0)
Alkaline Phosphatase: 82 U/L (ref 38–126)
Anion gap: 13 (ref 5–15)
BUN: 11 mg/dL (ref 6–20)
CO2: 19 mmol/L — ABNORMAL LOW (ref 22–32)
Calcium: 9.4 mg/dL (ref 8.9–10.3)
Chloride: 100 mmol/L (ref 98–111)
Creatinine, Ser: 0.89 mg/dL (ref 0.44–1.00)
GFR, Estimated: >60 mL/min (ref >60)
Glucose, Bld: 246 mg/dL — ABNORMAL HIGH (ref 70–99)
Potassium: 3.8 mmol/L (ref 3.5–5.1)
Sodium: 132 mmol/L — ABNORMAL LOW (ref 135–145)
Total Bilirubin: 0.7 mg/dL (ref 0.3–1.2)
Total Protein: 8 g/dL (ref 6.5–8.1)

## 2022-11-18 LAB — URINALYSIS, ROUTINE W REFLEX MICROSCOPIC
Bilirubin Urine: NEGATIVE
Glucose, UA: 50 mg/dL — AB
Ketones, ur: 5 mg/dL — AB
Nitrite: NEGATIVE
Protein, ur: 100 mg/dL — AB
RBC / HPF: >50 RBC/hpf (ref 0–5)
Specific Gravity, Urine: 1.012 (ref 1.005–1.030)
WBC, UA: >50 WBC/hpf (ref 0–5)
pH: 6 (ref 5.0–8.0)

## 2022-11-18 LAB — TROPONIN I (HIGH SENSITIVITY)
Troponin I (High Sensitivity): 17 ng/L (ref <18)
Troponin I (High Sensitivity): 18 ng/L — ABNORMAL HIGH (ref <18)

## 2022-11-18 NOTE — ED Provider Triage Note (Signed)
Emergency Medicine Provider Triage Evaluation Note  Gina [name] , a 57 y.o. female  was evaluated in triage.  Pt complains of nausea x vomiting since last night, diagnosed with a UTI, has been taking her abx but endorses vomiting. Here with worsening pain along her back and now her chest. No fever  Review of Systems  Positive: Nausea, vomiting, chest pain Negative: fever  Physical Exam  BP (!) 165/95 (BP Location: Left Arm)   Pulse 88   Temp 97.8 F (36.6 C) (Oral)   Resp 20   SpO2 96%  Gen:   Awake, no distress   Resp:  Normal effort  MSK:   Moves extremities without difficulty  Other:  No CVA tenderness BL  Medical Decision Making  Medically screening exam initiated at 6:40 PM.  Appropriate orders placed.  Gina [name] was informed that the remainder of the evaluation will be completed by another provider, this initial triage assessment does not replace that evaluation, and the importance of remaining in the ED until their evaluation is complete.     Claude Manges, PA-C 11/18/22 1842

## 2022-11-18 NOTE — ED Triage Notes (Signed)
C/o abd pain radiating into chest and back with n/v.  Pt seen for same yesterday and symptoms returned last night with chest pain.

## 2022-11-18 NOTE — ED Notes (Signed)
Pt ambulatory to bathroom w/o assist 

## 2022-11-19 ENCOUNTER — Emergency Department (HOSPITAL_COMMUNITY): Payer: 59

## 2022-11-19 ENCOUNTER — Emergency Department (HOSPITAL_COMMUNITY)
Admission: EM | Admit: 2022-11-19 | Discharge: 2022-11-19 | Disposition: A | Discharge status: Home / Self Care | Payer: 59 | Attending: Emergency Medicine | Admitting: Emergency Medicine

## 2022-11-19 ENCOUNTER — Encounter (HOSPITAL_COMMUNITY): Payer: Self-pay

## 2022-11-19 DIAGNOSIS — Z7982 Long term (current) use of aspirin: Secondary | ICD-10-CM | POA: Diagnosis not present

## 2022-11-19 DIAGNOSIS — R112 Nausea with vomiting, unspecified: Secondary | ICD-10-CM | POA: Diagnosis not present

## 2022-11-19 DIAGNOSIS — E871 Hypo-osmolality and hyponatremia: Secondary | ICD-10-CM | POA: Insufficient documentation

## 2022-11-19 DIAGNOSIS — R944 Abnormal results of kidney function studies: Secondary | ICD-10-CM | POA: Insufficient documentation

## 2022-11-19 DIAGNOSIS — N3011 Interstitial cystitis (chronic) with hematuria: Secondary | ICD-10-CM | POA: Diagnosis not present

## 2022-11-19 DIAGNOSIS — E119 Type 2 diabetes mellitus without complications: Secondary | ICD-10-CM | POA: Diagnosis not present

## 2022-11-19 DIAGNOSIS — N3010 Interstitial cystitis (chronic) without hematuria: Secondary | ICD-10-CM

## 2022-11-19 DIAGNOSIS — E860 Dehydration: Secondary | ICD-10-CM | POA: Diagnosis not present

## 2022-11-19 DIAGNOSIS — R109 Unspecified abdominal pain: Secondary | ICD-10-CM | POA: Diagnosis not present

## 2022-11-19 DIAGNOSIS — I10 Essential (primary) hypertension: Secondary | ICD-10-CM | POA: Diagnosis not present

## 2022-11-19 DIAGNOSIS — Z79899 Other long term (current) drug therapy: Secondary | ICD-10-CM | POA: Insufficient documentation

## 2022-11-19 DIAGNOSIS — R079 Chest pain, unspecified: Secondary | ICD-10-CM | POA: Diagnosis not present

## 2022-11-19 DIAGNOSIS — N3001 Acute cystitis with hematuria: Secondary | ICD-10-CM | POA: Diagnosis not present

## 2022-11-19 LAB — CBC WITH DIFFERENTIAL/PLATELET
Abs Immature Granulocytes: 0.02 10*3/uL (ref 0.00–0.07)
Basophils Absolute: 0 10*3/uL (ref 0.0–0.1)
Basophils Relative: 0 %
Eosinophils Absolute: 0 10*3/uL (ref 0.0–0.5)
Eosinophils Relative: 0 %
HCT: 39.4 % (ref 36.0–46.0)
Hemoglobin: 12.8 g/dL (ref 12.0–15.0)
Immature Granulocytes: 0 %
Lymphocytes Relative: 8 %
Lymphs Abs: 0.8 10*3/uL (ref 0.7–4.0)
MCH: 27.1 pg (ref 26.0–34.0)
MCHC: 32.5 g/dL (ref 30.0–36.0)
MCV: 83.5 fL (ref 80.0–100.0)
Monocytes Absolute: 0.4 10*3/uL (ref 0.1–1.0)
Monocytes Relative: 4 %
Neutro Abs: 8 10*3/uL — ABNORMAL HIGH (ref 1.7–7.7)
Neutrophils Relative %: 88 %
Platelets: 238 10*3/uL (ref 150–400)
RBC: 4.72 MIL/uL (ref 3.87–5.11)
RDW: 15.6 % — ABNORMAL HIGH (ref 11.5–15.5)
WBC: 9.1 10*3/uL (ref 4.0–10.5)
nRBC: 0 % (ref 0.0–0.2)

## 2022-11-19 LAB — COMPREHENSIVE METABOLIC PANEL
ALT: 19 U/L (ref 0–44)
AST: 25 U/L (ref 15–41)
Albumin: 3.9 g/dL (ref 3.5–5.0)
Alkaline Phosphatase: 77 U/L (ref 38–126)
Anion gap: 12 (ref 5–15)
BUN: 16 mg/dL (ref 6–20)
CO2: 21 mmol/L — ABNORMAL LOW (ref 22–32)
Calcium: 9.4 mg/dL (ref 8.9–10.3)
Chloride: 99 mmol/L (ref 98–111)
Creatinine, Ser: 1.09 mg/dL — ABNORMAL HIGH (ref 0.44–1.00)
GFR, Estimated: 59 mL/min — ABNORMAL LOW (ref >60)
Glucose, Bld: 213 mg/dL — ABNORMAL HIGH (ref 70–99)
Potassium: 3.5 mmol/L (ref 3.5–5.1)
Sodium: 132 mmol/L — ABNORMAL LOW (ref 135–145)
Total Bilirubin: 0.8 mg/dL (ref 0.3–1.2)
Total Protein: 7.7 g/dL (ref 6.5–8.1)

## 2022-11-19 LAB — URINE CULTURE: Culture: 20000 — AB

## 2022-11-19 LAB — TROPONIN I (HIGH SENSITIVITY)
Troponin I (High Sensitivity): 26 ng/L — ABNORMAL HIGH (ref <18)
Troponin I (High Sensitivity): 27 ng/L — ABNORMAL HIGH (ref <18)

## 2022-11-19 LAB — LIPASE, BLOOD: Lipase: 27 U/L (ref 11–51)

## 2022-11-19 MED ORDER — HYDROMORPHONE HCL 1 MG/ML IJ SOLN
1.0000 mg | Freq: Once | INTRAMUSCULAR | Status: AC
  Administered 2022-11-19: 1 mg via INTRAVENOUS
  Filled 2022-11-19: qty 1

## 2022-11-19 MED ORDER — SODIUM CHLORIDE 0.9 % IV BOLUS
1000.0000 mL | Freq: Once | INTRAVENOUS | Status: AC
  Administered 2022-11-19: 1000 mL via INTRAVENOUS

## 2022-11-19 MED ORDER — DROPERIDOL 2.5 MG/ML IJ SOLN
1.2500 mg | Freq: Once | INTRAMUSCULAR | Status: AC
  Administered 2022-11-19: 1.25 mg via INTRAVENOUS
  Filled 2022-11-19: qty 2

## 2022-11-19 MED ORDER — SODIUM CHLORIDE 0.9 % IV SOLN: INTRAVENOUS | Status: DC

## 2022-11-19 NOTE — ED Notes (Signed)
Pt ambulate to bathroom ual steady gait. Given urine cup and educated on need for clean catch urine. Pt verb understanding

## 2022-11-19 NOTE — ED Notes (Signed)
ED Provider at bedside. 

## 2022-11-19 NOTE — ED Triage Notes (Signed)
Pt arrives today c/o nausea and vomiting since yesterday. Pt states 8-9 episodes of emesis in last 24 hours, along with dry heaves. Pt c/o sternal CP as well . Pt has epigastric pain.

## 2022-11-19 NOTE — ED Provider Notes (Signed)
[place] [facility]MENT AT [facility] Provider Note   CSN: [id number] Arrival date & time: 11/19/22  1152     History  Chief Complaint  Patient presents with   Emesis    Gina [name] is a 57 y.o. female.   Emesis    Patient has a history of hypertension, fibromyalgia, irritable bowel syndrome, chronic pain syndrome, morbid obesity, hyperlipidemia, diabetes, kidney stones, reflux, recurrent UTIs.  Patient was recently admitted to the hospital April 1 and discharged on April 3.  Patient was diagnosed with pyelonephritis based on urinalysis and CT scan.  Patient was admitted to the hospital for antibiotics.  Ultimately her urine culture did not show any significant growth.  Patient ultimately felt that symptoms could have been related to her interstitial cystitis.  Patient presents to the ED today with complaints of nausea vomiting abdominal pain and chest pain.  Patient states symptoms started yesterday.  She has had multiple episodes of vomiting last episode was a short time ago.  She denies any diarrhea.  No known fevers.  Patient states she tried to come to the ED last evening but the wait was too long so she left.  Patient did not return to the ED on April 9 and was evaluated then.  Patient did have laboratory test performed yesterday.  She had CBC and metabolic panel troponins.  Glucose was noted to be elevated.  Urinalysis does show large leukocyte esterase greater than 50 white blood cells and red blood cells rare bacteria  Home Medications Prior to Admission medications   Medication Sig Start Date End Date Taking? Authorizing Provider  acetaminophen (TYLENOL) 500 MG tablet Take 1,000 mg by mouth every 6 (six) hours as needed for mild pain.    [provider]  albuterol (VENTOLIN HFA) 108 (90 Base) MCG/ACT inhaler Inhale 2 puffs into the lungs every 4 (four) hours as needed for wheezing or shortness of breath. 11/11/22   Hongalgi, Maximino GreenlandAnand D, MD   ALPRAZolam Prudy Feeler(XANAX) 1 MG tablet Take 1 mg by mouth 2 (two) times daily as needed for anxiety.     [provider]  amitriptyline (ELAVIL) 10 MG tablet Take 10 mg by mouth at bedtime.    [provider]  aspirin EC 81 MG tablet Take 1 tablet (81 mg total) by mouth daily. Swallow whole. Patient taking differently: Take 81 mg by mouth daily. 08/29/21   [place], Peter M, MD  cephALEXin (KEFLEX) 500 MG capsule Take 1 capsule (500 mg total) by mouth 3 (three) times daily. 11/17/22   Rancour, Jeannett SeniorStephen, MD  cyclobenzaprine (FLEXERIL) 10 MG tablet Take 10 mg by mouth daily as needed for muscle spasms. 12/05/20   [provider]  fluticasone (FLONASE) 50 MCG/ACT nasal spray Place 1-2 sprays into both nostrils daily. 06/18/16   [provider]  fluticasone-salmeterol (WIXELA INHUB) 250-50 MCG/ACT AEPB Inhale 1 puff into the lungs 2 (two) times daily. 10/12/22     glucose blood (ONE TOUCH ULTRA TEST) test strip USE TO TEST BLOOD SUGAR 3 TIMES DAILY 06/16/18   [place], Betty G, MD  HYDROcodone-acetaminophen (NORCO/VICODIN) 5-325 MG tablet Take 1 tablet by mouth every 6 (six) hours as needed for moderate pain.    [provider]  lisinopril (ZESTRIL) 5 MG tablet Take 5 mg by mouth daily.    [provider]  magnesium oxide (MAG-OX) 400 (240 Mg) MG tablet Take 400 mg by mouth daily. 07/30/21   [provider]  MYRBETRIQ 50 MG TB24  tablet Take 50 mg by mouth daily.    [provider]  nystatin (NYSTATIN) powder Apply topically 3 (three) times daily as needed. Patient taking differently: Apply 1 application  topically 3 (three) times daily as needed (chaffing/rashes). 01/17/18   [place], Betty G, MD  omeprazole (PRILOSEC) 20 MG capsule TAKE 1 CAPSULE (20 MG TOTAL) BY MOUTH DAILY BEFORE BREAKFAST. 07/03/19   [place], Betty G, MD  OZEMPIC, 2 MG/DOSE, 8 MG/3ML SOPN Inject 2 mg into the skin every 7 (seven) days. 10/23/21   [provider]  pregabalin  (LYRICA) 200 MG capsule TAKE 1 CAPSULE BY MOUTH THREE TIMES A DAY Patient taking differently: Take 200 mg by mouth 3 (three) times daily. 01/17/18   [place], Betty G, MD  promethazine (PHENERGAN) 25 MG tablet Take 1 tablet (25 mg total) by mouth every 8 (eight) hours as needed for nausea or vomiting. 11/11/22   Hongalgi, Maximino Greenland, MD  rosuvastatin (CRESTOR) 20 MG tablet Take 20 mg by mouth daily. 07/11/21   [provider]  Vitamin D, Ergocalciferol, (DRISDOL) 1.25 MG (50000 UNIT) CAPS capsule Take 50,000 Units by mouth every 7 (seven) days.    [provider]      Allergies    Bee venom, Darvocet [propoxyphene n-acetaminophen], Farxiga [dapagliflozin], Fish allergy, Morphine and related, Penicillins, Tape, Fentanyl, and Topiramate    Review of Systems   Review of Systems  Gastrointestinal:  Positive for vomiting.    Physical Exam Updated Vital Signs BP (!) 92/53 (BP Location: Left Arm)   Pulse 78   Temp 98.7 F (37.1 C) (Oral)   Resp 18   Ht 1.6 m (5\' 3" )   Wt 118.8 kg   SpO2 100%   BMI 46.41 kg/m  Physical Exam Vitals and nursing note reviewed.  Constitutional:      General: She is not in acute distress.    Appearance: She is well-developed.  HENT:     Head: Normocephalic and atraumatic.     Right Ear: External ear normal.     Left Ear: External ear normal.  Eyes:     General: No scleral icterus.       Right eye: No discharge.        Left eye: No discharge.     Conjunctiva/sclera: Conjunctivae normal.  Neck:     Trachea: No tracheal deviation.  Cardiovascular:     Rate and Rhythm: Normal rate and regular rhythm.  Pulmonary:     Effort: Pulmonary effort is normal. No respiratory distress.     Breath sounds: Normal breath sounds. No stridor. No wheezing or rales.  Abdominal:     General: Bowel sounds are normal. There is no distension.     Palpations: Abdomen is soft.     Tenderness: There is generalized abdominal tenderness. There is no guarding or  rebound.  Musculoskeletal:        General: No tenderness or deformity.     Cervical back: Neck supple.  Skin:    General: Skin is warm and dry.     Findings: No rash.  Neurological:     General: No focal deficit present.     Mental Status: She is alert.     Cranial Nerves: No cranial nerve deficit, dysarthria or facial asymmetry.     Sensory: No sensory deficit.     Motor: No abnormal muscle tone or seizure activity.     Coordination: Coordination normal.  Psychiatric:        Mood and Affect:  Mood normal.     ED Results / Procedures / Treatments   Labs (all labs ordered are listed, but only abnormal results are displayed) Labs Reviewed  COMPREHENSIVE METABOLIC PANEL - Abnormal; Notable for the following components:      Result Value   Sodium 132 (*)    CO2 21 (*)    Glucose, Bld 213 (*)    Creatinine, Ser 1.09 (*)    GFR, Estimated 59 (*)    All other components within normal limits  CBC WITH DIFFERENTIAL/PLATELET - Abnormal; Notable for the following components:   RDW 15.6 (*)    Neutro Abs 8.0 (*)    All other components within normal limits  TROPONIN I (HIGH SENSITIVITY) - Abnormal; Notable for the following components:   Troponin I (High Sensitivity) 26 (*)    All other components within normal limits  TROPONIN I (HIGH SENSITIVITY) - Abnormal; Notable for the following components:   Troponin I (High Sensitivity) 27 (*)    All other components within normal limits  LIPASE, BLOOD    EKG EKG Interpretation  Date/Time:  Thursday November 19 2022 12:44:15 EDT Ventricular Rate:  82 PR Interval:  179 QRS Duration: 119 QT Interval:  390 QTC Calculation: 456 R Axis:   -48 Text Interpretation: Sinus rhythm Atrial premature complexes Left anterior fascicular block Probable lateral infarct, old Probable anteroseptal infarct, old No significant change since last tracing Confirmed by Linwood Dibbles [id number]) on 11/19/2022 1:32:00 PM  Radiology DG Abdomen Acute W/Chest  Result  Date: 11/19/2022 CLINICAL DATA:  Chest pain, abdominal pain, vomiting EXAM: DG ABDOMEN ACUTE WITH 1 VIEW CHEST COMPARISON:  CT done on 11/17/2022 FINDINGS: Cardiac size is within normal limits. There are no signs of pulmonary edema or focal pulmonary consolidation. There is no pleural effusion or pneumothorax. Bowel gas pattern is nonspecific. There is no pneumoperitoneum. Surgical clips are seen in right upper quadrant. No abnormal masses or calcifications are seen. Degenerative changes are noted in lumbar spine, more so at the L4-L5 level. IMPRESSION: Nonspecific bowel gas pattern.  There is no pneumoperitoneum. There is no focal pulmonary consolidation. Electronically Signed   By: Ernie Avena M.D.   On: 11/19/2022 13:14    Procedures Procedures    Medications Ordered in ED Medications  sodium chloride 0.9 % bolus 1,000 mL (0 mLs Intravenous Stopped 11/19/22 1453)    And  0.9 %  sodium chloride infusion ( Intravenous New Bag/Given 11/19/22 1423)  droperidol (INAPSINE) 2.5 MG/ML injection 1.25 mg (1.25 mg Intravenous Given 11/19/22 1306)  HYDROmorphone (DILAUDID) injection 1 mg (1 mg Intravenous Given 11/19/22 1306)    ED Course/ Medical Decision Making/ A&P Clinical Course as of 11/19/22 1638  Thu Nov 19, 2022  1330 Troponin I (High Sensitivity)(!) Troponin slightly increased compared to previous [JK]  1330 CBC with Diff(!) CBC normal [JK]  1330 Comprehensive metabolic panel(!) Sodium level decreased but similar to previous.  Creatinine level slightly increased. [JK]  1331 Lipase, blood Normal [JK]  1331 Chest x-ray shows nonspecific bowel gas pattern.  No pneumonia.  No pneumoperitoneum [JK]  1618 Troponin I (High Sensitivity)(!) Second troponin remains unchanged.  Doubt cardiac etiology.  Pt feeling better after treatment. [JK]    Clinical Course User Index [JK] Linwood Dibbles, MD                             Medical Decision Making Problems Addressed: Dehydration: acute  illness or injury that  poses a threat to life or bodily functions Interstitial cystitis: acute illness or injury that poses a threat to life or bodily functions Nausea and vomiting, unspecified vomiting type: acute illness or injury that poses a threat to life or bodily functions Urinary tract infection with hematuria, site unspecified: chronic illness or injury with exacerbation, progression, or side effects of treatment  Amount and/or Complexity of Data Reviewed Labs: ordered. Decision-making details documented in ED Course. Radiology: ordered and independent interpretation performed.  Risk Prescription drug management.   Patient presented to ED for dilation of nausea vomiting abdominal pain.  Patient also noticed pain going up into her chest.  She has a complicated history of recurrent urinary tract infections as well as interstitial cystitis.  Patient was recently hospital for similar symptoms.  Patient's labs did show slight increase in her creatinine.  No signs of any leukocytosis.  No signs of pancreatitis or hepatitis.  Patient was treated with IV fluids as well as pain medications and antiemetics.  Her symptoms have improved.  She is not having any recurrent nausea and vomiting and feels much better and ready for discharge.  She did have slight elevated troponins but these were flat.  No EKG changes.  I doubt symptoms related to acute coronary syndrome.  Patient has had slightly elevated troponins in the past and some this may be related to her dehydration and increased creatinine.  Patient is taking antibiotics from her recent hospitalization.  Will have her continue on that regimen.  She does have plans to follow-up with urologist tomorrow.        Final Clinical Impression(s) / ED Diagnoses Final diagnoses:  Nausea and vomiting, unspecified vomiting type  Dehydration  Interstitial cystitis    Rx / DC Orders ED Discharge Orders     None         Linwood Dibbles,  MD 11/19/22 [phone]

## 2022-11-19 NOTE — Discharge Instructions (Signed)
Continue your antibiotics and current medications.  Follow-up with your urologist specialist as planned.  Return to the ED for fever worsening symptoms

## 2022-11-20 ENCOUNTER — Telehealth (HOSPITAL_BASED_OUTPATIENT_CLINIC_OR_DEPARTMENT_OTHER): Payer: Self-pay

## 2022-11-20 ENCOUNTER — Other Ambulatory Visit (HOSPITAL_COMMUNITY): Payer: Self-pay

## 2022-11-20 ENCOUNTER — Telehealth (HOSPITAL_COMMUNITY): Payer: Self-pay | Admitting: Pharmacy Technician

## 2022-11-20 DIAGNOSIS — N3010 Interstitial cystitis (chronic) without hematuria: Secondary | ICD-10-CM | POA: Diagnosis not present

## 2022-11-20 DIAGNOSIS — N3281 Overactive bladder: Secondary | ICD-10-CM | POA: Diagnosis not present

## 2022-11-20 NOTE — Progress Notes (Addendum)
ED Antimicrobial Stewardship Positive Culture Follow Up   Gina [name] is an 57 y.o. female who presented to [facility] on 11/17/2022 with a chief complaint of  Chief Complaint  Patient presents with   Abdominal Pain    Recent Results (from the past 720 hour(s))  Urine Culture     Status: Abnormal   Collection Time: 11/09/22  7:35 AM   Specimen: Urine, Clean Catch  Result Value Ref Range Status   Specimen Description   Final    URINE, CLEAN CATCH Performed at [facility], [address], [place], [place] [address]    Special Requests   Final    NONE Performed at [facility], [address], [place], [place] [address]    Culture (A)  Final    <10,000 COLONIES/mL INSIGNIFICANT GROWTH Performed at [facility], [address], [place], [place] [address]    Report Status 11/10/2022 FINAL  Final  Blood culture (routine x 2)     Status: None (Preliminary result)   Collection Time: 11/17/22 10:40 AM   Specimen: BLOOD  Result Value Ref Range Status   Specimen Description   Final    BLOOD SITE NOT SPECIFIED Performed at [facility], [address], [place], [place] [address]    Special Requests   Final    BOTTLES DRAWN AEROBIC AND ANAEROBIC Blood Culture adequate volume Performed at [facility], [address] [place], [place] [address]    Culture   Final    NO GROWTH 3 DAYS Performed at [facility], [address], [place], [place] [address]    Report Status PENDING  Incomplete  Urine Culture (for pregnant, neutropenic or urologic patients or patients with an indwelling urinary catheter)     Status: Abnormal   Collection Time: 11/17/22 10:44 AM   Specimen: Urine, Clean Catch  Result Value Ref Range Status   Specimen Description   Final    URINE, CLEAN CATCH Performed at [facility], [address], [place], [place] [address]    Special Requests   Final     NONE Performed at [facility], [address] [place], [place] [address]    Culture 20,000 COLONIES/mL ENTEROCOCCUS FAECIUM (A)  Final   Report Status 11/19/2022 FINAL  Final   Organism ID, Bacteria ENTEROCOCCUS FAECIUM (A)  Final      Susceptibility   Enterococcus faecium - MIC*    AMPICILLIN >=32 RESISTANT Resistant     NITROFURANTOIN 64 INTERMEDIATE Intermediate     VANCOMYCIN <=0.5 SENSITIVE Sensitive     * 20,000 COLONIES/mL ENTEROCOCCUS FAECIUM   74 YOF with history of recurrent UTIs/pyelonephritis/interstitial cystitis presented with abdominal pain and patient-reported fever 2 days prior. Patient was recently admitted on 4/1 and urine culture was with insignificant growth. UA with >50 WBC, 0-5 squamous cells, large leukocytes. Patient reported dysuria, urgency, frequency for 3-4 days prior, though there is some concern that these symptoms are due to interstitial cystitis. CT renal stone study without ureteral stone, some small stones in lower pole of left kidney unchanged from study on 4/1. Patient also returned to ED on 4/10 and 4/11 with abdominal pain and nausea/vomiting. Discussed with EDP. Given symptoms, will treat patient.     Treated with cephalexin, organism resistant to prescribed antimicrobial  New antibiotic prescription: Linezolid 600 mg PO BID x 5 days. Stop cephalexin.  ED Provider: Claudette Stapler, PA-C  Larena Sox,  PharmD PGY1 Pharmacy Resident   11/20/2022  8:19 AM  Clinical Pharmacist Monday - Friday phone -  [phone] Saturday - Sunday phone - [phone]

## 2022-11-20 NOTE — TOC Benefit Eligibility Note (Signed)
Patient Product/process development scientist completed.    The patient is currently admitted and upon discharge could be taking linezolid (Zyvox) 600 mg tablets.  The current 5 day co-pay is $0.00.   The patient is insured through [employer] Part D   This test claim was processed through [facility]- copay amounts may vary at other pharmacies due to pharmacy/plan contracts, or as the patient moves through the different stages of their insurance plan.  Roland Earl, CPHT Pharmacy Patient Advocate Specialist [facility] Patient Advocate Team Direct Number: [phone]  Fax: [phone]

## 2022-11-20 NOTE — Telephone Encounter (Signed)
Pharmacy Patient Advocate Encounter  Insurance verification completed.    The patient is insured through [employer] Part D    The patient is currently admitted and ran test claims for the following: linezolid.  Copays and coinsurance results were relayed to Inpatient clinical team.

## 2022-11-20 NOTE — Telephone Encounter (Signed)
Post ED Visit - Positive Culture Follow-up: Unsuccessful Patient Follow-up  Culture assessed and recommendations reviewed by:  []  Enzo Bi, Pharm.D. []  Celedonio Miyamoto, Pharm.D., BCPS AQ-ID []  Garvin Fila, Pharm.D., BCPS []  Georgina Pillion, Pharm.D., BCPS []  [place], [address]D., BCPS, AAHIVP []  Estella Husk, Pharm.D., BCPS, AAHIVP []  Sherlynn Carbon, PharmD []  Pollyann Samples, PharmD, BCPS  Positive urine culture  []  Patient discharged without antimicrobial prescription and treatment is now indicated [x]  Organism is resistant to prescribed ED discharge antimicrobial []  Patient with positive blood cultures  Plan: Stop Cephalexin and start Linezolid 600 mg po BID x 5 days per ED provider Claudette Stapler, PA-C   Unable to contact patient after 3 attempts, letter will be sent to address on file  Sandria Senter 11/20/2022, 3:52 PM

## 2022-11-22 LAB — CULTURE, BLOOD (ROUTINE X 2)
Culture: NO GROWTH
Special Requests: ADEQUATE

## 2022-11-23 DIAGNOSIS — R112 Nausea with vomiting, unspecified: Secondary | ICD-10-CM | POA: Diagnosis not present

## 2022-11-23 DIAGNOSIS — Z79899 Other long term (current) drug therapy: Secondary | ICD-10-CM | POA: Diagnosis not present

## 2022-11-23 DIAGNOSIS — E1140 Type 2 diabetes mellitus with diabetic neuropathy, unspecified: Secondary | ICD-10-CM | POA: Diagnosis not present

## 2022-11-23 DIAGNOSIS — Z09 Encounter for follow-up examination after completed treatment for conditions other than malignant neoplasm: Secondary | ICD-10-CM | POA: Diagnosis not present

## 2022-11-23 DIAGNOSIS — E785 Hyperlipidemia, unspecified: Secondary | ICD-10-CM | POA: Diagnosis not present

## 2022-11-23 DIAGNOSIS — E559 Vitamin D deficiency, unspecified: Secondary | ICD-10-CM | POA: Diagnosis not present

## 2022-11-24 DIAGNOSIS — R9439 Abnormal result of other cardiovascular function study: Secondary | ICD-10-CM | POA: Diagnosis not present

## 2022-11-24 DIAGNOSIS — B952 Enterococcus as the cause of diseases classified elsewhere: Secondary | ICD-10-CM | POA: Diagnosis not present

## 2022-11-24 DIAGNOSIS — J9811 Atelectasis: Secondary | ICD-10-CM | POA: Diagnosis not present

## 2022-11-24 DIAGNOSIS — R0789 Other chest pain: Secondary | ICD-10-CM | POA: Diagnosis not present

## 2022-11-24 DIAGNOSIS — I498 Other specified cardiac arrhythmias: Secondary | ICD-10-CM | POA: Diagnosis not present

## 2022-11-24 DIAGNOSIS — E1122 Type 2 diabetes mellitus with diabetic chronic kidney disease: Secondary | ICD-10-CM | POA: Diagnosis not present

## 2022-11-24 DIAGNOSIS — J45909 Unspecified asthma, uncomplicated: Secondary | ICD-10-CM | POA: Diagnosis not present

## 2022-11-24 DIAGNOSIS — F17200 Nicotine dependence, unspecified, uncomplicated: Secondary | ICD-10-CM | POA: Diagnosis not present

## 2022-11-24 DIAGNOSIS — K219 Gastro-esophageal reflux disease without esophagitis: Secondary | ICD-10-CM | POA: Diagnosis not present

## 2022-11-24 DIAGNOSIS — R079 Chest pain, unspecified: Secondary | ICD-10-CM | POA: Diagnosis not present

## 2022-11-24 DIAGNOSIS — E1165 Type 2 diabetes mellitus with hyperglycemia: Secondary | ICD-10-CM | POA: Diagnosis not present

## 2022-11-24 DIAGNOSIS — M1990 Unspecified osteoarthritis, unspecified site: Secondary | ICD-10-CM | POA: Diagnosis not present

## 2022-11-24 DIAGNOSIS — N1330 Unspecified hydronephrosis: Secondary | ICD-10-CM | POA: Diagnosis not present

## 2022-11-24 DIAGNOSIS — Z1624 Resistance to multiple antibiotics: Secondary | ICD-10-CM | POA: Diagnosis not present

## 2022-11-24 DIAGNOSIS — F32A Depression, unspecified: Secondary | ICD-10-CM | POA: Diagnosis not present

## 2022-11-24 DIAGNOSIS — E871 Hypo-osmolality and hyponatremia: Secondary | ICD-10-CM | POA: Diagnosis not present

## 2022-11-24 DIAGNOSIS — N179 Acute kidney failure, unspecified: Secondary | ICD-10-CM | POA: Diagnosis not present

## 2022-11-24 DIAGNOSIS — G8929 Other chronic pain: Secondary | ICD-10-CM | POA: Diagnosis not present

## 2022-11-24 DIAGNOSIS — F17210 Nicotine dependence, cigarettes, uncomplicated: Secondary | ICD-10-CM | POA: Diagnosis not present

## 2022-11-24 DIAGNOSIS — N182 Chronic kidney disease, stage 2 (mild): Secondary | ICD-10-CM | POA: Diagnosis not present

## 2022-11-24 DIAGNOSIS — R0602 Shortness of breath: Secondary | ICD-10-CM | POA: Diagnosis not present

## 2022-11-24 DIAGNOSIS — N3281 Overactive bladder: Secondary | ICD-10-CM | POA: Diagnosis not present

## 2022-11-24 DIAGNOSIS — M109 Gout, unspecified: Secondary | ICD-10-CM | POA: Diagnosis not present

## 2022-11-24 DIAGNOSIS — E785 Hyperlipidemia, unspecified: Secondary | ICD-10-CM | POA: Diagnosis not present

## 2022-11-24 DIAGNOSIS — D649 Anemia, unspecified: Secondary | ICD-10-CM | POA: Diagnosis not present

## 2022-11-24 DIAGNOSIS — M542 Cervicalgia: Secondary | ICD-10-CM | POA: Diagnosis not present

## 2022-11-24 DIAGNOSIS — R9431 Abnormal electrocardiogram [ECG] [EKG]: Secondary | ICD-10-CM | POA: Diagnosis not present

## 2022-11-24 DIAGNOSIS — N136 Pyonephrosis: Secondary | ICD-10-CM | POA: Diagnosis not present

## 2022-11-24 DIAGNOSIS — I10 Essential (primary) hypertension: Secondary | ICD-10-CM | POA: Diagnosis not present

## 2022-11-24 DIAGNOSIS — I129 Hypertensive chronic kidney disease with stage 1 through stage 4 chronic kidney disease, or unspecified chronic kidney disease: Secondary | ICD-10-CM | POA: Diagnosis not present

## 2022-11-24 DIAGNOSIS — E8729 Other acidosis: Secondary | ICD-10-CM | POA: Diagnosis not present

## 2022-11-24 LAB — LAB REPORT - SCANNED
A1c: 8.3
EGFR: 65.1

## 2022-11-25 ENCOUNTER — Telehealth: Payer: Self-pay | Admitting: *Deleted

## 2022-11-25 DIAGNOSIS — F17200 Nicotine dependence, unspecified, uncomplicated: Secondary | ICD-10-CM

## 2022-11-25 DIAGNOSIS — R9439 Abnormal result of other cardiovascular function study: Secondary | ICD-10-CM

## 2022-11-25 DIAGNOSIS — N179 Acute kidney failure, unspecified: Secondary | ICD-10-CM

## 2022-11-25 DIAGNOSIS — I10 Essential (primary) hypertension: Secondary | ICD-10-CM

## 2022-11-25 DIAGNOSIS — E785 Hyperlipidemia, unspecified: Secondary | ICD-10-CM

## 2022-11-25 DIAGNOSIS — R079 Chest pain, unspecified: Secondary | ICD-10-CM

## 2022-11-25 NOTE — Telephone Encounter (Signed)
     Patient  visit on 11/19/2022  at [facility] long  was for treatment  Have you been able to follow up with your primary care physician? Patient is now in [place] hospital The patient was able to obtain any needed medicine or equipment.  Are there diet recommendations that you are having difficulty following?  Patient expresses understanding of discharge instructions and education provided has no other needs at this time.    Yehuda Mao Greenauer -[facility], Population Health [phone] [address] [place] , [place] [address] Email : [email or web address]

## 2022-11-26 DIAGNOSIS — R9439 Abnormal result of other cardiovascular function study: Secondary | ICD-10-CM | POA: Diagnosis not present

## 2022-11-26 DIAGNOSIS — E785 Hyperlipidemia, unspecified: Secondary | ICD-10-CM | POA: Diagnosis not present

## 2022-11-26 DIAGNOSIS — N179 Acute kidney failure, unspecified: Secondary | ICD-10-CM | POA: Diagnosis not present

## 2022-11-26 DIAGNOSIS — I10 Essential (primary) hypertension: Secondary | ICD-10-CM | POA: Diagnosis not present

## 2022-11-27 DIAGNOSIS — E785 Hyperlipidemia, unspecified: Secondary | ICD-10-CM | POA: Diagnosis not present

## 2022-11-27 DIAGNOSIS — I10 Essential (primary) hypertension: Secondary | ICD-10-CM | POA: Diagnosis not present

## 2022-11-27 DIAGNOSIS — N179 Acute kidney failure, unspecified: Secondary | ICD-10-CM | POA: Diagnosis not present

## 2022-11-27 DIAGNOSIS — R9439 Abnormal result of other cardiovascular function study: Secondary | ICD-10-CM | POA: Diagnosis not present

## 2022-11-30 DIAGNOSIS — N179 Acute kidney failure, unspecified: Secondary | ICD-10-CM | POA: Diagnosis not present

## 2022-11-30 DIAGNOSIS — K429 Umbilical hernia without obstruction or gangrene: Secondary | ICD-10-CM | POA: Diagnosis not present

## 2022-11-30 DIAGNOSIS — N390 Urinary tract infection, site not specified: Secondary | ICD-10-CM | POA: Diagnosis not present

## 2022-11-30 DIAGNOSIS — E872 Acidosis, unspecified: Secondary | ICD-10-CM | POA: Diagnosis not present

## 2022-11-30 DIAGNOSIS — N3010 Interstitial cystitis (chronic) without hematuria: Secondary | ICD-10-CM | POA: Diagnosis not present

## 2022-11-30 DIAGNOSIS — E785 Hyperlipidemia, unspecified: Secondary | ICD-10-CM | POA: Diagnosis not present

## 2022-11-30 DIAGNOSIS — E1165 Type 2 diabetes mellitus with hyperglycemia: Secondary | ICD-10-CM | POA: Diagnosis not present

## 2022-11-30 DIAGNOSIS — Z4659 Encounter for fitting and adjustment of other gastrointestinal appliance and device: Secondary | ICD-10-CM | POA: Diagnosis not present

## 2022-11-30 DIAGNOSIS — R109 Unspecified abdominal pain: Secondary | ICD-10-CM | POA: Diagnosis not present

## 2022-11-30 DIAGNOSIS — R112 Nausea with vomiting, unspecified: Secondary | ICD-10-CM | POA: Diagnosis not present

## 2022-11-30 DIAGNOSIS — K56600 Partial intestinal obstruction, unspecified as to cause: Secondary | ICD-10-CM | POA: Diagnosis not present

## 2022-11-30 DIAGNOSIS — F17210 Nicotine dependence, cigarettes, uncomplicated: Secondary | ICD-10-CM | POA: Diagnosis not present

## 2022-11-30 DIAGNOSIS — K219 Gastro-esophageal reflux disease without esophagitis: Secondary | ICD-10-CM | POA: Diagnosis not present

## 2022-11-30 DIAGNOSIS — D649 Anemia, unspecified: Secondary | ICD-10-CM | POA: Diagnosis not present

## 2022-11-30 DIAGNOSIS — I10 Essential (primary) hypertension: Secondary | ICD-10-CM | POA: Diagnosis not present

## 2022-11-30 DIAGNOSIS — G8929 Other chronic pain: Secondary | ICD-10-CM | POA: Diagnosis not present

## 2022-11-30 DIAGNOSIS — J45909 Unspecified asthma, uncomplicated: Secondary | ICD-10-CM | POA: Diagnosis not present

## 2022-11-30 DIAGNOSIS — D696 Thrombocytopenia, unspecified: Secondary | ICD-10-CM | POA: Diagnosis not present

## 2022-11-30 DIAGNOSIS — Z79899 Other long term (current) drug therapy: Secondary | ICD-10-CM | POA: Diagnosis not present

## 2022-11-30 DIAGNOSIS — E878 Other disorders of electrolyte and fluid balance, not elsewhere classified: Secondary | ICD-10-CM | POA: Diagnosis not present

## 2022-11-30 DIAGNOSIS — K5669 Other partial intestinal obstruction: Secondary | ICD-10-CM | POA: Diagnosis not present

## 2022-11-30 DIAGNOSIS — E1140 Type 2 diabetes mellitus with diabetic neuropathy, unspecified: Secondary | ICD-10-CM | POA: Diagnosis not present

## 2022-11-30 DIAGNOSIS — N3000 Acute cystitis without hematuria: Secondary | ICD-10-CM | POA: Diagnosis not present

## 2022-11-30 DIAGNOSIS — N1330 Unspecified hydronephrosis: Secondary | ICD-10-CM | POA: Diagnosis not present

## 2022-11-30 DIAGNOSIS — N136 Pyonephrosis: Secondary | ICD-10-CM | POA: Diagnosis not present

## 2022-11-30 DIAGNOSIS — Z7951 Long term (current) use of inhaled steroids: Secondary | ICD-10-CM | POA: Diagnosis not present

## 2022-11-30 DIAGNOSIS — N12 Tubulo-interstitial nephritis, not specified as acute or chronic: Secondary | ICD-10-CM | POA: Diagnosis not present

## 2022-11-30 DIAGNOSIS — K6389 Other specified diseases of intestine: Secondary | ICD-10-CM | POA: Diagnosis not present

## 2022-12-29 DIAGNOSIS — E119 Type 2 diabetes mellitus without complications: Secondary | ICD-10-CM | POA: Diagnosis not present

## 2022-12-29 DIAGNOSIS — Z1231 Encounter for screening mammogram for malignant neoplasm of breast: Secondary | ICD-10-CM | POA: Diagnosis not present

## 2022-12-29 DIAGNOSIS — Z1211 Encounter for screening for malignant neoplasm of colon: Secondary | ICD-10-CM | POA: Diagnosis not present

## 2022-12-29 DIAGNOSIS — N390 Urinary tract infection, site not specified: Secondary | ICD-10-CM | POA: Diagnosis not present

## 2023-01-01 DIAGNOSIS — Z1231 Encounter for screening mammogram for malignant neoplasm of breast: Secondary | ICD-10-CM | POA: Diagnosis not present

## 2023-01-26 DIAGNOSIS — Z79899 Other long term (current) drug therapy: Secondary | ICD-10-CM | POA: Diagnosis not present

## 2023-01-26 DIAGNOSIS — R0602 Shortness of breath: Secondary | ICD-10-CM | POA: Diagnosis not present

## 2023-01-26 DIAGNOSIS — M5442 Lumbago with sciatica, left side: Secondary | ICD-10-CM | POA: Diagnosis not present

## 2023-01-26 DIAGNOSIS — E119 Type 2 diabetes mellitus without complications: Secondary | ICD-10-CM | POA: Diagnosis not present

## 2023-01-26 DIAGNOSIS — Z0000 Encounter for general adult medical examination without abnormal findings: Secondary | ICD-10-CM | POA: Diagnosis not present

## 2023-01-26 DIAGNOSIS — Z780 Asymptomatic menopausal state: Secondary | ICD-10-CM | POA: Diagnosis not present

## 2023-01-26 DIAGNOSIS — Z87891 Personal history of nicotine dependence: Secondary | ICD-10-CM | POA: Diagnosis not present

## 2023-01-26 DIAGNOSIS — F17210 Nicotine dependence, cigarettes, uncomplicated: Secondary | ICD-10-CM | POA: Diagnosis not present

## 2023-02-04 DIAGNOSIS — Z87891 Personal history of nicotine dependence: Secondary | ICD-10-CM | POA: Diagnosis not present

## 2023-02-08 DIAGNOSIS — Z79899 Other long term (current) drug therapy: Secondary | ICD-10-CM | POA: Diagnosis not present

## 2023-02-08 DIAGNOSIS — M25511 Pain in right shoulder: Secondary | ICD-10-CM | POA: Diagnosis not present

## 2023-02-08 DIAGNOSIS — G8929 Other chronic pain: Secondary | ICD-10-CM | POA: Diagnosis not present

## 2023-02-08 DIAGNOSIS — M545 Low back pain, unspecified: Secondary | ICD-10-CM | POA: Diagnosis not present

## 2023-02-24 DIAGNOSIS — N390 Urinary tract infection, site not specified: Secondary | ICD-10-CM | POA: Diagnosis not present

## 2023-02-24 DIAGNOSIS — E119 Type 2 diabetes mellitus without complications: Secondary | ICD-10-CM | POA: Diagnosis not present

## 2023-02-24 DIAGNOSIS — K219 Gastro-esophageal reflux disease without esophagitis: Secondary | ICD-10-CM | POA: Diagnosis not present

## 2023-02-24 DIAGNOSIS — R600 Localized edema: Secondary | ICD-10-CM | POA: Diagnosis not present

## 2023-03-10 DIAGNOSIS — G8929 Other chronic pain: Secondary | ICD-10-CM | POA: Diagnosis not present

## 2023-03-10 DIAGNOSIS — M545 Low back pain, unspecified: Secondary | ICD-10-CM | POA: Diagnosis not present

## 2023-03-10 DIAGNOSIS — F17210 Nicotine dependence, cigarettes, uncomplicated: Secondary | ICD-10-CM | POA: Diagnosis not present

## 2023-03-10 DIAGNOSIS — Z79899 Other long term (current) drug therapy: Secondary | ICD-10-CM | POA: Diagnosis not present

## 2023-03-10 DIAGNOSIS — M25511 Pain in right shoulder: Secondary | ICD-10-CM | POA: Diagnosis not present

## 2023-03-10 DIAGNOSIS — T07XXXA Unspecified multiple injuries, initial encounter: Secondary | ICD-10-CM | POA: Diagnosis not present

## 2023-04-01 DIAGNOSIS — Z86010 Personal history of colonic polyps: Secondary | ICD-10-CM | POA: Diagnosis not present

## 2023-04-01 DIAGNOSIS — Z1211 Encounter for screening for malignant neoplasm of colon: Secondary | ICD-10-CM | POA: Diagnosis not present

## 2023-04-01 DIAGNOSIS — E119 Type 2 diabetes mellitus without complications: Secondary | ICD-10-CM | POA: Diagnosis not present

## 2023-04-05 DIAGNOSIS — R310 Gross hematuria: Secondary | ICD-10-CM | POA: Diagnosis not present

## 2023-04-05 DIAGNOSIS — R8289 Other abnormal findings on cytological and histological examination of urine: Secondary | ICD-10-CM | POA: Diagnosis not present

## 2023-04-16 ENCOUNTER — Inpatient Hospital Stay: Admission: RE | Admit: 2023-04-16 | Payer: 59 | Source: Ambulatory Visit

## 2023-04-17 DIAGNOSIS — M25511 Pain in right shoulder: Secondary | ICD-10-CM | POA: Diagnosis not present

## 2023-04-17 DIAGNOSIS — M545 Low back pain, unspecified: Secondary | ICD-10-CM | POA: Diagnosis not present

## 2023-04-17 DIAGNOSIS — G8929 Other chronic pain: Secondary | ICD-10-CM | POA: Diagnosis not present

## 2023-04-17 DIAGNOSIS — Z79899 Other long term (current) drug therapy: Secondary | ICD-10-CM | POA: Diagnosis not present

## 2023-04-17 DIAGNOSIS — F17210 Nicotine dependence, cigarettes, uncomplicated: Secondary | ICD-10-CM | POA: Diagnosis not present

## 2023-04-29 DIAGNOSIS — N3000 Acute cystitis without hematuria: Secondary | ICD-10-CM | POA: Diagnosis not present

## 2023-04-30 IMAGING — CT CT CHEST LUNG CANCER SCREENING LOW DOSE W/O CM
1 series · 14 of 31 positions shown, 18 images · non-contrast
Comparison: No priors.

CLINICAL DATA: 54-year-old female current smoker with 32 pack-year
history of smoking. Lung cancer screening examination.

EXAM:
CT CHEST WITHOUT CONTRAST LOW-DOSE FOR LUNG CANCER SCREENING
TECHNIQUE: Multidetector CT imaging of the chest was performed following the
standard protocol without IV contrast.

[Series 2: ldct screen -soft · axial · 0.67mm/px · z∈[-340,-70]mm · 14 of 64 slices shown, 18 images]
[im 5/64  mediastinal]
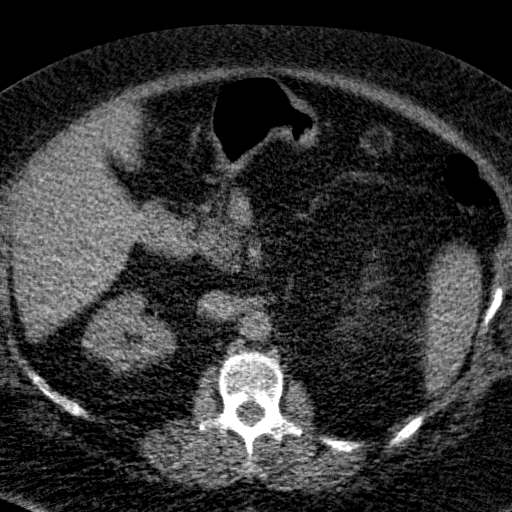
[im 5/64  lung]
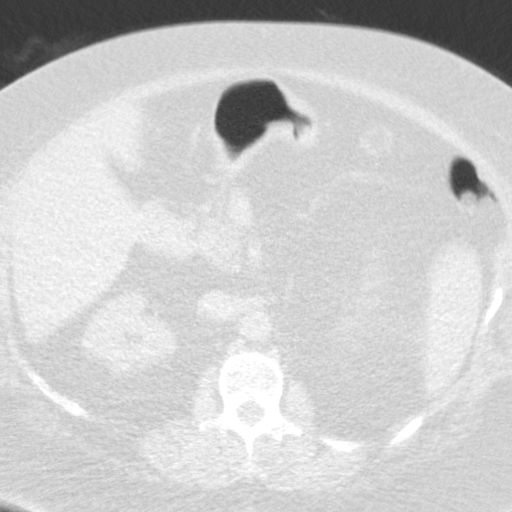
[im 10/64  lung]
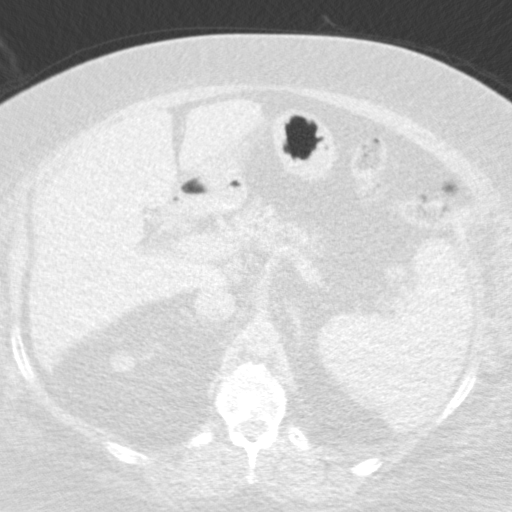
[im 13/64  lung]
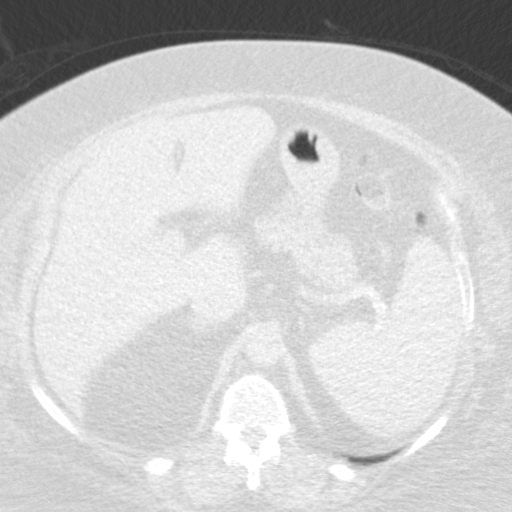
[im 17/64  lung]
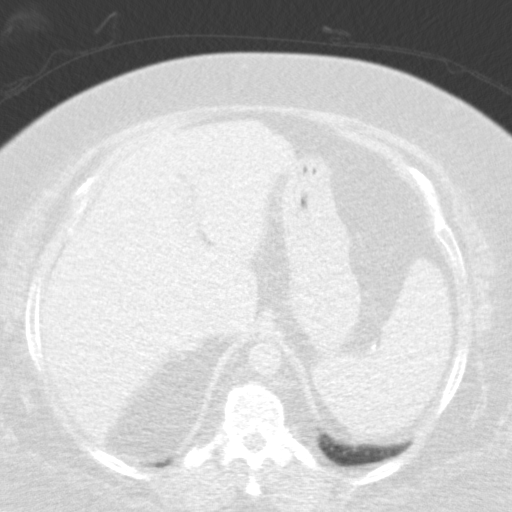
[im 22/64  mediastinal]
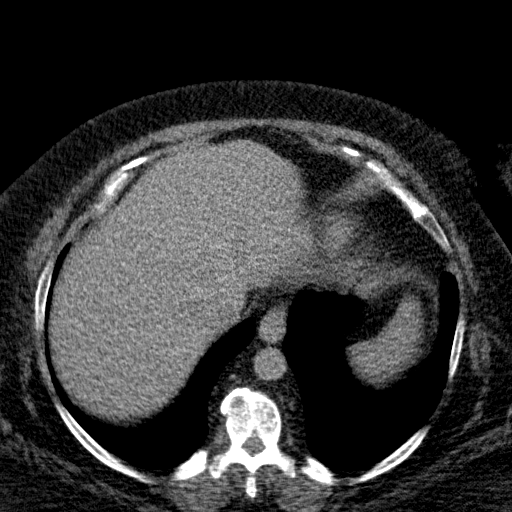
[im 22/64  lung]
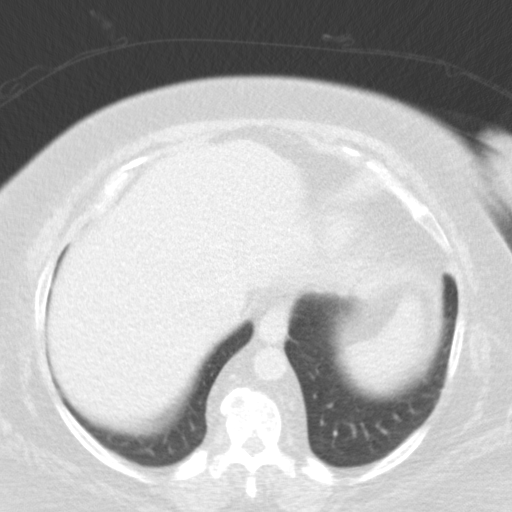
[im 26/64  lung]
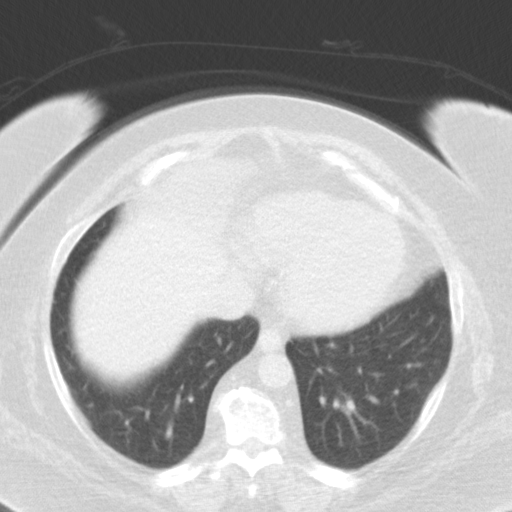
[im 31/64  lung]
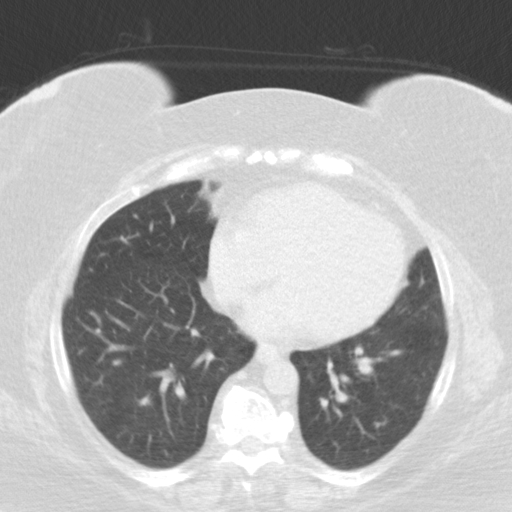
[im 34/64  lung]
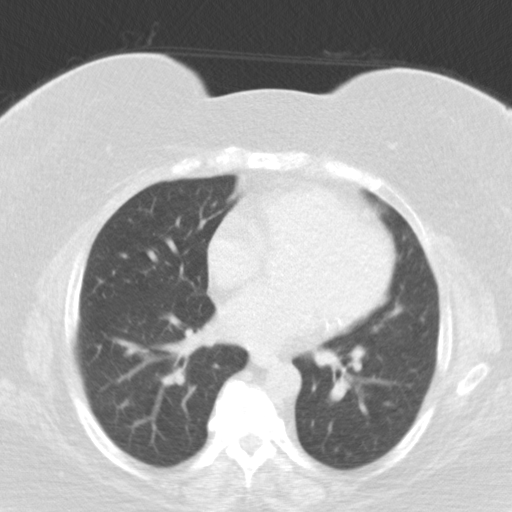
[im 38/64  mediastinal]
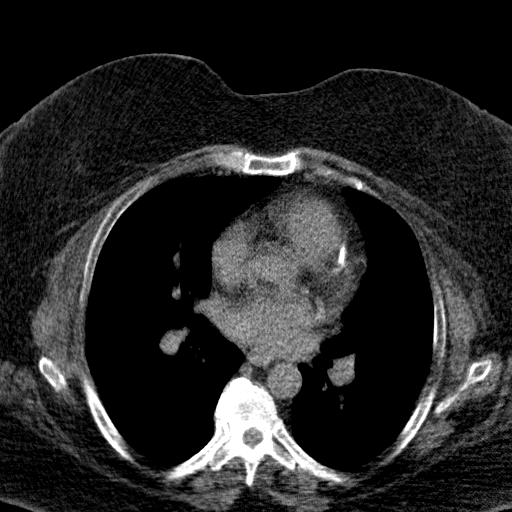
[im 38/64  lung]
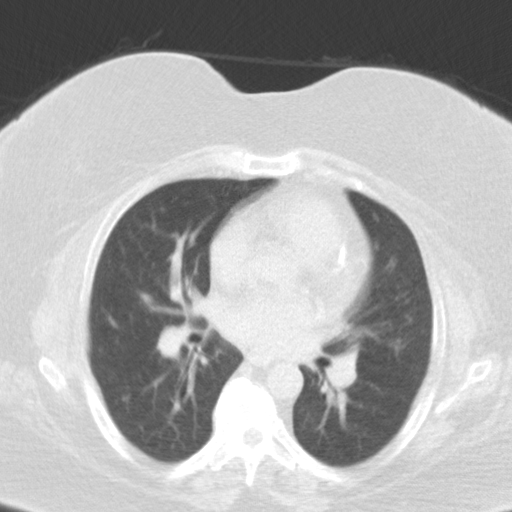
[im 43/64  lung]
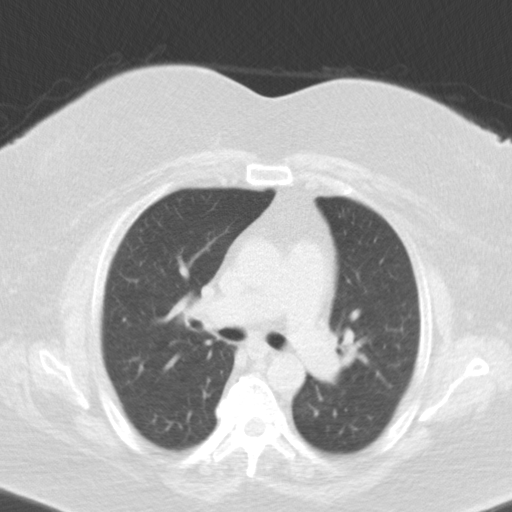
[im 47/64  lung]
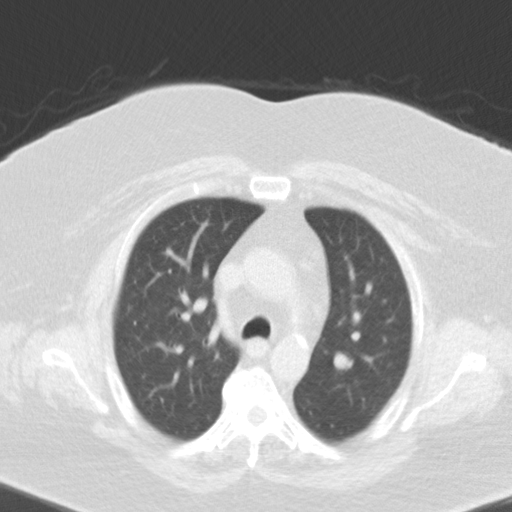
[im 51/64  lung]
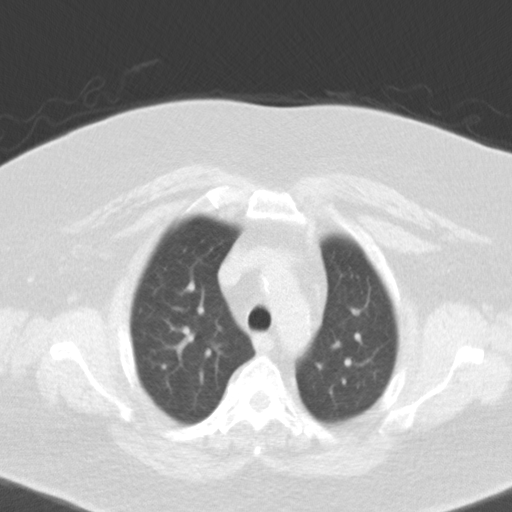
[im 54/64  mediastinal]
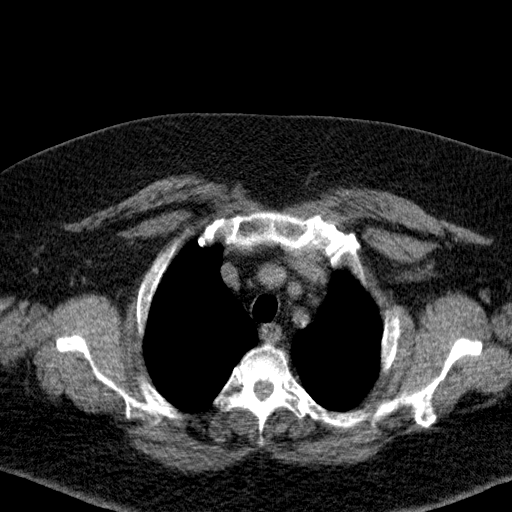
[im 54/64  lung]
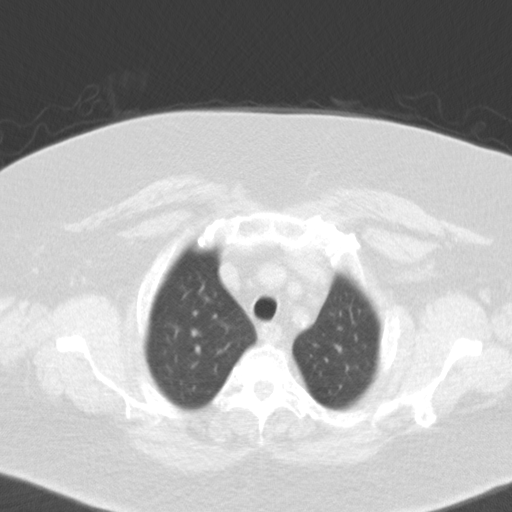
[im 59/64  lung]
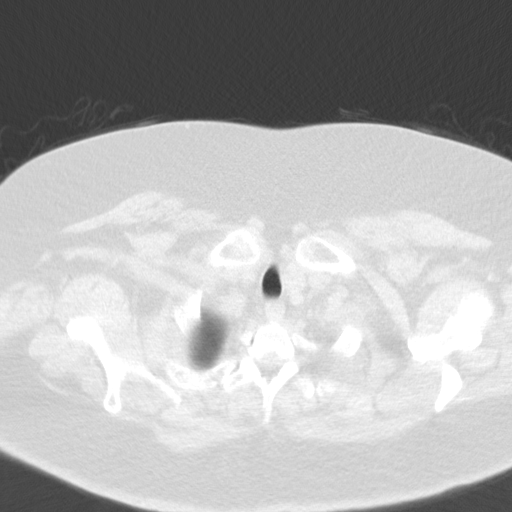

[14 of 31 positions shown; findings below may reference images not displayed]

FINDINGS: Cardiovascular: Heart size is normal. There is no significant
pericardial fluid, thickening or pericardial calcification. There is
aortic atherosclerosis, as well as atherosclerosis of the great
vessels of the mediastinum and the coronary arteries, including
calcified atherosclerotic plaque in the left main, left anterior
descending, left circumflex and right coronary arteries.
Calcifications of the aortic valve.

Mediastinum/Nodes: No pathologically enlarged mediastinal or hilar
lymph nodes. Please note that accurate exclusion of hilar adenopathy
is limited on noncontrast CT scans. Esophagus is unremarkable in
appearance. No axillary lymphadenopathy.

Lungs/Pleura: No suspicious appearing pulmonary nodules or masses
are noted. No acute consolidative airspace disease. No pleural
effusions. Diffuse bronchial wall thickening with mild centrilobular
and paraseptal emphysema.

Upper Abdomen: Aortic atherosclerosis.  Status post cholecystectomy.

Musculoskeletal: There are no aggressive appearing lytic or blastic
lesions noted in the visualized portions of the skeleton.
IMPRESSION: 1. Lung-RADS 1S, negative. Continue annual screening with low-dose
chest CT without contrast in 12 months.
2. The "S" modifier above refers to potentially clinically
significant non lung cancer related findings. Specifically, there is
aortic atherosclerosis, in addition to left main and 3 vessel
coronary artery disease. Please note that although the presence of
coronary artery calcium documents the presence of coronary artery
disease, the severity of this disease and any potential stenosis
cannot be assessed on this non-gated CT examination. Assessment for
potential risk factor modification, dietary therapy or pharmacologic
therapy may be warranted, if clinically indicated.
3. Mild diffuse bronchial wall thickening with mild centrilobular
and paraseptal emphysema; imaging findings suggestive of underlying
COPD.

Aortic Atherosclerosis (P5VAR-QO2.2) and Emphysema (P5VAR-NR9.D).

## 2023-05-05 DIAGNOSIS — M75121 Complete rotator cuff tear or rupture of right shoulder, not specified as traumatic: Secondary | ICD-10-CM | POA: Diagnosis not present

## 2023-05-10 DIAGNOSIS — Z794 Long term (current) use of insulin: Secondary | ICD-10-CM | POA: Diagnosis not present

## 2023-05-10 DIAGNOSIS — Z7984 Long term (current) use of oral hypoglycemic drugs: Secondary | ICD-10-CM | POA: Diagnosis not present

## 2023-05-10 DIAGNOSIS — E1165 Type 2 diabetes mellitus with hyperglycemia: Secondary | ICD-10-CM | POA: Diagnosis not present

## 2023-05-19 DIAGNOSIS — G8929 Other chronic pain: Secondary | ICD-10-CM | POA: Diagnosis not present

## 2023-05-19 DIAGNOSIS — M25511 Pain in right shoulder: Secondary | ICD-10-CM | POA: Diagnosis not present

## 2023-05-19 DIAGNOSIS — F17210 Nicotine dependence, cigarettes, uncomplicated: Secondary | ICD-10-CM | POA: Diagnosis not present

## 2023-05-19 DIAGNOSIS — Z79899 Other long term (current) drug therapy: Secondary | ICD-10-CM | POA: Diagnosis not present

## 2023-05-19 DIAGNOSIS — M545 Low back pain, unspecified: Secondary | ICD-10-CM | POA: Diagnosis not present

## 2023-05-19 DIAGNOSIS — R269 Unspecified abnormalities of gait and mobility: Secondary | ICD-10-CM | POA: Diagnosis not present

## 2023-05-28 DIAGNOSIS — R269 Unspecified abnormalities of gait and mobility: Secondary | ICD-10-CM | POA: Diagnosis not present

## 2023-05-28 DIAGNOSIS — M545 Low back pain, unspecified: Secondary | ICD-10-CM | POA: Diagnosis not present

## 2023-06-03 DIAGNOSIS — N1330 Unspecified hydronephrosis: Secondary | ICD-10-CM | POA: Diagnosis not present

## 2023-06-03 DIAGNOSIS — N3020 Other chronic cystitis without hematuria: Secondary | ICD-10-CM | POA: Diagnosis not present

## 2023-06-10 DIAGNOSIS — E559 Vitamin D deficiency, unspecified: Secondary | ICD-10-CM | POA: Diagnosis not present

## 2023-06-10 DIAGNOSIS — I10 Essential (primary) hypertension: Secondary | ICD-10-CM | POA: Diagnosis not present

## 2023-06-10 DIAGNOSIS — R5383 Other fatigue: Secondary | ICD-10-CM | POA: Diagnosis not present

## 2023-06-10 DIAGNOSIS — E119 Type 2 diabetes mellitus without complications: Secondary | ICD-10-CM | POA: Diagnosis not present

## 2023-06-10 DIAGNOSIS — R3129 Other microscopic hematuria: Secondary | ICD-10-CM | POA: Diagnosis not present

## 2023-06-10 DIAGNOSIS — D539 Nutritional anemia, unspecified: Secondary | ICD-10-CM | POA: Diagnosis not present

## 2023-06-10 DIAGNOSIS — M7989 Other specified soft tissue disorders: Secondary | ICD-10-CM | POA: Diagnosis not present

## 2023-06-10 DIAGNOSIS — E7800 Pure hypercholesterolemia, unspecified: Secondary | ICD-10-CM | POA: Diagnosis not present

## 2023-06-10 DIAGNOSIS — M129 Arthropathy, unspecified: Secondary | ICD-10-CM | POA: Diagnosis not present

## 2023-06-10 DIAGNOSIS — Z760 Encounter for issue of repeat prescription: Secondary | ICD-10-CM | POA: Diagnosis not present

## 2023-06-22 DIAGNOSIS — Z79899 Other long term (current) drug therapy: Secondary | ICD-10-CM | POA: Diagnosis not present

## 2023-06-22 DIAGNOSIS — G8929 Other chronic pain: Secondary | ICD-10-CM | POA: Diagnosis not present

## 2023-06-22 DIAGNOSIS — M25511 Pain in right shoulder: Secondary | ICD-10-CM | POA: Diagnosis not present

## 2023-06-22 DIAGNOSIS — M545 Low back pain, unspecified: Secondary | ICD-10-CM | POA: Diagnosis not present

## 2023-06-22 DIAGNOSIS — R269 Unspecified abnormalities of gait and mobility: Secondary | ICD-10-CM | POA: Diagnosis not present

## 2023-06-22 DIAGNOSIS — F17210 Nicotine dependence, cigarettes, uncomplicated: Secondary | ICD-10-CM | POA: Diagnosis not present

## 2023-06-23 DIAGNOSIS — K259 Gastric ulcer, unspecified as acute or chronic, without hemorrhage or perforation: Secondary | ICD-10-CM | POA: Diagnosis not present

## 2023-08-23 ENCOUNTER — Encounter: Payer: Self-pay | Admitting: Internal Medicine

## 2023-09-15 ENCOUNTER — Encounter: Payer: Self-pay | Admitting: Internal Medicine

## 2023-09-15 NOTE — Progress Notes (Signed)
Cardiology Office Note   Date:  09/22/2023   ID:  Gina [name], DOB 29-Aug-1965, MRN [id number]  PCP:  Erskine Emery, NP  Cardiologist:   Gail Vendetti [place], MD   Chief Complaint  Patient presents with   Coronary Artery Disease      History of Present Illness: Gina [name] is a 58 y.o. female who is seen for follow up CAD.  She has a history of DM type 2, HLD, HTN. Is also an active smoker. CT done for cancer screening showed calcification in the coronary arteries and aorta. She did have prior cardiac evaluation in 2014, 2016, and 2023 here with Myoview studies which were normal. Echo  done in 2018 was normal.    She was admitted in April 2024 with acute pyelonephritis. She did have a Myoview study in April 2024 that was normal. Since April she has noted more swelling in her legs. No SOB or chest pain. BNP level was normal. Labs followed at [facility]. States she was on Ozempic but this was stopped due to GI issues. Did have EGD that showed ulcers. Weight is up 18 lbs from last year.   Past Medical History:  Diagnosis Date   Anemia    Anxiety    Arthritis    Asthma    Back pain    Chronic pain    Diabetes mellitus    type 2   Dyspnea    with exersion    Eosinophilia 04/23/2014   Family history of adverse reaction to anesthesia    youngest daughter hard to wake up both daughters post op n/v   Fibromyalgia    GERD (gastroesophageal reflux disease)    Headache    migraines   History of kidney stones    2 big stones in left kidney now   History of recurrent UTIs    last uti 3 weeks ago   Hyperlipidemia    Hypertension    IBS (irritable bowel syndrome)    Obesities, morbid (HCC)    Pneumonia 06/2016   PONV (postoperative nausea and vomiting) 2004   came back after hystectomy bowels locked up on life support for 1 month    Renal disorder    Sinusitis    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 at [facility]   Umbilical hernia    Wound discharge    open  wound right leg going to wound center changes dressing qday with santyl cream quarter size wound    Past Surgical History:  Procedure Laterality Date   ABDOMINAL HYSTERECTOMY     1 ovary left   CARPAL TUNNEL RELEASE Right    CESAREAN SECTION     x 2   CHOLECYSTECTOMY     COLONOSCOPY WITH PROPOFOL N/A 12/03/2016   Procedure: COLONOSCOPY WITH PROPOFOL;  Surgeon: Sherrilyn Rist, MD;  Location: [facility];  Service: Gastroenterology;  Laterality: N/A;   CYSTECTOMY     left foot   CYSTOSCOPY WITH BIOPSY N/A 12/05/2021   Procedure: CYSTOSCOPY WITH BLADDER BIOPSY/ FULGURATION/ left bladder wall BILATERAL RETROGRADE;  Surgeon: Jerilee Field, MD;  Location: [facility];  Service: Urology;  Laterality: N/A;   CYSTOSCOPY/URETEROSCOPY/HOLMIUM LASER/STENT PLACEMENT Left 03/18/2020   Procedure: CYSTOSCOPY/LEFT RETROGRADE PYELOGRAM, STENT PLACEMENT;  Surgeon: Rene Paci, MD;  Location: [facility];  Service: Urology;  Laterality: Left;   ESOPHAGOGASTRODUODENOSCOPY (EGD) WITH PROPOFOL N/A 12/03/2016   Procedure: ESOPHAGOGASTRODUODENOSCOPY (EGD) WITH PROPOFOL;  Surgeon: Charlie Pitter  III, MD;  Location: [facility];  Service: Gastroenterology;  Laterality: N/A;   LITHOTRIPSY     MULTIPLE TOOTH EXTRACTIONS     URETEROSCOPY WITH HOLMIUM LASER LITHOTRIPSY Left 04/09/2020   Procedure: URETEROSCOPY WITH HOLMIUM LASER LITHOTRIPSY/ STENT CHANGE;  Surgeon: Jerilee Field, MD;  Location: [facility];  Service: Urology;  Laterality: Left;     Current Outpatient Medications  Medication Sig Dispense Refill   albuterol (VENTOLIN HFA) 108 (90 Base) MCG/ACT inhaler Inhale 2 puffs into the lungs every 4 (four) hours as needed for wheezing or shortness of breath.     ALPRAZolam (XANAX) 1 MG tablet Take 1 mg by mouth 2 (two) times daily as needed for anxiety.      aspirin EC 81 MG tablet Take 1 tablet (81 mg total) by mouth daily. Swallow whole. (Patient taking differently: Take 81 mg by mouth daily.) 90 tablet  3   cyclobenzaprine (FLEXERIL) 10 MG tablet Take 10 mg by mouth daily as needed for muscle spasms.     fluticasone (FLONASE) 50 MCG/ACT nasal spray Place 1-2 sprays into both nostrils daily.     fluticasone-salmeterol (WIXELA INHUB) 250-50 MCG/ACT AEPB Inhale 1 puff into the lungs 2 (two) times daily. 60 each 2   furosemide (LASIX) 40 MG tablet Take 1 tablet (40 mg total) by mouth daily.     glucose blood (ONE TOUCH ULTRA TEST) test strip USE TO TEST BLOOD SUGAR 3 TIMES DAILY 100 each 3   HYDROcodone-acetaminophen (NORCO/VICODIN) 5-325 MG tablet Take 1 tablet by mouth every 6 (six) hours as needed for moderate pain.     lisinopril (ZESTRIL) 5 MG tablet Take 5 mg by mouth daily.     magnesium oxide (MAG-OX) 400 (240 Mg) MG tablet Take 400 mg by mouth daily.     nystatin (NYSTATIN) powder Apply topically 3 (three) times daily as needed. (Patient taking differently: Apply 1 application  topically 3 (three) times daily as needed (chaffing/rashes).) 60 g 6   omeprazole (PRILOSEC) 20 MG capsule TAKE 1 CAPSULE (20 MG TOTAL) BY MOUTH DAILY BEFORE BREAKFAST. 90 capsule 2   pregabalin (LYRICA) 200 MG capsule TAKE 1 CAPSULE BY MOUTH THREE TIMES A DAY (Patient taking differently: Take 200 mg by mouth 3 (three) times daily.) 90 capsule 2   promethazine (PHENERGAN) 25 MG tablet Take 1 tablet (25 mg total) by mouth every 8 (eight) hours as needed for nausea or vomiting. 10 tablet 0   rosuvastatin (CRESTOR) 20 MG tablet Take 20 mg by mouth daily.     Vitamin D, Ergocalciferol, (DRISDOL) 1.25 MG (50000 UNIT) CAPS capsule Take 50,000 Units by mouth every 7 (seven) days.     amitriptyline (ELAVIL) 10 MG tablet Take 10 mg by mouth at bedtime.     No current facility-administered medications for this visit.    Allergies:   Bee venom, Darvocet [propoxyphene n-acetaminophen], Farxiga [dapagliflozin], Fish allergy, Morphine and codeine, Penicillins, Tape, Fentanyl, and Topiramate    Social History:  The patient   reports that she has been smoking cigarettes. She started smoking about 40 years ago. She has a 60.2 pack-year smoking history. She has never used smokeless tobacco. She reports that she does not drink alcohol and does not use drugs.   Family History:  The patient's family history includes Alcohol abuse in her brother; Cancer in her mother; Cirrhosis in her mother; Diabetes in her father; Heart failure in her father; Hypertension in her father.    ROS:  Please see the history of  present illness.   Otherwise, review of systems are positive for none.   All other systems are reviewed and negative.    PHYSICAL EXAM: VS:  BP (!) 114/54 (BP Location: Right Arm, Patient Position: Sitting, Cuff Size: Normal)   Pulse 71   Ht 5\' 3"  (1.6 m)   Wt 292 lb 12.8 oz (132.8 kg)   SpO2 94%   BMI 51.87 kg/m  , BMI Body mass index is 51.87 kg/m. GEN: Well nourished, obese, in no acute distress HEENT: normal Neck: no JVD, carotid bruits, or masses Cardiac: RRR; gr 2/6 systolic murmur apex and RUSB, no rubs, or gallops, 1+ edema  Respiratory:  clear to auscultation bilaterally, normal work of breathing GI: soft, nontender, nondistended, + BS MS: no deformity or atrophy Skin: warm and dry, no rash Neuro:  Strength and sensation are intact Psych: euthymic mood, full affect   EKG Interpretation Date/Time:  Wednesday September 22 2023 08:20:10 EST Ventricular Rate:  75 PR Interval:    QRS Duration:  100 QT Interval:  392 QTC Calculation: 437 R Axis:   -51  Text Interpretation: Normal sinus rhythm Low voltage QRS  Left anterior fascicular block  Anteroseptal infarct , old When compared with ECG of 19-Nov-2022 12:44,  no PACs seen  Confirmed by [place], Valicia Rief [phone]) on 09/22/2023 8:24:16 AM    Recent Labs: 11/19/2022: ALT 19; BUN 16; Creatinine, Ser 1.09; Hemoglobin 12.8; Platelets 238; Potassium 3.5; Sodium 132  Dated 05/20/20: cholesterol 274, triglycerides 341, HDL 42. Non HDL 232.    Lipid  Panel No results found for: "CHOL", "TRIG", "HDL", "CHOLHDL", "VLDL", "LDLCALC", "LDLDIRECT"    Wt Readings from Last 3 Encounters:  09/22/23 292 lb 12.8 oz (132.8 kg)  11/19/22 262 lb (118.8 kg)  11/09/22 265 lb (120.2 kg)      Other studies Reviewed: Additional studies/ records that were reviewed today include:   CLINICAL DATA:  58 year old female current smoker with 32 pack-year history of smoking. Lung cancer screening examination.   EXAM: CT CHEST WITHOUT CONTRAST LOW-DOSE FOR LUNG CANCER SCREENING   TECHNIQUE: Multidetector CT imaging of the chest was performed following the standard protocol without IV contrast.   COMPARISON:  No priors.   FINDINGS: Cardiovascular: Heart size is normal. There is no significant pericardial fluid, thickening or pericardial calcification. There is aortic atherosclerosis, as well as atherosclerosis of the great vessels of the mediastinum and the coronary arteries, including calcified atherosclerotic plaque in the left main, left anterior descending, left circumflex and right coronary arteries. Calcifications of the aortic valve.   Mediastinum/Nodes: No pathologically enlarged mediastinal or hilar lymph nodes. Please note that accurate exclusion of hilar adenopathy is limited on noncontrast CT scans. Esophagus is unremarkable in appearance. No axillary lymphadenopathy.   Lungs/Pleura: No suspicious appearing pulmonary nodules or masses are noted. No acute consolidative airspace disease. No pleural effusions. Diffuse bronchial wall thickening with mild centrilobular and paraseptal emphysema.   Upper Abdomen: Aortic atherosclerosis.  Status post cholecystectomy.   Musculoskeletal: There are no aggressive appearing lytic or blastic lesions noted in the visualized portions of the skeleton.   IMPRESSION: 1. Lung-RADS 1S, negative. Continue annual screening with low-dose chest CT without contrast in 12 months. 2. The "S" modifier  above refers to potentially clinically significant non lung cancer related findings. Specifically, there is aortic atherosclerosis, in addition to left main and 3 vessel coronary artery disease. Please note that although the presence of coronary artery calcium documents the presence of coronary artery disease, the severity  of this disease and any potential stenosis cannot be assessed on this non-gated CT examination. Assessment for potential risk factor modification, dietary therapy or pharmacologic therapy may be warranted, if clinically indicated. 3. Mild diffuse bronchial wall thickening with mild centrilobular and paraseptal emphysema; imaging findings suggestive of underlying COPD.   Aortic Atherosclerosis (ICD10-I70.0) and Emphysema (ICD10-J43.9).     Electronically Signed   By: Trudie [name] M.D.   On: 04/10/2021 21:36   Myoview 09/03/21: Study Highlights      The study is normal. The study is low risk.   No ST deviation was noted.   Left ventricular function is normal. End diastolic cavity size is mildly enlarged. End systolic cavity size is mildly enlarged.   Prior study available for comparison from 04/08/2015.   Normal stress nuclear study with breast attenuation but no ischemia.  Gated ejection fraction 55% with normal wall motion.  Mild left ventricular enlargement.    ASSESSMENT AND PLAN:  1.  CAD with high degree of coronary calcification. Denies chest pain or SOB. Myoview here in 2023 and in [place] in April 2024 were normal.  Continue ASA.  Aggressive risk factor modification.  2. HLD. need recent labs to review. Goal LDL < 70. Currently on Crestor 20 mg daily. Will request copy of labs from PCP.  3. HTN controlled 4. DM with neuropathy. Per PCP 5. Tobacco abuse - counseled on importance of cessation 6. Obesity. 7. Edema. Normal BNP is reassuring. Encourage sodium restriction, support hose, elevation. Continue lasix. Will update Echo. Not a candidate for SGLT  2 inhibitor due to recurrent UTIs. 8. Heart murmur. Not noted before. Will check Echo    Disposition:   FU one year if Echo OK   Signed, Merly Hinkson [place], MD  09/22/2023 8:38 AM    [facility] [address], [place], [place], [address] Phone [phone], Fax [phone]

## 2023-09-22 ENCOUNTER — Encounter: Payer: Self-pay | Admitting: Cardiology

## 2023-09-22 ENCOUNTER — Ambulatory Visit: Payer: 59 | Attending: Cardiology | Admitting: Cardiology

## 2023-09-22 ENCOUNTER — Encounter: Payer: Self-pay | Admitting: Family

## 2023-09-22 VITALS — BP 114/54 | HR 71 | Ht 63.0 in | Wt 292.8 lb

## 2023-09-22 DIAGNOSIS — I10 Essential (primary) hypertension: Secondary | ICD-10-CM | POA: Diagnosis not present

## 2023-09-22 DIAGNOSIS — E1140 Type 2 diabetes mellitus with diabetic neuropathy, unspecified: Secondary | ICD-10-CM

## 2023-09-22 DIAGNOSIS — Z720 Tobacco use: Secondary | ICD-10-CM

## 2023-09-22 DIAGNOSIS — I25118 Atherosclerotic heart disease of native coronary artery with other forms of angina pectoris: Secondary | ICD-10-CM | POA: Diagnosis not present

## 2023-09-22 DIAGNOSIS — R011 Cardiac murmur, unspecified: Secondary | ICD-10-CM

## 2023-09-22 MED ORDER — FUROSEMIDE 40 MG PO TABS: 40.0000 mg | ORAL_TABLET | Freq: Every day | ORAL | Status: AC

## 2023-09-22 NOTE — Patient Instructions (Signed)
Medication Instructions:  Continue same medications *If you need a refill on your cardiac medications before your next appointment, please call your pharmacy*   Lab Work: None ordered   Testing/Procedures: Echo  first available   Follow-Up: At [facility], you and your health needs are our priority.  As part of our continuing mission to provide you with exceptional heart care, we have created designated Provider Care Teams.  These Care Teams include your primary Cardiologist (physician) and Advanced Practice Providers (APPs -  Physician Assistants and Nurse Practitioners) who all work together to provide you with the care you need, when you need it.  We recommend signing up for the patient portal called "MyChart".  Sign up information is provided on this After Visit Summary.  MyChart is used to connect with patients for Virtual Visits (Telemedicine).  Patients are able to view lab/test results, encounter notes, upcoming appointments, etc.  Non-urgent messages can be sent to your provider as well.   To learn more about what you can do with MyChart, go to [email or web address].    Your next appointment:  1 year   Call in Oct to schedule Feb appointment     Provider:  Dr.Jordan

## 2023-10-14 ENCOUNTER — Ambulatory Visit (HOSPITAL_COMMUNITY): Payer: 59

## 2023-11-05 ENCOUNTER — Ambulatory Visit (HOSPITAL_COMMUNITY): Attending: Cardiology

## 2023-11-05 DIAGNOSIS — Z720 Tobacco use: Secondary | ICD-10-CM | POA: Diagnosis present

## 2023-11-05 DIAGNOSIS — I25118 Atherosclerotic heart disease of native coronary artery with other forms of angina pectoris: Secondary | ICD-10-CM | POA: Insufficient documentation

## 2023-11-05 DIAGNOSIS — I10 Essential (primary) hypertension: Secondary | ICD-10-CM | POA: Diagnosis not present

## 2023-11-05 DIAGNOSIS — E1140 Type 2 diabetes mellitus with diabetic neuropathy, unspecified: Secondary | ICD-10-CM | POA: Insufficient documentation

## 2023-11-05 DIAGNOSIS — R011 Cardiac murmur, unspecified: Secondary | ICD-10-CM | POA: Diagnosis present

## 2023-11-05 LAB — ECHOCARDIOGRAM COMPLETE
AR max vel: 1.12 cm2
AV Area VTI: 1.13 cm2
AV Area mean vel: 1.03 cm2
AV Mean grad: 15.5 mmHg
AV Peak grad: 28.9 mmHg
Ao pk vel: 2.69 m/s
Area-P 1/2: 3.58 cm2
S' Lateral: 3.5 cm

## 2023-11-05 MED ORDER — PERFLUTREN LIPID MICROSPHERE
3.0000 mL | INTRAVENOUS | Status: AC | PRN
Start: 2023-11-05 — End: 2023-11-05
  Administered 2023-11-05: 3 mL via INTRAVENOUS

## 2023-11-10 ENCOUNTER — Telehealth: Payer: Self-pay

## 2023-11-10 ENCOUNTER — Other Ambulatory Visit: Payer: Self-pay

## 2023-11-10 DIAGNOSIS — I350 Nonrheumatic aortic (valve) stenosis: Secondary | ICD-10-CM

## 2023-11-10 NOTE — Telephone Encounter (Signed)
 Spoke to patient recent echo results given.Advised to repeat echo in 1 year.

## 2023-11-18 ENCOUNTER — Encounter: Payer: Self-pay | Admitting: *Deleted

## 2024-05-23 ENCOUNTER — Other Ambulatory Visit: Payer: Self-pay | Admitting: Urology

## 2024-06-12 ENCOUNTER — Encounter (HOSPITAL_COMMUNITY): Payer: Self-pay | Admitting: Urology

## 2024-06-12 NOTE — Progress Notes (Signed)
 Spoke w/ via phone for pre-op interview--- [facility] Lab needs dos----BMP, A1C and CBG per anesthesia.         Lab results------Current EKG in Epic dated 09/22/23. COVID test -----patient states asymptomatic no test needed Arrive at -------0530 NPO after MN NO Solid Food.    Med rec completed Medications to take morning of surgery -----Bring Albuterol  inhaler, Xanax  and Percocet PRN. Prilosec and Lyrica .   Diabetic medication -----NONE AM of surgery. 1/2 insulin  dose night before surgery. Only take 5 units  GLP1 agonist last dose:Ozempic  0.5mg  weekly. Last dose 06/08/24. GLP1 instructions: hold any further doses until after surgery, pt verbalized understanding.  Patient instructed no nail polish to be worn day of surgery Patient instructed to bring photo id and insurance card day of surgery Patient aware to have Driver (ride ) / caregiver    for 24 hours after surgery - [name] Patient Special Instructions ----- Pre-Op special Instructions -----  Patient verbalized understanding of instructions that were given at this phone interview. Patient denies chest pain, sob, fever, cough at the interview.

## 2024-06-16 ENCOUNTER — Ambulatory Visit (HOSPITAL_COMMUNITY): Admitting: Anesthesiology

## 2024-06-16 ENCOUNTER — Encounter (HOSPITAL_COMMUNITY): Payer: Self-pay | Admitting: Urology

## 2024-06-16 ENCOUNTER — Ambulatory Visit (HOSPITAL_COMMUNITY)

## 2024-06-16 ENCOUNTER — Encounter (HOSPITAL_COMMUNITY)
Admission: RE | Disposition: A | Discharge status: Home / Self Care | Payer: Self-pay | Source: Home / Self Care | Attending: Urology

## 2024-06-16 ENCOUNTER — Ambulatory Visit (HOSPITAL_COMMUNITY)
Admission: RE | Admit: 2024-06-16 | Discharge: 2024-06-16 | Disposition: A | Discharge status: Home / Self Care | Attending: Urology | Admitting: Urology

## 2024-06-16 DIAGNOSIS — J4489 Other specified chronic obstructive pulmonary disease: Secondary | ICD-10-CM | POA: Insufficient documentation

## 2024-06-16 DIAGNOSIS — F17210 Nicotine dependence, cigarettes, uncomplicated: Secondary | ICD-10-CM

## 2024-06-16 DIAGNOSIS — E119 Type 2 diabetes mellitus without complications: Secondary | ICD-10-CM | POA: Diagnosis not present

## 2024-06-16 DIAGNOSIS — M797 Fibromyalgia: Secondary | ICD-10-CM | POA: Diagnosis not present

## 2024-06-16 DIAGNOSIS — J449 Chronic obstructive pulmonary disease, unspecified: Secondary | ICD-10-CM | POA: Diagnosis not present

## 2024-06-16 DIAGNOSIS — R310 Gross hematuria: Secondary | ICD-10-CM | POA: Diagnosis not present

## 2024-06-16 DIAGNOSIS — Z7985 Long-term (current) use of injectable non-insulin antidiabetic drugs: Secondary | ICD-10-CM | POA: Insufficient documentation

## 2024-06-16 DIAGNOSIS — E1140 Type 2 diabetes mellitus with diabetic neuropathy, unspecified: Secondary | ICD-10-CM

## 2024-06-16 DIAGNOSIS — N1330 Unspecified hydronephrosis: Secondary | ICD-10-CM | POA: Diagnosis present

## 2024-06-16 DIAGNOSIS — Z7984 Long term (current) use of oral hypoglycemic drugs: Secondary | ICD-10-CM | POA: Diagnosis not present

## 2024-06-16 DIAGNOSIS — F419 Anxiety disorder, unspecified: Secondary | ICD-10-CM | POA: Insufficient documentation

## 2024-06-16 DIAGNOSIS — I2510 Atherosclerotic heart disease of native coronary artery without angina pectoris: Secondary | ICD-10-CM

## 2024-06-16 DIAGNOSIS — Z794 Long term (current) use of insulin: Secondary | ICD-10-CM | POA: Insufficient documentation

## 2024-06-16 DIAGNOSIS — I10 Essential (primary) hypertension: Secondary | ICD-10-CM | POA: Insufficient documentation

## 2024-06-16 HISTORY — PX: CYSTOSCOPY WITH RETROGRADE PYELOGRAM, URETEROSCOPY AND STENT PLACEMENT: SHX5789

## 2024-06-16 HISTORY — PX: URETEROSCOPY: SHX842

## 2024-06-16 LAB — GLUCOSE, CAPILLARY
Glucose-Capillary: 188 mg/dL — ABNORMAL HIGH (ref 70–99)
Glucose-Capillary: 232 mg/dL — ABNORMAL HIGH (ref 70–99)

## 2024-06-16 LAB — BASIC METABOLIC PANEL WITH GFR
Anion gap: 13 (ref 5–15)
BUN: 22 mg/dL — ABNORMAL HIGH (ref 6–20)
CO2: 24 mmol/L (ref 22–32)
Calcium: 9.8 mg/dL (ref 8.9–10.3)
Chloride: 100 mmol/L (ref 98–111)
Creatinine, Ser: 1.47 mg/dL — ABNORMAL HIGH (ref 0.44–1.00)
GFR, Estimated: 41 mL/min — ABNORMAL LOW (ref >60)
Glucose, Bld: 204 mg/dL — ABNORMAL HIGH (ref 70–99)
Potassium: 4.9 mmol/L (ref 3.5–5.1)
Sodium: 137 mmol/L (ref 135–145)

## 2024-06-16 LAB — HEMOGLOBIN A1C
Hgb A1c MFr Bld: 6.8 % — ABNORMAL HIGH (ref 4.8–5.6)
Mean Plasma Glucose: 148.46 mg/dL

## 2024-06-16 SURGERY — CYSTOURETEROSCOPY, WITH RETROGRADE PYELOGRAM AND STENT INSERTION
Anesthesia: General | Site: Ureter

## 2024-06-16 MED ORDER — CIPROFLOXACIN IN D5W 400 MG/200ML IV SOLN
INTRAVENOUS | Status: AC
  Filled 2024-06-16: qty 200

## 2024-06-16 MED ORDER — HYDROMORPHONE HCL 1 MG/ML IJ SOLN
INTRAMUSCULAR | Status: AC
  Filled 2024-06-16: qty 0.5

## 2024-06-16 MED ORDER — ONDANSETRON HCL 4 MG/2ML IJ SOLN
INTRAMUSCULAR | Status: AC
  Filled 2024-06-16: qty 2

## 2024-06-16 MED ORDER — ACETAMINOPHEN 500 MG PO TABS
ORAL_TABLET | ORAL | Status: AC
  Administered 2024-06-16: 1000 mg via ORAL
  Filled 2024-06-16: qty 2

## 2024-06-16 MED ORDER — FENTANYL CITRATE (PF) 100 MCG/2ML IJ SOLN
INTRAMUSCULAR | Status: AC
  Filled 2024-06-16: qty 2

## 2024-06-16 MED ORDER — HYDROMORPHONE HCL 1 MG/ML IJ SOLN
INTRAMUSCULAR | Status: DC | PRN
  Administered 2024-06-16: .5 mg via INTRAVENOUS

## 2024-06-16 MED ORDER — IOHEXOL 300 MG/ML  SOLN
INTRAMUSCULAR | Status: DC | PRN
  Administered 2024-06-16: 45 mL

## 2024-06-16 MED ORDER — HYDROMORPHONE HCL 1 MG/ML IJ SOLN
INTRAMUSCULAR | Status: AC
  Filled 2024-06-16: qty 1

## 2024-06-16 MED ORDER — ACETAMINOPHEN 500 MG PO TABS: 1000.0000 mg | ORAL_TABLET | Freq: Once | ORAL | Status: AC

## 2024-06-16 MED ORDER — ONDANSETRON HCL 4 MG/2ML IJ SOLN
4.0000 mg | Freq: Once | INTRAMUSCULAR | Status: AC | PRN
  Administered 2024-06-16: 4 mg via INTRAVENOUS

## 2024-06-16 MED ORDER — LIDOCAINE 2% (20 MG/ML) 5 ML SYRINGE
INTRAMUSCULAR | Status: DC | PRN
  Administered 2024-06-16: 100 mg via INTRAVENOUS

## 2024-06-16 MED ORDER — SULFAMETHOXAZOLE-TRIMETHOPRIM 400-80 MG PO TABS: 1.0000 | ORAL_TABLET | Freq: Every day | ORAL | 0 refills | Status: AC

## 2024-06-16 MED ORDER — MIDAZOLAM HCL (PF) 2 MG/2ML IJ SOLN
INTRAMUSCULAR | Status: DC | PRN
  Administered 2024-06-16: 2 mg via INTRAVENOUS

## 2024-06-16 MED ORDER — LIDOCAINE 2% (20 MG/ML) 5 ML SYRINGE
INTRAMUSCULAR | Status: AC
  Filled 2024-06-16: qty 5

## 2024-06-16 MED ORDER — MIDAZOLAM HCL 2 MG/2ML IJ SOLN
INTRAMUSCULAR | Status: AC
  Filled 2024-06-16: qty 2

## 2024-06-16 MED ORDER — PROPOFOL 10 MG/ML IV BOLUS
INTRAVENOUS | Status: DC | PRN
  Administered 2024-06-16: 170 mg via INTRAVENOUS

## 2024-06-16 MED ORDER — PROPOFOL 10 MG/ML IV BOLUS
INTRAVENOUS | Status: AC
  Filled 2024-06-16: qty 20

## 2024-06-16 MED ORDER — AMISULPRIDE (ANTIEMETIC) 5 MG/2ML IV SOLN: 10.0000 mg | Freq: Once | INTRAVENOUS | Status: DC | PRN

## 2024-06-16 MED ORDER — DEXAMETHASONE SOD PHOSPHATE PF 10 MG/ML IJ SOLN
INTRAMUSCULAR | Status: DC | PRN
  Administered 2024-06-16: 5 mg via INTRAVENOUS

## 2024-06-16 MED ORDER — CHLORHEXIDINE GLUCONATE 0.12 % MT SOLN
OROMUCOSAL | Status: AC
  Administered 2024-06-16: 15 mL via OROMUCOSAL
  Filled 2024-06-16: qty 15

## 2024-06-16 MED ORDER — ONDANSETRON HCL 4 MG/2ML IJ SOLN
INTRAMUSCULAR | Status: DC | PRN
  Administered 2024-06-16: 4 mg via INTRAVENOUS

## 2024-06-16 MED ORDER — STERILE WATER FOR IRRIGATION IR SOLN
Status: DC | PRN
  Administered 2024-06-16: 3000 mL

## 2024-06-16 MED ORDER — HYDROMORPHONE HCL 1 MG/ML IJ SOLN
0.2500 mg | INTRAMUSCULAR | Status: DC | PRN
  Administered 2024-06-16 (×2): 0.25 mg via INTRAVENOUS

## 2024-06-16 MED ORDER — LACTATED RINGERS IV SOLN
INTRAVENOUS | Status: DC
Start: 2024-06-16 — End: 2024-06-16

## 2024-06-16 MED ORDER — ORAL CARE MOUTH RINSE: 15.0000 mL | Freq: Once | OROMUCOSAL | Status: AC

## 2024-06-16 MED ORDER — LIDOCAINE HCL URETHRAL/MUCOSAL 2 % EX GEL
CUTANEOUS | Status: AC
  Filled 2024-06-16: qty 22

## 2024-06-16 MED ORDER — SODIUM CHLORIDE 0.9 % IR SOLN
Status: DC | PRN
  Administered 2024-06-16 (×2): 3000 mL via INTRAVESICAL

## 2024-06-16 MED ORDER — CHLORHEXIDINE GLUCONATE 0.12 % MT SOLN: 15.0000 mL | Freq: Once | OROMUCOSAL | Status: AC

## 2024-06-16 MED ORDER — CIPROFLOXACIN IN D5W 400 MG/200ML IV SOLN
400.0000 mg | INTRAVENOUS | Status: AC
  Administered 2024-06-16: 400 mg via INTRAVENOUS

## 2024-06-16 SURGICAL SUPPLY — 24 items
BAG DRAIN URO-CYSTO SKYTR STRL (DRAIN) ×3 IMPLANT
CATH URETL OPEN 5X70 (CATHETERS) ×3 IMPLANT
CATH URETL OPEN END 6FR 70 (CATHETERS) IMPLANT
COVER DOME SNAP 22 D (MISCELLANEOUS) IMPLANT
FIBER LASER FLEXIVA 365 (UROLOGICAL SUPPLIES) ×3 IMPLANT
FIBER LASER FLEXIVA 550 (UROLOGICAL SUPPLIES) ×3 IMPLANT
FIBER LASER FLEXIVA PULSE 910 (Laser) ×3 IMPLANT
FIBER LASER TRAC TIP (UROLOGICAL SUPPLIES) ×3 IMPLANT
FIBER LASER TRACTIP 200 (UROLOGICAL SUPPLIES) ×3 IMPLANT
GAUZE SPONGE 4X4 12PLY STRL (GAUZE/BANDAGES/DRESSINGS) IMPLANT
GLOVE BIO SURGEON STRL SZ7.5 (GLOVE) ×3 IMPLANT
GOWN STRL SURGICAL XL XLNG (GOWN DISPOSABLE) ×3 IMPLANT
GUIDEWIRE ANG ZIPWIRE 038X150 (WIRE) IMPLANT
GUIDEWIRE STR DUAL SENSOR (WIRE) ×3 IMPLANT
GUIDEWIRE ZIPWRE .038 STRAIGHT (WIRE) IMPLANT
KIT TURNOVER KIT B (KITS) ×3 IMPLANT
MANIFOLD NEPTUNE II (INSTRUMENTS) ×3 IMPLANT
PACK CYSTO (CUSTOM PROCEDURE TRAY) ×3 IMPLANT
SOL .9 NS 3000ML IRR UROMATIC (IV SOLUTION) ×6 IMPLANT
SOLN 0.9% NACL POUR BTL 1000ML (IV SOLUTION) ×3 IMPLANT
STENT URET 6FRX24 CONTOUR (STENTS) IMPLANT
STENT URET 6FRX26 CONTOUR (STENTS) IMPLANT
TUBE CONNECTING 12X1/4 (SUCTIONS) ×3 IMPLANT
TUBING UROLOGY SET (TUBING) ×3 IMPLANT

## 2024-06-16 NOTE — Transfer of Care (Signed)
 Immediate Anesthesia Transfer of Care Note  Patient: Gina [name]  Procedure(s) Performed: ERNESTA, WITH RETROGRADE PYELOGRAM AND STENT INSERTION/ BLADDER BIOPSY (Bladder) URETEROSCOPY (Left: Ureter)  Patient Location: PACU  Anesthesia Type:General  Level of Consciousness: awake and sedated  Airway & Oxygen Therapy: Patient Spontanous Breathing and Patient connected to face mask oxygen  Post-op Assessment: Report given to RN and Post -op Vital signs reviewed and stable  Post vital signs: Reviewed and stable  Last Vitals:  Vitals Value Taken Time  BP 169/77 06/16/24 09:49  Temp    Pulse 71 06/16/24 09:51  Resp 15 06/16/24 09:51  SpO2 97 % 06/16/24 09:51  Vitals shown include unfiled device data.  Last Pain:  Vitals:   06/16/24 0623  PainSc: 8       Patients Stated Pain Goal: 6 (06/16/24 [id number])  Complications: No notable events documented.

## 2024-06-16 NOTE — Anesthesia Procedure Notes (Addendum)
 Procedure Name: LMA Insertion Date/Time: 06/16/2024 8:11 AM  Performed by: Jorey Dollard C, CRNAPre-anesthesia Checklist: Patient identified, Emergency Drugs available, Suction available and Patient being monitored Patient Re-evaluated:Patient Re-evaluated prior to induction Oxygen Delivery Method: Circle System Utilized Preoxygenation: Pre-oxygenation with 100% oxygen Induction Type: IV induction Ventilation: Mask ventilation without difficulty LMA: LMA inserted LMA Size: 4.0 Number of attempts: 1 Airway Equipment and Method: Bite block Placement Confirmation: positive ETCO2 Tube secured with: Tape Dental Injury: Teeth and Oropharynx as per pre-operative assessment

## 2024-06-16 NOTE — Discharge Instructions (Addendum)
 Removal of the stent: We will plan to remove the stent when he follow-up in the office next week.  No acetaminophen /Tylenol  until after 1:30pm today if needed for pain.  Post Anesthesia Home Care Instructions  Activity: Get plenty of rest for the remainder of the day. A responsible individual must stay with you for 24 hours following the procedure.  For the next 24 hours, DO NOT: -Drive a car -Advertising copywriter -Drink alcoholic beverages -Take any medication unless instructed by your physician -Make any legal decisions or sign important papers.  Meals: Start with liquid foods such as gelatin or soup. Progress to regular foods as tolerated. Avoid greasy, spicy, heavy foods. If nausea and/or vomiting occur, drink only clear liquids until the nausea and/or vomiting subsides. Call your physician if vomiting continues.  Special Instructions/Symptoms: Your throat may feel dry or sore from the anesthesia or the breathing tube placed in your throat during surgery. If this causes discomfort, gargle with warm salt water . The discomfort should disappear within 24 hours.

## 2024-06-16 NOTE — Anesthesia Preprocedure Evaluation (Addendum)
 Anesthesia Evaluation  Patient identified by MRN, date of birth, ID band Patient awake    Reviewed: Allergy & Precautions, NPO status , Patient's Chart, lab work & pertinent test results  History of Anesthesia Complications (+) PONV and history of anesthetic complications  Airway Mallampati: II  TM Distance: >3 FB Neck ROM: Full    Dental  (+) Dental Advisory Given, Missing, Poor Dentition   Pulmonary asthma , COPD,  COPD inhaler, Current Smoker and Patient abstained from smoking.   Pulmonary exam normal breath sounds clear to auscultation       Cardiovascular hypertension, Pt. on medications (-) angina + CAD  (-) Past MI Normal cardiovascular exam Rhythm:Regular Rate:Normal     Neuro/Psych  Headaches PSYCHIATRIC DISORDERS Anxiety      Neuromuscular disease    GI/Hepatic Neg liver ROS,GERD  Medicated,,  Endo/Other  diabetes, Type 2, Oral Hypoglycemic Agents, Insulin  Dependent  Class 4 obesity  Renal/GU Renal disease (Hydroureteronephrosis)     Musculoskeletal  (+) Arthritis ,  Fibromyalgia -  Abdominal   Peds  Hematology negative hematology ROS (+)   Anesthesia Other Findings Day of surgery medications reviewed with the patient.  Reproductive/Obstetrics                              Anesthesia Physical Anesthesia Plan  ASA: 4  Anesthesia Plan: General   Post-op Pain Management: Tylenol  PO (pre-op)*   Induction: Intravenous  PONV Risk Score and Plan: 4 or greater and Midazolam , Dexamethasone  and Ondansetron   Airway Management Planned: LMA  Additional Equipment:   Intra-op Plan:   Post-operative Plan: Extubation in OR  Informed Consent: I have reviewed the patients History and Physical, chart, labs and discussed the procedure including the risks, benefits and alternatives for the proposed anesthesia with the patient or authorized representative who has indicated his/her  understanding and acceptance.     Dental advisory given  Plan Discussed with: CRNA  Anesthesia Plan Comments:          Anesthesia Quick Evaluation

## 2024-06-16 NOTE — H&P (Signed)
 H&P  Chief Complaint: Bladder pain, gross hematuria, left hydronephrosis  History of Present Illness: Gina [name] is a 58 year old female with a history of bladder pain, frequency, left hydronephrosis.  She was evaluated for bladder pain and gross hematuria in 2023 which was benign and consistent more with inflammation.  She is continue to have progressive symptoms.  She had an April 2024 CT scan that showed mild left hydronephrosis consistent with possibly infection or reflux.  She had cystoscopy in the office in 2024 which showed some mild bladder erythema but cytology was negative x 2.  She had another recent episode of bladder pain and frequency and again 05/06/2024 CT scan revealed left hydronephrosis. Urine culture in the office was mixed with 25-50 K growth.  Cytology was negative.  No malignancy or dysplasia noted.  She was brought today for repeat evaluation with cystoscopy, possible bladder biopsy, bilateral retrograde pyelogram, possible stent or Foley catheter.  She has typical symptoms today of bladder pain, urinary frequency, gross hematuria, some urine odor.  The symptoms do not seem to change with antibiotics.  She has no fever.  She had some congestion but her lungs are clear per anesthesia record and when discussing with CRNA.   Past Medical History:  Diagnosis Date   Anemia    Anxiety    Arthritis    Asthma    Back pain    Chronic pain    Diabetes mellitus    type 2   Dyspnea    with exersion    Eosinophilia 04/23/2014   Family history of adverse reaction to anesthesia    youngest daughter hard to wake up both daughters post op n/v   Fibromyalgia    GERD (gastroesophageal reflux disease)    Headache    migraines   History of kidney stones    2 big stones in left kidney now   History of recurrent UTIs    last uti 3 weeks ago   Hyperlipidemia    Hypertension    IBS (irritable bowel syndrome)    Obesities, morbid (HCC)    Pneumonia 06/2016   PONV (postoperative nausea  and vomiting) 2004   came back after hystectomy bowels locked up on life support for 1 month    Renal disorder    Sinusitis    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 at [facility]   Umbilical hernia    Wound discharge    open wound right leg going to wound center changes dressing qday with santyl cream quarter size wound   Past Surgical History:  Procedure Laterality Date   ABDOMINAL HYSTERECTOMY     1 ovary left   CARPAL TUNNEL RELEASE Right    CESAREAN SECTION     x 2   CHOLECYSTECTOMY     COLONOSCOPY WITH PROPOFOL  N/A 12/03/2016   Procedure: COLONOSCOPY WITH PROPOFOL ;  Surgeon: Victory LITTIE Legrand DOUGLAS, MD;  Location: [facility];  Service: Gastroenterology;  Laterality: N/A;   CYSTECTOMY     left foot   CYSTOSCOPY WITH BIOPSY N/A 12/05/2021   Procedure: CYSTOSCOPY WITH BLADDER BIOPSY/ FULGURATION/ left bladder wall BILATERAL RETROGRADE;  Surgeon: Nieves Cough, MD;  Location: [facility];  Service: Urology;  Laterality: N/A;   CYSTOSCOPY/URETEROSCOPY/HOLMIUM LASER/STENT PLACEMENT Left 03/18/2020   Procedure: CYSTOSCOPY/LEFT RETROGRADE PYELOGRAM, STENT PLACEMENT;  Surgeon: Devere Lonni Righter, MD;  Location: [facility];  Service: Urology;  Laterality: Left;   ESOPHAGOGASTRODUODENOSCOPY (EGD) WITH PROPOFOL  N/A 12/03/2016   Procedure: ESOPHAGOGASTRODUODENOSCOPY (EGD) WITH PROPOFOL ;  Surgeon: Victory LITTIE Legrand DOUGLAS, MD;  Location: [facility];  Service: Gastroenterology;  Laterality: N/A;   LITHOTRIPSY     MULTIPLE TOOTH EXTRACTIONS     URETEROSCOPY WITH HOLMIUM LASER LITHOTRIPSY Left 04/09/2020   Procedure: URETEROSCOPY WITH HOLMIUM LASER LITHOTRIPSY/ STENT CHANGE;  Surgeon: Nieves Cough, MD;  Location: [facility];  Service: Urology;  Laterality: Left;    Home Medications:  Medications Prior to Admission  Medication Sig Dispense Refill Last Dose/Taking   acetaminophen  (TYLENOL ) 500 MG tablet Take 1,000 mg by mouth every 6 (six) hours as needed.   06/15/2024   albuterol  (VENTOLIN  HFA) 108  (90 Base) MCG/ACT inhaler Inhale 2 puffs into the lungs every 4 (four) hours as needed for wheezing or shortness of breath.   06/16/2024 at  5:30 AM   ALPRAZolam  (XANAX ) 1 MG tablet Take 1 mg by mouth 2 (two) times daily as needed for anxiety.    06/16/2024 at  4:30 AM   fluticasone  (FLONASE ) 50 MCG/ACT nasal spray Place 1-2 sprays into both nostrils daily.   Past Week   furosemide  (LASIX ) 40 MG tablet Take 1 tablet (40 mg total) by mouth daily.   Past Week   glipiZIDE (GLUCOTROL XL) 5 MG 24 hr tablet Take 5 mg by mouth daily with breakfast.   06/16/2024 at  4:30 AM   Insulin  Aspart (NOVOLOG  PENFILL [place]) Inject 10 Units into the skin at bedtime.   Past Week   lisinopril  (ZESTRIL ) 5 MG tablet Take 5 mg by mouth daily.   06/15/2024   magnesium  oxide (MAG-OX) 400 (240 Mg) MG tablet Take 400 mg by mouth daily.   06/15/2024   nystatin  (NYSTATIN ) powder Apply topically 3 (three) times daily as needed. (Patient taking differently: Apply 1 application  topically 3 (three) times daily as needed (chaffing/rashes).) 60 g 6 06/15/2024   omeprazole  (PRILOSEC) 20 MG capsule TAKE 1 CAPSULE (20 MG TOTAL) BY MOUTH DAILY BEFORE BREAKFAST. 90 capsule 2 06/15/2024   oxyCODONE -acetaminophen  (PERCOCET/ROXICET) 5-325 MG tablet Take 1 tablet by mouth every 4 (four) hours as needed for severe pain (pain score 7-10).   06/16/2024 at  4:30 AM   pregabalin  (LYRICA ) 200 MG capsule TAKE 1 CAPSULE BY MOUTH THREE TIMES A DAY (Patient taking differently: Take 150 mg by mouth 3 (three) times daily.) 90 capsule 2 06/16/2024 at  4:30 AM   rosuvastatin  (CRESTOR ) 20 MG tablet Take 20 mg by mouth daily.   06/15/2024   Semaglutide  (OZEMPIC , 0.25 OR 0.5 MG/DOSE, [place]) Inject into the skin once a week.   06/08/2024   Vitamin D, Ergocalciferol, (DRISDOL) 1.25 MG (50000 UNIT) CAPS capsule Take 50,000 Units by mouth every 7 (seven) days.   Past Month   amitriptyline  (ELAVIL ) 10 MG tablet Take 10 mg by mouth at bedtime. (Patient not taking: Reported on  06/12/2024)   Not Taking   aspirin  EC 81 MG tablet Take 1 tablet (81 mg total) by mouth daily. Swallow whole. (Patient not taking: Reported on 06/12/2024) 90 tablet 3 Not Taking   cyclobenzaprine  (FLEXERIL ) 10 MG tablet Take 10 mg by mouth daily as needed for muscle spasms.   More than a month   fluticasone -salmeterol (WIXELA INHUB ) 250-50 MCG/ACT AEPB Inhale 1 puff into the lungs 2 (two) times daily. (Patient not taking: Reported on 06/12/2024) 60 each 2 Not Taking   glucose blood (ONE TOUCH ULTRA TEST) test strip USE TO TEST BLOOD SUGAR 3 TIMES DAILY 100 each 3    HYDROcodone -acetaminophen  (NORCO/VICODIN) 5-325 MG tablet Take  1 tablet by mouth every 6 (six) hours as needed for moderate pain. (Patient not taking: Reported on 06/12/2024)   Not Taking   promethazine  (PHENERGAN ) 25 MG tablet Take 1 tablet (25 mg total) by mouth every 8 (eight) hours as needed for nausea or vomiting. 10 tablet 0 More than a month   Allergies:  Allergies  Allergen Reactions   Bee Venom Anaphylaxis   Darvocet [Propoxyphene N-Acetaminophen ] Nausea And Vomiting   Farxiga [Dapagliflozin] Other (See Comments)    Cramping, bleeding, messed bladder up    Fish Allergy Anaphylaxis   Morphine  And Codeine Itching and Other (See Comments)    I go crazy   Penicillins Hives, Itching, Swelling and Other (See Comments)    Tolerates Rocephin   Swelling all over body  Has patient had a PCN reaction causing immediate rash, facial/tongue/throat swelling, SOB or lightheadedness with hypotension: Yes Has patient had a PCN reaction causing severe rash involving mucus membranes or skin necrosis: Yes Has patient had a PCN reaction that required hospitalization Unknown Has patient had a PCN reaction occurring within the last 10 years: No  If all of the above answers are NO, then may proceed with Cephalosporin use.    Tape Hives and Other (See Comments)    Adhesive tape   Fentanyl  Nausea Only and Other (See Comments)    I go crazy    Latex Rash   Topiramate Other (See Comments)    Gi intolerance     Family History  Problem Relation Age of Onset   Cancer Mother    Cirrhosis Mother    Heart failure Father    Diabetes Father    Hypertension Father    Alcohol abuse Brother    Colon cancer Neg Hx    Social History:  reports that she has been smoking cigarettes. She started smoking about 40 years ago. She has a 61.3 pack-year smoking history. She has never used smokeless tobacco. She reports that she does not drink alcohol and does not use drugs.  ROS: A complete review of systems was performed.  All systems are negative except for pertinent findings as noted. Review of Systems  All other systems reviewed and are negative.    Physical Exam:  Vital signs in last 24 hours: Temp:  [98 F (36.7 C)] 98 F (36.7 C) (11/07 0615) Pulse Rate:  [65] 65 (11/07 0615) Resp:  [14] 14 (11/07 0615) BP: (129)/(42) 129/42 (11/07 0615) SpO2:  [95 %] 95 % (11/07 0615) General:  Alert and oriented, No acute distress HEENT: Normocephalic, atraumatic Cardiovascular: Regular rate and rhythm Lungs: Regular rate and effort Abdomen: Soft, nontender, nondistended, no abdominal masses Back: No CVA tenderness Extremities: No edema Neurologic: Grossly intact  Laboratory Data:  Results for orders placed or performed during the hospital encounter of 06/16/24 (from the past 24 hours)  Hemoglobin A1c per protocol     Status: Abnormal   Collection Time: 06/16/24  6:36 AM  Result Value Ref Range   Hgb A1c MFr Bld 6.8 (H) 4.8 - 5.6 %   Mean Plasma Glucose 148.46 mg/dL  Glucose, capillary     Status: Abnormal   Collection Time: 06/16/24  6:52 AM  Result Value Ref Range   Glucose-Capillary 188 (H) 70 - 99 mg/dL   No results found for this or any previous visit (from the past 240 hours). Creatinine: No results for input(s): CREATININE in the last 168 hours.  Impression/Assessment:  Gross hematuria, frequency, bladder pain, left  hydronephrosis-  Plan:  I discussed with the patient the nature, potential benefits, risks and alternatives to exam under anesthesia, cystoscopy, possible bladder biopsy, bilateral retrograde pyelogram, possible ureteral stent or Foley, including side effects of the proposed treatment, the likelihood of the patient achieving the goals of the procedure, and any potential problems that might occur during the procedure or recuperation. All questions answered. Patient elects to proceed.  Gina [name] 06/16/2024, 7:47 AM

## 2024-06-16 NOTE — Op Note (Signed)
 Preoperative diagnosis: Gross hematuria, bladder pain, frequency, left hydronephrosis  Postoperative diagnosis: Same  Procedure: Cystoscopy, bilateral retrograde pyelogram, diagnostic left ureteroscopy, bladder biopsy and fulguration up to 0.5 cm, left ureteral stent placement  Surgeon: Nieves  Anesthesia: General  Indication for procedure: Gina [name] is a 58 year old female with a history of bladder pain frequency urgency gross hematuria and left hydronephrosis.  She brought today for exam and endoscopic evaluation under anesthesia.  Findings: On exam the vulva appeared normal without lesion.  The meatus appeared normal.  The vagina appeared normal.  There was no prolapse.  On cystoscopy there was diffuse erythema, edema of the bladder especially posterior right.  The trigone and ureteral orifices appeared edematous.  There was no stone or foreign body in the bladder.  There is no mucosal papillary lesions.  As the bladder filled and emptied the bladder mucosa bladder specially right posterior.  Bladder capacity seemed adequate but nearing capacity she did leak around the scope and if the scope was removed she voided.  Left retrograde pyelogram-there was narrowing at the UVJ and then hydroureteronephrosis above this point.  The distal ureter went lateral and then came back medial indicating some tortuosity.  [place]out was slow but there was efflux of contrast.  Right retrograde  pyelogram-she had a single ureter single collecting system unit without filling defect, stricture or dilation.  There was good drainage.  Left ureteroscopy outline edema and narrowing at the UVJ and proximal to this the intramural tunnel and distal ureter appeared normal but dilated.  No mucosal lesions.  Culture and cytology was obtained.  A digital scope was passed up to the proximal ureter which again appeared dilated but normal and then the collecting system was visualized and appeared normal.  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  What appeared to be the upper pole required a downward deflection to visualized endoscopically and what appeared to be the lower pole required an upward deflection.  I did not see any stones or mucosal lesions in the collecting system.  Description of procedure: After consent was obtained patient brought to the operating room.  After adequate anesthesia she is placed in lithotomy position and prepped and draped in the usual sterile fashion.  the pannus needed to be retracted to get access to the vulva and meatus.  Cystoscope was passed per urethra and the bladder inspected with a 30 and 70 degree lens.  The left ureteral orifice was cannulated with a 6 French open-ended catheter left retrograde injection of contrast was performed.  The right ureteral orifice was cannulated with a 6 French open-ended catheter and right retrograde injection of contrast was performed.  The bladder was drained and the scope removed.  I took a short semirigid ureteroscope and using a no touch technique was able to access the left ureteral orifice and go up into the distal and into the mid ureter.  I backed down into the distal ureter which was widely dilated.  The urine was drained and collected.  The system was refilled and then drained again and collected in the same specimen cup and sent for culture and cytology from the left system.  I then passed a sensor wire and backed the semirigid scope out.  A single channel digital ureteroscope was passed up into the left collecting system over the wire.  The wire was removed.  The collecting system was inspected.  I then passed the wire back into the collecting system and backed the scope out and inspected the ureter  on the way out and again no lesions were noted.  The wire was backloaded on the cystoscope and a 6 x 24 cm stent was advanced.  The wire was removed with a good coil seen up in the kidney and a good coil in the bladder.  The string was left on the stent and  placed on the other side of the drape.  Cystoscope was passed per urethra back into the bladder and the right posterior and posterior bladder was biopsied and fulgurated.  Other areas that were oozing posteriorly were fulgurated.  This was as clear as a visual as we had a hat during the case.  Again bladder inspected and saw no worrisome lesions noted.  Hemostasis was excellent at low pressure.  The scope was backed out.  The drapes were removed.  The string was tucked in the vagina.  She was awakened taken the cover room in stable condition.  Complications: None  Blood loss: Minimal  Specimens to pathology: #1 left catheterized ureter/collecting system urine culture #2 left catheterized ureter/collecting systems urine cytology #3 right bladder biopsy #4 posterior bladder biopsy  Drains: 6 x 24 cm left ureteral stent  Disposition: Patient stable to PACU-I discussed the findings, procedure postop care follow-up with Norleen Anes.

## 2024-06-17 LAB — URINE CULTURE: Culture: 10000 — AB

## 2024-06-19 ENCOUNTER — Encounter (HOSPITAL_COMMUNITY): Payer: Self-pay | Admitting: Urology

## 2024-06-19 LAB — SURGICAL PATHOLOGY

## 2024-06-19 LAB — CYTOLOGY - NON PAP

## 2024-06-19 NOTE — Anesthesia Postprocedure Evaluation (Signed)
 Anesthesia Post Note  Patient: Gina [name]  Procedure(s) Performed: ERNESTA, WITH RETROGRADE PYELOGRAM AND STENT INSERTION/ BLADDER BIOPSY (Bladder) URETEROSCOPY (Left: Ureter)     Patient location during evaluation: PACU Anesthesia Type: General Level of consciousness: awake and alert Pain management: pain level controlled Vital Signs Assessment: post-procedure vital signs reviewed and stable Respiratory status: spontaneous breathing, nonlabored ventilation and respiratory function stable Cardiovascular status: blood pressure returned to baseline and stable Postop Assessment: no apparent nausea or vomiting Anesthetic complications: no   No notable events documented.  Last Vitals:  Vitals:   06/16/24 1209 06/16/24 1215  BP: (!) 158/61 106/67  Pulse: 85 73  Resp: 18 20  Temp: 36.6 C 36.6 C  SpO2: 93% 91%    Last Pain:  Vitals:   06/16/24 1015  PainSc: 9                  Garnette FORBES Skillern

## 2024-09-25 ENCOUNTER — Ambulatory Visit: Admitting: Cardiology

## 2024-11-06 ENCOUNTER — Ambulatory Visit (HOSPITAL_COMMUNITY)
